# Patient Record
Sex: Female | Born: 1965
Health system: Southern US, Community
[De-identification: ages and names within clinical notes are randomized; demographics above are authoritative.]

## PROBLEM LIST (undated history)

## (undated) DIAGNOSIS — N289 Disorder of kidney and ureter, unspecified: Secondary | ICD-10-CM

## (undated) DIAGNOSIS — F32A Depression, unspecified: Secondary | ICD-10-CM

## (undated) DIAGNOSIS — R112 Nausea with vomiting, unspecified: Secondary | ICD-10-CM

## (undated) DIAGNOSIS — K649 Unspecified hemorrhoids: Secondary | ICD-10-CM

## (undated) DIAGNOSIS — I10 Essential (primary) hypertension: Secondary | ICD-10-CM

## (undated) DIAGNOSIS — C539 Malignant neoplasm of cervix uteri, unspecified: Secondary | ICD-10-CM

## (undated) DIAGNOSIS — Z9889 Other specified postprocedural states: Secondary | ICD-10-CM

## (undated) DIAGNOSIS — K219 Gastro-esophageal reflux disease without esophagitis: Secondary | ICD-10-CM

## (undated) DIAGNOSIS — K5900 Constipation, unspecified: Secondary | ICD-10-CM

## (undated) DIAGNOSIS — F329 Major depressive disorder, single episode, unspecified: Secondary | ICD-10-CM

## (undated) DIAGNOSIS — F419 Anxiety disorder, unspecified: Secondary | ICD-10-CM

## (undated) HISTORY — DX: Constipation, unspecified: K59.00

## (undated) HISTORY — PX: HERNIA REPAIR: SHX51

## (undated) HISTORY — DX: Nausea with vomiting, unspecified: R11.2

## (undated) HISTORY — PX: CHOLECYSTECTOMY: SHX55

## (undated) HISTORY — DX: Unspecified hemorrhoids: K64.9

---

## 2006-05-29 ENCOUNTER — Emergency Department: Payer: Self-pay | Admitting: Emergency Medicine

## 2008-12-30 ENCOUNTER — Emergency Department: Payer: Self-pay

## 2013-06-05 ENCOUNTER — Emergency Department: Payer: Self-pay | Admitting: Emergency Medicine

## 2013-06-05 LAB — BASIC METABOLIC PANEL
Anion Gap: 6 — ABNORMAL LOW (ref 7–16)
Calcium, Total: 8.7 mg/dL (ref 8.5–10.1)
Chloride: 110 mmol/L — ABNORMAL HIGH (ref 98–107)
Co2: 27 mmol/L (ref 21–32)
Creatinine: 0.81 mg/dL (ref 0.60–1.30)
EGFR (African American): >60
EGFR (Non-African Amer.): >60
Glucose: 115 mg/dL — ABNORMAL HIGH (ref 65–99)
Osmolality: 285 (ref 275–301)
Potassium: 3.3 mmol/L — ABNORMAL LOW (ref 3.5–5.1)
Sodium: 143 mmol/L (ref 136–145)

## 2013-06-05 LAB — CBC
HCT: 39.3 % (ref 35.0–47.0)
MCH: 26.2 pg (ref 26.0–34.0)
MCHC: 32.9 g/dL (ref 32.0–36.0)
MCV: 80 fL (ref 80–100)
Platelet: 290 10*3/uL (ref 150–440)
RBC: 4.95 10*6/uL (ref 3.80–5.20)
RDW: 14.9 % — ABNORMAL HIGH (ref 11.5–14.5)
WBC: 8.8 10*3/uL (ref 3.6–11.0)

## 2016-01-17 ENCOUNTER — Emergency Department: Payer: Self-pay

## 2016-01-17 ENCOUNTER — Encounter: Payer: Self-pay | Admitting: Emergency Medicine

## 2016-01-17 ENCOUNTER — Emergency Department
Admission: EM | Admit: 2016-01-17 | Discharge: 2016-01-17 | Disposition: A | Discharge status: Home / Self Care | Payer: Self-pay | Attending: Emergency Medicine | Admitting: Emergency Medicine

## 2016-01-17 DIAGNOSIS — J069 Acute upper respiratory infection, unspecified: Secondary | ICD-10-CM | POA: Insufficient documentation

## 2016-01-17 LAB — BASIC METABOLIC PANEL
Anion gap: 5 (ref 5–15)
BUN: 8 mg/dL (ref 6–20)
CHLORIDE: 102 mmol/L (ref 101–111)
CO2: 29 mmol/L (ref 22–32)
CREATININE: 0.68 mg/dL (ref 0.44–1.00)
Calcium: 8.9 mg/dL (ref 8.9–10.3)
GFR calc non Af Amer: >60 mL/min (ref >60)
Glucose, Bld: 95 mg/dL (ref 65–99)
POTASSIUM: 3.9 mmol/L (ref 3.5–5.1)
Sodium: 136 mmol/L (ref 135–145)

## 2016-01-17 LAB — CBC
HEMATOCRIT: 43 % (ref 35.0–47.0)
HEMOGLOBIN: 13.9 g/dL (ref 12.0–16.0)
MCH: 25.1 pg — AB (ref 26.0–34.0)
MCHC: 32.4 g/dL (ref 32.0–36.0)
MCV: 77.5 fL — AB (ref 80.0–100.0)
PLATELETS: 289 10*3/uL (ref 150–440)
RBC: 5.54 MIL/uL — AB (ref 3.80–5.20)
RDW: 14.8 % — ABNORMAL HIGH (ref 11.5–14.5)
WBC: 8.7 10*3/uL (ref 3.6–11.0)

## 2016-01-17 LAB — TROPONIN I

## 2016-01-17 MED ORDER — ACETAMINOPHEN 500 MG PO TABS
ORAL_TABLET | ORAL | Status: AC
  Filled 2016-01-17: qty 2

## 2016-01-17 MED ORDER — ACETAMINOPHEN 500 MG PO TABS
1000.0000 mg | ORAL_TABLET | Freq: Once | ORAL | Status: AC
  Administered 2016-01-17: 1000 mg via ORAL

## 2016-01-17 MED ORDER — IPRATROPIUM-ALBUTEROL 0.5-2.5 (3) MG/3ML IN SOLN
3.0000 mL | Freq: Once | RESPIRATORY_TRACT | Status: AC
  Administered 2016-01-17: 3 mL via RESPIRATORY_TRACT
  Filled 2016-01-17: qty 3

## 2016-01-17 MED ORDER — ALBUTEROL SULFATE HFA 108 (90 BASE) MCG/ACT IN AERS: 2.0000 | INHALATION_SPRAY | Freq: Four times a day (QID) | RESPIRATORY_TRACT | Status: DC | PRN

## 2016-01-17 MED ORDER — IPRATROPIUM-ALBUTEROL 0.5-2.5 (3) MG/3ML IN SOLN
RESPIRATORY_TRACT | Status: AC
  Filled 2016-01-17: qty 3

## 2016-01-17 NOTE — ED Notes (Signed)
Says she has resp illness with congestion andcough.  Says fever at home.  Lack of appetitie.  Nausea--no vomiting, just phlegm comes up and her ankles are swollen.

## 2016-01-17 NOTE — ED Provider Notes (Signed)
[facility] Emergency Department Provider Note  ____________________________________________    I have reviewed the triage vital signs and the nursing notes.   HISTORY  Chief Complaint Cough; Generalized Body Aches; and Nasal Congestion    HPI Patricia Rojas is a 50 y.o. female presents with complaints of nasal congestion and cough. Subjective fever at home. She reports fatigue and low energy. She denies chest pain. She reports her coworkers are sick with similar symptoms. No recent travel.     History reviewed. No pertinent past medical history.  There are no active problems to display for this patient.   No past surgical history on file.  Current Outpatient Rx  Name  Route  Sig  Dispense  Refill  . albuterol (PROVENTIL HFA;VENTOLIN HFA) 108 (90 Base) MCG/ACT inhaler   Inhalation   Inhale 2 puffs into the lungs every 6 (six) hours as needed for wheezing or shortness of breath.   1 Inhaler   2     Allergies Review of patient's allergies indicates no known allergies.  No family history on file.  Social History Social History  Substance Use Topics  . Smoking status: Never Smoker   . Smokeless tobacco: None  . Alcohol Use: No    Review of Systems  Constitutional: Subjective fever Eyes: Negative for discharge ENT: Negative for sore throat Cardiovascular: Negative for chest pain. Respiratory: Negative for shortness of breath. Gastrointestinal: Negative for abdominal pain, vomiting and diarrhea. Genitourinary: Negative for dysuria. Musculoskeletal: Negative for myalgias Skin: Negative for rash. Neurological: Negative for headaches      ____________________________________________   PHYSICAL EXAM:  VITAL SIGNS: ED Triage Vitals  Enc Vitals Group     BP 01/17/16 1331 223/122 mmHg     Pulse Rate 01/17/16 1331 103     Resp --      Temp 01/17/16 1331 98.4 F (36.9 C)     Temp Source 01/17/16 1331 Oral     SpO2 01/17/16 1331 95  %     Weight 01/17/16 1331 280 lb (127.007 kg)     Height 01/17/16 1331 5\' 5"  (1.651 m)     Head Cir --      Peak Flow --      Pain Score 01/17/16 1332 9     Pain Loc --      Pain Edu? --      Excl. in [place]? --      Constitutional: Alert and oriented. Well appearing and in no distress. Eyes: Conjunctivae are normal.  ENT   Head: Normocephalic and atraumatic.   Mouth/Throat: Mucous membranes are moist. Cardiovascular: Normal rate, regular rhythm. Normal and symmetric distal pulses are present in all extremities.  Respiratory: Normal respiratory effort without tachypnea nor retractions. Breath sounds are clear and equal bilaterally.  Gastrointestinal: Soft and non-tender in all quadrants. No distention. There is no CVA tenderness. Genitourinary: deferred Musculoskeletal: Nontender with normal range of motion in all extremities. No lower extremity tenderness nor edema. Neurologic:  Normal speech and language. No gross focal neurologic deficits are appreciated. Skin:  Skin is warm, dry and intact. No rash noted. Psychiatric: Mood and affect are normal. Patient exhibits appropriate insight and judgment.  ____________________________________________    LABS (pertinent positives/negatives)  Labs Reviewed  CBC - Abnormal; Notable for the following:    RBC 5.54 (*)    MCV 77.5 (*)    MCH 25.1 (*)    RDW 14.8 (*)    All other components within normal limits  BASIC METABOLIC PANEL  TROPONIN I    ____________________________________________   EKG  ED ECG REPORT I, Lavonia Drafts, the attending physician, personally viewed and interpreted this ECG.  Date: 01/17/2016 EKG Time: 1:39 PM Rate: 102 Rhythm: Sinus tachycardia QRS Axis: normal Intervals: normal ST/T Wave abnormalities: normal Conduction Disturbances: none Narrative Interpretation: unremarkable   ____________________________________________    RADIOLOGY I have personally reviewed any xrays that were  ordered on this patient: Chest x-ray unremarkable, notified patient of 9 mm nodule  ____________________________________________   PROCEDURES  Procedure(s) performed: none  Critical Care performed:none  ____________________________________________   INITIAL IMPRESSION / ASSESSMENT AND PLAN / ED COURSE  Pertinent labs & imaging results that were available during my care of the patient were reviewed by me and considered in my medical decision making (see chart for details).  Patient well-appearing and presents with symptoms of upper respiratory infection. She does have a history of hypertension. Her lab work is unremarkable. Chest x-ray is normal. Recommend symptomatic care and PCP follow-up for better blood pressure control  ____________________________________________   FINAL CLINICAL IMPRESSION(S) / ED DIAGNOSES  Final diagnoses:  Upper respiratory infection     Lavonia Drafts, MD 01/17/16 2340

## 2016-01-17 NOTE — Discharge Instructions (Signed)
Upper Respiratory Infection, Adult Most upper respiratory infections (URIs) are a viral infection of the air passages leading to the lungs. A URI affects the nose, throat, and upper air passages. The most common type of URI is nasopharyngitis and is typically referred to as "the common cold." URIs run their course and usually go away on their own. Most of the time, a URI does not require medical attention, but sometimes a bacterial infection in the upper airways can follow a viral infection. This is called a secondary infection. Sinus and middle ear infections are common types of secondary upper respiratory infections. Bacterial pneumonia can also complicate a URI. A URI can worsen asthma and chronic obstructive pulmonary disease (COPD). Sometimes, these complications can require emergency medical care and may be life threatening.  CAUSES Almost all URIs are caused by viruses. A virus is a type of germ and can spread from one person to another.  RISKS FACTORS You may be at risk for a URI if:   You smoke.   You have chronic heart or lung disease.  You have a weakened defense (immune) system.   You are very young or very old.   You have nasal allergies or asthma.  You work in crowded or poorly ventilated areas.  You work in health care facilities or schools. SIGNS AND SYMPTOMS  Symptoms typically develop 2-3 days after you come in contact with a cold virus. Most viral URIs last 7-10 days. However, viral URIs from the influenza virus (flu virus) can last 14-18 days and are typically more severe. Symptoms may include:   Runny or stuffy (congested) nose.   Sneezing.   Cough.   Sore throat.   Headache.   Fatigue.   Fever.   Loss of appetite.   Pain in your forehead, behind your eyes, and over your cheekbones (sinus pain).  Muscle aches.  DIAGNOSIS  Your health care provider may diagnose a URI by:  Physical exam.  Tests to check that your symptoms are not due to  another condition such as:  Strep throat.  Sinusitis.  Pneumonia.  Asthma. TREATMENT  A URI goes away on its own with time. It cannot be cured with medicines, but medicines may be prescribed or recommended to relieve symptoms. Medicines may help:  Reduce your fever.  Reduce your cough.  Relieve nasal congestion. HOME CARE INSTRUCTIONS   Take medicines only as directed by your health care provider.   Gargle warm saltwater or take cough drops to comfort your throat as directed by your health care provider.  Use a warm mist humidifier or inhale steam from a shower to increase air moisture. This may make it easier to breathe.  Drink enough fluid to keep your urine clear or pale yellow.   Eat soups and other clear broths and maintain good nutrition.   Rest as needed.   Return to work when your temperature has returned to normal or as your health care provider advises. You may need to stay home longer to avoid infecting others. You can also use a face mask and careful hand washing to prevent spread of the virus.  Increase the usage of your inhaler if you have asthma.   Do not use any tobacco products, including cigarettes, chewing tobacco, or electronic cigarettes. If you need help quitting, ask your health care provider. PREVENTION  The best way to protect yourself from getting a cold is to practice good hygiene.   Avoid oral or hand contact with people with cold   symptoms.   Wash your hands often if contact occurs.  There is no clear evidence that vitamin C, vitamin E, echinacea, or exercise reduces the chance of developing a cold. However, it is always recommended to get plenty of rest, exercise, and practice good nutrition.  SEEK MEDICAL CARE IF:   You are getting worse rather than better.   Your symptoms are not controlled by medicine.   You have chills.  You have worsening shortness of breath.  You have brown or red mucus.  You have yellow or brown nasal  discharge.  You have pain in your face, especially when you bend forward.  You have a fever.  You have swollen neck glands.  You have pain while swallowing.  You have white areas in the back of your throat. SEEK IMMEDIATE MEDICAL CARE IF:   You have severe or persistent:  Headache.  Ear pain.  Sinus pain.  Chest pain.  You have chronic lung disease and any of the following:  Wheezing.  Prolonged cough.  Coughing up blood.  A change in your usual mucus.  You have a stiff neck.  You have changes in your:  Vision.  Hearing.  Thinking.  Mood. MAKE SURE YOU:   Understand these instructions.  Will watch your condition.  Will get help right away if you are not doing well or get worse.   This information is not intended to replace advice given to you by your health care provider. Make sure you discuss any questions you have with your health care provider.   Document Released: 05/23/2001 Document Revised: 04/13/2015 Document Reviewed: 03/04/2014 Elsevier Interactive Patient Education 2016 [employer]  

## 2016-01-17 NOTE — ED Notes (Signed)
States she developed nasal and chest congestion last week. And also noticed some swelling to ankles ..occasional prod cough  Decreased appeitite

## 2016-12-11 DIAGNOSIS — C539 Malignant neoplasm of cervix uteri, unspecified: Secondary | ICD-10-CM

## 2016-12-11 HISTORY — DX: Malignant neoplasm of cervix uteri, unspecified: C53.9

## 2017-02-04 ENCOUNTER — Emergency Department
Admission: EM | Admit: 2017-02-04 | Discharge: 2017-02-05 | Disposition: A | Discharge status: Home / Self Care | Payer: Self-pay | Attending: Emergency Medicine | Admitting: Emergency Medicine

## 2017-02-04 DIAGNOSIS — R1030 Lower abdominal pain, unspecified: Secondary | ICD-10-CM

## 2017-02-04 DIAGNOSIS — N9489 Other specified conditions associated with female genital organs and menstrual cycle: Secondary | ICD-10-CM

## 2017-02-04 DIAGNOSIS — R110 Nausea: Secondary | ICD-10-CM | POA: Insufficient documentation

## 2017-02-04 DIAGNOSIS — K5900 Constipation, unspecified: Secondary | ICD-10-CM | POA: Insufficient documentation

## 2017-02-04 DIAGNOSIS — Z79899 Other long term (current) drug therapy: Secondary | ICD-10-CM | POA: Insufficient documentation

## 2017-02-04 LAB — URINALYSIS, ROUTINE W REFLEX MICROSCOPIC
BILIRUBIN URINE: NEGATIVE
Bacteria, UA: NONE SEEN
GLUCOSE, UA: NEGATIVE mg/dL
KETONES UR: NEGATIVE mg/dL
Nitrite: NEGATIVE
PH: 5 (ref 5.0–8.0)
Protein, ur: 30 mg/dL — AB
Specific Gravity, Urine: 1.017 (ref 1.005–1.030)

## 2017-02-04 LAB — COMPREHENSIVE METABOLIC PANEL
ALBUMIN: 3.5 g/dL (ref 3.5–5.0)
ALT: 12 U/L — ABNORMAL LOW (ref 14–54)
AST: 18 U/L (ref 15–41)
Alkaline Phosphatase: 67 U/L (ref 38–126)
Anion gap: 7 (ref 5–15)
BUN: 16 mg/dL (ref 6–20)
CHLORIDE: 105 mmol/L (ref 101–111)
CO2: 26 mmol/L (ref 22–32)
Calcium: 9.6 mg/dL (ref 8.9–10.3)
Creatinine, Ser: 1.17 mg/dL — ABNORMAL HIGH (ref 0.44–1.00)
GFR calc Af Amer: >60 mL/min (ref >60)
GFR, EST NON AFRICAN AMERICAN: 53 mL/min — AB (ref >60)
GLUCOSE: 118 mg/dL — AB (ref 65–99)
POTASSIUM: 3.9 mmol/L (ref 3.5–5.1)
Sodium: 138 mmol/L (ref 135–145)
Total Bilirubin: 0.3 mg/dL (ref 0.3–1.2)
Total Protein: 8.1 g/dL (ref 6.5–8.1)

## 2017-02-04 LAB — CBC
HCT: 31.5 % — ABNORMAL LOW (ref 35.0–47.0)
Hemoglobin: 10.7 g/dL — ABNORMAL LOW (ref 12.0–16.0)
MCH: 25.4 pg — ABNORMAL LOW (ref 26.0–34.0)
MCHC: 34 g/dL (ref 32.0–36.0)
MCV: 74.7 fL — ABNORMAL LOW (ref 80.0–100.0)
Platelets: 399 10*3/uL (ref 150–440)
RBC: 4.22 MIL/uL (ref 3.80–5.20)
RDW: 14.3 % (ref 11.5–14.5)
WBC: 17 10*3/uL — ABNORMAL HIGH (ref 3.6–11.0)

## 2017-02-04 LAB — LIPASE, BLOOD: Lipase: 20 U/L (ref 11–51)

## 2017-02-04 LAB — POCT PREGNANCY, URINE: Preg Test, Ur: NEGATIVE

## 2017-02-04 MED ORDER — ONDANSETRON HCL 4 MG/2ML IJ SOLN
4.0000 mg | Freq: Once | INTRAMUSCULAR | Status: AC
  Administered 2017-02-05: 4 mg via INTRAVENOUS
  Filled 2017-02-04: qty 2

## 2017-02-04 MED ORDER — MORPHINE SULFATE (PF) 4 MG/ML IV SOLN
4.0000 mg | Freq: Once | INTRAVENOUS | Status: AC
  Administered 2017-02-05: 4 mg via INTRAVENOUS
  Filled 2017-02-04: qty 1

## 2017-02-04 MED ORDER — IOPAMIDOL (ISOVUE-300) INJECTION 61%
30.0000 mL | Freq: Once | INTRAVENOUS | Status: AC | PRN
  Administered 2017-02-04: 30 mL via ORAL

## 2017-02-04 NOTE — ED Notes (Signed)
Patient states she is having a foul discharge present but she is unsure if it is from the rectum or vagina.  Rectal bleeding with hemorrhoids have been an issue for the past couple of days.  Attempted stool softeners last week with no relief. Patient states she used prep H and it made the pain worse.

## 2017-02-04 NOTE — ED Triage Notes (Addendum)
Pt reports constipation issues for the past 2 weeks, pt reports no hx of constipation, pt states that she is having a lot of discharge and blood, pt uncertain from which location it is coming from, pt states that her hemhhroids are also hurting her, but her lower abd is very painful, pt is tender, denies pain with urination. Pt also reports loss of approx 30lb over the past month without trying

## 2017-02-05 ENCOUNTER — Telehealth: Payer: Self-pay

## 2017-02-05 ENCOUNTER — Emergency Department: Payer: Self-pay

## 2017-02-05 ENCOUNTER — Encounter: Payer: Self-pay | Admitting: Emergency Medicine

## 2017-02-05 MED ORDER — IOPAMIDOL (ISOVUE-300) INJECTION 61%
125.0000 mL | Freq: Once | INTRAVENOUS | Status: AC | PRN
  Administered 2017-02-05: 125 mL via INTRAVENOUS

## 2017-02-05 MED ORDER — MORPHINE SULFATE (PF) 4 MG/ML IV SOLN
4.0000 mg | Freq: Once | INTRAVENOUS | Status: AC
  Administered 2017-02-05: 4 mg via INTRAVENOUS
  Filled 2017-02-05: qty 1

## 2017-02-05 MED ORDER — OXYCODONE-ACETAMINOPHEN 5-325 MG PO TABS: 1.0000 | ORAL_TABLET | Freq: Four times a day (QID) | ORAL | 0 refills | Status: DC | PRN

## 2017-02-05 MED ORDER — OXYCODONE-ACETAMINOPHEN 5-325 MG PO TABS
1.0000 | ORAL_TABLET | Freq: Once | ORAL | Status: AC
  Administered 2017-02-05: 1 via ORAL
  Filled 2017-02-05: qty 1

## 2017-02-05 MED ORDER — ONDANSETRON HCL 4 MG/2ML IJ SOLN
4.0000 mg | Freq: Once | INTRAMUSCULAR | Status: AC
  Administered 2017-02-05: 4 mg via INTRAVENOUS
  Filled 2017-02-05: qty 2

## 2017-02-05 MED ORDER — CEPHALEXIN 500 MG PO CAPS
500.0000 mg | ORAL_CAPSULE | Freq: Once | ORAL | Status: AC
  Administered 2017-02-05: 500 mg via ORAL
  Filled 2017-02-05: qty 1

## 2017-02-05 MED ORDER — ONDANSETRON 4 MG PO TBDP: 4.0000 mg | ORAL_TABLET | Freq: Three times a day (TID) | ORAL | 0 refills | Status: DC | PRN

## 2017-02-05 MED ORDER — CEPHALEXIN 500 MG PO CAPS: 500.0000 mg | ORAL_CAPSULE | Freq: Four times a day (QID) | ORAL | 0 refills | Status: DC

## 2017-02-05 NOTE — ED Notes (Signed)
Patient transported to CT 

## 2017-02-05 NOTE — ED Provider Notes (Signed)
[facility] Emergency Department Provider Note   ____________________________________________   First MD Initiated Contact with Patient 02/04/17 2313     (approximate)  I have reviewed the triage vital signs and the nursing notes.   HISTORY  Chief Complaint Abdominal Pain    HPI Patricia Rojas is a 51 y.o. female who comes into the hospital today with lower abdominal pain and discharge as well as constipation. She reports that she's been having these symptoms for 2 weeks. This started with constipation and pain and she reports that the pain is just getting worse and worse. She reports that she tried using a Preparation H suppository today because every time she tries to have a bowel movement with some blood comes out. The patient does have a history of hemorrhoids. She reports that after trying to use the Preparation H the pain intensified significantly. The patient feels that someone is hammering on her stomach. She has been taking ibuprofen but it has not helped. She reports that she's had no vomiting but has had some nausea with chills and possible fevers. She reports that she's had some smelly discharge for 2 weeks. She will stand and feel aggression and run to the bathroom and has other clear up brown mucus discharge. She is unsure if it's from her vagina or from her bottom. The patient reports her last bowel movement was 2 weeks ago when her last normal menstrual period was 2 months ago. She denies sexual activity and reports that because of her husband's medical issues she has not been sexually active in 6 months.The patient rates her pain a 10 out of 10 in intensity   History reviewed. No pertinent past medical history.  There are no active problems to display for this patient.   Past Surgical History:  Procedure Laterality Date  . CHOLECYSTECTOMY    . HERNIA REPAIR      Prior to Admission medications   Medication Sig Start Date End Date Taking?  Authorizing Provider  albuterol (PROVENTIL HFA;VENTOLIN HFA) 108 (90 Base) MCG/ACT inhaler Inhale 2 puffs into the lungs every 6 (six) hours as needed for wheezing or shortness of breath. 01/17/16   Lavonia Drafts, MD  cephALEXin (KEFLEX) 500 MG capsule Take 1 capsule (500 mg total) by mouth 4 (four) times daily. 02/05/17 02/15/17  Loney Hering, MD  ondansetron (ZOFRAN ODT) 4 MG disintegrating tablet Take 1 tablet (4 mg total) by mouth every 8 (eight) hours as needed for nausea or vomiting. 02/05/17   Loney Hering, MD  oxyCODONE-acetaminophen (ROXICET) 5-325 MG tablet Take 1 tablet by mouth every 6 (six) hours as needed. 02/05/17   Loney Hering, MD    Allergies Patient has no known allergies.  No family history on file.  Social History Social History  Substance Use Topics  . Smoking status: Never Smoker  . Smokeless tobacco: Not on file  . Alcohol use No    Review of Systems Constitutional:  fever/chills Eyes: No visual changes. ENT: No sore throat. Cardiovascular: Denies chest pain. Respiratory: Denies shortness of breath. Gastrointestinal:  abdominal pain, nausea, constipation no vomiting.  No diarrhea.   Genitourinary: vaginal discharge Musculoskeletal: Negative for back pain. Skin: Negative for rash. Neurological: Negative for headaches, focal weakness or numbness.  10-point ROS otherwise negative.  ____________________________________________   PHYSICAL EXAM:  VITAL SIGNS: ED Triage Vitals [02/04/17 2213]  Enc Vitals Group     BP (!) 149/90     Pulse Rate 89  Resp 18     Temp 99.4 F (37.4 C)     Temp Source Oral     SpO2 98 %     Weight 290 lb (131.5 kg)     Height 5\' 5"  (1.651 m)     Head Circumference      Peak Flow      Pain Score 10     Pain Loc      Pain Edu?      Excl. in [place]?     Constitutional: Alert and oriented. Well appearing and in moderate distress. Eyes: Conjunctivae are normal. PERRL. EOMI. Head: Atraumatic. Nose: No  congestion/rhinnorhea. Mouth/Throat: Mucous membranes are moist.  Oropharynx non-erythematous. Cardiovascular: Normal rate, regular rhythm. Grossly normal heart sounds.  Good peripheral circulation. Respiratory: Normal respiratory effort.  No retractions. Lungs CTAB. Gastrointestinal: Soft with left lower quadrant tenderness to palpation. No distention. Positive bowel sounds Musculoskeletal: No lower extremity tenderness nor edema.   Neurologic:  Normal speech and language.  Skin:  Skin is warm, dry and intact.  Psychiatric: Mood and affect are normal.   ____________________________________________   LABS (all labs ordered are listed, but only abnormal results are displayed)  Labs Reviewed  CBC - Abnormal; Notable for the following:       Result Value   WBC 17.0 (*)    Hemoglobin 10.7 (*)    HCT 31.5 (*)    MCV 74.7 (*)    MCH 25.4 (*)    All other components within normal limits  COMPREHENSIVE METABOLIC PANEL - Abnormal; Notable for the following:    Glucose, Bld 118 (*)    Creatinine, Ser 1.17 (*)    ALT 12 (*)    GFR calc non Af Amer 53 (*)    All other components within normal limits  URINALYSIS, ROUTINE W REFLEX MICROSCOPIC - Abnormal; Notable for the following:    Color, Urine YELLOW (*)    APPearance HAZY (*)    Hgb urine dipstick LARGE (*)    Protein, ur 30 (*)    Leukocytes, UA SMALL (*)    Squamous Epithelial / LPF 0-5 (*)    All other components within normal limits  URINE CULTURE  LIPASE, BLOOD  POCT PREGNANCY, URINE   ____________________________________________  EKG  none ____________________________________________  RADIOLOGY  CT abd and pelvis ____________________________________________   PROCEDURES  Procedure(s) performed: None  Procedures  Critical Care performed: No  ____________________________________________   INITIAL IMPRESSION / ASSESSMENT AND PLAN / ED COURSE  Pertinent labs & imaging results that were available during my  care of the patient were reviewed by me and considered in my medical decision making (see chart for details).  This is a 51 year old female who comes into the hospital today with some left lower quadrant pain as well as constipation and discharge. The patient reports that the pain is very intense. The patient does have white cell count of 17 and given her constipation and abdominal pain my concern is that she may have some diverticulitis. I will give the patient a dose of morphine and Zofran and I will send her for a CT scan. She reports that she is not fully through menopause but is going to menopause at this time. Once of given her the medication I will consider doing a pelvic exam to determine if she has some vaginal discharge.  Clinical Course as of Feb 05 223  Mon Feb 05, 2017  0058 1. Near circumferential irregular masslike density involving the lower uterus or  cervix highly concerning for primary neoplasm. Air in the cervical canal can be seen in the setting of necrosis. 2. Uterine/cervical mass causes obstruction of the distal right ureter in the pelvis with moderate hydroureteronephrosis. 3. Left external 8 mm iliac node is not enlarged by size criteria, however is indeterminate. 4. Colonic diverticulosis without diverticulitis. 5. Small nodes adjacent to the distal esophagus are of uncertain etiology and significance.   CT Abdomen Pelvis W Contrast [AW]    Clinical Course User Index [AW] Loney Hering, MD   After reviewing the results of the CT scan I decided not to do a pelvic exam. I did contact Dr. Glennon Mac her OB/GYN on-call to ask about an appropriate disposition for the patient. He states that listening to the results of the CT he is concern for possible cervical cancer. The patient needs to be seen by GYN oncology. He did give me the names of some physicians from [place] who comes here to [place] [facility] to see patients. I also did give the patient some information for [facility]  and charity care as she does not have insurance. I gave the patient a dose of Keflex because she does have too numerous to count reds and whites in her urine. I explained the results of the patient. She is tearful but understands. The patient will be discharged to home to follow-up. I informed her that she should return should she have any worsening pain or any other symptoms.  ____________________________________________   FINAL CLINICAL IMPRESSION(S) / ED DIAGNOSES  Final diagnoses:  Lower abdominal pain  Mass of female genital structure      NEW MEDICATIONS STARTED DURING THIS VISIT:  New Prescriptions   CEPHALEXIN (KEFLEX) 500 MG CAPSULE    Take 1 capsule (500 mg total) by mouth 4 (four) times daily.   ONDANSETRON (ZOFRAN ODT) 4 MG DISINTEGRATING TABLET    Take 1 tablet (4 mg total) by mouth every 8 (eight) hours as needed for nausea or vomiting.   OXYCODONE-ACETAMINOPHEN (ROXICET) 5-325 MG TABLET    Take 1 tablet by mouth every 6 (six) hours as needed.     Note:  This document was prepared using Dragon voice recognition software and may include unintentional dictation errors.    Loney Hering, MD 02/05/17 [phone]

## 2017-02-05 NOTE — Discharge Instructions (Signed)
Please follow-up with one of the OB/GYN's on the list. It is imperative that she follow-up as soon as possible. Please return with any worsening pain or any vomiting any fevers or any other concerns.

## 2017-02-05 NOTE — Telephone Encounter (Signed)
  Oncology Nurse Navigator Documentation Call placed to Patricia Rojas after reviewing ED follow up referral from Dr. Gershon Crane inbox. Per discharge summary she needs to follow up with Gyn Onc ([place]) and she was also offered [facility] referral due to not having insurance. I discussed both of these options with her and she would like to come to appointment this Wednesday, 2/28, with Dr. Fransisca Connors at 2:15p. Directions to cancer center given. Will place referral to patient services navigator in anticipation of needs. Navigator Location: [facility] (02/05/17 1000) Referral date to RadOnc/MedOnc: 02/05/17 (02/05/17 1000) )Navigator Encounter Type: Telephone;Diagnostic Results (02/05/17 1000) Telephone: Patricia Rojas Call (02/05/17 1000)                     Treatment Phase: Abnormal Scans (02/05/17 1000) Barriers/Navigation Needs: Coordination of Care (02/05/17 1000)   Interventions: Coordination of Care (02/05/17 1000)   Coordination of Care: Appts (02/05/17 1000)        Acuity: Level 2 (02/05/17 1000)   Acuity Level 2: Initial guidance, education and coordination as needed;Educational needs;Assistance expediting appointments;Ongoing guidance and education throughout treatment as needed (02/05/17 1000)     Time Spent with Patient: 30 (02/05/17 1000)

## 2017-02-06 LAB — URINE CULTURE: Culture: NO GROWTH

## 2017-02-07 ENCOUNTER — Inpatient Hospital Stay: Payer: Self-pay

## 2017-02-07 ENCOUNTER — Encounter: Payer: Self-pay | Admitting: *Deleted

## 2017-02-07 ENCOUNTER — Inpatient Hospital Stay: Payer: Self-pay | Attending: Obstetrics and Gynecology | Admitting: Obstetrics and Gynecology

## 2017-02-07 VITALS — BP 152/88 | HR 101 | Temp 97.6°F | Resp 20 | Ht 65.0 in | Wt 282.4 lb

## 2017-02-07 DIAGNOSIS — D72829 Elevated white blood cell count, unspecified: Secondary | ICD-10-CM | POA: Insufficient documentation

## 2017-02-07 DIAGNOSIS — C530 Malignant neoplasm of endocervix: Secondary | ICD-10-CM

## 2017-02-07 DIAGNOSIS — C539 Malignant neoplasm of cervix uteri, unspecified: Secondary | ICD-10-CM | POA: Insufficient documentation

## 2017-02-07 DIAGNOSIS — D390 Neoplasm of uncertain behavior of uterus: Secondary | ICD-10-CM | POA: Insufficient documentation

## 2017-02-07 DIAGNOSIS — K5900 Constipation, unspecified: Secondary | ICD-10-CM | POA: Insufficient documentation

## 2017-02-07 DIAGNOSIS — R1032 Left lower quadrant pain: Secondary | ICD-10-CM | POA: Insufficient documentation

## 2017-02-07 LAB — CBC WITH DIFFERENTIAL/PLATELET
BASOS ABS: 0.2 10*3/uL — AB (ref 0–0.1)
Basophils Relative: 1 %
EOS ABS: 3 10*3/uL — AB (ref 0–0.7)
EOS PCT: 20 %
HCT: 33.3 % — ABNORMAL LOW (ref 35.0–47.0)
HEMOGLOBIN: 11.1 g/dL — AB (ref 12.0–16.0)
LYMPHS PCT: 18 %
Lymphs Abs: 2.7 10*3/uL (ref 1.0–3.6)
MCH: 24.8 pg — ABNORMAL LOW (ref 26.0–34.0)
MCHC: 33.2 g/dL (ref 32.0–36.0)
MCV: 74.8 fL — ABNORMAL LOW (ref 80.0–100.0)
Monocytes Absolute: 0.9 10*3/uL (ref 0.2–0.9)
Monocytes Relative: 6 %
NEUTROS PCT: 55 %
Neutro Abs: 8.2 10*3/uL — ABNORMAL HIGH (ref 1.4–6.5)
PLATELETS: 461 10*3/uL — AB (ref 150–440)
RBC: 4.46 MIL/uL (ref 3.80–5.20)
RDW: 14 % (ref 11.5–14.5)
WBC: 15 10*3/uL — AB (ref 3.6–11.0)

## 2017-02-07 LAB — COMPREHENSIVE METABOLIC PANEL
ALT: 12 U/L — AB (ref 14–54)
AST: 19 U/L (ref 15–41)
Albumin: 3.8 g/dL (ref 3.5–5.0)
Alkaline Phosphatase: 79 U/L (ref 38–126)
Anion gap: 9 (ref 5–15)
BUN: 13 mg/dL (ref 6–20)
CHLORIDE: 97 mmol/L — AB (ref 101–111)
CO2: 26 mmol/L (ref 22–32)
CREATININE: 1.26 mg/dL — AB (ref 0.44–1.00)
Calcium: 9.7 mg/dL (ref 8.9–10.3)
GFR calc Af Amer: 57 mL/min — ABNORMAL LOW (ref >60)
GFR calc non Af Amer: 49 mL/min — ABNORMAL LOW (ref >60)
Glucose, Bld: 103 mg/dL — ABNORMAL HIGH (ref 65–99)
Potassium: 4 mmol/L (ref 3.5–5.1)
SODIUM: 132 mmol/L — AB (ref 135–145)
Total Bilirubin: 0.5 mg/dL (ref 0.3–1.2)
Total Protein: 8.7 g/dL — ABNORMAL HIGH (ref 6.5–8.1)

## 2017-02-07 MED ORDER — OXYCODONE-ACETAMINOPHEN 5-325 MG PO TABS: 2.0000 | ORAL_TABLET | Freq: Four times a day (QID) | ORAL | 0 refills | Status: DC | PRN

## 2017-02-07 NOTE — Progress Notes (Signed)
[place]  [phone] [phone]  ID: [name] OB: [birth year]  MR#: [id number]  [id number]  Patient Care Team: No Pcp Per Patient as PCP - General (General Practice) Clent Jacks, RN as Registered Nurse  CHIEF COMPLAINT: Squamous cell carcinoma of the cervix.  INTERVAL HISTORY: Patient is a 51 year old female who is having significant pelvic pain and pressure over the past several months. Patient was evaluated in emergency room and subsequent CT scan revealed large cervical mass consistent with cervical cancer. Patient was evaluated by gynecology oncology and biopsy confirmed squamous cell carcinoma consistent with cervical origin. Other than her pain, she feels well.She does complain of significant constipation. She has no neurologic complaints. She She has a fair appetite. She has no chest pain or shortness of breath. She denies any nausea, vomiting, or diarrhea She has no urinary complaints. Patient generally feels terrible, but offers no further specific complaints.  REVIEW OF SYSTEMS:   Review of Systems  Constitutional: Negative.  Negative for fever, malaise/fatigue and weight loss.  Respiratory: Negative.  Negative for cough and shortness of breath.   Cardiovascular: Negative.  Negative for chest pain and leg swelling.  Gastrointestinal: Positive for constipation and nausea. Negative for abdominal pain.  Genitourinary:       Significant pelvic pain.  Musculoskeletal: Negative.   Neurological: Negative.  Negative for weakness.  Psychiatric/Behavioral: The patient is nervous/anxious.     As per HPI. Otherwise, a complete review of systems is negative.  PAST MEDICAL HISTORY: Past Medical History:  Diagnosis Date  . Constipation   . Hemorrhoids   . N&V (nausea and vomiting)     PAST SURGICAL HISTORY: Past Surgical History:  Procedure Laterality Date  . CHOLECYSTECTOMY    . HERNIA REPAIR      FAMILY HISTORY: Family  History  Problem Relation Age of Onset  . Cancer Maternal Uncle   . Cancer Paternal Aunt   . Kidney cancer Neg Hx   . Bladder Cancer Neg Hx     ADVANCED DIRECTIVES (Y/N):  N  HEALTH MAINTENANCE: Social History  Substance Use Topics  . Smoking status: Never Smoker  . Smokeless tobacco: Never Used  . Alcohol use No     Colonoscopy:  PAP:  Bone density:  Lipid panel:  No Known Allergies  Current Outpatient Prescriptions  Medication Sig Dispense Refill  . cephALEXin (KEFLEX) 500 MG capsule Take 1 capsule (500 mg total) by mouth 4 (four) times daily. 40 capsule 0  . ondansetron (ZOFRAN ODT) 4 MG disintegrating tablet Take 1 tablet (4 mg total) by mouth every 8 (eight) hours as needed for nausea or vomiting. 20 tablet 0  . oxyCODONE-acetaminophen (ROXICET) 5-325 MG tablet Take 2 tablets by mouth every 6 (six) hours as needed. 30 tablet 0  . albuterol (PROVENTIL HFA;VENTOLIN HFA) 108 (90 Base) MCG/ACT inhaler Inhale 2 puffs into the lungs every 6 (six) hours as needed for wheezing or shortness of breath. (Patient not taking: Reported on 02/07/2017) 1 Inhaler 2  . fentaNYL (DURAGESIC - DOSED MCG/HR) 25 MCG/HR patch Place 1 patch (25 mcg total) onto the skin every 3 (three) days. 10 patch 0  . prochlorperazine (COMPAZINE) 10 MG tablet Take 1 tablet (10 mg total) by mouth every 6 (six) hours as needed for nausea or vomiting. 30 tablet 0   No current facility-administered medications for this visit.     OBJECTIVE: Vitals:   02/08/17 1426  BP: (!) 143/86  Pulse: 88  Resp: 18  Temp: 98.1 F (36.7 C)     Body mass index is 46.78 kg/m.    ECOG FS:1 - Symptomatic but completely ambulatory  General: Well-developed, well-nourished, no acute distress. Eyes: Pink conjunctiva, anicteric sclera. HEENT: Normocephalic, moist mucous membranes, clear oropharnyx. Lungs: Clear to auscultation bilaterally. Heart: Regular rate and rhythm. No rubs, murmurs, or gallops. Abdomen: Soft, nontender,  nondistended. No organomegaly noted, normoactive bowel sounds. Musculoskeletal: No edema, cyanosis, or clubbing. Neuro: Alert, answering all questions appropriately. Cranial nerves grossly intact. Skin: No rashes or petechiae noted. Psych: Normal affect. Lymphatics: No cervical, calvicular, axillary or inguinal LAD.   LAB RESULTS:  Lab Results  Component Value Date   NA 132 (L) 02/07/2017   K 4.0 02/07/2017   CL 97 (L) 02/07/2017   CO2 26 02/07/2017   GLUCOSE 103 (H) 02/07/2017   BUN 13 02/07/2017   CREATININE 1.26 (H) 02/07/2017   CALCIUM 9.7 02/07/2017   PROT 8.7 (H) 02/07/2017   ALBUMIN 3.8 02/07/2017   AST 19 02/07/2017   ALT 12 (L) 02/07/2017   ALKPHOS 79 02/07/2017   BILITOT 0.5 02/07/2017   GFRNONAA 49 (L) 02/07/2017   GFRAA 57 (L) 02/07/2017    Lab Results  Component Value Date   WBC 15.0 (H) 02/07/2017   NEUTROABS 8.2 (H) 02/07/2017   HGB 11.1 (L) 02/07/2017   HCT 33.3 (L) 02/07/2017   MCV 74.8 (L) 02/07/2017   PLT 461 (H) 02/07/2017     STUDIES: Ct Abdomen Pelvis W Contrast  Result Date: 02/05/2017 CLINICAL DATA:  Lower abdominal/ pelvic pain for 2 weeks. Nausea, fever. EXAM: CT ABDOMEN AND PELVIS WITH CONTRAST TECHNIQUE: Multidetector CT imaging of the abdomen and pelvis was performed using the standard protocol following bolus administration of intravenous contrast. CONTRAST:  131mL ISOVUE-300 IOPAMIDOL (ISOVUE-300) INJECTION 61% COMPARISON:  None. FINDINGS: Lower chest: The lung bases are clear. No pleural fluid or consolidation. Small paraesophageal nodes noted adjacent to the distal esophagus. Hepatobiliary: The liver is prominent size. Focal fatty infiltration adjacent to the falciform ligament. Subcapsular low-attenuation in the right lobe in the region of gallbladder fossa likely also focal fatty infiltration. Clips in the gallbladder fossa postcholecystectomy. No biliary dilatation. Pancreas: No ductal dilatation or inflammation. Spleen: Upper normal in  size without focal abnormality. Adrenals/Urinary Tract: Moderate right hydronephrosis and decreased renal perfusion. The ureter is dilated throughout with transition in the pelvis secondary to apparent cervical mass. Absent right renal excretion on delayed phase imaging. No left hydronephrosis. No urolithiasis. Urinary bladder is physiologically distended. There is mass effect from apparent uterine/cervical mass in the region of the right urinary trigone. Stomach/Bowel: Multifocal colonic diverticulosis is most prominent in the descending and sigmoid colon, no acute inflammation. Stomach is physiologically distended. No small bowel dilatation. Normal appendix. Vascular/Lymphatic: Small left external iliac node measures 8 mm short axis. There are small retroperitoneal nodes. No mesenteric or inguinal adenopathy. Small nodes adjacent to the distal esophagus in the lower thorax. Minimal atherosclerosis of the abdominal aorta without aneurysm. Reproductive: Ill-defined lower uterine/cervical mass with irregular soft tissue thickening, appears near circumferential. There is some associated low-density. Air within the cervical canal may be secondary to necrosis. Mild adjacent soft tissue stranding. Ovaries are symmetric in size. No definite pelvic free fluid. Other: Fat containing umbilical hernia. Mesh in the upper anterior abdominal wall. No free air or free fluid. Musculoskeletal: There are no acute or suspicious osseous abnormalities. No blastic or evidence of lytic lesions. IMPRESSION: 1. Near circumferential irregular masslike density involving the lower  uterus or cervix highly concerning for primary neoplasm. Air in the cervical canal can be seen in the setting of necrosis. 2. Uterine/cervical mass causes obstruction of the distal right ureter in the pelvis with moderate hydroureteronephrosis. 3. Left external 8 mm iliac node is not enlarged by size criteria, however is indeterminate. 4. Colonic diverticulosis  without diverticulitis. 5. Small nodes adjacent to the distal esophagus are of uncertain etiology and significance. Electronically Signed   By: Jeb Levering M.D.   On: 02/05/2017 00:49    ASSESSMENT: Squamous cell carcinoma of the cervix.  PLAN:    1. Squamous cell carcinoma of the cervix: CT scan results as well as pathology results reviewed independently confirming squamous cell carcinoma of the cervix. PET scan has been ordered for further evaluation and completion of the staging workup. Patient also had consultation with radiation oncology today. Plan is to do concurrent pelvic radiation along with weekly chemotherapy using cisplatin. This will then be followed by internal brachytherapy which will likely be completed at [facility]. It is unclear at this point if patient will require additional systemic chemotherapy. XRT will initiate on February 19, 2017.  Return to clinic in 1 week to discuss her PET scan results. 2. Pain: Patient was given a prescription for 25 g fentanyl patch to use along with her oxycodone as needed. 3. Nausea: Patient was given a prescription for Compazine today. 4. Constipation: Continue MiraLAX as needed.  Approximately 45 minutes was spent in discussion of which greater than 50% was consultation  Patient expressed understanding and was in agreement with this plan. She also understands that She can call clinic at any time with any questions, concerns, or complaints.   Cancer Staging No matching staging information was found for the patient.  Lloyd Huger, MD   02/12/2017 10:30 PM

## 2017-02-07 NOTE — Progress Notes (Signed)
Gynecologic Oncology Consult Visit   Referring Provider: ED  Chief Concern: Cervical mass  Subjective:  Patricia Rojas is a 51 y.o. multiparous female who is seen in consultation from ED. Had not gone through menopause and started having irregular bleeding more recently. She has not had a PAP smear in many years.  Came to the [facility] ED 02/04/17 with some left lower quadrant pain as well as constipation and discharge/bleeding. The patient reports that the pain is very intense. She also has some low grade fevers. Had white cell count of 17k. Urine culture negative. Given Rx for Keflex for possible infection, zofran for nausea and oxycodone 5 mg for pain.  Also taking Ibuprofen.    CT scan shows large cervical mass and and some right hydronephrosis.      CT scan 02/07/17 IMPRESSION: 1. Near circumferential irregular masslike density involving the lower uterus or cervix highly concerning for primary neoplasm. Air in the cervical canal can be seen in the setting of necrosis. 2. Uterine/cervical mass causes obstruction of the distal right ureter in the pelvis with moderate hydroureteronephrosis. 3. Left external 8 mm iliac node is not enlarged by size criteria, however is indeterminate. 4. Colonic diverticulosis without diverticulitis. 5. Small nodes adjacent to the distal esophagus are of uncertain etiology and significance.   Past Medical History: Past Medical History:  Diagnosis Date  . Constipation   . Hemorrhoids   . N&V (nausea and vomiting)     Past Surgical History: Past Surgical History:  Procedure Laterality Date  . CHOLECYSTECTOMY    . HERNIA REPAIR     Family History: No family history on file.  Social History: Social History   Social History  . Marital status: Single    Spouse name: N/A  . Number of children: N/A  . Years of education: N/A   Occupational History  . Not on file.   Social History Main Topics  . Smoking status: Never Smoker  . Smokeless tobacco:  Not on file  . Alcohol use No  . Drug use: Unknown  . Sexual activity: Not on file   Other Topics Concern  . Not on file   Social History Narrative  . No narrative on file    Allergies: No Known Allergies  Current Medications: Current Outpatient Prescriptions  Medication Sig Dispense Refill  . cephALEXin (KEFLEX) 500 MG capsule Take 1 capsule (500 mg total) by mouth 4 (four) times daily. 40 capsule 0  . ondansetron (ZOFRAN ODT) 4 MG disintegrating tablet Take 1 tablet (4 mg total) by mouth every 8 (eight) hours as needed for nausea or vomiting. 20 tablet 0  . oxyCODONE-acetaminophen (ROXICET) 5-325 MG tablet Take 1 tablet by mouth every 6 (six) hours as needed. 12 tablet 0  . albuterol (PROVENTIL HFA;VENTOLIN HFA) 108 (90 Base) MCG/ACT inhaler Inhale 2 puffs into the lungs every 6 (six) hours as needed for wheezing or shortness of breath. (Patient not taking: Reported on 02/07/2017) 1 Inhaler 2   No current facility-administered medications for this visit.     Review of Systems General: negative for, fatigue, changes in sleep, changes in weight or appetite Skin: negative for changes in color, texture, moles or lesions Eyes: negative for, changes in vision, pain, diplopia HEENT: negative for, change in hearing, pain, discharge, tinnitus, vertigo, voice changes, sore throat, neck masses Breasts: negative for breast lumps Pulmonary: negative for, dyspnea, orthopnea, productive cough Cardiac: negative for, palpitations, syncope, pain, discomfort, pressure Gastrointestinal: negative for, dysphagia, vomiting, jaundice, diarrhea, hematemesis,  hematochezia Genitourinary/Sexual: negative for, dysuria, hesitancy, nocturia, retention, stones, infections, STD's, incontinence Ob/Gyn: no hot flashes Musculoskeletal: negative for, pain, stiffness, swelling, range of motion limitation Hematology: No coagulation disorder, negative for, easy bruising, bleeding Neurologic/Psych: negative for,  headaches, seizures, paralysis, weakness, tremor, change in gait, change in sensation, mood swings, depression, anxiety, change in memory  Objective:  Physical Examination:  There were no vitals taken for this visit.   ECOG Performance Status: 1 - Symptomatic but completely ambulatory  General appearance: alert, cooperative and appears stated age HEENT:PERRLA, neck supple with midline trachea and thyroid without masses Lymph node survey: non-palpable, axillary, inguinal, supraclavicular Cardiovascular: regular rate and rhythm, no murmurs or gallops Respiratory: normal air entry, lungs clear to auscultation and no rales, rhonchi or wheezing Breast exam: not examined. Abdomen: obese, soft Back: inspection of back is normal Extremities: extremities normal, atraumatic, no cyanosis or edema Skin exam - normal coloration and turgor, no rashes, no suspicious skin lesions noted. Neurological exam reveals alert, oriented, normal speech, no focal findings or movement disorder noted.  Pelvic: exam chaperoned by nurse;  Vulva: normal appearing vulva with no masses, tenderness or lesions; Vagina: normal vagina; Adnexa: normal adnexa in size, nontender and no masses; Cervix: massive distention with barrel shaped tumor in canal; Uterus: not palpable, Rectal: there is thickening to the right of the cervix in the parametria.  Procedure note: patient signed informed consent for cervical biopsy.  Time out was performed.  A large piece of tumor was obtained with cervical biopsy forceps.  Monsels was applied to the cervix and good hemostatasis was obtained.  She tolerated the procedure well and we review that she may see some coffee grounds like material due to the biopsy.  Lab Review Lab Results  Component Value Date   WBC 15.0 (H) 02/07/2017   HGB 11.1 (L) 02/07/2017   HCT 33.3 (L) 02/07/2017   MCV 74.8 (L) 02/07/2017   PLT 461 (H) 02/07/2017   BMP Latest Ref Rng & Units 02/04/2017 01/17/2016 06/05/2013   Glucose 65 - 99 mg/dL 118(H) 95 115(H)  BUN 6 - 20 mg/dL 16 8 9   Creatinine 0.44 - 1.00 mg/dL 1.17(H) 0.68 0.81  Sodium 135 - 145 mmol/L 138 136 143  Potassium 3.5 - 5.1 mmol/L 3.9 3.9 3.3(L)  Chloride 101 - 111 mmol/L 105 102 110(H)  CO2 22 - 32 mmol/L 26 29 27   Calcium 8.9 - 10.3 mg/dL 9.6 8.9 8.7        Assessment:  NAELA RICHMAN is a 51 y.o. female diagnosed with large barrel shaped cervix concerning for stage IIIB cervical cancer.  CT shows the cervical mass extending into the right parametrial with some right ureteral obstruction and hydronephrosis.    She has elevated WBC and this may be due to large necrotic tumor, but also could have urinary infection, although urine culture negative.   Medical co-morbidities complicating care:  Plan:   Problem List Items Addressed This Visit    None    Visit Diagnoses    Malignant neoplasm of endocervix ([place])    -  Primary   Relevant Medications   oxyCODONE-acetaminophen (ROXICET) 5-325 MG tablet   Other Relevant Orders   Surgical pathology   CBC with Differential/Platelet (Completed)   Comprehensive metabolic panel (Completed)   Ambulatory referral to Oncology   Ambulatory referral to Radiation Oncology   Ambulatory referral to Urology      We discussed that she likely has advanced cervical cancer.  Large biopsy of cervix  was done today to confirm. There is no evidence of disease outside pelvis on CT scan.  One small indeterminate left pelvic node.  We discussed the prognosis of stage IIIB cervical cancer and that it is potentially curable with chemoradiation, but not guaranteed.  We can predict the prognosis better over the next few months as we observe the response to radiation.    We gave her prescription for more oxycodone and suggested she take two instead of one if pain not controlled.    She will see Dr Baruch Gouty in Radiation Oncology tomorrow for evaluation.  May want to consider PET scan to evaluate for distant metastases,  but CT normal.    She will need to see Medical Oncology so they can administer weekly cisplatin chemo.  She has some obstruction of right kidney and Cr is somewhat elevated.  So we have referred her to Urology for possible right ureteral stent placement.    We gave her an excuse from work for an indefinite period of time.   The patient's diagnosis, an outline of the further diagnostic and laboratory studies which will be required, the recommendation, and alternatives were discussed.  All questions were answered to the patient's satisfaction.  A total of 60 minutes were spent with the patient/family today; 50% was spent in education, counseling and coordination of care for probable cervical cancer.    [name], MD

## 2017-02-07 NOTE — Progress Notes (Signed)
Pt with lots of pain and bleeding from her vaginal area. She has been having pain for about 4 weeks and bleeding for at least 2 weeks. Using a pad an hour for bleeding. Pain 10. nasueated and zofran not working and vomiting last time this am yellow liquid.

## 2017-02-07 NOTE — Progress Notes (Signed)
  Oncology Nurse Navigator Documentation Chaperoned pelvic exam. Per Dr. Fransisca Connors she can take Miralax, one cap daily or twice a day for constipation. Appointment arranged for Dr. Baruch Gouty 3/1 at 1330 and [name] 1415 Navigator Location: [facility] (02/07/17 1500)   )Navigator Encounter Type: Initial GynOnc (02/07/17 1500)                     Patient Visit Type: GynOnc (02/07/17 1500)   Barriers/Navigation Needs: Education;Coordination of Care (02/07/17 1500) Education: Understanding Cancer/ Treatment Options (02/07/17 1500) Interventions: Coordination of Care (02/07/17 1500)   Coordination of Care: Appts (02/07/17 1500)        Acuity: Level 2 (02/07/17 1500)   Acuity Level 2: Initial guidance, education and coordination as needed;Educational needs;Assistance expediting appointments;Ongoing guidance and education throughout treatment as needed (02/07/17 1500)     Time Spent with Patient: 45 (02/07/17 1500)

## 2017-02-07 NOTE — Patient Instructions (Signed)
You can take Miralax one cap in the morning for constipation. May increase to one in the am and pm if needed.

## 2017-02-08 ENCOUNTER — Inpatient Hospital Stay: Payer: Self-pay | Attending: Oncology | Admitting: Oncology

## 2017-02-08 ENCOUNTER — Encounter: Payer: Self-pay | Admitting: Radiation Oncology

## 2017-02-08 ENCOUNTER — Ambulatory Visit
Admission: RE | Admit: 2017-02-08 | Discharge: 2017-02-08 | Disposition: A | Discharge status: Home / Self Care | Payer: Medicaid Other | Source: Ambulatory Visit | Attending: Radiation Oncology | Admitting: Radiation Oncology

## 2017-02-08 VITALS — BP 153/90 | HR 93 | Temp 98.4°F | Resp 20 | Wt 281.1 lb

## 2017-02-08 DIAGNOSIS — K5900 Constipation, unspecified: Secondary | ICD-10-CM | POA: Insufficient documentation

## 2017-02-08 DIAGNOSIS — R109 Unspecified abdominal pain: Secondary | ICD-10-CM | POA: Insufficient documentation

## 2017-02-08 DIAGNOSIS — Z7952 Long term (current) use of systemic steroids: Secondary | ICD-10-CM | POA: Insufficient documentation

## 2017-02-08 DIAGNOSIS — K5730 Diverticulosis of large intestine without perforation or abscess without bleeding: Secondary | ICD-10-CM | POA: Insufficient documentation

## 2017-02-08 DIAGNOSIS — I700 Atherosclerosis of aorta: Secondary | ICD-10-CM | POA: Insufficient documentation

## 2017-02-08 DIAGNOSIS — C55 Malignant neoplasm of uterus, part unspecified: Secondary | ICD-10-CM

## 2017-02-08 DIAGNOSIS — R102 Pelvic and perineal pain: Secondary | ICD-10-CM | POA: Insufficient documentation

## 2017-02-08 DIAGNOSIS — Z5111 Encounter for antineoplastic chemotherapy: Secondary | ICD-10-CM | POA: Insufficient documentation

## 2017-02-08 DIAGNOSIS — N131 Hydronephrosis with ureteral stricture, not elsewhere classified: Secondary | ICD-10-CM | POA: Insufficient documentation

## 2017-02-08 DIAGNOSIS — Z510 Encounter for antineoplastic radiation therapy: Secondary | ICD-10-CM | POA: Insufficient documentation

## 2017-02-08 DIAGNOSIS — N1330 Unspecified hydronephrosis: Secondary | ICD-10-CM | POA: Insufficient documentation

## 2017-02-08 DIAGNOSIS — C539 Malignant neoplasm of cervix uteri, unspecified: Secondary | ICD-10-CM | POA: Insufficient documentation

## 2017-02-08 DIAGNOSIS — C538 Malignant neoplasm of overlapping sites of cervix uteri: Secondary | ICD-10-CM | POA: Insufficient documentation

## 2017-02-08 DIAGNOSIS — R110 Nausea: Secondary | ICD-10-CM | POA: Insufficient documentation

## 2017-02-08 DIAGNOSIS — R112 Nausea with vomiting, unspecified: Secondary | ICD-10-CM | POA: Insufficient documentation

## 2017-02-08 DIAGNOSIS — C530 Malignant neoplasm of endocervix: Secondary | ICD-10-CM

## 2017-02-08 DIAGNOSIS — Z79899 Other long term (current) drug therapy: Secondary | ICD-10-CM | POA: Insufficient documentation

## 2017-02-08 DIAGNOSIS — Z809 Family history of malignant neoplasm, unspecified: Secondary | ICD-10-CM | POA: Insufficient documentation

## 2017-02-08 DIAGNOSIS — Z9049 Acquired absence of other specified parts of digestive tract: Secondary | ICD-10-CM | POA: Insufficient documentation

## 2017-02-08 DIAGNOSIS — F419 Anxiety disorder, unspecified: Secondary | ICD-10-CM | POA: Insufficient documentation

## 2017-02-08 DIAGNOSIS — K429 Umbilical hernia without obstruction or gangrene: Secondary | ICD-10-CM | POA: Insufficient documentation

## 2017-02-08 DIAGNOSIS — N135 Crossing vessel and stricture of ureter without hydronephrosis: Secondary | ICD-10-CM | POA: Insufficient documentation

## 2017-02-08 DIAGNOSIS — K649 Unspecified hemorrhoids: Secondary | ICD-10-CM | POA: Insufficient documentation

## 2017-02-08 MED ORDER — PROCHLORPERAZINE MALEATE 10 MG PO TABS
10.0000 mg | ORAL_TABLET | Freq: Four times a day (QID) | ORAL | 0 refills | Status: DC | PRN
Start: 2017-02-08 — End: 2017-02-15

## 2017-02-08 MED ORDER — FENTANYL 25 MCG/HR TD PT72: 25.0000 ug | MEDICATED_PATCH | TRANSDERMAL | 0 refills | Status: DC

## 2017-02-08 NOTE — Consult Note (Signed)
NEW PATIENT EVALUATION  Name: Patricia Rojas  MRN: [id number]  Date:   02/08/2017     DOB: December 21, 1965   This 51 y.o. female patient presents to the clinic for initial evaluation of as yet un-staged cancer of the endometrium/cervix.  REFERRING PHYSICIAN: No ref. provider found  CHIEF COMPLAINT:  Chief Complaint  Patient presents with  . Cervical Cancer    Pt is here for initial consultation of cervical cancer.      DIAGNOSIS: The encounter diagnosis was Malignant neoplasm of endocervix ([place]).   PREVIOUS INVESTIGATIONS:  CT scan reviewed Clinical notes reviewed Pathology report pending  HPI: patient is an emotional 51 year old female presented to emergency department with irregular bleeding and significant lower quadrant pain. She had a white count of 17,000 was treated Keflex empirically. On 02/07/2017 she had a CT scan showing near circumferential irregular masslike density involving the O lower uterus or cervix concerning for primary malignancy. She also had obstruction of distal right ureter in the pelvis with moderate hydronephrosis. There was left external 8 mm iliac node concerning for malignancy although indeterminate.on pelvic examination she had a massive distention with barrel-shaped tumor in the canal uterus not palpable and biopsy was obtained. Pathology report is pending at this time she is now referred to both radiation oncology and medical oncology for opinion. She's quite emotional today. Continues to have significant lower quadrant pain.she is tentatively been stage IIIB cervical carcinoma.  PLANNED TREATMENT REGIMEN: probable concurrent chemotherapy and radiation with brachytherapy as well as pelvic radiation  PAST MEDICAL HISTORY:  has a past medical history of Constipation; Hemorrhoids; and N&V (nausea and vomiting).    PAST SURGICAL HISTORY:  Past Surgical History:  Procedure Laterality Date  . CHOLECYSTECTOMY    . HERNIA REPAIR      FAMILY HISTORY: family  history includes Cancer in her maternal uncle and paternal aunt.  SOCIAL HISTORY:  reports that she has never smoked. She has never used smokeless tobacco. She reports that she uses drugs, including Marijuana. She reports that she does not drink alcohol.  ALLERGIES: Patient has no known allergies.  MEDICATIONS:  Current Outpatient Prescriptions  Medication Sig Dispense Refill  . cephALEXin (KEFLEX) 500 MG capsule Take 1 capsule (500 mg total) by mouth 4 (four) times daily. 40 capsule 0  . ondansetron (ZOFRAN ODT) 4 MG disintegrating tablet Take 1 tablet (4 mg total) by mouth every 8 (eight) hours as needed for nausea or vomiting. 20 tablet 0  . oxyCODONE-acetaminophen (ROXICET) 5-325 MG tablet Take 2 tablets by mouth every 6 (six) hours as needed. 30 tablet 0  . albuterol (PROVENTIL HFA;VENTOLIN HFA) 108 (90 Base) MCG/ACT inhaler Inhale 2 puffs into the lungs every 6 (six) hours as needed for wheezing or shortness of breath. (Patient not taking: Reported on 02/07/2017) 1 Inhaler 2  . fentaNYL (DURAGESIC - DOSED MCG/HR) 25 MCG/HR patch Place 1 patch (25 mcg total) onto the skin every 3 (three) days. 10 patch 0  . prochlorperazine (COMPAZINE) 10 MG tablet Take 1 tablet (10 mg total) by mouth every 6 (six) hours as needed for nausea or vomiting. 30 tablet 0   No current facility-administered medications for this encounter.     ECOG PERFORMANCE STATUS:  1 - Symptomatic but completely ambulatory  REVIEW OF SYSTEMS: except for the symptoms as described above with bleeding and abdominal pain  Patient denies any weight loss, fatigue, weakness, fever, chills or night sweats. Patient denies any loss of vision, blurred vision. Patient denies any  ringing  of the ears or hearing loss. No irregular heartbeat. Patient denies heart murmur or history of fainting. Patient denies any chest pain or pain radiating to her upper extremities. Patient denies any shortness of breath, difficulty breathing at night, cough  or hemoptysis. Patient denies any swelling in the lower legs. Patient denies any nausea vomiting, vomiting of blood, or coffee ground material in the vomitus. Patient denies any stomach pain. Patient states has had normal bowel movements no significant constipation or diarrhea. Patient denies any dysuria, hematuria or significant nocturia. Patient denies any problems walking, swelling in the joints or loss of balance. Patient denies any skin changes, loss of hair or loss of weight. Patient denies any excessive worrying or anxiety or significant depression. Patient denies any problems with insomnia. Patient denies excessive thirst, polyuria, polydipsia. Patient denies any swollen glands, patient denies easy bruising or easy bleeding. Patient denies any recent infections, allergies or URI. Patient "s visual fields have not changed significantly in recent time.    PHYSICAL EXAM: BP (!) 153/90   Pulse 93   Temp 98.4 F (36.9 C)   Resp 20   Wt 281 lb 1.4 oz (127.5 kg)   BMI 46.78 kg/m  Well-developed well-nourished patient in NAD. HEENT reveals PERLA, EOMI, discs not visualized.  Oral cavity is clear. No oral mucosal lesions are identified. Neck is clear without evidence of cervical or supraclavicular adenopathy. Lungs are clear to A&P. Cardiac examination is essentially unremarkable with regular rate and rhythm without murmur rub or thrill. Abdomen is benign with no organomegaly or masses noted. Motor sensory and DTR levels are equal and symmetric in the upper and lower extremities. Cranial nerves II through XII are grossly intact. Proprioception is intact. No peripheral adenopathy or edema is identified. No motor or sensory levels are noted. Crude visual fields are within normal range. Secondary to significant bleeding from her biopsy pelvic exam was not performed today  LABORATORY DATA: pathology report pending    RADIOLOGY RESULTS:CT scan reviewed and compatible with the above-stated  findings   IMPRESSION: as yet completely and staged or pathologically verified carcinoma of the and Dmitry M endocervix or he cervix in 51 year old female  PLAN: this time patient will need a PET CT scan as well s completion of her pathology. I also like [facility] on radiation oncologist to evaluate the patient for possible brachytherapy. If the treatment plan is decided on both pelvic radiation and chemotherapy followed by brachytherapy we can do her pelvic radiation in our facility. Risks and benefits of treatment including increased lower urinary tract symptoms fatigue possible diarrhea skin reaction alteration of blood counts all were discussed in detail. Patient is also being referred to urology for possible stent placement secondary to her hydronephrosis. Patient Ramser treatment plan well we'll see medical oncology this afternoon. I personally set up and ordered CT simulation for first thing next week to stay ahead of the curve and begin treatments as soon as possible.  I would like to take this opportunity to thank you for allowing me to participate in the care of your patient.Armstead Peaks., MD

## 2017-02-08 NOTE — Progress Notes (Signed)
Patient here today as new evaluation regarding cervical cancer.  Patient states she is having pain today - more toward her rectum.  States she is severely constipated.  Has not had a significant BM in about 2 weeks.  Referred by Dr. Fransisca Connors.

## 2017-02-09 ENCOUNTER — Other Ambulatory Visit: Payer: Self-pay

## 2017-02-09 NOTE — Progress Notes (Signed)
  Oncology Nurse Navigator Documentation Met with Ms. Piscopo along with [name]. She has financial concerns going forward regarding getting medications. I have entered referral for PSN and she knows to call me is she cannot afford scripts. She is also concerned regarding seeing Urology because she does not have insurance. They will being contacting her for appointment. Navigator Location: [facility] (02/09/17 0900)   )Navigator Encounter Type: Initial MedOnc;Initial RadOnc (02/09/17 0900)                     Patient Visit Type: MedOnc;RadOnc;Initial (02/09/17 0900)     Education: Pain/ Symptom Management (02/09/17 0900)                        Time Spent with Patient: 30 (02/09/17 0900)

## 2017-02-12 ENCOUNTER — Ambulatory Visit
Admission: RE | Admit: 2017-02-12 | Discharge: 2017-02-12 | Disposition: A | Discharge status: Home / Self Care | Payer: Medicaid Other | Source: Ambulatory Visit | Attending: Radiation Oncology | Admitting: Radiation Oncology

## 2017-02-12 ENCOUNTER — Other Ambulatory Visit: Payer: Self-pay

## 2017-02-12 ENCOUNTER — Ambulatory Visit (INDEPENDENT_AMBULATORY_CARE_PROVIDER_SITE_OTHER): Payer: Self-pay | Admitting: Urology

## 2017-02-12 ENCOUNTER — Encounter: Payer: Self-pay | Admitting: Urology

## 2017-02-12 VITALS — BP 153/97 | HR 90 | Ht 65.0 in | Wt 284.9 lb

## 2017-02-12 DIAGNOSIS — C539 Malignant neoplasm of cervix uteri, unspecified: Secondary | ICD-10-CM

## 2017-02-12 DIAGNOSIS — N1330 Unspecified hydronephrosis: Secondary | ICD-10-CM

## 2017-02-12 DIAGNOSIS — N179 Acute kidney failure, unspecified: Secondary | ICD-10-CM

## 2017-02-12 NOTE — Progress Notes (Signed)
Uterine - No Medical Intervention - Off Treatment.  Patient Characteristics: Endometrioid Histology, First Line - Newly Diagnosed, Medically Inoperable, Clinical Stage II AJCC T Category: TX AJCC N Category: NX AJCC M Category: M0 AJCC 8 Stage Grouping: Unknown Line of therapy: First Line - Newly Diagnosed Would you be surprised if this patient died  in the next year? I would be surprised if this patient died in the next year Patient Status: Medically Inoperable

## 2017-02-12 NOTE — Progress Notes (Signed)
02/12/2017 3:33 PM   Patricia Rojas [birth year] [id number]  Referring provider: [name], MD [facility] [place], [place] [id number]  Chief Complaint  Patient presents with  . Hydronephrosis    HPI: 51 year old female who presents today for further evaluation of right hydronephrosis. She is currently being evaluated/treated for what appears to be stage IIIB cervical cancer. She had a CT scan demonstrating cervical mass extending into the pelvis with mass effect on the right ureter causing moderate hydroureteronephrosis. She also has a left external iliac node measuring 8 mm.  She is seen Dr. Fransisca Connors, [name], and Dr. Baruch Gouty and is planned for possible ball concurrent chemotherapy/rads including both brachytherapy and pelvic XRT.   Biopsy results are still pending.  Her creatinine has been rising. One year ago, her baseline creatinine was 0.68 and his Rocephin to 1.17 8 days ago and more recently 1.26 5 days ago.  She does complain today of some right-sided flank and lower back pain over the past several weeks.   PMH: Past Medical History:  Diagnosis Date  . Constipation   . Hemorrhoids   . N&V (nausea and vomiting)     Surgical History: Past Surgical History:  Procedure Laterality Date  . CHOLECYSTECTOMY    . HERNIA REPAIR      Home Medications:  Allergies as of 02/12/2017   No Known Allergies     Medication List       Accurate as of 02/12/17  3:33 PM. Always use your most recent med list.          albuterol 108 (90 Base) MCG/ACT inhaler Commonly known as:  PROVENTIL HFA;VENTOLIN HFA Inhale 2 puffs into the lungs every 6 (six) hours as needed for wheezing or shortness of breath.   cephALEXin 500 MG capsule Commonly known as:  KEFLEX Take 1 capsule (500 mg total) by mouth 4 (four) times daily.   fentaNYL 25 MCG/HR patch Commonly known as:  DURAGESIC - dosed mcg/hr Place 1 patch (25 mcg total) onto the skin every 3 (three)  days.   ondansetron 4 MG disintegrating tablet Commonly known as:  ZOFRAN ODT Take 1 tablet (4 mg total) by mouth every 8 (eight) hours as needed for nausea or vomiting.   oxyCODONE-acetaminophen 5-325 MG tablet Commonly known as:  ROXICET Take 2 tablets by mouth every 6 (six) hours as needed.   prochlorperazine 10 MG tablet Commonly known as:  COMPAZINE Take 1 tablet (10 mg total) by mouth every 6 (six) hours as needed for nausea or vomiting.       Allergies: No Known Allergies  Family History: Family History  Problem Relation Age of Onset  . Cancer Maternal Uncle   . Cancer Paternal Aunt   . Kidney cancer Neg Hx   . Bladder Cancer Neg Hx     Social History:  reports that she has never smoked. She has never used smokeless tobacco. She reports that she uses drugs, including Marijuana. She reports that she does not drink alcohol.  ROS: UROLOGY Frequent Urination?: No Hard to postpone urination?: No Burning/pain with urination?: No Get up at night to urinate?: Yes Leakage of urine?: Yes Urine stream starts and stops?: No Trouble starting stream?: No Do you have to strain to urinate?: No Blood in urine?: No Urinary tract infection?: No Sexually transmitted disease?: No Injury to kidneys or bladder?: No Painful intercourse?: No Weak stream?: No Currently pregnant?: No Vaginal bleeding?: Yes Last menstrual period?: Nov 16 2016  Gastrointestinal Nausea?: Yes Vomiting?: Yes Indigestion/heartburn?: No Diarrhea?: No Constipation?: Yes  Constitutional Fever: Yes Night sweats?: Yes Weight loss?: Yes Fatigue?: Yes  Skin Skin rash/lesions?: No Itching?: No  Eyes Blurred vision?: No Double vision?: No  Ears/Nose/Throat Sore throat?: No Sinus problems?: No  Hematologic/Lymphatic Swollen glands?: No Easy bruising?: No  Cardiovascular Leg swelling?: No Chest pain?: No  Respiratory Cough?: No Shortness of breath?: No  Endocrine Excessive thirst?:  No  Musculoskeletal Back pain?: No Joint pain?: No  Neurological Headaches?: No Dizziness?: No  Psychologic Depression?: No Anxiety?: No  Physical Exam: BP (!) 153/97 (BP Location: Left Arm, Patient Position: Sitting, Cuff Size: Large)   Pulse 90   Ht 5\' 5"  (1.651 m)   Wt 284 lb 14.4 oz (129.2 kg)   LMP  (LMP Unknown)   BMI 47.41 kg/m   Constitutional:  Alert and oriented, No acute distress.  Tearful throughout our encounter today. HEENT: [place] AT, moist mucus membranes.  Trachea midline, no masses. Cardiovascular: No clubbing, cyanosis, or edema. Respiratory: Normal respiratory effort, no increased work of breathing. GI: Abdomen is soft, nontender, nondistended, no abdominal masses.  Morbidly obese. GU: No CVA tenderness.  Skin: No rashes, bruises or suspicious lesions. Neurologic: Grossly intact, no focal deficits, moving all 4 extremities. Psychiatric: Normal mood and affect.  Laboratory Data: Lab Results  Component Value Date   WBC 15.0 (H) 02/07/2017   HGB 11.1 (L) 02/07/2017   HCT 33.3 (L) 02/07/2017   MCV 74.8 (L) 02/07/2017   PLT 461 (H) 02/07/2017    Lab Results  Component Value Date   CREATININE 1.26 (H) 02/07/2017    Urinalysis    Component Value Date/Time   COLORURINE YELLOW (A) 02/04/2017 2218   APPEARANCEUR HAZY (A) 02/04/2017 2218   LABSPEC 1.017 02/04/2017 2218   PHURINE 5.0 02/04/2017 2218   GLUCOSEU NEGATIVE 02/04/2017 2218   HGBUR LARGE (A) 02/04/2017 2218   BILIRUBINUR NEGATIVE 02/04/2017 2218   [place] 02/04/2017 2218   PROTEINUR 30 (A) 02/04/2017 2218   NITRITE NEGATIVE 02/04/2017 2218   LEUKOCYTESUR SMALL (A) 02/04/2017 2218    Pertinent Imaging: CLINICAL DATA:  Lower abdominal/ pelvic pain for 2 weeks. Nausea, fever.  EXAM: CT ABDOMEN AND PELVIS WITH CONTRAST  TECHNIQUE: Multidetector CT imaging of the abdomen and pelvis was performed using the standard protocol following bolus administration of intravenous  contrast.  CONTRAST:  170mL ISOVUE-300 IOPAMIDOL (ISOVUE-300) INJECTION 61%  COMPARISON:  None.  FINDINGS: Lower chest: The lung bases are clear. No pleural fluid or consolidation. Small paraesophageal nodes noted adjacent to the distal esophagus.  Hepatobiliary: The liver is prominent size. Focal fatty infiltration adjacent to the falciform ligament. Subcapsular low-attenuation in the right lobe in the region of gallbladder fossa likely also focal fatty infiltration. Clips in the gallbladder fossa postcholecystectomy. No biliary dilatation.  Pancreas: No ductal dilatation or inflammation.  Spleen: Upper normal in size without focal abnormality.  Adrenals/Urinary Tract: Moderate right hydronephrosis and decreased renal perfusion. The ureter is dilated throughout with transition in the pelvis secondary to apparent cervical mass. Absent right renal excretion on delayed phase imaging. No left hydronephrosis. No urolithiasis. Urinary bladder is physiologically distended. There is mass effect from apparent uterine/cervical mass in the region of the right urinary trigone.  Stomach/Bowel: Multifocal colonic diverticulosis is most prominent in the descending and sigmoid colon, no acute inflammation. Stomach is physiologically distended. No small bowel dilatation. Normal appendix.  Vascular/Lymphatic: Small left external iliac node measures 8 mm short axis. There are  small retroperitoneal nodes. No mesenteric or inguinal adenopathy. Small nodes adjacent to the distal esophagus in the lower thorax. Minimal atherosclerosis of the abdominal aorta without aneurysm.  Reproductive: Ill-defined lower uterine/cervical mass with irregular soft tissue thickening, appears near circumferential. There is some associated low-density. Air within the cervical canal may be secondary to necrosis. Mild adjacent soft tissue stranding. Ovaries are symmetric in size. No definite pelvic free  fluid.  Other: Fat containing umbilical hernia. Mesh in the upper anterior abdominal wall. No free air or free fluid.  Musculoskeletal: There are no acute or suspicious osseous abnormalities. No blastic or evidence of lytic lesions.  IMPRESSION: 1. Near circumferential irregular masslike density involving the lower uterus or cervix highly concerning for primary neoplasm. Air in the cervical canal can be seen in the setting of necrosis. 2. Uterine/cervical mass causes obstruction of the distal right ureter in the pelvis with moderate hydroureteronephrosis. 3. Left external 8 mm iliac node is not enlarged by size criteria, however is indeterminate. 4. Colonic diverticulosis without diverticulitis. 5. Small nodes adjacent to the distal esophagus are of uncertain etiology and significance.   Electronically Signed   By: Jeb Levering M.D.   On: 02/05/2017 00:49  CT abdomen and pelvis reviewed partially today with the patient.  Assessment & Plan:    1. Hydronephrosis of right kidney Moderate to severe right hydroureteronephrosis down to the level of the pelvis secondary to obstructing tumor  Scheduled to begin cisplatin-based chemotherapy along with pelvic radiation and brachytherapy  We discussed various options today for management of her right ureteral obstruction. These include observation, ureteral stent versus percutaneous nephrostomy tube. We discussed that without intervention, she will eventually develop renal atrophy which will likely be irreversible as well as risk for infection.  Alternatives including ureteral stent versus percutaneous nephrostomy tube were discussed in order to optimize drainage and renal function. This will be important for her ability to receive platinum-based chemotherapy which is nephrotoxic.  Based on the location of the tumor, I have a high suspicion that she would fail ureteral stenting. As such, I have recommended placement of a right  percutaneous nephrostomy tube.  The risks and benefits of the percutaneous nephrostomy tube were explained in detail. We'll arrange for this to be done in interventional radiology.  I explained that she will need serial nephrostomy tube exchanges. If her cancer responds to chemotherapy/ radiation, we could perform an antegrade nephrostogram to assess for ureteral drainage and possible nephrostomy tube removal down the road.  All questions were answered. We'll see back in about 6 weeks or rate for next exchange.  2. Acute renal failure, unspecified acute renal failure type ([place]) Likely secondary to obstructing tumor  3. Cervical cancer, FIGO stage IIIB ([place]) Plan as above, final pathology still pending  Return in about 6 weeks (around 03/26/2017) for arrange for nephrostomy tube exchange.  Hollice Espy, MD  [facility] [address], [place], [place] [address] [phone]

## 2017-02-13 ENCOUNTER — Other Ambulatory Visit: Payer: Self-pay | Admitting: *Deleted

## 2017-02-13 DIAGNOSIS — Z01812 Encounter for preprocedural laboratory examination: Secondary | ICD-10-CM

## 2017-02-13 NOTE — Patient Instructions (Signed)
Cisplatin injection  What is this medicine?  CISPLATIN (SIS pla tin) is a chemotherapy drug. It targets fast dividing cells, like cancer cells, and causes these cells to die. This medicine is used to treat many types of cancer like bladder, ovarian, and testicular cancers.  This medicine may be used for other purposes; ask your health care provider or pharmacist if you have questions.  COMMON BRAND NAME(S): Platinol, Platinol -AQ  What should I tell my health care provider before I take this medicine?  They need to know if you have any of these conditions:  -blood disorders  -hearing problems  -kidney disease  -recent or ongoing radiation therapy  -an unusual or allergic reaction to cisplatin, carboplatin, other chemotherapy, other medicines, foods, dyes, or preservatives  -pregnant or trying to get pregnant  -breast-feeding  How should I use this medicine?  This drug is given as an infusion into a vein. It is administered in a hospital or clinic by a specially trained health care professional.  Talk to your pediatrician regarding the use of this medicine in children. Special care may be needed.  Overdosage: If you think you have taken too much of this medicine contact a poison control center or emergency room at once.  NOTE: This medicine is only for you. Do not share this medicine with others.  What if I miss a dose?  It is important not to miss a dose. Call your doctor or health care professional if you are unable to keep an appointment.  What may interact with this medicine?  -dofetilide  -foscarnet  -medicines for seizures  -medicines to increase blood counts like filgrastim, pegfilgrastim, sargramostim  -probenecid  -pyridoxine used with altretamine  -rituximab  -some antibiotics like amikacin, gentamicin, neomycin, polymyxin B, streptomycin, tobramycin  -sulfinpyrazone  -vaccines  -zalcitabine  Talk to your doctor or health care professional before taking any of these  medicines:  -acetaminophen  -aspirin  -ibuprofen  -ketoprofen  -naproxen  This list may not describe all possible interactions. Give your health care provider a list of all the medicines, herbs, non-prescription drugs, or dietary supplements you use. Also tell them if you smoke, drink alcohol, or use illegal drugs. Some items may interact with your medicine.  What should I watch for while using this medicine?  Your condition will be monitored carefully while you are receiving this medicine. You will need important blood work done while you are taking this medicine.  This drug may make you feel generally unwell. This is not uncommon, as chemotherapy can affect healthy cells as well as cancer cells. Report any side effects. Continue your course of treatment even though you feel ill unless your doctor tells you to stop.  In some cases, you may be given additional medicines to help with side effects. Follow all directions for their use.  Call your doctor or health care professional for advice if you get a fever, chills or sore throat, or other symptoms of a cold or flu. Do not treat yourself. This drug decreases your body's ability to fight infections. Try to avoid being around people who are sick.  This medicine may increase your risk to bruise or bleed. Call your doctor or health care professional if you notice any unusual bleeding.  Be careful brushing and flossing your teeth or using a toothpick because you may get an infection or bleed more easily. If you have any dental work done, tell your dentist you are receiving this medicine.  Avoid taking products   that contain aspirin, acetaminophen, ibuprofen, naproxen, or ketoprofen unless instructed by your doctor. These medicines may hide a fever.  Do not become pregnant while taking this medicine. Women should inform their doctor if they wish to become pregnant or think they might be pregnant. There is a potential for serious side effects to an unborn child. Talk to  your health care professional or pharmacist for more information. Do not breast-feed an infant while taking this medicine.  Drink fluids as directed while you are taking this medicine. This will help protect your kidneys.  Call your doctor or health care professional if you get diarrhea. Do not treat yourself.  What side effects may I notice from receiving this medicine?  Side effects that you should report to your doctor or health care professional as soon as possible:  -allergic reactions like skin rash, itching or hives, swelling of the face, lips, or tongue  -signs of infection - fever or chills, cough, sore throat, pain or difficulty passing urine  -signs of decreased platelets or bleeding - bruising, pinpoint red spots on the skin, black, tarry stools, nosebleeds  -signs of decreased red blood cells - unusually weak or tired, fainting spells, lightheadedness  -breathing problems  -changes in hearing  -gout pain  -low blood counts - This drug may decrease the number of white blood cells, red blood cells and platelets. You may be at increased risk for infections and bleeding.  -nausea and vomiting  -pain, swelling, redness or irritation at the injection site  -pain, tingling, numbness in the hands or feet  -problems with balance, movement  -trouble passing urine or change in the amount of urine  Side effects that usually do not require medical attention (report to your doctor or health care professional if they continue or are bothersome):  -changes in vision  -loss of appetite  -metallic taste in the mouth or changes in taste  This list may not describe all possible side effects. Call your doctor for medical advice about side effects. You may report side effects to FDA at 1-800-FDA-1088.  Where should I keep my medicine?  This drug is given in a hospital or clinic and will not be stored at home.  NOTE: This sheet is a summary. It may not cover all possible information. If you have questions about this medicine,  talk to your doctor, pharmacist, or health care provider.   2018 Elsevier/Gold Standard (2008-03-03 14:40:54)

## 2017-02-14 ENCOUNTER — Ambulatory Visit
Admission: RE | Admit: 2017-02-14 | Discharge: 2017-02-14 | Disposition: A | Discharge status: Home / Self Care | Payer: Self-pay | Source: Ambulatory Visit | Attending: Oncology | Admitting: Oncology

## 2017-02-14 ENCOUNTER — Other Ambulatory Visit: Payer: Self-pay | Admitting: *Deleted

## 2017-02-14 ENCOUNTER — Telehealth: Payer: Self-pay | Admitting: *Deleted

## 2017-02-14 ENCOUNTER — Other Ambulatory Visit
Admission: RE | Admit: 2017-02-14 | Discharge: 2017-02-14 | Disposition: A | Discharge status: Home / Self Care | Payer: Self-pay | Source: Ambulatory Visit | Attending: Oncology | Admitting: Oncology

## 2017-02-14 DIAGNOSIS — C55 Malignant neoplasm of uterus, part unspecified: Secondary | ICD-10-CM | POA: Insufficient documentation

## 2017-02-14 DIAGNOSIS — Z01812 Encounter for preprocedural laboratory examination: Secondary | ICD-10-CM

## 2017-02-14 LAB — GLUCOSE, CAPILLARY: Glucose-Capillary: 97 mg/dL (ref 65–99)

## 2017-02-14 LAB — PREGNANCY, URINE: Preg Test, Ur: NEGATIVE

## 2017-02-14 MED ORDER — ONDANSETRON HCL 8 MG PO TABS: 8.0000 mg | ORAL_TABLET | Freq: Two times a day (BID) | ORAL | 2 refills | Status: DC | PRN

## 2017-02-14 MED ORDER — PROCHLORPERAZINE MALEATE 10 MG PO TABS: 10.0000 mg | ORAL_TABLET | Freq: Four times a day (QID) | ORAL | 2 refills | Status: DC | PRN

## 2017-02-14 MED ORDER — LIDOCAINE-PRILOCAINE 2.5-2.5 % EX CREA: TOPICAL_CREAM | CUTANEOUS | 3 refills | Status: DC

## 2017-02-14 MED ORDER — FLUDEOXYGLUCOSE F - 18 (FDG) INJECTION
12.4600 | Freq: Once | INTRAVENOUS | Status: AC | PRN
  Administered 2017-02-14: 12.46 via INTRAVENOUS

## 2017-02-14 NOTE — Progress Notes (Signed)
[facility]  [phone] [phone]  ID: [name] OB: Jul 09, 1966  MR#: [id number]  SWF#:[id number]  Patient Care Team: No Pcp Per Patient as PCP - General (General Practice) Clent Jacks, RN as Registered Nurse  CHIEF COMPLAINT: Squamous cell carcinoma of the cervix.  INTERVAL HISTORY: Patient returns to clinic today for further evaluation, discussion of her PET and biopsy results, and treatment planning. She continues to have significant pelvic pain, but otherwise feels well. She has no neurologic complaints. She denies any recent fevers or illnesses. She has a fair appetite. She has no chest pain or shortness of breath. She denies any nausea, vomiting, or diarrhea. She has no urinary complaints. Patient generally feels terrible, but offers no further specific complaints.  REVIEW OF SYSTEMS:   Review of Systems  Constitutional: Negative.  Negative for fever, malaise/fatigue and weight loss.  Respiratory: Negative.  Negative for cough and shortness of breath.   Cardiovascular: Negative.  Negative for chest pain and leg swelling.  Gastrointestinal: Positive for constipation and nausea. Negative for abdominal pain.  Genitourinary:       Significant pelvic pain.  Musculoskeletal: Negative.   Neurological: Negative.  Negative for weakness.  Psychiatric/Behavioral: The patient is nervous/anxious.     As per HPI. Otherwise, a complete review of systems is negative.  PAST MEDICAL HISTORY: Past Medical History:  Diagnosis Date  . Constipation   . Hemorrhoids   . N&V (nausea and vomiting)     PAST SURGICAL HISTORY: Past Surgical History:  Procedure Laterality Date  . CHOLECYSTECTOMY    . HERNIA REPAIR      FAMILY HISTORY: Family History  Problem Relation Age of Onset  . Cancer Maternal Uncle   . Cancer Paternal Aunt   . Kidney cancer Neg Hx   . Bladder Cancer Neg Hx     ADVANCED DIRECTIVES (Y/N):  N  HEALTH  MAINTENANCE: Social History  Substance Use Topics  . Smoking status: Never Smoker  . Smokeless tobacco: Never Used  . Alcohol use No     Colonoscopy:  PAP:  Bone density:  Lipid panel:  No Known Allergies  Current Outpatient Prescriptions  Medication Sig Dispense Refill  . fentaNYL (DURAGESIC - DOSED MCG/HR) 25 MCG/HR patch Place 1 patch (25 mcg total) onto the skin every 3 (three) days. 10 patch 0  . lidocaine-prilocaine (EMLA) cream Apply to affected area once 30 g 3  . ondansetron (ZOFRAN) 8 MG tablet Take 1 tablet (8 mg total) by mouth 2 (two) times daily as needed. 60 tablet 2  . prochlorperazine (COMPAZINE) 10 MG tablet Take 1 tablet (10 mg total) by mouth every 6 (six) hours as needed (Nausea or vomiting). 60 tablet 2  . Oxycodone HCl 10 MG TABS Take 1 tablet (10 mg total) by mouth every 6 (six) hours as needed. 20 tablet 0   No current facility-administered medications for this visit.     OBJECTIVE: Vitals:   02/15/17 1333  BP: (!) 156/103  Pulse: 97  Temp: 98.5 F (36.9 C)     Body mass index is 46.87 kg/m.    ECOG FS:1 - Symptomatic but completely ambulatory  General: Well-developed, well-nourished, no acute distress. Eyes: Pink conjunctiva, anicteric sclera. Lungs: Clear to auscultation bilaterally. Heart: Regular rate and rhythm. No rubs, murmurs, or gallops. Abdomen: Soft, nontender, nondistended. No organomegaly noted, normoactive bowel sounds. Musculoskeletal: No edema, cyanosis, or clubbing. Neuro: Alert, answering all questions appropriately. Cranial nerves grossly intact. Skin: No rashes or  petechiae noted. Psych: Normal affect.   LAB RESULTS:  Lab Results  Component Value Date   NA 132 (L) 02/07/2017   K 4.0 02/07/2017   CL 97 (L) 02/07/2017   CO2 26 02/07/2017   GLUCOSE 103 (H) 02/07/2017   BUN 13 02/07/2017   CREATININE 1.26 (H) 02/07/2017   CALCIUM 9.7 02/07/2017   PROT 8.7 (H) 02/07/2017   ALBUMIN 3.8 02/07/2017   AST 19 02/07/2017    ALT 12 (L) 02/07/2017   ALKPHOS 79 02/07/2017   BILITOT 0.5 02/07/2017   GFRNONAA 49 (L) 02/07/2017   GFRAA 57 (L) 02/07/2017    Lab Results  Component Value Date   WBC 15.0 (H) 02/07/2017   NEUTROABS 8.2 (H) 02/07/2017   HGB 11.1 (L) 02/07/2017   HCT 33.3 (L) 02/07/2017   MCV 74.8 (L) 02/07/2017   PLT 461 (H) 02/07/2017     STUDIES: Ct Abdomen Pelvis W Contrast  Result Date: 02/05/2017 CLINICAL DATA:  Lower abdominal/ pelvic pain for 2 weeks. Nausea, fever. EXAM: CT ABDOMEN AND PELVIS WITH CONTRAST TECHNIQUE: Multidetector CT imaging of the abdomen and pelvis was performed using the standard protocol following bolus administration of intravenous contrast. CONTRAST:  135mL ISOVUE-300 IOPAMIDOL (ISOVUE-300) INJECTION 61% COMPARISON:  None. FINDINGS: Lower chest: The lung bases are clear. No pleural fluid or consolidation. Small paraesophageal nodes noted adjacent to the distal esophagus. Hepatobiliary: The liver is prominent size. Focal fatty infiltration adjacent to the falciform ligament. Subcapsular low-attenuation in the right lobe in the region of gallbladder fossa likely also focal fatty infiltration. Clips in the gallbladder fossa postcholecystectomy. No biliary dilatation. Pancreas: No ductal dilatation or inflammation. Spleen: Upper normal in size without focal abnormality. Adrenals/Urinary Tract: Moderate right hydronephrosis and decreased renal perfusion. The ureter is dilated throughout with transition in the pelvis secondary to apparent cervical mass. Absent right renal excretion on delayed phase imaging. No left hydronephrosis. No urolithiasis. Urinary bladder is physiologically distended. There is mass effect from apparent uterine/cervical mass in the region of the right urinary trigone. Stomach/Bowel: Multifocal colonic diverticulosis is most prominent in the descending and sigmoid colon, no acute inflammation. Stomach is physiologically distended. No small bowel dilatation.  Normal appendix. Vascular/Lymphatic: Small left external iliac node measures 8 mm short axis. There are small retroperitoneal nodes. No mesenteric or inguinal adenopathy. Small nodes adjacent to the distal esophagus in the lower thorax. Minimal atherosclerosis of the abdominal aorta without aneurysm. Reproductive: Ill-defined lower uterine/cervical mass with irregular soft tissue thickening, appears near circumferential. There is some associated low-density. Air within the cervical canal may be secondary to necrosis. Mild adjacent soft tissue stranding. Ovaries are symmetric in size. No definite pelvic free fluid. Other: Fat containing umbilical hernia. Mesh in the upper anterior abdominal wall. No free air or free fluid. Musculoskeletal: There are no acute or suspicious osseous abnormalities. No blastic or evidence of lytic lesions. IMPRESSION: 1. Near circumferential irregular masslike density involving the lower uterus or cervix highly concerning for primary neoplasm. Air in the cervical canal can be seen in the setting of necrosis. 2. Uterine/cervical mass causes obstruction of the distal right ureter in the pelvis with moderate hydroureteronephrosis. 3. Left external 8 mm iliac node is not enlarged by size criteria, however is indeterminate. 4. Colonic diverticulosis without diverticulitis. 5. Small nodes adjacent to the distal esophagus are of uncertain etiology and significance. Electronically Signed   By: Jeb Levering M.D.   On: 02/05/2017 00:49   Nm Pet Image Initial (pi) Skull Base To Thigh  Result Date: 02/14/2017 CLINICAL DATA:  Initial treatment strategy for new diagnosis of malignant neoplasm of uterus. Cervical biopsy last Wednesday. EXAM: NUCLEAR MEDICINE PET SKULL BASE TO THIGH TECHNIQUE: 12.5 mCi F-18 FDG was injected intravenously. Full-ring PET imaging was performed from the skull base to thigh after the radiotracer. CT data was obtained and used for attenuation correction and anatomic  localization. FASTING BLOOD GLUCOSE:  Value: 97 mg/dl COMPARISON:  Abdominopelvic CT of 02/05/2017. Chest radiograph of 01/17/2016. FINDINGS: Mild degradation secondary to patient body habitus. This preferentially affects the evaluation the pelvis. NECK No areas of abnormal hypermetabolism.  No cervical adenopathy. CHEST No pulmonary parenchymal poor nodal hypermetabolism. Mild cardiomegaly. Tiny hiatal hernia. ABDOMEN/PELVIS Hypermetabolism corresponding to the presumably centrally necrotic lower uterine segment/ cervical mass. This measures a S.U.V. max of 25.1, including on image 240/series 3. Hypermetabolism is identified along the course of the iliac vasculature bilaterally. This is at least partially felt to be secondary to physiologic vascular activity. However, a left external iliac node measures 12 mm and a S.U.V. max of 4.0 on image 225/ series 3, suspicious. Cholecystectomy. Other abdominopelvic findings deferred to recent diagnostic CT. Right-sided hydroureteronephrosis is mild to moderate and similar. Pelvic floor laxity. Ventral abdominal wall hernia repair with separate area of more inferior fat containing hernia identified within the pelvis. SKELETON No abnormal marrow activity. No focal osseous lesion. IMPRESSION: 1. Marked hypermetabolism corresponding to centrally necrotic lower uterine segment/cervical mass. 2. Mild degradation secondary to patient body habitus, especially involving the pelvis. 3. Although there is extensive vascular physiologic hypermetabolism involving pelvic sidewalls, at least 1 enlarged node (in the left external iliac station) is suspicious for pelvic nodal metastasis. 4. No evidence of hypermetabolic extrapelvic disease. Electronically Signed   By: Abigail Miyamoto M.D.   On: 02/14/2017 15:46    ASSESSMENT: Squamous cell carcinoma of the cervix.  PLAN:    1. Squamous cell carcinoma of the cervix: PET scan results as well as pathology results reviewed independently  confirming squamous cell carcinoma of the cervix. Patient also appears to have a nodal metastasis at the left internal iliac station. She has no obvious metastatic disease. Plan is to do concurrent pelvic radiation along with weekly chemotherapy using cisplatin. This will then be followed by internal brachytherapy which will likely be completed at [facility]. It is unclear at this point if patient will require additional systemic chemotherapy. XRT will initiate on February 19, 2017.  Return to clinic on February 20, 2017 to initiate cycle 1 of weekly cisplatin. 2. Pain: Patient has been instructed to increase her fentanyl patch to 50 g every 3 days along with oxycodone as needed. 3. Nausea: Continue Compazine as needed. 4. Constipation: Continue MiraLAX as needed.  Approximately 40 minutes was spent in discussion of which greater than 50% was consultation  Patient expressed understanding and was in agreement with this plan. She also understands that She can call clinic at any time with any questions, concerns, or complaints.   Cancer Staging Uterine cancer [facility]) Staging form: Corpus Uteri - Carcinoma and Carcinosarcoma, AJCC 8th Edition - Clinical stage from 02/18/2017: FIGO Stage IIIC1 (cT3b, cN1, cM0) - Signed by Lloyd Huger, MD on 02/18/2017   Lloyd Huger, MD   02/18/2017 7:38 PM

## 2017-02-14 NOTE — Telephone Encounter (Signed)
Cannot urinate and need order for serum pregnancy test

## 2017-02-15 ENCOUNTER — Inpatient Hospital Stay: Payer: Self-pay

## 2017-02-15 ENCOUNTER — Other Ambulatory Visit: Payer: Self-pay | Admitting: General Surgery

## 2017-02-15 ENCOUNTER — Inpatient Hospital Stay (HOSPITAL_BASED_OUTPATIENT_CLINIC_OR_DEPARTMENT_OTHER): Payer: Self-pay | Admitting: Oncology

## 2017-02-15 ENCOUNTER — Telehealth: Payer: Self-pay | Admitting: Radiology

## 2017-02-15 VITALS — BP 156/103 | HR 97 | Temp 98.5°F | Wt 281.6 lb

## 2017-02-15 DIAGNOSIS — C55 Malignant neoplasm of uterus, part unspecified: Secondary | ICD-10-CM

## 2017-02-15 DIAGNOSIS — Z7189 Other specified counseling: Secondary | ICD-10-CM

## 2017-02-15 DIAGNOSIS — K5900 Constipation, unspecified: Secondary | ICD-10-CM

## 2017-02-15 DIAGNOSIS — Z79899 Other long term (current) drug therapy: Secondary | ICD-10-CM

## 2017-02-15 DIAGNOSIS — C539 Malignant neoplasm of cervix uteri, unspecified: Secondary | ICD-10-CM

## 2017-02-15 DIAGNOSIS — R102 Pelvic and perineal pain: Secondary | ICD-10-CM

## 2017-02-15 DIAGNOSIS — K5730 Diverticulosis of large intestine without perforation or abscess without bleeding: Secondary | ICD-10-CM

## 2017-02-15 DIAGNOSIS — Z809 Family history of malignant neoplasm, unspecified: Secondary | ICD-10-CM

## 2017-02-15 DIAGNOSIS — R110 Nausea: Secondary | ICD-10-CM

## 2017-02-15 DIAGNOSIS — N135 Crossing vessel and stricture of ureter without hydronephrosis: Secondary | ICD-10-CM

## 2017-02-15 DIAGNOSIS — K429 Umbilical hernia without obstruction or gangrene: Secondary | ICD-10-CM

## 2017-02-15 DIAGNOSIS — I700 Atherosclerosis of aorta: Secondary | ICD-10-CM

## 2017-02-15 DIAGNOSIS — N1330 Unspecified hydronephrosis: Secondary | ICD-10-CM

## 2017-02-15 MED ORDER — OXYCODONE HCL ER 10 MG PO T12A: EXTENDED_RELEASE_TABLET | ORAL | 0 refills | Status: DC

## 2017-02-15 NOTE — Telephone Encounter (Signed)
Notified pt of right nephrostomy tube placement scheduled 02/16/17 but pt is unable to make that date so changed appt to 02/23/17 with arrival to [facility] registration desk at 7:30am. Advised pt to be npo after mn including medications & to have a driver present. Pt voices understanding.

## 2017-02-15 NOTE — Progress Notes (Signed)
Patient here today for follow up.  Patient c/o pain in her buttocks, 8/10.

## 2017-02-15 NOTE — Telephone Encounter (Signed)
-----   Message from Hollice Espy, MD sent at 02/12/2017  3:39 PM EST ----- Can you help arrange nephrostomy tube placement (ideally soon)

## 2017-02-15 NOTE — Telephone Encounter (Signed)
LMOM. Need to discuss nephrostomy tube placement.

## 2017-02-16 ENCOUNTER — Other Ambulatory Visit: Payer: Self-pay | Admitting: *Deleted

## 2017-02-16 ENCOUNTER — Ambulatory Visit: Admission: RE | Admit: 2017-02-16 | Payer: Self-pay | Source: Ambulatory Visit

## 2017-02-16 ENCOUNTER — Telehealth: Payer: Self-pay | Admitting: *Deleted

## 2017-02-16 MED ORDER — OXYCODONE HCL 10 MG PO TABS: 10.0000 mg | ORAL_TABLET | Freq: Four times a day (QID) | ORAL | 0 refills | Status: DC | PRN

## 2017-02-16 NOTE — Telephone Encounter (Signed)
Need clarification of Oxycodone prescription written yesterday. Long acting ordered with short acting directions. Per Dr Janese Banks will rewrite, fax then mail hard copy rx after discussing with pharmacist

## 2017-02-18 DIAGNOSIS — Z7189 Other specified counseling: Secondary | ICD-10-CM | POA: Insufficient documentation

## 2017-02-18 DIAGNOSIS — Z1211 Encounter for screening for malignant neoplasm of colon: Secondary | ICD-10-CM | POA: Insufficient documentation

## 2017-02-18 NOTE — Progress Notes (Signed)
[place]  Telephone:(336) [phone] Fax:(336) [phone]  ID: Patricia Rojas OB: 1966/03/06  MR#: [id number]  LFY#:[id number]  Patient Care Team: No Pcp Per Patient as PCP - General (General Practice) Clent Jacks, RN as Registered Nurse  CHIEF COMPLAINT: Squamous cell carcinoma of the cervix.  INTERVAL HISTORY: Patient returns to clinic today for further evaluation and initiation of cycle 1 of weekly cisplatin. She has initiated her XRT. She continues to have significant pelvic pain and nausea, but otherwise feels well. She has no neurologic complaints. She denies any recent fevers or illnesses. She has a fair appetite. She has no chest pain or shortness of breath. She denies any vomiting, or diarrhea. She has no urinary complaints. Patient generally feels terrible, but offers no further specific complaints.  REVIEW OF SYSTEMS:   Review of Systems  Constitutional: Negative.  Negative for fever, malaise/fatigue and weight loss.  Respiratory: Negative.  Negative for cough and shortness of breath.   Cardiovascular: Negative.  Negative for chest pain and leg swelling.  Gastrointestinal: Positive for constipation and nausea. Negative for abdominal pain.  Genitourinary:       Significant pelvic pain.  Musculoskeletal: Negative.   Neurological: Negative.  Negative for weakness.  Psychiatric/Behavioral: The patient is nervous/anxious.     As per HPI. Otherwise, a complete review of systems is negative.  PAST MEDICAL HISTORY: Past Medical History:  Diagnosis Date  . Constipation   . Hemorrhoids   . N&V (nausea and vomiting)     PAST SURGICAL HISTORY: Past Surgical History:  Procedure Laterality Date  . CHOLECYSTECTOMY    . HERNIA REPAIR      FAMILY HISTORY: Family History  Problem Relation Age of Onset  . Cancer Maternal Uncle   . Cancer Paternal Aunt   . Kidney cancer Neg Hx   . Bladder Cancer Neg Hx     ADVANCED DIRECTIVES (Y/N):  N  HEALTH  MAINTENANCE: Social History  Substance Use Topics  . Smoking status: Never Smoker  . Smokeless tobacco: Never Used  . Alcohol use No     Colonoscopy:  PAP:  Bone density:  Lipid panel:  No Known Allergies  Current Outpatient Prescriptions  Medication Sig Dispense Refill  . fentaNYL (DURAGESIC - DOSED MCG/HR) 25 MCG/HR patch Place 1 patch (25 mcg total) onto the skin every 3 (three) days. 10 patch 0  . lidocaine-prilocaine (EMLA) cream Apply to affected area once 30 g 3  . ondansetron (ZOFRAN) 8 MG tablet Take 1 tablet (8 mg total) by mouth 2 (two) times daily as needed. 60 tablet 2  . Oxycodone HCl 10 MG TABS Take 1 tablet (10 mg total) by mouth every 6 (six) hours as needed. 20 tablet 0  . prochlorperazine (COMPAZINE) 10 MG tablet Take 1 tablet (10 mg total) by mouth every 6 (six) hours as needed (Nausea or vomiting). 60 tablet 2  . ALPRAZolam (XANAX) 0.25 MG tablet Take 1-2 tabs daily as needed 60 tablet 0  . dexamethasone (DECADRON) 4 MG tablet Take 1 tablet (4 mg total) by mouth 2 (two) times daily with a meal. 30 tablet 1   No current facility-administered medications for this visit.     OBJECTIVE: Vitals:   02/20/17 1025  Resp: 18  Temp: (!) 96.6 F (35.9 C)     Body mass index is 46.08 kg/m.    ECOG FS:1 - Symptomatic but completely ambulatory  General: Well-developed, well-nourished, no acute distress. Eyes: Pink conjunctiva, anicteric sclera. Lungs: Clear to  auscultation bilaterally. Heart: Regular rate and rhythm. No rubs, murmurs, or gallops. Abdomen: Soft, nontender, nondistended. No organomegaly noted, normoactive bowel sounds. Musculoskeletal: No edema, cyanosis, or clubbing. Neuro: Alert, answering all questions appropriately. Cranial nerves grossly intact. Skin: No rashes or petechiae noted. Psych: Normal affect.   LAB RESULTS:  Lab Results  Component Value Date   NA 134 (L) 02/20/2017   K 3.9 02/20/2017   CL 99 (L) 02/20/2017   CO2 28 02/20/2017    GLUCOSE 118 (H) 02/20/2017   BUN 13 02/20/2017   CREATININE 1.44 (H) 02/20/2017   CALCIUM 10.1 02/20/2017   PROT 8.7 (H) 02/07/2017   ALBUMIN 3.8 02/07/2017   AST 19 02/07/2017   ALT 12 (L) 02/07/2017   ALKPHOS 79 02/07/2017   BILITOT 0.5 02/07/2017   GFRNONAA 42 (L) 02/20/2017   GFRAA 48 (L) 02/20/2017    Lab Results  Component Value Date   WBC 13.4 (H) 02/20/2017   NEUTROABS 7.9 (H) 02/20/2017   HGB 10.3 (L) 02/20/2017   HCT 31.3 (L) 02/20/2017   MCV 74.7 (L) 02/20/2017   PLT 398 02/20/2017     STUDIES: Ct Abdomen Pelvis W Contrast  Result Date: 02/05/2017 CLINICAL DATA:  Lower abdominal/ pelvic pain for 2 weeks. Nausea, fever. EXAM: CT ABDOMEN AND PELVIS WITH CONTRAST TECHNIQUE: Multidetector CT imaging of the abdomen and pelvis was performed using the standard protocol following bolus administration of intravenous contrast. CONTRAST:  137mL ISOVUE-300 IOPAMIDOL (ISOVUE-300) INJECTION 61% COMPARISON:  None. FINDINGS: Lower chest: The lung bases are clear. No pleural fluid or consolidation. Small paraesophageal nodes noted adjacent to the distal esophagus. Hepatobiliary: The liver is prominent size. Focal fatty infiltration adjacent to the falciform ligament. Subcapsular low-attenuation in the right lobe in the region of gallbladder fossa likely also focal fatty infiltration. Clips in the gallbladder fossa postcholecystectomy. No biliary dilatation. Pancreas: No ductal dilatation or inflammation. Spleen: Upper normal in size without focal abnormality. Adrenals/Urinary Tract: Moderate right hydronephrosis and decreased renal perfusion. The ureter is dilated throughout with transition in the pelvis secondary to apparent cervical mass. Absent right renal excretion on delayed phase imaging. No left hydronephrosis. No urolithiasis. Urinary bladder is physiologically distended. There is mass effect from apparent uterine/cervical mass in the region of the right urinary trigone.  Stomach/Bowel: Multifocal colonic diverticulosis is most prominent in the descending and sigmoid colon, no acute inflammation. Stomach is physiologically distended. No small bowel dilatation. Normal appendix. Vascular/Lymphatic: Small left external iliac node measures 8 mm short axis. There are small retroperitoneal nodes. No mesenteric or inguinal adenopathy. Small nodes adjacent to the distal esophagus in the lower thorax. Minimal atherosclerosis of the abdominal aorta without aneurysm. Reproductive: Ill-defined lower uterine/cervical mass with irregular soft tissue thickening, appears near circumferential. There is some associated low-density. Air within the cervical canal may be secondary to necrosis. Mild adjacent soft tissue stranding. Ovaries are symmetric in size. No definite pelvic free fluid. Other: Fat containing umbilical hernia. Mesh in the upper anterior abdominal wall. No free air or free fluid. Musculoskeletal: There are no acute or suspicious osseous abnormalities. No blastic or evidence of lytic lesions. IMPRESSION: 1. Near circumferential irregular masslike density involving the lower uterus or cervix highly concerning for primary neoplasm. Air in the cervical canal can be seen in the setting of necrosis. 2. Uterine/cervical mass causes obstruction of the distal right ureter in the pelvis with moderate hydroureteronephrosis. 3. Left external 8 mm iliac node is not enlarged by size criteria, however is indeterminate. 4. Colonic diverticulosis  without diverticulitis. 5. Small nodes adjacent to the distal esophagus are of uncertain etiology and significance. Electronically Signed   By: Jeb Levering M.D.   On: 02/05/2017 00:49   Nm Pet Image Initial (pi) Skull Base To Thigh  Result Date: 02/14/2017 CLINICAL DATA:  Initial treatment strategy for new diagnosis of malignant neoplasm of uterus. Cervical biopsy last Wednesday. EXAM: NUCLEAR MEDICINE PET SKULL BASE TO THIGH TECHNIQUE: 12.5 mCi F-18  FDG was injected intravenously. Full-ring PET imaging was performed from the skull base to thigh after the radiotracer. CT data was obtained and used for attenuation correction and anatomic localization. FASTING BLOOD GLUCOSE:  Value: 97 mg/dl COMPARISON:  Abdominopelvic CT of 02/05/2017. Chest radiograph of 01/17/2016. FINDINGS: Mild degradation secondary to patient body habitus. This preferentially affects the evaluation the pelvis. NECK No areas of abnormal hypermetabolism.  No cervical adenopathy. CHEST No pulmonary parenchymal poor nodal hypermetabolism. Mild cardiomegaly. Tiny hiatal hernia. ABDOMEN/PELVIS Hypermetabolism corresponding to the presumably centrally necrotic lower uterine segment/ cervical mass. This measures a S.U.V. max of 25.1, including on image 240/series 3. Hypermetabolism is identified along the course of the iliac vasculature bilaterally. This is at least partially felt to be secondary to physiologic vascular activity. However, a left external iliac node measures 12 mm and a S.U.V. max of 4.0 on image 225/ series 3, suspicious. Cholecystectomy. Other abdominopelvic findings deferred to recent diagnostic CT. Right-sided hydroureteronephrosis is mild to moderate and similar. Pelvic floor laxity. Ventral abdominal wall hernia repair with separate area of more inferior fat containing hernia identified within the pelvis. SKELETON No abnormal marrow activity. No focal osseous lesion. IMPRESSION: 1. Marked hypermetabolism corresponding to centrally necrotic lower uterine segment/cervical mass. 2. Mild degradation secondary to patient body habitus, especially involving the pelvis. 3. Although there is extensive vascular physiologic hypermetabolism involving pelvic sidewalls, at least 1 enlarged node (in the left external iliac station) is suspicious for pelvic nodal metastasis. 4. No evidence of hypermetabolic extrapelvic disease. Electronically Signed   By: Abigail Miyamoto M.D.   On: 02/14/2017  15:46    ASSESSMENT: Squamous cell carcinoma of the cervix.  PLAN:    1. Squamous cell carcinoma of the cervix: PET scan results as well as pathology results reviewed independently confirming squamous cell carcinoma of the cervix. Patient also appears to have a nodal metastasis at the left internal iliac station. She has no obvious metastatic disease. Plan is to do concurrent pelvic radiation along with weekly chemotherapy using cisplatin. This will then be followed by internal brachytherapy which will likely be [facility] at [facility]. Continue daily XRT. Proceed with cycle 1 of weekly cisplatin. Return to clinic in 1 week for consideration of cycle 2.   2. Pain: Continue fentanyl patch to 50 g every 3 days along with oxycodone as needed. Patient was also given a prescription for dexamethasone today. 3. Nausea: Continue Compazine as needed, dexamethasone as above. 4. Constipation: Continue MiraLAX as needed. 5. Anxiety: Patient was given a prescription for Xanax today.  Approximately 30 minutes was spent in discussion of which greater than 50% was consultation  Patient expressed understanding and was in agreement with this plan. She also understands that She can call clinic at any time with any questions, concerns, or complaints.   Cancer Staging Uterine cancer [facility]) Staging form: Corpus Uteri - Carcinoma and Carcinosarcoma, AJCC 8th Edition - Clinical stage from 02/18/2017: FIGO Stage IIIC1 (cT3b, cN1, cM0) - Signed by Lloyd Huger, MD on 02/18/2017   Lloyd Huger, MD  02/22/2017 8:39 AM

## 2017-02-19 ENCOUNTER — Ambulatory Visit
Admission: RE | Admit: 2017-02-19 | Discharge: 2017-02-19 | Disposition: A | Discharge status: Home / Self Care | Payer: Medicaid Other | Source: Ambulatory Visit | Attending: Radiation Oncology | Admitting: Radiation Oncology

## 2017-02-20 ENCOUNTER — Inpatient Hospital Stay (HOSPITAL_BASED_OUTPATIENT_CLINIC_OR_DEPARTMENT_OTHER): Payer: Self-pay | Admitting: Oncology

## 2017-02-20 ENCOUNTER — Inpatient Hospital Stay: Payer: Self-pay

## 2017-02-20 ENCOUNTER — Ambulatory Visit
Admission: RE | Admit: 2017-02-20 | Discharge: 2017-02-20 | Disposition: A | Discharge status: Home / Self Care | Payer: Medicaid Other | Source: Ambulatory Visit | Attending: Radiation Oncology | Admitting: Radiation Oncology

## 2017-02-20 VITALS — Temp 96.6°F | Resp 18 | Wt 276.9 lb

## 2017-02-20 VITALS — BP 139/90 | HR 84

## 2017-02-20 DIAGNOSIS — N135 Crossing vessel and stricture of ureter without hydronephrosis: Secondary | ICD-10-CM

## 2017-02-20 DIAGNOSIS — C538 Malignant neoplasm of overlapping sites of cervix uteri: Secondary | ICD-10-CM

## 2017-02-20 DIAGNOSIS — Z809 Family history of malignant neoplasm, unspecified: Secondary | ICD-10-CM

## 2017-02-20 DIAGNOSIS — I700 Atherosclerosis of aorta: Secondary | ICD-10-CM

## 2017-02-20 DIAGNOSIS — N1330 Unspecified hydronephrosis: Secondary | ICD-10-CM

## 2017-02-20 DIAGNOSIS — K429 Umbilical hernia without obstruction or gangrene: Secondary | ICD-10-CM

## 2017-02-20 DIAGNOSIS — R102 Pelvic and perineal pain: Secondary | ICD-10-CM

## 2017-02-20 DIAGNOSIS — C55 Malignant neoplasm of uterus, part unspecified: Secondary | ICD-10-CM

## 2017-02-20 DIAGNOSIS — K5730 Diverticulosis of large intestine without perforation or abscess without bleeding: Secondary | ICD-10-CM

## 2017-02-20 DIAGNOSIS — C539 Malignant neoplasm of cervix uteri, unspecified: Secondary | ICD-10-CM

## 2017-02-20 DIAGNOSIS — R110 Nausea: Secondary | ICD-10-CM

## 2017-02-20 DIAGNOSIS — Z01812 Encounter for preprocedural laboratory examination: Secondary | ICD-10-CM

## 2017-02-20 DIAGNOSIS — F419 Anxiety disorder, unspecified: Secondary | ICD-10-CM

## 2017-02-20 DIAGNOSIS — K5900 Constipation, unspecified: Secondary | ICD-10-CM

## 2017-02-20 DIAGNOSIS — Z79899 Other long term (current) drug therapy: Secondary | ICD-10-CM

## 2017-02-20 LAB — HCG, QUANTITATIVE, PREGNANCY: HCG, BETA CHAIN, QUANT, S: 7 m[IU]/mL — AB (ref <5)

## 2017-02-20 LAB — CBC WITH DIFFERENTIAL/PLATELET
BASOS PCT: 1 %
Basophils Absolute: 0.1 10*3/uL (ref 0–0.1)
EOS ABS: 2.9 10*3/uL — AB (ref 0–0.7)
Eosinophils Relative: 22 %
HCT: 31.3 % — ABNORMAL LOW (ref 35.0–47.0)
HEMOGLOBIN: 10.3 g/dL — AB (ref 12.0–16.0)
LYMPHS ABS: 1.5 10*3/uL (ref 1.0–3.6)
Lymphocytes Relative: 12 %
MCH: 24.6 pg — AB (ref 26.0–34.0)
MCHC: 32.9 g/dL (ref 32.0–36.0)
MCV: 74.7 fL — ABNORMAL LOW (ref 80.0–100.0)
MONO ABS: 0.9 10*3/uL (ref 0.2–0.9)
MONOS PCT: 7 %
NEUTROS PCT: 58 %
Neutro Abs: 7.9 10*3/uL — ABNORMAL HIGH (ref 1.4–6.5)
Platelets: 398 10*3/uL (ref 150–440)
RBC: 4.18 MIL/uL (ref 3.80–5.20)
RDW: 14.8 % — AB (ref 11.5–14.5)
WBC: 13.4 10*3/uL — ABNORMAL HIGH (ref 3.6–11.0)

## 2017-02-20 LAB — BASIC METABOLIC PANEL
Anion gap: 7 (ref 5–15)
BUN: 13 mg/dL (ref 6–20)
CALCIUM: 10.1 mg/dL (ref 8.9–10.3)
CHLORIDE: 99 mmol/L — AB (ref 101–111)
CO2: 28 mmol/L (ref 22–32)
Creatinine, Ser: 1.44 mg/dL — ABNORMAL HIGH (ref 0.44–1.00)
GFR calc Af Amer: 48 mL/min — ABNORMAL LOW (ref >60)
GFR calc non Af Amer: 42 mL/min — ABNORMAL LOW (ref >60)
Glucose, Bld: 118 mg/dL — ABNORMAL HIGH (ref 65–99)
Potassium: 3.9 mmol/L (ref 3.5–5.1)
SODIUM: 134 mmol/L — AB (ref 135–145)

## 2017-02-20 MED ORDER — SODIUM CHLORIDE 0.9 % IV SOLN
Freq: Once | INTRAVENOUS | Status: AC
  Administered 2017-02-20: 13:00:00 via INTRAVENOUS
  Filled 2017-02-20: qty 5

## 2017-02-20 MED ORDER — SODIUM CHLORIDE 0.9 % IV SOLN
Freq: Once | INTRAVENOUS | Status: AC
  Administered 2017-02-20: 11:00:00 via INTRAVENOUS
  Filled 2017-02-20: qty 1000

## 2017-02-20 MED ORDER — ALPRAZOLAM 0.25 MG PO TABS: ORAL_TABLET | ORAL | 0 refills | Status: DC

## 2017-02-20 MED ORDER — PALONOSETRON HCL INJECTION 0.25 MG/5ML
0.2500 mg | Freq: Once | INTRAVENOUS | Status: AC
  Administered 2017-02-20: 0.25 mg via INTRAVENOUS
  Filled 2017-02-20: qty 5

## 2017-02-20 MED ORDER — POTASSIUM CHLORIDE 2 MEQ/ML IV SOLN
Freq: Once | INTRAVENOUS | Status: AC
  Administered 2017-02-20: 11:00:00 via INTRAVENOUS
  Filled 2017-02-20: qty 1000

## 2017-02-20 MED ORDER — DEXAMETHASONE 4 MG PO TABS: 4.0000 mg | ORAL_TABLET | Freq: Two times a day (BID) | ORAL | 1 refills | Status: DC

## 2017-02-20 MED ORDER — CISPLATIN CHEMO INJECTION 100MG/100ML
40.0000 mg/m2 | Freq: Once | INTRAVENOUS | Status: AC
  Administered 2017-02-20: 97 mg via INTRAVENOUS
  Filled 2017-02-20: qty 97

## 2017-02-20 NOTE — Progress Notes (Signed)
Complains of persistent nausea despite taking antiemetics.

## 2017-02-21 ENCOUNTER — Ambulatory Visit
Admission: RE | Admit: 2017-02-21 | Discharge: 2017-02-21 | Disposition: A | Discharge status: Home / Self Care | Payer: Medicaid Other | Source: Ambulatory Visit | Attending: Radiation Oncology | Admitting: Radiation Oncology

## 2017-02-22 ENCOUNTER — Telehealth: Payer: Self-pay

## 2017-02-22 ENCOUNTER — Other Ambulatory Visit: Payer: Self-pay | Admitting: Radiology

## 2017-02-22 ENCOUNTER — Ambulatory Visit
Admission: RE | Admit: 2017-02-22 | Discharge: 2017-02-22 | Disposition: A | Discharge status: Home / Self Care | Payer: Medicaid Other | Source: Ambulatory Visit | Attending: Radiation Oncology | Admitting: Radiation Oncology

## 2017-02-22 ENCOUNTER — Other Ambulatory Visit: Payer: Self-pay

## 2017-02-22 MED ORDER — ALPRAZOLAM 0.25 MG PO TABS: ORAL_TABLET | ORAL | 0 refills | Status: DC

## 2017-02-22 NOTE — Telephone Encounter (Signed)
  Oncology Nurse Navigator Documentation Voicemail left from Ms. Rouillard. She is starting to have financial difficulty with obtaining her medication and other things. She has now spoken with PSN Barnabas Lister and will meet with him. I attempted to return her call with no answer. Navigator Location: [facility] (02/22/17 1600)   )Navigator Encounter Type: Telephone (02/22/17 1600) Telephone: [place] Call (02/22/17 1600)                                                  Time Spent with Patient: 15 (02/22/17 1600)

## 2017-02-23 ENCOUNTER — Ambulatory Visit: Payer: Medicaid Other

## 2017-02-23 ENCOUNTER — Ambulatory Visit
Admission: RE | Admit: 2017-02-23 | Discharge: 2017-02-23 | Disposition: A | Discharge status: Home / Self Care | Payer: Self-pay | Source: Ambulatory Visit | Attending: Urology | Admitting: Urology

## 2017-02-23 DIAGNOSIS — N1330 Unspecified hydronephrosis: Secondary | ICD-10-CM | POA: Insufficient documentation

## 2017-02-23 HISTORY — PX: IR GENERIC HISTORICAL: IMG1180011

## 2017-02-23 HISTORY — DX: Other specified postprocedural states: Z98.890

## 2017-02-23 HISTORY — DX: Nausea with vomiting, unspecified: R11.2

## 2017-02-23 LAB — CBC
HEMATOCRIT: 34.2 % — AB (ref 35.0–47.0)
HEMOGLOBIN: 11.3 g/dL — AB (ref 12.0–16.0)
MCH: 25 pg — AB (ref 26.0–34.0)
MCHC: 33.2 g/dL (ref 32.0–36.0)
MCV: 75.5 fL — AB (ref 80.0–100.0)
Platelets: 510 10*3/uL — ABNORMAL HIGH (ref 150–440)
RBC: 4.53 MIL/uL (ref 3.80–5.20)
RDW: 15.2 % — ABNORMAL HIGH (ref 11.5–14.5)
WBC: 15.2 10*3/uL — ABNORMAL HIGH (ref 3.6–11.0)

## 2017-02-23 LAB — PROTIME-INR
INR: 0.99
PROTHROMBIN TIME: 13.1 s (ref 11.4–15.2)

## 2017-02-23 LAB — APTT: aPTT: 25 seconds (ref 24–36)

## 2017-02-23 LAB — PREGNANCY, URINE: PREG TEST UR: NEGATIVE

## 2017-02-23 MED ORDER — ONDANSETRON 4 MG PO TBDP
ORAL_TABLET | ORAL | Status: AC
  Administered 2017-02-23: 10:00:00 4 mg via ORAL
  Filled 2017-02-23: qty 1

## 2017-02-23 MED ORDER — IOPAMIDOL (ISOVUE-300) INJECTION 61%
30.0000 mL | Freq: Once | INTRAVENOUS | Status: AC | PRN
  Administered 2017-02-23: 09:00:00 15 mL via INTRAVENOUS

## 2017-02-23 MED ORDER — FENTANYL CITRATE (PF) 100 MCG/2ML IJ SOLN
INTRAMUSCULAR | Status: AC | PRN
  Administered 2017-02-23: 50 ug via INTRAVENOUS

## 2017-02-23 MED ORDER — SODIUM CHLORIDE 0.9 % IV SOLN
INTRAVENOUS | Status: DC
  Administered 2017-02-23: 08:00:00 via INTRAVENOUS

## 2017-02-23 MED ORDER — CEFAZOLIN SODIUM-DEXTROSE 2-3 GM-% IV SOLR
2.0000 g | INTRAVENOUS | Status: AC
  Administered 2017-02-23: 2 g via INTRAVENOUS
  Filled 2017-02-23: qty 50

## 2017-02-23 MED ORDER — LIDOCAINE HCL (PF) 1 % IJ SOLN
INTRAMUSCULAR | Status: AC | PRN
  Administered 2017-02-23: 10 mL

## 2017-02-23 MED ORDER — HYDROCODONE-ACETAMINOPHEN 5-325 MG PO TABS
ORAL_TABLET | ORAL | Status: AC
  Administered 2017-02-23: 2 via ORAL
  Filled 2017-02-23: qty 2

## 2017-02-23 MED ORDER — SODIUM CHLORIDE FLUSH 0.9 % IV SOLN
INTRAVENOUS | Status: AC
  Filled 2017-02-23: qty 10

## 2017-02-23 MED ORDER — ONDANSETRON HCL 4 MG/2ML IJ SOLN
INTRAMUSCULAR | Status: AC
  Filled 2017-02-23: qty 2

## 2017-02-23 MED ORDER — ONDANSETRON HCL 4 MG/2ML IJ SOLN
4.0000 mg | Freq: Once | INTRAMUSCULAR | Status: AC
  Administered 2017-02-23: 4 mg via INTRAVENOUS

## 2017-02-23 MED ORDER — HYDROCODONE-ACETAMINOPHEN 5-325 MG PO TABS
1.0000 | ORAL_TABLET | ORAL | Status: DC | PRN
  Administered 2017-02-23: 2 via ORAL

## 2017-02-23 MED ORDER — LIDOCAINE HCL (PF) 1 % IJ SOLN
INTRAMUSCULAR | Status: AC
Start: 2017-02-23 — End: 2017-02-23
  Filled 2017-02-23: qty 30

## 2017-02-23 MED ORDER — CEFAZOLIN SODIUM-DEXTROSE 2-4 GM/100ML-% IV SOLN: 2.0000 g | INTRAVENOUS | Status: DC

## 2017-02-23 MED ORDER — MIDAZOLAM HCL 5 MG/5ML IJ SOLN
INTRAMUSCULAR | Status: AC | PRN
  Administered 2017-02-23: 0.5 mg via INTRAVENOUS
  Administered 2017-02-23: 1 mg via INTRAVENOUS

## 2017-02-23 MED ORDER — ONDANSETRON 4 MG PO TBDP
4.0000 mg | ORAL_TABLET | Freq: Once | ORAL | Status: AC
Start: 2017-02-23 — End: 2017-02-23
  Administered 2017-02-23: 4 mg via ORAL
  Filled 2017-02-23: qty 1

## 2017-02-23 NOTE — Procedures (Signed)
RIGHT percutaneous nephrostomy placement under [place] and  fluoro No complication No blood loss. See complete dictation in [facility].

## 2017-02-23 NOTE — OR Nursing (Signed)
Dr Jarvis Newcomer in to see pt, pt requesting aditional dose of zofran pre narcotic. Order received for OTC zofran.

## 2017-02-25 NOTE — Progress Notes (Signed)
[place]  Telephone:(336) [phone] Fax:(336) [phone]  ID: Patricia Rojas OB: 07-20-1966  MR#: [id number]  HWK#:[id number]  Patient Care Team: No Pcp Per Patient as PCP - General (General Practice) Clent Jacks, RN as Registered Nurse  CHIEF COMPLAINT: Squamous cell carcinoma of the cervix.  INTERVAL HISTORY: Patient returns to clinic today for further evaluation and consideration of cycle 2 of weekly cisplatin. She continues with daily XRT and is tolerating it well. Her pain and nausea have significantly improved, but still evident. She has no neurologic complaints. She denies any recent fevers or illnesses. She has a fair appetite. She has no chest pain or shortness of breath. She denies any vomiting, or diarrhea. She has no urinary complaints. Patient offers no further specific complaints today.  REVIEW OF SYSTEMS:   Review of Systems  Constitutional: Negative.  Negative for fever, malaise/fatigue and weight loss.  Respiratory: Negative.  Negative for cough and shortness of breath.   Cardiovascular: Negative.  Negative for chest pain and leg swelling.  Gastrointestinal: Positive for constipation and nausea. Negative for abdominal pain.  Genitourinary:       Pelvic pain.  Musculoskeletal: Negative.   Neurological: Negative.  Negative for weakness.  Psychiatric/Behavioral: The patient is nervous/anxious.     As per HPI. Otherwise, a complete review of systems is negative.  PAST MEDICAL HISTORY: Past Medical History:  Diagnosis Date  . Cancer (HCC)    cervical  . Constipation   . Hemorrhoids   . N&V (nausea and vomiting)   . PONV (postoperative nausea and vomiting)     PAST SURGICAL HISTORY: Past Surgical History:  Procedure Laterality Date  . CESAREAN SECTION  1986  . CHOLECYSTECTOMY    . HERNIA REPAIR    . IR GENERIC HISTORICAL  02/23/2017   IR NEPHROSTOMY PLACEMENT RIGHT 02/23/2017 [name], MD [facility]-INTERV RAD    FAMILY HISTORY: Family  History  Problem Relation Age of Onset  . Cancer Maternal Uncle   . Cancer Paternal Aunt   . Kidney cancer Neg Hx   . Bladder Cancer Neg Hx     ADVANCED DIRECTIVES (Y/N):  N  HEALTH MAINTENANCE: Social History  Substance Use Topics  . Smoking status: Former Smoker    Types: Cigarettes    Quit date: 61  . Smokeless tobacco: Never Used  . Alcohol use No     Colonoscopy:  PAP:  Bone density:  Lipid panel:  No Known Allergies  Current Outpatient Prescriptions  Medication Sig Dispense Refill  . ALPRAZolam (XANAX) 0.25 MG tablet Take 1-2 tabs daily as needed 60 tablet 0  . ondansetron (ZOFRAN) 8 MG tablet Take 1 tablet (8 mg total) by mouth 2 (two) times daily as needed. 60 tablet 2  . Oxycodone HCl 10 MG TABS Take 1 tablet (10 mg total) by mouth every 6 (six) hours as needed. 20 tablet 0  . prochlorperazine (COMPAZINE) 10 MG tablet Take 1 tablet (10 mg total) by mouth every 6 (six) hours as needed (Nausea or vomiting). 60 tablet 2  . dexamethasone (DECADRON) 4 MG tablet Take 1 tablet (4 mg total) by mouth 2 (two) times daily with a meal. (Patient not taking: Reported on 02/23/2017) 30 tablet 1   No current facility-administered medications for this visit.     OBJECTIVE: Vitals:   02/27/17 1027  BP: (!) 148/97  Pulse: (!) 101  Resp: 18  Temp: 98.4 F (36.9 C)     Body mass index is 45.27 kg/m.  ECOG FS:1 - Symptomatic but completely ambulatory  General: Well-developed, well-nourished, no acute distress. Eyes: Pink conjunctiva, anicteric sclera. Lungs: Clear to auscultation bilaterally. Heart: Regular rate and rhythm. No rubs, murmurs, or gallops. Abdomen: Soft, nontender, nondistended. No organomegaly noted, normoactive bowel sounds. Musculoskeletal: No edema, cyanosis, or clubbing. Neuro: Alert, answering all questions appropriately. Cranial nerves grossly intact. Skin: No rashes or petechiae noted. Psych: Normal affect.   LAB RESULTS:  Lab Results    Component Value Date   NA 131 (L) 02/27/2017   K 4.4 02/27/2017   CL 98 (L) 02/27/2017   CO2 28 02/27/2017   GLUCOSE 102 (H) 02/27/2017   BUN 20 02/27/2017   CREATININE 1.27 (H) 02/27/2017   CALCIUM 9.3 02/27/2017   PROT 8.7 (H) 02/07/2017   ALBUMIN 3.8 02/07/2017   AST 19 02/07/2017   ALT 12 (L) 02/07/2017   ALKPHOS 79 02/07/2017   BILITOT 0.5 02/07/2017   GFRNONAA 48 (L) 02/27/2017   GFRAA 56 (L) 02/27/2017    Lab Results  Component Value Date   WBC 9.2 02/27/2017   NEUTROABS 5.7 02/27/2017   HGB 9.6 (L) 02/27/2017   HCT 28.7 (L) 02/27/2017   MCV 74.5 (L) 02/27/2017   PLT 390 02/27/2017     STUDIES: Ct Abdomen Pelvis W Contrast  Result Date: 02/05/2017 CLINICAL DATA:  Lower abdominal/ pelvic pain for 2 weeks. Nausea, fever. EXAM: CT ABDOMEN AND PELVIS WITH CONTRAST TECHNIQUE: Multidetector CT imaging of the abdomen and pelvis was performed using the standard protocol following bolus administration of intravenous contrast. CONTRAST:  169mL ISOVUE-300 IOPAMIDOL (ISOVUE-300) INJECTION 61% COMPARISON:  None. FINDINGS: Lower chest: The lung bases are clear. No pleural fluid or consolidation. Small paraesophageal nodes noted adjacent to the distal esophagus. Hepatobiliary: The liver is prominent size. Focal fatty infiltration adjacent to the falciform ligament. Subcapsular low-attenuation in the right lobe in the region of gallbladder fossa likely also focal fatty infiltration. Clips in the gallbladder fossa postcholecystectomy. No biliary dilatation. Pancreas: No ductal dilatation or inflammation. Spleen: Upper normal in size without focal abnormality. Adrenals/Urinary Tract: Moderate right hydronephrosis and decreased renal perfusion. The ureter is dilated throughout with transition in the pelvis secondary to apparent cervical mass. Absent right renal excretion on delayed phase imaging. No left hydronephrosis. No urolithiasis. Urinary bladder is physiologically distended. There is  mass effect from apparent uterine/cervical mass in the region of the right urinary trigone. Stomach/Bowel: Multifocal colonic diverticulosis is most prominent in the descending and sigmoid colon, no acute inflammation. Stomach is physiologically distended. No small bowel dilatation. Normal appendix. Vascular/Lymphatic: Small left external iliac node measures 8 mm short axis. There are small retroperitoneal nodes. No mesenteric or inguinal adenopathy. Small nodes adjacent to the distal esophagus in the lower thorax. Minimal atherosclerosis of the abdominal aorta without aneurysm. Reproductive: Ill-defined lower uterine/cervical mass with irregular soft tissue thickening, appears near circumferential. There is some associated low-density. Air within the cervical canal may be secondary to necrosis. Mild adjacent soft tissue stranding. Ovaries are symmetric in size. No definite pelvic free fluid. Other: Fat containing umbilical hernia. Mesh in the upper anterior abdominal wall. No free air or free fluid. Musculoskeletal: There are no acute or suspicious osseous abnormalities. No blastic or evidence of lytic lesions. IMPRESSION: 1. Near circumferential irregular masslike density involving the lower uterus or cervix highly concerning for primary neoplasm. Air in the cervical canal can be seen in the setting of necrosis. 2. Uterine/cervical mass causes obstruction of the distal right ureter in the pelvis with  moderate hydroureteronephrosis. 3. Left external 8 mm iliac node is not enlarged by size criteria, however is indeterminate. 4. Colonic diverticulosis without diverticulitis. 5. Small nodes adjacent to the distal esophagus are of uncertain etiology and significance. Electronically Signed   By: Jeb Levering M.D.   On: 02/05/2017 00:49   Nm Pet Image Initial (pi) Skull Base To Thigh  Result Date: 02/14/2017 CLINICAL DATA:  Initial treatment strategy for new diagnosis of malignant neoplasm of uterus. Cervical  biopsy last Wednesday. EXAM: NUCLEAR MEDICINE PET SKULL BASE TO THIGH TECHNIQUE: 12.5 mCi F-18 FDG was injected intravenously. Full-ring PET imaging was performed from the skull base to thigh after the radiotracer. CT data was obtained and used for attenuation correction and anatomic localization. FASTING BLOOD GLUCOSE:  Value: 97 mg/dl COMPARISON:  Abdominopelvic CT of 02/05/2017. Chest radiograph of 01/17/2016. FINDINGS: Mild degradation secondary to patient body habitus. This preferentially affects the evaluation the pelvis. NECK No areas of abnormal hypermetabolism.  No cervical adenopathy. CHEST No pulmonary parenchymal poor nodal hypermetabolism. Mild cardiomegaly. Tiny hiatal hernia. ABDOMEN/PELVIS Hypermetabolism corresponding to the presumably centrally necrotic lower uterine segment/ cervical mass. This measures a S.U.V. max of 25.1, including on image 240/series 3. Hypermetabolism is identified along the course of the iliac vasculature bilaterally. This is at least partially felt to be secondary to physiologic vascular activity. However, a left external iliac node measures 12 mm and a S.U.V. max of 4.0 on image 225/ series 3, suspicious. Cholecystectomy. Other abdominopelvic findings deferred to recent diagnostic CT. Right-sided hydroureteronephrosis is mild to moderate and similar. Pelvic floor laxity. Ventral abdominal wall hernia repair with separate area of more inferior fat containing hernia identified within the pelvis. SKELETON No abnormal marrow activity. No focal osseous lesion. IMPRESSION: 1. Marked hypermetabolism corresponding to centrally necrotic lower uterine segment/cervical mass. 2. Mild degradation secondary to patient body habitus, especially involving the pelvis. 3. Although there is extensive vascular physiologic hypermetabolism involving pelvic sidewalls, at least 1 enlarged node (in the left external iliac station) is suspicious for pelvic nodal metastasis. 4. No evidence of  hypermetabolic extrapelvic disease. Electronically Signed   By: Abigail Miyamoto M.D.   On: 02/14/2017 15:46   Ir Nephrostomy Placement Right  Result Date: 02/23/2017 CLINICAL DATA:  Endometrial mass with distal right ureteral obstruction and hydronephrosis. Renal insufficiency. EXAM: RIGHT PERCUTANEOUS NEPHROSTOMY CATHETER PLACEMENT UNDER ULTRASOUND AND FLUOROSCOPIC GUIDANCE FLUOROSCOPY TIME:  1.7 minute (68 mGy) TECHNIQUE: The procedure, risks (including but not limited to bleeding, infection, organ damage ), benefits, and alternatives were explained to the patient. Questions regarding the procedure were encouraged and answered. The patient understands and consents to the procedure. RightFlank region prepped with chlorhexidine, draped in usual sterile fashion, infiltrated locally with 1% lidocaine. As antibiotic prophylaxis, cefazolin 2 g was ordered pre-procedure and administered intravenously within one hour of incision. Intravenous Fentanyl and Versed were administered as conscious sedation during continuous monitoring of the patient's level of consciousness and physiological / cardiorespiratory status by the radiology RN, with a total moderate sedation time of 19 minutes. Under real-time ultrasound guidance, a 21-gauge trocar needle was advanced into a posterior lower pole calyx. Ultrasound image documentation was saved. Urine spontaneously returned through the needle. Needle was exchanged over a guidewire for transitional dilator. Contrast injection confirmed appropriate positioning. Catheter was exchanged over a guidewire for a 10 French pigtail catheter, formed centrally within the right renal collecting system. Contrast injection confirms appropriate positioning and patency. Catheter secured externally with 0 Prolene suture and placed to external drain  bag. COMPLICATIONS: COMPLICATIONS none IMPRESSION: 1. Technically successful RIGHT percutaneous nephrostomy catheter placement. Electronically Signed   By:  Lucrezia Europe M.D.   On: 02/23/2017 09:17    ASSESSMENT: Squamous cell carcinoma of the cervix.  PLAN:    1. Squamous cell carcinoma of the cervix: PET scan results as well as pathology results reviewed independently confirming squamous cell carcinoma of the cervix. Patient also appears to have a nodal metastasis at the left internal iliac station. She has no obvious metastatic disease. Plan is to do concurrent pelvic radiation along with weekly chemotherapy using cisplatin. This will then be followed by internal brachytherapy which will [facility] be completed at [facility]. Continue daily XRT. Proceed with cycle 2 of weekly cisplatin. Return to clinic in 1 week for consideration of cycle 3.   2. Pain: Improving. Continue fentanyl patch to 50 g every 3 days along with oxycodone as needed. Continue dexamethasone. 3. Nausea: Continue Compazine as needed, dexamethasone as above. 4. Constipation: Continue MiraLAX as needed. 5. Anxiety: Continue Xanax as needed.  Patient expressed understanding and was in agreement with this plan. She also understands that She can call clinic at any time with any questions, concerns, or complaints.   Cancer Staging Uterine cancer [facility]) Staging form: Corpus Uteri - Carcinoma and Carcinosarcoma, AJCC 8th Edition - Clinical stage from 02/18/2017: FIGO Stage IIIC1 (cT3b, cN1, cM0) - Signed by Lloyd Huger, MD on 02/18/2017   Lloyd Huger, MD   03/04/2017 9:02 AM

## 2017-02-26 ENCOUNTER — Ambulatory Visit
Admission: RE | Admit: 2017-02-26 | Discharge: 2017-02-26 | Disposition: A | Discharge status: Home / Self Care | Payer: Medicaid Other | Source: Ambulatory Visit | Attending: Radiation Oncology | Admitting: Radiation Oncology

## 2017-02-27 ENCOUNTER — Inpatient Hospital Stay: Payer: Self-pay

## 2017-02-27 ENCOUNTER — Inpatient Hospital Stay (HOSPITAL_BASED_OUTPATIENT_CLINIC_OR_DEPARTMENT_OTHER): Payer: Self-pay | Admitting: Oncology

## 2017-02-27 ENCOUNTER — Ambulatory Visit
Admission: RE | Admit: 2017-02-27 | Discharge: 2017-02-27 | Disposition: A | Discharge status: Home / Self Care | Payer: Medicaid Other | Source: Ambulatory Visit | Attending: Radiation Oncology | Admitting: Radiation Oncology

## 2017-02-27 ENCOUNTER — Encounter: Payer: Self-pay | Admitting: Oncology

## 2017-02-27 VITALS — BP 148/97 | HR 101 | Temp 98.4°F | Resp 18 | Ht 65.0 in | Wt 272.0 lb

## 2017-02-27 DIAGNOSIS — C539 Malignant neoplasm of cervix uteri, unspecified: Secondary | ICD-10-CM

## 2017-02-27 DIAGNOSIS — C538 Malignant neoplasm of overlapping sites of cervix uteri: Secondary | ICD-10-CM

## 2017-02-27 DIAGNOSIS — N1330 Unspecified hydronephrosis: Secondary | ICD-10-CM

## 2017-02-27 DIAGNOSIS — R110 Nausea: Secondary | ICD-10-CM

## 2017-02-27 DIAGNOSIS — R102 Pelvic and perineal pain: Secondary | ICD-10-CM

## 2017-02-27 DIAGNOSIS — C55 Malignant neoplasm of uterus, part unspecified: Secondary | ICD-10-CM

## 2017-02-27 DIAGNOSIS — Z79899 Other long term (current) drug therapy: Secondary | ICD-10-CM

## 2017-02-27 DIAGNOSIS — K5900 Constipation, unspecified: Secondary | ICD-10-CM

## 2017-02-27 DIAGNOSIS — K429 Umbilical hernia without obstruction or gangrene: Secondary | ICD-10-CM

## 2017-02-27 DIAGNOSIS — F419 Anxiety disorder, unspecified: Secondary | ICD-10-CM

## 2017-02-27 DIAGNOSIS — N135 Crossing vessel and stricture of ureter without hydronephrosis: Secondary | ICD-10-CM

## 2017-02-27 DIAGNOSIS — Z809 Family history of malignant neoplasm, unspecified: Secondary | ICD-10-CM

## 2017-02-27 DIAGNOSIS — K5730 Diverticulosis of large intestine without perforation or abscess without bleeding: Secondary | ICD-10-CM

## 2017-02-27 DIAGNOSIS — I700 Atherosclerosis of aorta: Secondary | ICD-10-CM

## 2017-02-27 LAB — CBC WITH DIFFERENTIAL/PLATELET
BASOS ABS: 0 10*3/uL (ref 0–0.1)
Basophils Relative: 0 %
Eosinophils Absolute: 1.7 10*3/uL — ABNORMAL HIGH (ref 0–0.7)
Eosinophils Relative: 18 %
HEMATOCRIT: 28.7 % — AB (ref 35.0–47.0)
HEMOGLOBIN: 9.6 g/dL — AB (ref 12.0–16.0)
LYMPHS PCT: 10 %
Lymphs Abs: 0.9 10*3/uL — ABNORMAL LOW (ref 1.0–3.6)
MCH: 24.9 pg — ABNORMAL LOW (ref 26.0–34.0)
MCHC: 33.4 g/dL (ref 32.0–36.0)
MCV: 74.5 fL — AB (ref 80.0–100.0)
Monocytes Absolute: 0.9 10*3/uL (ref 0.2–0.9)
Monocytes Relative: 9 %
NEUTROS ABS: 5.7 10*3/uL (ref 1.4–6.5)
NEUTROS PCT: 63 %
PLATELETS: 390 10*3/uL (ref 150–440)
RBC: 3.86 MIL/uL (ref 3.80–5.20)
RDW: 14.8 % — ABNORMAL HIGH (ref 11.5–14.5)
WBC: 9.2 10*3/uL (ref 3.6–11.0)

## 2017-02-27 LAB — BASIC METABOLIC PANEL
ANION GAP: 5 (ref 5–15)
BUN: 20 mg/dL (ref 6–20)
CHLORIDE: 98 mmol/L — AB (ref 101–111)
CO2: 28 mmol/L (ref 22–32)
CREATININE: 1.27 mg/dL — AB (ref 0.44–1.00)
Calcium: 9.3 mg/dL (ref 8.9–10.3)
GFR calc Af Amer: 56 mL/min — ABNORMAL LOW (ref >60)
GFR calc non Af Amer: 48 mL/min — ABNORMAL LOW (ref >60)
GLUCOSE: 102 mg/dL — AB (ref 65–99)
POTASSIUM: 4.4 mmol/L (ref 3.5–5.1)
Sodium: 131 mmol/L — ABNORMAL LOW (ref 135–145)

## 2017-02-27 MED ORDER — PALONOSETRON HCL INJECTION 0.25 MG/5ML
0.2500 mg | Freq: Once | INTRAVENOUS | Status: AC
  Administered 2017-02-27: 0.25 mg via INTRAVENOUS
  Filled 2017-02-27: qty 5

## 2017-02-27 MED ORDER — SODIUM CHLORIDE 0.9 % IV SOLN
Freq: Once | INTRAVENOUS | Status: AC
  Administered 2017-02-27: 11:00:00 via INTRAVENOUS
  Filled 2017-02-27: qty 1000

## 2017-02-27 MED ORDER — SODIUM CHLORIDE 0.9 % IV SOLN
40.0000 mg/m2 | Freq: Once | INTRAVENOUS | Status: AC
  Administered 2017-02-27: 97 mg via INTRAVENOUS
  Filled 2017-02-27: qty 97

## 2017-02-27 MED ORDER — DEXTROSE-NACL 5-0.45 % IV SOLN
Freq: Once | INTRAVENOUS | Status: AC
  Administered 2017-02-27: 12:00:00 via INTRAVENOUS
  Filled 2017-02-27: qty 1000

## 2017-02-27 MED ORDER — FOSAPREPITANT DIMEGLUMINE INJECTION 150 MG
Freq: Once | INTRAVENOUS | Status: AC
  Administered 2017-02-27: 14:00:00 via INTRAVENOUS
  Filled 2017-02-27: qty 5

## 2017-02-27 NOTE — Progress Notes (Signed)
Nausea is the big concern.

## 2017-02-27 NOTE — Progress Notes (Unsigned)
PSN assisting patient with help from the [place] and [employer].

## 2017-02-28 ENCOUNTER — Ambulatory Visit: Payer: Medicaid Other

## 2017-03-01 ENCOUNTER — Ambulatory Visit
Admission: RE | Admit: 2017-03-01 | Discharge: 2017-03-01 | Disposition: A | Discharge status: Home / Self Care | Payer: Medicaid Other | Source: Ambulatory Visit | Attending: Radiation Oncology | Admitting: Radiation Oncology

## 2017-03-02 ENCOUNTER — Ambulatory Visit
Admission: RE | Admit: 2017-03-02 | Discharge: 2017-03-02 | Disposition: A | Discharge status: Home / Self Care | Payer: Medicaid Other | Source: Ambulatory Visit | Attending: Radiation Oncology | Admitting: Radiation Oncology

## 2017-03-04 NOTE — Progress Notes (Signed)
[place]  [phone]) [phone] Fax:(336) [phone]  ID: Patricia Rojas OB: 08/17/1966  MR#: [id number]  TDD#:[id number]  Patient Care Team: No Pcp Per Patient as PCP - General (General Practice) Clent Jacks, RN as Registered Nurse  CHIEF COMPLAINT: Squamous cell carcinoma of the cervix.  INTERVAL HISTORY: Patient returns to clinic today for further evaluation and consideration of cycle 3 of weekly cisplatin. She continues with daily XRT and is tolerating it well. Her pain and nausea have significantly improved. She has no neurologic complaints. She denies any recent fevers or illnesses. She has a fair appetite. She has no chest pain or shortness of breath. She denies any vomiting, or diarrhea. She has no urinary complaints. Patient offers no further specific complaints today.  REVIEW OF SYSTEMS:   Review of Systems  Constitutional: Negative.  Negative for fever, malaise/fatigue and weight loss.  Respiratory: Negative.  Negative for cough and shortness of breath.   Cardiovascular: Negative.  Negative for chest pain and leg swelling.  Gastrointestinal: Negative for abdominal pain, constipation and nausea.  Musculoskeletal: Negative.   Neurological: Negative.  Negative for weakness.  Psychiatric/Behavioral: The patient is nervous/anxious.     As per HPI. Otherwise, a complete review of systems is negative.  PAST MEDICAL HISTORY: Past Medical History:  Diagnosis Date  . Cancer (HCC)    cervical  . Constipation   . Hemorrhoids   . N&V (nausea and vomiting)   . PONV (postoperative nausea and vomiting)     PAST SURGICAL HISTORY: Past Surgical History:  Procedure Laterality Date  . CESAREAN SECTION  1986  . CHOLECYSTECTOMY    . HERNIA REPAIR    . IR GENERIC HISTORICAL  02/23/2017   IR NEPHROSTOMY PLACEMENT RIGHT 02/23/2017 [name], MD [facility]-INTERV RAD    FAMILY HISTORY: Family History  Problem Relation Age of Onset  . Cancer Maternal Uncle   .  Cancer Paternal Aunt   . Kidney cancer Neg Hx   . Bladder Cancer Neg Hx     ADVANCED DIRECTIVES (Y/N):  N  HEALTH MAINTENANCE: Social History  Substance Use Topics  . Smoking status: Former Smoker    Types: Cigarettes    Quit date: 58  . Smokeless tobacco: Never Used  . Alcohol use No     Colonoscopy:  PAP:  Bone density:  Lipid panel:  No Known Allergies  Current Outpatient Prescriptions  Medication Sig Dispense Refill  . ALPRAZolam (XANAX) 0.25 MG tablet Take 1-2 tabs daily as needed 60 tablet 0  . dexamethasone (DECADRON) 4 MG tablet Take 1 tablet (4 mg total) by mouth 2 (two) times daily with a meal. 30 tablet 1  . ondansetron (ZOFRAN) 8 MG tablet Take 1 tablet (8 mg total) by mouth 2 (two) times daily as needed. 60 tablet 2  . Oxycodone HCl 10 MG TABS Take 1 tablet (10 mg total) by mouth at bedtime. 30 tablet 0  . prochlorperazine (COMPAZINE) 10 MG tablet Take 1 tablet (10 mg total) by mouth every 6 (six) hours as needed (Nausea or vomiting). 60 tablet 2   No current facility-administered medications for this visit.     OBJECTIVE: Vitals:   03/06/17 0950  BP: (!) 142/95  Pulse: 95  Resp: 18  Temp: 98 F (36.7 C)     Body mass index is 44.34 kg/m.    ECOG FS:1 - Symptomatic but completely ambulatory  General: Well-developed, well-nourished, no acute distress. Eyes: Pink conjunctiva, anicteric sclera. Lungs: Clear to auscultation bilaterally.  Heart: Regular rate and rhythm. No rubs, murmurs, or gallops. Abdomen: Soft, nontender, nondistended. No organomegaly noted, normoactive bowel sounds. Musculoskeletal: No edema, cyanosis, or clubbing. Neuro: Alert, answering all questions appropriately. Cranial nerves grossly intact. Skin: No rashes or petechiae noted. Psych: Normal affect.   LAB RESULTS:  Lab Results  Component Value Date   NA 132 (L) 03/06/2017   K 3.9 03/06/2017   CL 101 03/06/2017   CO2 24 03/06/2017   GLUCOSE 99 03/06/2017   BUN 22 (H)  03/06/2017   CREATININE 1.46 (H) 03/06/2017   CALCIUM 8.8 (L) 03/06/2017   PROT 8.7 (H) 02/07/2017   ALBUMIN 3.8 02/07/2017   AST 19 02/07/2017   ALT 12 (L) 02/07/2017   ALKPHOS 79 02/07/2017   BILITOT 0.5 02/07/2017   GFRNONAA 41 (L) 03/06/2017   GFRAA 47 (L) 03/06/2017    Lab Results  Component Value Date   WBC 5.7 03/06/2017   NEUTROABS 3.9 03/06/2017   HGB 9.2 (L) 03/06/2017   HCT 27.0 (L) 03/06/2017   MCV 73.7 (L) 03/06/2017   PLT 272 03/06/2017     STUDIES: Nm Pet Image Initial (pi) Skull Base To Thigh  Result Date: 02/14/2017 CLINICAL DATA:  Initial treatment strategy for new diagnosis of malignant neoplasm of uterus. Cervical biopsy last Wednesday. EXAM: NUCLEAR MEDICINE PET SKULL BASE TO THIGH TECHNIQUE: 12.5 mCi F-18 FDG was injected intravenously. Full-ring PET imaging was performed from the skull base to thigh after the radiotracer. CT data was obtained and used for attenuation correction and anatomic localization. FASTING BLOOD GLUCOSE:  Value: 97 mg/dl COMPARISON:  Abdominopelvic CT of 02/05/2017. Chest radiograph of 01/17/2016. FINDINGS: Mild degradation secondary to patient body habitus. This preferentially affects the evaluation the pelvis. NECK No areas of abnormal hypermetabolism.  No cervical adenopathy. CHEST No pulmonary parenchymal poor nodal hypermetabolism. Mild cardiomegaly. Tiny hiatal hernia. ABDOMEN/PELVIS Hypermetabolism corresponding to the presumably centrally necrotic lower uterine segment/ cervical mass. This measures a S.U.V. max of 25.1, including on image 240/series 3. Hypermetabolism is identified along the course of the iliac vasculature bilaterally. This is at least partially felt to be secondary to physiologic vascular activity. However, a left external iliac node measures 12 mm and a S.U.V. max of 4.0 on image 225/ series 3, suspicious. Cholecystectomy. Other abdominopelvic findings deferred to recent diagnostic CT. Right-sided hydroureteronephrosis  is mild to moderate and similar. Pelvic floor laxity. Ventral abdominal wall hernia repair with separate area of more inferior fat containing hernia identified within the pelvis. SKELETON No abnormal marrow activity. No focal osseous lesion. IMPRESSION: 1. Marked hypermetabolism corresponding to centrally necrotic lower uterine segment/cervical mass. 2. Mild degradation secondary to patient body habitus, especially involving the pelvis. 3. Although there is extensive vascular physiologic hypermetabolism involving pelvic sidewalls, at least 1 enlarged node (in the left external iliac station) is suspicious for pelvic nodal metastasis. 4. No evidence of hypermetabolic extrapelvic disease. Electronically Signed   By: Abigail Miyamoto M.D.   On: 02/14/2017 15:46   Ir Nephrostomy Placement Right  Result Date: 02/23/2017 CLINICAL DATA:  Endometrial mass with distal right ureteral obstruction and hydronephrosis. Renal insufficiency. EXAM: RIGHT PERCUTANEOUS NEPHROSTOMY CATHETER PLACEMENT UNDER ULTRASOUND AND FLUOROSCOPIC GUIDANCE FLUOROSCOPY TIME:  1.7 minute (68 mGy) TECHNIQUE: The procedure, risks (including but not limited to bleeding, infection, organ damage ), benefits, and alternatives were explained to the patient. Questions regarding the procedure were encouraged and answered. The patient understands and consents to the procedure. RightFlank region prepped with chlorhexidine, draped in usual sterile fashion,  infiltrated locally with 1% lidocaine. As antibiotic prophylaxis, cefazolin 2 g was ordered pre-procedure and administered intravenously within one hour of incision. Intravenous Fentanyl and Versed were administered as conscious sedation during continuous monitoring of the patient's level of consciousness and physiological / cardiorespiratory status by the radiology RN, with a total moderate sedation time of 19 minutes. Under real-time ultrasound guidance, a 21-gauge trocar needle was advanced into a posterior  lower pole calyx. Ultrasound image documentation was saved. Urine spontaneously returned through the needle. Needle was exchanged over a guidewire for transitional dilator. Contrast injection confirmed appropriate positioning. Catheter was exchanged over a guidewire for a 10 French pigtail catheter, formed centrally within the right renal collecting system. Contrast injection confirms appropriate positioning and patency. Catheter secured externally with 0 Prolene suture and placed to external drain bag. COMPLICATIONS: COMPLICATIONS none IMPRESSION: 1. Technically successful RIGHT percutaneous nephrostomy catheter placement. Electronically Signed   By: Lucrezia Europe M.D.   On: 02/23/2017 09:17    ASSESSMENT: Squamous cell carcinoma of the cervix.  PLAN:    1. Squamous cell carcinoma of the cervix: PET scan results as well as pathology results reviewed independently confirming squamous cell carcinoma of the cervix. Patient also appears to have a nodal metastasis at the left internal iliac station. She has no other obvious metastatic disease. Plan is to do concurrent pelvic radiation along with weekly chemotherapy using cisplatin. This will then be followed by internal brachytherapy which will likely be completed at [facility]. Continue daily XRT. Proceed with cycle 3 of weekly cisplatin. Return to clinic in 1 week for consideration of cycle 4.   2. Pain: Improving. Patient has discontinued her fentanyl patch. Continue oxycodone as needed. Continue dexamethasone. 3. Nausea: Continue Compazine as needed, dexamethasone as above. 4. Constipation: Continue MiraLAX as needed. 5. Anxiety: Continue Xanax as needed. 6. Renal insufficiency: Right nephrostomy tube in place.  Continue to monitor closely while patient receiving cisplatin.  Patient expressed understanding and was in agreement with this plan. She also understands that She can call clinic at any time with any questions, concerns, or complaints.    Cancer Staging Uterine cancer [facility]) Staging form: Corpus Uteri - Carcinoma and Carcinosarcoma, AJCC 8th Edition - Clinical stage from 02/18/2017: FIGO Stage IIIC1 (cT3b, cN1, cM0) - Signed by Lloyd Huger, MD on 02/18/2017   Lloyd Huger, MD   03/12/2017 12:06 AM

## 2017-03-05 ENCOUNTER — Ambulatory Visit: Payer: Medicaid Other

## 2017-03-06 ENCOUNTER — Inpatient Hospital Stay (HOSPITAL_BASED_OUTPATIENT_CLINIC_OR_DEPARTMENT_OTHER): Payer: Self-pay | Admitting: Oncology

## 2017-03-06 ENCOUNTER — Inpatient Hospital Stay: Payer: Self-pay

## 2017-03-06 ENCOUNTER — Ambulatory Visit
Admission: RE | Admit: 2017-03-06 | Discharge: 2017-03-06 | Disposition: A | Discharge status: Home / Self Care | Payer: Medicaid Other | Source: Ambulatory Visit | Attending: Radiation Oncology | Admitting: Radiation Oncology

## 2017-03-06 VITALS — BP 142/95 | HR 95 | Temp 98.0°F | Resp 18 | Wt 266.4 lb

## 2017-03-06 DIAGNOSIS — R102 Pelvic and perineal pain: Secondary | ICD-10-CM

## 2017-03-06 DIAGNOSIS — C55 Malignant neoplasm of uterus, part unspecified: Secondary | ICD-10-CM

## 2017-03-06 DIAGNOSIS — I700 Atherosclerosis of aorta: Secondary | ICD-10-CM

## 2017-03-06 DIAGNOSIS — F419 Anxiety disorder, unspecified: Secondary | ICD-10-CM

## 2017-03-06 DIAGNOSIS — K5730 Diverticulosis of large intestine without perforation or abscess without bleeding: Secondary | ICD-10-CM

## 2017-03-06 DIAGNOSIS — R110 Nausea: Secondary | ICD-10-CM

## 2017-03-06 DIAGNOSIS — K5900 Constipation, unspecified: Secondary | ICD-10-CM

## 2017-03-06 DIAGNOSIS — N135 Crossing vessel and stricture of ureter without hydronephrosis: Secondary | ICD-10-CM

## 2017-03-06 DIAGNOSIS — C538 Malignant neoplasm of overlapping sites of cervix uteri: Secondary | ICD-10-CM | POA: Insufficient documentation

## 2017-03-06 DIAGNOSIS — C539 Malignant neoplasm of cervix uteri, unspecified: Secondary | ICD-10-CM

## 2017-03-06 DIAGNOSIS — K429 Umbilical hernia without obstruction or gangrene: Secondary | ICD-10-CM

## 2017-03-06 DIAGNOSIS — Z79899 Other long term (current) drug therapy: Secondary | ICD-10-CM

## 2017-03-06 DIAGNOSIS — Z809 Family history of malignant neoplasm, unspecified: Secondary | ICD-10-CM

## 2017-03-06 DIAGNOSIS — N1330 Unspecified hydronephrosis: Secondary | ICD-10-CM

## 2017-03-06 LAB — BASIC METABOLIC PANEL
Anion gap: 7 (ref 5–15)
BUN: 22 mg/dL — AB (ref 6–20)
CALCIUM: 8.8 mg/dL — AB (ref 8.9–10.3)
CHLORIDE: 101 mmol/L (ref 101–111)
CO2: 24 mmol/L (ref 22–32)
CREATININE: 1.46 mg/dL — AB (ref 0.44–1.00)
GFR calc Af Amer: 47 mL/min — ABNORMAL LOW (ref >60)
GFR calc non Af Amer: 41 mL/min — ABNORMAL LOW (ref >60)
GLUCOSE: 99 mg/dL (ref 65–99)
Potassium: 3.9 mmol/L (ref 3.5–5.1)
Sodium: 132 mmol/L — ABNORMAL LOW (ref 135–145)

## 2017-03-06 LAB — CBC WITH DIFFERENTIAL/PLATELET
BASOS PCT: 1 %
Basophils Absolute: 0 10*3/uL (ref 0–0.1)
Eosinophils Absolute: 0.5 10*3/uL (ref 0–0.7)
Eosinophils Relative: 9 %
HEMATOCRIT: 27 % — AB (ref 35.0–47.0)
HEMOGLOBIN: 9.2 g/dL — AB (ref 12.0–16.0)
LYMPHS ABS: 0.6 10*3/uL — AB (ref 1.0–3.6)
Lymphocytes Relative: 10 %
MCH: 25 pg — AB (ref 26.0–34.0)
MCHC: 33.9 g/dL (ref 32.0–36.0)
MCV: 73.7 fL — ABNORMAL LOW (ref 80.0–100.0)
MONO ABS: 0.7 10*3/uL (ref 0.2–0.9)
Monocytes Relative: 12 %
NEUTROS ABS: 3.9 10*3/uL (ref 1.4–6.5)
NEUTROS PCT: 68 %
Platelets: 272 10*3/uL (ref 150–440)
RBC: 3.66 MIL/uL — ABNORMAL LOW (ref 3.80–5.20)
RDW: 14.7 % — AB (ref 11.5–14.5)
WBC: 5.7 10*3/uL (ref 3.6–11.0)

## 2017-03-06 MED ORDER — SODIUM CHLORIDE 0.9 % IV SOLN
Freq: Once | INTRAVENOUS | Status: AC
  Administered 2017-03-06: 11:00:00 via INTRAVENOUS
  Filled 2017-03-06: qty 1000

## 2017-03-06 MED ORDER — HEPARIN SOD (PORK) LOCK FLUSH 100 UNIT/ML IV SOLN: 500.0000 [IU] | Freq: Once | INTRAVENOUS | Status: DC | PRN

## 2017-03-06 MED ORDER — PALONOSETRON HCL INJECTION 0.25 MG/5ML
0.2500 mg | Freq: Once | INTRAVENOUS | Status: AC
  Administered 2017-03-06: 0.25 mg via INTRAVENOUS
  Filled 2017-03-06: qty 5

## 2017-03-06 MED ORDER — OXYCODONE HCL 10 MG PO TABS: 10.0000 mg | ORAL_TABLET | Freq: Every day | ORAL | 0 refills | Status: DC

## 2017-03-06 MED ORDER — DEXTROSE-NACL 5-0.45 % IV SOLN
Freq: Once | INTRAVENOUS | Status: AC
  Administered 2017-03-06: 11:00:00 via INTRAVENOUS
  Filled 2017-03-06: qty 1000

## 2017-03-06 MED ORDER — SODIUM CHLORIDE 0.9 % IV SOLN
40.0000 mg/m2 | Freq: Once | INTRAVENOUS | Status: AC
  Administered 2017-03-06: 97 mg via INTRAVENOUS
  Filled 2017-03-06: qty 97

## 2017-03-06 MED ORDER — FOSAPREPITANT DIMEGLUMINE INJECTION 150 MG
Freq: Once | INTRAVENOUS | Status: AC
  Administered 2017-03-06: 13:00:00 via INTRAVENOUS
  Filled 2017-03-06: qty 5

## 2017-03-06 MED ORDER — SODIUM CHLORIDE 0.9% FLUSH
10.0000 mL | INTRAVENOUS | Status: DC | PRN
  Filled 2017-03-06: qty 10

## 2017-03-06 NOTE — Progress Notes (Signed)
Offers no complaints. States is feeling well. 

## 2017-03-07 ENCOUNTER — Ambulatory Visit: Payer: Medicaid Other

## 2017-03-07 DIAGNOSIS — Z6841 Body Mass Index (BMI) 40.0 and over, adult: Secondary | ICD-10-CM

## 2017-03-08 ENCOUNTER — Ambulatory Visit
Admission: RE | Admit: 2017-03-08 | Discharge: 2017-03-08 | Disposition: A | Discharge status: Home / Self Care | Payer: Medicaid Other | Source: Ambulatory Visit | Attending: Radiation Oncology | Admitting: Radiation Oncology

## 2017-03-09 ENCOUNTER — Ambulatory Visit
Admission: RE | Admit: 2017-03-09 | Discharge: 2017-03-09 | Disposition: A | Discharge status: Home / Self Care | Payer: Medicaid Other | Source: Ambulatory Visit | Attending: Radiation Oncology | Admitting: Radiation Oncology

## 2017-03-11 DIAGNOSIS — C538 Malignant neoplasm of overlapping sites of cervix uteri: Secondary | ICD-10-CM | POA: Diagnosis not present

## 2017-03-11 DIAGNOSIS — Z9049 Acquired absence of other specified parts of digestive tract: Secondary | ICD-10-CM | POA: Diagnosis not present

## 2017-03-11 DIAGNOSIS — Z510 Encounter for antineoplastic radiation therapy: Secondary | ICD-10-CM | POA: Diagnosis present

## 2017-03-11 DIAGNOSIS — R112 Nausea with vomiting, unspecified: Secondary | ICD-10-CM | POA: Diagnosis not present

## 2017-03-11 DIAGNOSIS — R109 Unspecified abdominal pain: Secondary | ICD-10-CM | POA: Diagnosis not present

## 2017-03-11 DIAGNOSIS — Z7952 Long term (current) use of systemic steroids: Secondary | ICD-10-CM | POA: Diagnosis not present

## 2017-03-11 DIAGNOSIS — K649 Unspecified hemorrhoids: Secondary | ICD-10-CM | POA: Diagnosis not present

## 2017-03-11 DIAGNOSIS — Z79899 Other long term (current) drug therapy: Secondary | ICD-10-CM | POA: Diagnosis not present

## 2017-03-11 DIAGNOSIS — Z809 Family history of malignant neoplasm, unspecified: Secondary | ICD-10-CM | POA: Diagnosis not present

## 2017-03-11 DIAGNOSIS — K5900 Constipation, unspecified: Secondary | ICD-10-CM | POA: Diagnosis not present

## 2017-03-11 DIAGNOSIS — N131 Hydronephrosis with ureteral stricture, not elsewhere classified: Secondary | ICD-10-CM | POA: Diagnosis not present

## 2017-03-12 ENCOUNTER — Inpatient Hospital Stay: Payer: Medicaid Other

## 2017-03-12 ENCOUNTER — Inpatient Hospital Stay: Payer: Medicaid Other | Attending: Oncology

## 2017-03-12 ENCOUNTER — Other Ambulatory Visit: Payer: Self-pay | Admitting: *Deleted

## 2017-03-12 ENCOUNTER — Ambulatory Visit
Admission: RE | Admit: 2017-03-12 | Discharge: 2017-03-12 | Disposition: A | Discharge status: Home / Self Care | Payer: Medicaid Other | Source: Ambulatory Visit | Attending: Radiation Oncology | Admitting: Radiation Oncology

## 2017-03-12 VITALS — BP 111/73 | HR 91 | Resp 20

## 2017-03-12 DIAGNOSIS — Z510 Encounter for antineoplastic radiation therapy: Secondary | ICD-10-CM | POA: Diagnosis not present

## 2017-03-12 DIAGNOSIS — K5900 Constipation, unspecified: Secondary | ICD-10-CM | POA: Diagnosis not present

## 2017-03-12 DIAGNOSIS — Z809 Family history of malignant neoplasm, unspecified: Secondary | ICD-10-CM | POA: Diagnosis not present

## 2017-03-12 DIAGNOSIS — R112 Nausea with vomiting, unspecified: Secondary | ICD-10-CM | POA: Diagnosis not present

## 2017-03-12 DIAGNOSIS — Z79899 Other long term (current) drug therapy: Secondary | ICD-10-CM | POA: Diagnosis not present

## 2017-03-12 DIAGNOSIS — C538 Malignant neoplasm of overlapping sites of cervix uteri: Secondary | ICD-10-CM

## 2017-03-12 DIAGNOSIS — Z5111 Encounter for antineoplastic chemotherapy: Secondary | ICD-10-CM | POA: Diagnosis not present

## 2017-03-12 DIAGNOSIS — F419 Anxiety disorder, unspecified: Secondary | ICD-10-CM | POA: Diagnosis not present

## 2017-03-12 DIAGNOSIS — Z87891 Personal history of nicotine dependence: Secondary | ICD-10-CM | POA: Insufficient documentation

## 2017-03-12 DIAGNOSIS — N2889 Other specified disorders of kidney and ureter: Secondary | ICD-10-CM | POA: Diagnosis not present

## 2017-03-12 LAB — COMPREHENSIVE METABOLIC PANEL
ALBUMIN: 3.6 g/dL (ref 3.5–5.0)
ALK PHOS: 84 U/L (ref 38–126)
ALT: 22 U/L (ref 14–54)
ANION GAP: 10 (ref 5–15)
AST: 26 U/L (ref 15–41)
BUN: 24 mg/dL — ABNORMAL HIGH (ref 6–20)
CHLORIDE: 101 mmol/L (ref 101–111)
CO2: 21 mmol/L — AB (ref 22–32)
CREATININE: 1.6 mg/dL — AB (ref 0.44–1.00)
Calcium: 8.9 mg/dL (ref 8.9–10.3)
GFR calc Af Amer: 42 mL/min — ABNORMAL LOW (ref >60)
GFR calc non Af Amer: 37 mL/min — ABNORMAL LOW (ref >60)
GLUCOSE: 98 mg/dL (ref 65–99)
POTASSIUM: 4.3 mmol/L (ref 3.5–5.1)
SODIUM: 132 mmol/L — AB (ref 135–145)
Total Bilirubin: 0.5 mg/dL (ref 0.3–1.2)
Total Protein: 7.7 g/dL (ref 6.5–8.1)

## 2017-03-12 LAB — MAGNESIUM: Magnesium: 1.6 mg/dL — ABNORMAL LOW (ref 1.7–2.4)

## 2017-03-12 MED ORDER — SODIUM CHLORIDE 0.9 % IV SOLN
Freq: Once | INTRAVENOUS | Status: AC
  Administered 2017-03-12: 10:00:00 via INTRAVENOUS
  Filled 2017-03-12: qty 1000

## 2017-03-12 MED ORDER — MAGNESIUM SULFATE 2 GM/50ML IV SOLN
2.0000 g | Freq: Once | INTRAVENOUS | Status: AC
  Administered 2017-03-12: 2 g via INTRAVENOUS
  Filled 2017-03-12: qty 50

## 2017-03-12 MED ORDER — SODIUM CHLORIDE 0.9 % IV SOLN
Freq: Once | INTRAVENOUS | Status: AC
  Administered 2017-03-12: 11:00:00 via INTRAVENOUS
  Filled 2017-03-12: qty 4

## 2017-03-12 NOTE — Progress Notes (Signed)
[place]  Telephone:(336) [phone] [phone]) [phone]  ID: Patricia Rojas OB: Oct 04, 1966  MR#: [id number]  YHC#:[id number]  Patient Care Team: No Pcp Per Patient as PCP - General (General Practice) Clent Jacks, RN as Registered Nurse  CHIEF COMPLAINT: Squamous cell carcinoma of the cervix.  INTERVAL HISTORY: Patient returns to clinic today for further evaluation and consideration of cycle 4 of weekly cisplatin. She had approximately 24-48 hours of significant nausea and vomiting over the weekend that required IV fluids yesterday. She is now recovered and feels nearly back to her baseline. She continues to tolerate her daily XRT. Her pain has significantly improved. She has no neurologic complaints. She denies any recent fevers or illnesses. She has a fair appetite. She has no chest pain or shortness of breath. She denies any constipation or diarrhea. She has no urinary complaints. Patient offers no further specific complaints today.  REVIEW OF SYSTEMS:   Review of Systems  Constitutional: Negative.  Negative for fever, malaise/fatigue and weight loss.  Respiratory: Negative.  Negative for cough and shortness of breath.   Cardiovascular: Negative.  Negative for chest pain and leg swelling.  Gastrointestinal: Negative for abdominal pain, constipation, diarrhea, nausea and vomiting.  Genitourinary: Negative.   Musculoskeletal: Negative.   Neurological: Negative.  Negative for weakness.  Psychiatric/Behavioral: The patient is nervous/anxious.     As per HPI. Otherwise, a complete review of systems is negative.  PAST MEDICAL HISTORY: Past Medical History:  Diagnosis Date  . Cancer (HCC)    cervical  . Constipation   . Hemorrhoids   . N&V (nausea and vomiting)   . PONV (postoperative nausea and vomiting)     PAST SURGICAL HISTORY: Past Surgical History:  Procedure Laterality Date  . CESAREAN SECTION  1986  . CHOLECYSTECTOMY    . HERNIA REPAIR    . IR  GENERIC HISTORICAL  02/23/2017   IR NEPHROSTOMY PLACEMENT RIGHT 02/23/2017 [name], MD [facility]-INTERV RAD    FAMILY HISTORY: Family History  Problem Relation Age of Onset  . Cancer Maternal Uncle   . Cancer Paternal Aunt   . Kidney cancer Neg Hx   . Bladder Cancer Neg Hx     ADVANCED DIRECTIVES (Y/N):  N  HEALTH MAINTENANCE: Social History  Substance Use Topics  . Smoking status: Former Smoker    Types: Cigarettes    Quit date: 74  . Smokeless tobacco: Never Used  . Alcohol use No     Colonoscopy:  PAP:  Bone density:  Lipid panel:  No Known Allergies  Current Outpatient Prescriptions  Medication Sig Dispense Refill  . ALPRAZolam (XANAX) 0.25 MG tablet Take 1-2 tabs daily as needed 60 tablet 0  . dexamethasone (DECADRON) 4 MG tablet Take 1 tablet (4 mg total) by mouth 2 (two) times daily with a meal. 30 tablet 1  . ondansetron (ZOFRAN) 8 MG tablet Take 1 tablet (8 mg total) by mouth 2 (two) times daily as needed. 60 tablet 2  . Oxycodone HCl 10 MG TABS Take 1 tablet (10 mg total) by mouth at bedtime. 30 tablet 0  . prochlorperazine (COMPAZINE) 10 MG tablet Take 1 tablet (10 mg total) by mouth every 6 (six) hours as needed (Nausea or vomiting). 60 tablet 2   No current facility-administered medications for this visit.     OBJECTIVE: Vitals:   03/13/17 0947  BP: (!) 144/89  Pulse: 82  Resp: 20  Temp: 97 F (36.1 C)     Body mass index  is 44.17 kg/m.    ECOG FS:1 - Symptomatic but completely ambulatory  General: Well-developed, well-nourished, no acute distress. Eyes: Pink conjunctiva, anicteric sclera. Lungs: Clear to auscultation bilaterally. Heart: Regular rate and rhythm. No rubs, murmurs, or gallops. Abdomen: Soft, nontender, nondistended. No organomegaly noted, normoactive bowel sounds. Musculoskeletal: No edema, cyanosis, or clubbing. Neuro: Alert, answering all questions appropriately. Cranial nerves grossly intact. Skin: No rashes or petechiae  noted. Psych: Normal affect.   LAB RESULTS:  Lab Results  Component Value Date   NA 135 03/13/2017   K 4.3 03/13/2017   CL 107 03/13/2017   CO2 20 (L) 03/13/2017   GLUCOSE 106 (H) 03/13/2017   BUN 30 (H) 03/13/2017   CREATININE 1.47 (H) 03/13/2017   CALCIUM 9.1 03/13/2017   PROT 7.7 03/12/2017   ALBUMIN 3.6 03/12/2017   AST 26 03/12/2017   ALT 22 03/12/2017   ALKPHOS 84 03/12/2017   BILITOT 0.5 03/12/2017   GFRNONAA 41 (L) 03/13/2017   GFRAA 47 (L) 03/13/2017    Lab Results  Component Value Date   WBC 5.1 03/13/2017   NEUTROABS 4.0 03/13/2017   HGB 9.0 (L) 03/13/2017   HCT 26.2 (L) 03/13/2017   MCV 72.8 (L) 03/13/2017   PLT 238 03/13/2017     STUDIES: Ir Nephrostomy Placement Right  Result Date: 02/23/2017 CLINICAL DATA:  Endometrial mass with distal right ureteral obstruction and hydronephrosis. Renal insufficiency. EXAM: RIGHT PERCUTANEOUS NEPHROSTOMY CATHETER PLACEMENT UNDER ULTRASOUND AND FLUOROSCOPIC GUIDANCE FLUOROSCOPY TIME:  1.7 minute (68 mGy) TECHNIQUE: The procedure, risks (including but not limited to bleeding, infection, organ damage ), benefits, and alternatives were explained to the patient. Questions regarding the procedure were encouraged and answered. The patient understands and consents to the procedure. RightFlank region prepped with chlorhexidine, draped in usual sterile fashion, infiltrated locally with 1% lidocaine. As antibiotic prophylaxis, cefazolin 2 g was ordered pre-procedure and administered intravenously within one hour of incision. Intravenous Fentanyl and Versed were administered as conscious sedation during continuous monitoring of the patient's level of consciousness and physiological / cardiorespiratory status by the radiology RN, with a total moderate sedation time of 19 minutes. Under real-time ultrasound guidance, a 21-gauge trocar needle was advanced into a posterior lower pole calyx. Ultrasound image documentation was saved. Urine  spontaneously returned through the needle. Needle was exchanged over a guidewire for transitional dilator. Contrast injection confirmed appropriate positioning. Catheter was exchanged over a guidewire for a 10 French pigtail catheter, formed centrally within the right renal collecting system. Contrast injection confirms appropriate positioning and patency. Catheter secured externally with 0 Prolene suture and placed to external drain bag. COMPLICATIONS: COMPLICATIONS none IMPRESSION: 1. Technically successful RIGHT percutaneous nephrostomy catheter placement. Electronically Signed   By: Lucrezia Europe M.D.   On: 02/23/2017 09:17    ASSESSMENT: Squamous cell carcinoma of the cervix.  PLAN:    1. Squamous cell carcinoma of the cervix: PET scan results as well as pathology results reviewed independently confirming squamous cell carcinoma of the cervix. Patient also appears to have a nodal metastasis at the left internal iliac station. She has no other obvious metastatic disease. Plan is to do concurrent pelvic radiation along with weekly chemotherapy using cisplatin. This will then be followed by internal brachytherapy which will likely be completed at [facility]. Continue daily XRT which will be completed on March 30, 2017. Proceed with cycle 4 of weekly cisplatin. Return to clinic in 1 week for consideration of cycle 5 and then in 2 weeks for further evaluation  and consideration of cycle 6.   2. Pain: Improving. Patient has discontinued her fentanyl patch. Continue oxycodone as needed. Continue dexamethasone. 3. Nausea: Continue Compazine as needed, dexamethasone as above. 4. Constipation: Continue MiraLAX as needed. 5. Anxiety: Continue Xanax as needed. 6. Renal insufficiency: Right nephrostomy tube in place.  Continue to monitor closely while patient receiving cisplatin. 7. Nausea and vomiting: Resolved. Appears to be unrelated to patient's underlying treatment. Possibly viral  gastroenteritis.  Patient expressed understanding and was in agreement with this plan. She also understands that She can call clinic at any time with any questions, concerns, or complaints.   Cancer Staging Uterine cancer [facility]) Staging form: Corpus Uteri - Carcinoma and Carcinosarcoma, AJCC 8th Edition - Clinical stage from 02/18/2017: FIGO Stage IIIC1 (cT3b, cN1, cM0) - Signed by Lloyd Huger, MD on 02/18/2017   Lloyd Huger, MD   03/17/2017 11:20 AM

## 2017-03-13 ENCOUNTER — Inpatient Hospital Stay: Payer: Medicaid Other

## 2017-03-13 ENCOUNTER — Ambulatory Visit
Admission: RE | Admit: 2017-03-13 | Discharge: 2017-03-13 | Disposition: A | Discharge status: Home / Self Care | Payer: Medicaid Other | Source: Ambulatory Visit | Attending: Radiation Oncology | Admitting: Radiation Oncology

## 2017-03-13 ENCOUNTER — Inpatient Hospital Stay (HOSPITAL_BASED_OUTPATIENT_CLINIC_OR_DEPARTMENT_OTHER): Payer: Medicaid Other | Admitting: Oncology

## 2017-03-13 VITALS — BP 144/89 | HR 82 | Temp 97.0°F | Resp 20 | Wt 265.4 lb

## 2017-03-13 DIAGNOSIS — Z809 Family history of malignant neoplasm, unspecified: Secondary | ICD-10-CM

## 2017-03-13 DIAGNOSIS — C55 Malignant neoplasm of uterus, part unspecified: Secondary | ICD-10-CM

## 2017-03-13 DIAGNOSIS — R112 Nausea with vomiting, unspecified: Secondary | ICD-10-CM

## 2017-03-13 DIAGNOSIS — C538 Malignant neoplasm of overlapping sites of cervix uteri: Secondary | ICD-10-CM

## 2017-03-13 DIAGNOSIS — K5900 Constipation, unspecified: Secondary | ICD-10-CM

## 2017-03-13 DIAGNOSIS — F419 Anxiety disorder, unspecified: Secondary | ICD-10-CM

## 2017-03-13 DIAGNOSIS — Z79899 Other long term (current) drug therapy: Secondary | ICD-10-CM

## 2017-03-13 DIAGNOSIS — N2889 Other specified disorders of kidney and ureter: Secondary | ICD-10-CM

## 2017-03-13 DIAGNOSIS — Z87891 Personal history of nicotine dependence: Secondary | ICD-10-CM

## 2017-03-13 DIAGNOSIS — Z510 Encounter for antineoplastic radiation therapy: Secondary | ICD-10-CM | POA: Diagnosis not present

## 2017-03-13 DIAGNOSIS — Z5111 Encounter for antineoplastic chemotherapy: Secondary | ICD-10-CM | POA: Diagnosis not present

## 2017-03-13 LAB — BASIC METABOLIC PANEL
ANION GAP: 8 (ref 5–15)
BUN: 30 mg/dL — ABNORMAL HIGH (ref 6–20)
CALCIUM: 9.1 mg/dL (ref 8.9–10.3)
CHLORIDE: 107 mmol/L (ref 101–111)
CO2: 20 mmol/L — ABNORMAL LOW (ref 22–32)
Creatinine, Ser: 1.47 mg/dL — ABNORMAL HIGH (ref 0.44–1.00)
GFR calc Af Amer: 47 mL/min — ABNORMAL LOW (ref >60)
GFR calc non Af Amer: 41 mL/min — ABNORMAL LOW (ref >60)
Glucose, Bld: 106 mg/dL — ABNORMAL HIGH (ref 65–99)
Potassium: 4.3 mmol/L (ref 3.5–5.1)
Sodium: 135 mmol/L (ref 135–145)

## 2017-03-13 LAB — CBC WITH DIFFERENTIAL/PLATELET
BASOS ABS: 0 10*3/uL (ref 0–0.1)
Basophils Relative: 0 %
Eosinophils Absolute: 0 10*3/uL (ref 0–0.7)
Eosinophils Relative: 0 %
HEMATOCRIT: 26.2 % — AB (ref 35.0–47.0)
Hemoglobin: 9 g/dL — ABNORMAL LOW (ref 12.0–16.0)
LYMPHS ABS: 0.4 10*3/uL — AB (ref 1.0–3.6)
Lymphocytes Relative: 9 %
MCH: 25.2 pg — ABNORMAL LOW (ref 26.0–34.0)
MCHC: 34.6 g/dL (ref 32.0–36.0)
MCV: 72.8 fL — AB (ref 80.0–100.0)
MONO ABS: 0.6 10*3/uL (ref 0.2–0.9)
MONOS PCT: 12 %
Neutro Abs: 4 10*3/uL (ref 1.4–6.5)
Neutrophils Relative %: 79 %
Platelets: 238 10*3/uL (ref 150–440)
RBC: 3.59 MIL/uL — ABNORMAL LOW (ref 3.80–5.20)
RDW: 15 % — AB (ref 11.5–14.5)
WBC: 5.1 10*3/uL (ref 3.6–11.0)

## 2017-03-13 MED ORDER — POTASSIUM CHLORIDE 2 MEQ/ML IV SOLN
Freq: Once | INTRAVENOUS | Status: AC
  Administered 2017-03-13: 11:00:00 via INTRAVENOUS
  Filled 2017-03-13: qty 1000

## 2017-03-13 MED ORDER — SODIUM CHLORIDE 0.9 % IV SOLN
Freq: Once | INTRAVENOUS | Status: AC
  Administered 2017-03-13: 13:00:00 via INTRAVENOUS
  Filled 2017-03-13: qty 5

## 2017-03-13 MED ORDER — SODIUM CHLORIDE 0.9 % IV SOLN
Freq: Once | INTRAVENOUS | Status: AC
  Administered 2017-03-13: 11:00:00 via INTRAVENOUS
  Filled 2017-03-13: qty 1000

## 2017-03-13 MED ORDER — PALONOSETRON HCL INJECTION 0.25 MG/5ML
0.2500 mg | Freq: Once | INTRAVENOUS | Status: AC
  Administered 2017-03-13: 0.25 mg via INTRAVENOUS
  Filled 2017-03-13: qty 5

## 2017-03-13 MED ORDER — CISPLATIN CHEMO INJECTION 100MG/100ML
40.0000 mg/m2 | Freq: Once | INTRAVENOUS | Status: AC
  Administered 2017-03-13: 97 mg via INTRAVENOUS
  Filled 2017-03-13: qty 97

## 2017-03-13 NOTE — Progress Notes (Signed)
States is feeling better today after receiving IVF yesterday.

## 2017-03-14 ENCOUNTER — Ambulatory Visit
Admission: RE | Admit: 2017-03-14 | Discharge: 2017-03-14 | Disposition: A | Discharge status: Home / Self Care | Payer: Medicaid Other | Source: Ambulatory Visit | Attending: Radiation Oncology | Admitting: Radiation Oncology

## 2017-03-14 DIAGNOSIS — Z510 Encounter for antineoplastic radiation therapy: Secondary | ICD-10-CM | POA: Diagnosis not present

## 2017-03-15 ENCOUNTER — Ambulatory Visit
Admission: RE | Admit: 2017-03-15 | Discharge: 2017-03-15 | Disposition: A | Discharge status: Home / Self Care | Payer: Medicaid Other | Source: Ambulatory Visit | Attending: Radiation Oncology | Admitting: Radiation Oncology

## 2017-03-15 DIAGNOSIS — Z510 Encounter for antineoplastic radiation therapy: Secondary | ICD-10-CM | POA: Diagnosis not present

## 2017-03-16 ENCOUNTER — Ambulatory Visit
Admission: RE | Admit: 2017-03-16 | Discharge: 2017-03-16 | Disposition: A | Discharge status: Home / Self Care | Payer: Medicaid Other | Source: Ambulatory Visit | Attending: Radiation Oncology | Admitting: Radiation Oncology

## 2017-03-16 DIAGNOSIS — Z510 Encounter for antineoplastic radiation therapy: Secondary | ICD-10-CM | POA: Diagnosis not present

## 2017-03-19 ENCOUNTER — Ambulatory Visit
Admission: RE | Admit: 2017-03-19 | Discharge: 2017-03-19 | Disposition: A | Discharge status: Home / Self Care | Payer: Medicaid Other | Source: Ambulatory Visit | Attending: Radiation Oncology | Admitting: Radiation Oncology

## 2017-03-19 DIAGNOSIS — Z510 Encounter for antineoplastic radiation therapy: Secondary | ICD-10-CM | POA: Diagnosis not present

## 2017-03-20 ENCOUNTER — Inpatient Hospital Stay: Payer: Medicaid Other

## 2017-03-20 ENCOUNTER — Telehealth: Payer: Self-pay | Admitting: Pharmacist

## 2017-03-20 ENCOUNTER — Ambulatory Visit
Admission: RE | Admit: 2017-03-20 | Discharge: 2017-03-20 | Disposition: A | Discharge status: Home / Self Care | Payer: Medicaid Other | Source: Ambulatory Visit | Attending: Radiation Oncology | Admitting: Radiation Oncology

## 2017-03-20 VITALS — BP 133/84 | HR 93 | Temp 98.0°F | Resp 20

## 2017-03-20 DIAGNOSIS — Z5111 Encounter for antineoplastic chemotherapy: Secondary | ICD-10-CM | POA: Diagnosis not present

## 2017-03-20 DIAGNOSIS — C55 Malignant neoplasm of uterus, part unspecified: Secondary | ICD-10-CM

## 2017-03-20 DIAGNOSIS — Z510 Encounter for antineoplastic radiation therapy: Secondary | ICD-10-CM | POA: Diagnosis not present

## 2017-03-20 DIAGNOSIS — C538 Malignant neoplasm of overlapping sites of cervix uteri: Secondary | ICD-10-CM

## 2017-03-20 LAB — BASIC METABOLIC PANEL
ANION GAP: 6 (ref 5–15)
BUN: 22 mg/dL — AB (ref 6–20)
CHLORIDE: 103 mmol/L (ref 101–111)
CO2: 27 mmol/L (ref 22–32)
CREATININE: 1.55 mg/dL — AB (ref 0.44–1.00)
Calcium: 8.8 mg/dL — ABNORMAL LOW (ref 8.9–10.3)
GFR calc non Af Amer: 38 mL/min — ABNORMAL LOW (ref >60)
GFR, EST AFRICAN AMERICAN: 44 mL/min — AB (ref >60)
GLUCOSE: 94 mg/dL (ref 65–99)
Potassium: 3.7 mmol/L (ref 3.5–5.1)
SODIUM: 136 mmol/L (ref 135–145)

## 2017-03-20 LAB — CBC WITH DIFFERENTIAL/PLATELET
BASOS ABS: 0 10*3/uL (ref 0–0.1)
BASOS PCT: 1 %
EOS PCT: 1 %
Eosinophils Absolute: 0 10*3/uL (ref 0–0.7)
HCT: 26 % — ABNORMAL LOW (ref 35.0–47.0)
Hemoglobin: 8.8 g/dL — ABNORMAL LOW (ref 12.0–16.0)
LYMPHS PCT: 12 %
Lymphs Abs: 0.4 10*3/uL — ABNORMAL LOW (ref 1.0–3.6)
MCH: 25.3 pg — ABNORMAL LOW (ref 26.0–34.0)
MCHC: 34 g/dL (ref 32.0–36.0)
MCV: 74.3 fL — AB (ref 80.0–100.0)
MONO ABS: 0.5 10*3/uL (ref 0.2–0.9)
Monocytes Relative: 16 %
Neutro Abs: 2.3 10*3/uL (ref 1.4–6.5)
Neutrophils Relative %: 70 %
PLATELETS: 224 10*3/uL (ref 150–440)
RBC: 3.5 MIL/uL — AB (ref 3.80–5.20)
RDW: 15.4 % — AB (ref 11.5–14.5)
WBC: 3.3 10*3/uL — AB (ref 3.6–11.0)

## 2017-03-20 LAB — MAGNESIUM: Magnesium: 1.5 mg/dL — ABNORMAL LOW (ref 1.7–2.4)

## 2017-03-20 MED ORDER — SODIUM CHLORIDE 0.9 % IV SOLN
Freq: Once | INTRAVENOUS | Status: AC
  Administered 2017-03-20: 10:00:00 via INTRAVENOUS
  Filled 2017-03-20: qty 1000

## 2017-03-20 MED ORDER — FOSAPREPITANT DIMEGLUMINE INJECTION 150 MG
Freq: Once | INTRAVENOUS | Status: AC
  Administered 2017-03-20: 13:00:00 via INTRAVENOUS
  Filled 2017-03-20: qty 5

## 2017-03-20 MED ORDER — PALONOSETRON HCL INJECTION 0.25 MG/5ML
0.2500 mg | Freq: Once | INTRAVENOUS | Status: AC
  Administered 2017-03-20: 0.25 mg via INTRAVENOUS
  Filled 2017-03-20: qty 5

## 2017-03-20 MED ORDER — POTASSIUM CHLORIDE 2 MEQ/ML IV SOLN
Freq: Once | INTRAVENOUS | Status: AC
  Administered 2017-03-20: 11:00:00 via INTRAVENOUS
  Filled 2017-03-20: qty 1000

## 2017-03-20 MED ORDER — SODIUM CHLORIDE 0.9 % IV SOLN
30.0000 mg/m2 | Freq: Once | INTRAVENOUS | Status: AC
  Administered 2017-03-20: 73 mg via INTRAVENOUS
  Filled 2017-03-20: qty 73

## 2017-03-20 NOTE — Progress Notes (Signed)
10:15 - spoke with Dr. Mike Gip (on call MD) regarding patient's labs and she said go ahead with treatment today, she spoke with Myra (pharmacist) regarding reduction in Cisplatin dose. LJ

## 2017-03-20 NOTE — Telephone Encounter (Signed)
75% dose reduction (from 40mg /m2 to 30mg /m2) per MD. Due to decreased renal function

## 2017-03-21 ENCOUNTER — Ambulatory Visit
Admission: RE | Admit: 2017-03-21 | Discharge: 2017-03-21 | Disposition: A | Discharge status: Home / Self Care | Payer: Medicaid Other | Source: Ambulatory Visit | Attending: Radiation Oncology | Admitting: Radiation Oncology

## 2017-03-21 DIAGNOSIS — Z510 Encounter for antineoplastic radiation therapy: Secondary | ICD-10-CM | POA: Diagnosis not present

## 2017-03-22 ENCOUNTER — Telehealth: Payer: Self-pay | Admitting: *Deleted

## 2017-03-22 ENCOUNTER — Other Ambulatory Visit: Payer: Self-pay | Admitting: *Deleted

## 2017-03-22 ENCOUNTER — Ambulatory Visit
Admission: RE | Admit: 2017-03-22 | Discharge: 2017-03-22 | Disposition: A | Discharge status: Home / Self Care | Payer: Medicaid Other | Source: Ambulatory Visit | Attending: Radiation Oncology | Admitting: Radiation Oncology

## 2017-03-22 DIAGNOSIS — R197 Diarrhea, unspecified: Secondary | ICD-10-CM

## 2017-03-22 DIAGNOSIS — R112 Nausea with vomiting, unspecified: Secondary | ICD-10-CM

## 2017-03-22 DIAGNOSIS — Z510 Encounter for antineoplastic radiation therapy: Secondary | ICD-10-CM | POA: Diagnosis not present

## 2017-03-22 DIAGNOSIS — Z5189 Encounter for other specified aftercare: Secondary | ICD-10-CM

## 2017-03-22 MED ORDER — SODIUM CHLORIDE 0.9 % IV SOLN
Freq: Once | INTRAVENOUS | Status: DC
  Filled 2017-03-22: qty 1000

## 2017-03-22 NOTE — Telephone Encounter (Signed)
Had chemo 4/10, CDDP. Today she is vomiting times 2 and gagging, she also has developed watery diarrhea and gone 3 times today. She is using Imodium and her antiemetics (Zofran and Compazine), but feels they are not working to control her nausea. She is requesting to get IVF when she comes in tomorrow for Radiation. Please advise

## 2017-03-22 NOTE — Telephone Encounter (Signed)
Spoke with Jerene Pitch (scheduler) to schedule patient for 1 hour IVF tomorrow

## 2017-03-23 ENCOUNTER — Ambulatory Visit
Admission: RE | Admit: 2017-03-23 | Discharge: 2017-03-23 | Disposition: A | Discharge status: Home / Self Care | Payer: Medicaid Other | Source: Ambulatory Visit | Attending: Radiation Oncology | Admitting: Radiation Oncology

## 2017-03-23 ENCOUNTER — Inpatient Hospital Stay: Payer: Medicaid Other

## 2017-03-23 VITALS — BP 120/84 | HR 92 | Temp 96.9°F | Resp 20

## 2017-03-23 DIAGNOSIS — Z5111 Encounter for antineoplastic chemotherapy: Secondary | ICD-10-CM | POA: Diagnosis not present

## 2017-03-23 DIAGNOSIS — C530 Malignant neoplasm of endocervix: Secondary | ICD-10-CM

## 2017-03-23 DIAGNOSIS — Z510 Encounter for antineoplastic radiation therapy: Secondary | ICD-10-CM | POA: Diagnosis not present

## 2017-03-23 DIAGNOSIS — R110 Nausea: Secondary | ICD-10-CM

## 2017-03-23 DIAGNOSIS — C538 Malignant neoplasm of overlapping sites of cervix uteri: Secondary | ICD-10-CM

## 2017-03-23 MED ORDER — ONDANSETRON HCL 40 MG/20ML IJ SOLN
Freq: Once | INTRAMUSCULAR | Status: AC
Start: 2017-03-23 — End: 2017-03-23
  Administered 2017-03-23: 11:00:00 via INTRAVENOUS
  Filled 2017-03-23: qty 4

## 2017-03-23 MED ORDER — SODIUM CHLORIDE 0.9 % IV SOLN
Freq: Once | INTRAVENOUS | Status: AC
  Administered 2017-03-23: 10:00:00 via INTRAVENOUS
  Filled 2017-03-23: qty 1000

## 2017-03-23 MED ORDER — SODIUM CHLORIDE 0.9 % IV SOLN: Freq: Once | INTRAVENOUS | Status: DC

## 2017-03-26 ENCOUNTER — Ambulatory Visit
Admission: RE | Admit: 2017-03-26 | Discharge: 2017-03-26 | Disposition: A | Discharge status: Home / Self Care | Payer: Medicaid Other | Source: Ambulatory Visit | Attending: Radiation Oncology | Admitting: Radiation Oncology

## 2017-03-26 ENCOUNTER — Ambulatory Visit: Payer: Medicaid Other

## 2017-03-26 DIAGNOSIS — Z510 Encounter for antineoplastic radiation therapy: Secondary | ICD-10-CM | POA: Diagnosis not present

## 2017-03-26 NOTE — Progress Notes (Signed)
[name]  Telephone:(336) [phone] Fax:(336) [phone]  ID: Patricia Rojas OB: July 10, 1966  MR#: [id number]  WUX#:[id number]  Patient Care Team: No Pcp Per Patient as PCP - General (General Practice) Clent Jacks, RN as Registered Nurse  CHIEF COMPLAINT: Squamous cell carcinoma of the cervix.  INTERVAL HISTORY: Patient returns to clinic today for further evaluation and consideration of cycle 6 of weekly cisplatin. She continues to have intermittent nausea and vomiting. She continues to tolerate her daily XRT. Her pain has significantly improved. She has no neurologic complaints. She denies any recent fevers or illnesses. She has a fair appetite. She has no chest pain or shortness of breath. She denies any constipation or diarrhea. She has no urinary complaints. Patient offers no further specific complaints today.  REVIEW OF SYSTEMS:   Review of Systems  Constitutional: Negative.  Negative for fever, malaise/fatigue and weight loss.  Respiratory: Negative.  Negative for cough and shortness of breath.   Cardiovascular: Negative.  Negative for chest pain and leg swelling.  Gastrointestinal: Positive for nausea. Negative for abdominal pain, constipation and diarrhea.  Genitourinary: Negative.   Musculoskeletal: Negative.   Neurological: Negative.  Negative for weakness.  Psychiatric/Behavioral: The patient is nervous/anxious.     As per HPI. Otherwise, a complete review of systems is negative.  PAST MEDICAL HISTORY: Past Medical History:  Diagnosis Date  . Cancer (HCC)    cervical  . Constipation   . Hemorrhoids   . N&V (nausea and vomiting)   . PONV (postoperative nausea and vomiting)     PAST SURGICAL HISTORY: Past Surgical History:  Procedure Laterality Date  . CESAREAN SECTION  1986  . CHOLECYSTECTOMY    . HERNIA REPAIR    . IR GENERIC HISTORICAL  02/23/2017   IR NEPHROSTOMY PLACEMENT RIGHT 02/23/2017 [name], MD [facility]-INTER[facility] RAD    FAMILY  HISTORY: Family History  Problem Relation Age of Onset  . Cancer Maternal Uncle   . Cancer Paternal Aunt   . Kidney cancer Neg Hx   . Bladder Cancer Neg Hx     ADVANCED DIRECTIVES (Y/N):  N  HEALTH MAINTENANCE: Social History  Substance Use Topics  . Smoking status: Former Smoker    Types: Cigarettes    Quit date: 44  . Smokeless tobacco: Never Used  . Alcohol use No     Colonoscopy:  PAP:  Bone density:  Lipid panel:  No Known Allergies  No current facility-administered medications for this visit.    Current Outpatient Prescriptions  Medication Sig Dispense Refill  . ciprofloxacin (CIPRO) 250 MG tablet Take 1 tablet (250 mg total) by mouth 2 (two) times daily. 10 tablet 0  . ondansetron (ZOFRAN ODT) 4 MG disintegrating tablet Take 1 tablet (4 mg total) by mouth every 8 (eight) hours as needed for nausea or vomiting. 20 tablet 0  . promethazine (PHENERGAN) 25 MG suppository Place 1 suppository (25 mg total) rectally every 6 (six) hours as needed for nausea. 12 suppository 1   Facility-Administered Medications Ordered in Other Visits  Medication Dose Route Frequency Provider Last Rate Last Dose  . 0.9 %  sodium chloride infusion   Intravenous Continuous Hillary Bow, MD 100 mL/hr at 04/01/17 1853    . acetaminophen (TYLENOL) tablet 650 mg  650 mg Oral Q6H PRN Lance Coon, MD       Or  . acetaminophen (TYLENOL) suppository 650 mg  650 mg Rectal Q6H PRN Lance Coon, MD      . ALPRAZolam Duanne Moron)  tablet 0.25 mg  0.25 mg Oral Daily PRN Lance Coon, MD      . ciprofloxacin (CIPRO) tablet 250 mg  250 mg Oral BID Hillary Bow, MD   250 mg at 04/01/17 2006  . dexamethasone (DECADRON) tablet 4 mg  4 mg Oral BID WC Lance Coon, MD   4 mg at 04/01/17 1844  . dicyclomine (BENTYL) capsule 10 mg  10 mg Oral TID AC & HS Harrie Foreman, MD   10 mg at 04/01/17 2006  . enoxaparin (LOVENOX) injection 40 mg  40 mg Subcutaneous BID Lance Coon, MD   40 mg at 04/01/17 2005  .  feeding supplement (ENSURE ENLIVE) (ENSURE ENLIVE) liquid 237 mL  237 mL Oral BID BM Srikar Sudini, MD   237 mL at 04/01/17 1400  . HYDROcodone-acetaminophen (NORCO/VICODIN) 5-325 MG per tablet 1 tablet  1 tablet Oral Q4H PRN Hillary Bow, MD   1 tablet at 04/01/17 1844  . metoCLOPramide (REGLAN) injection 5 mg  5 mg Intravenous Q6H Lance Coon, MD   5 mg at 04/01/17 1845  . ondansetron (ZOFRAN) tablet 8 mg  8 mg Oral Q8H PRN Lance Coon, MD       Or  . ondansetron [facility]) 8 mg in sodium chloride 0.9 % 50 mL IVPB  8 mg Intravenous Q8H PRN Lance Coon, MD      . prochlorperazine (COMPAZINE) injection 10 mg  10 mg Intravenous Q6H PRN Lance Coon, MD   10 mg at 04/01/17 2006    OBJECTIVE: Vitals:   03/27/17 1103  BP: 112/82     There is no height or weight on file to calculate BMI.    ECOG FS:1 - Symptomatic but completely ambulatory  General: Well-developed, well-nourished, no acute distress. Eyes: Pink conjunctiva, anicteric sclera. Lungs: Clear to auscultation bilaterally. Heart: Regular rate and rhythm. No rubs, murmurs, or gallops. Abdomen: Soft, nontender, nondistended. No organomegaly noted, normoactive bowel sounds. Musculoskeletal: No edema, cyanosis, or clubbing. Neuro: Alert, answering all questions appropriately. Cranial nerves grossly intact. Skin: No rashes or petechiae noted. Psych: Normal affect.   LAB RESULTS:  Lab Results  Component Value Date   NA 136 04/01/2017   K 4.4 04/01/2017   CL 107 04/01/2017   CO2 23 04/01/2017   GLUCOSE 85 04/01/2017   BUN 29 (H) 04/01/2017   CREATININE 1.65 (H) 04/01/2017   CALCIUM 8.0 (L) 04/01/2017   PROT 7.0 03/31/2017   ALBUMIN 3.4 (L) 03/31/2017   AST 20 03/31/2017   ALT 14 03/31/2017   ALKPHOS 88 03/31/2017   BILITOT 0.4 03/31/2017   GFRNONAA 35 (L) 04/01/2017   GFRAA 41 (L) 04/01/2017    Lab Results  Component Value Date   WBC 2.6 (L) 04/01/2017   NEUTROABS 2.6 03/27/2017   HGB 7.1 (L) 04/01/2017   HCT 21.1  (L) 04/01/2017   MCV 76.0 (L) 04/01/2017   PLT 211 04/01/2017     STUDIES: No results found.  ASSESSMENT: Squamous cell carcinoma of the cervix.  PLAN:    1. Squamous cell carcinoma of the cervix: PET scan results as well as pathology results reviewed independently confirming squamous cell carcinoma of the cervix. Patient also appears to have a nodal metastasis at the left internal iliac station. She has no other obvious metastatic disease. Plan is to do concurrent pelvic radiation along with weekly chemotherapy using cisplatin. This will then be followed by internal brachytherapy which will likely be completed at [facility]. Continue daily XRT which will be completed  on March 30, 2017. Proceed with cycle 6 of weekly cisplatin. Patient has now completed her initial treatments and will go to [facility] for her brachytherapy. Return to clinic on May 15, 2017 for further evaluation and which time we will order a PET scan if one is not completed at [facility].   2. Pain: Improving. Patient has discontinued her fentanyl patch. Continue oxycodone as needed. Patient has been instructed to taper her dexamethasone use. 3. Nausea: Continue Compazine as needed, dexamethasone taper as above. 4. Constipation: Continue MiraLAX as needed. 5. Anxiety: Continue Xanax as needed. 6. Renal insufficiency: Right nephrostomy tube in place.  Replace as needed per nephrology. Continue to monitor closely while patient receiving cisplatin. 7. Nausea and vomiting: Improved. Monitor.  Patient expressed understanding and was in agreement with this plan. She also understands that She can call clinic at any time with any questions, concerns, or complaints.   Cancer Staging Uterine cancer [facility]) Staging form: Corpus Uteri - Carcinoma and Carcinosarcoma, AJCC 8th Edition - Clinical stage from 02/18/2017: FIGO Stage IIIC1 (cT3b, cN1, cM0) - Signed by Lloyd Huger, MD on 02/18/2017   Lloyd Huger, MD    04/01/2017 8:14 PM

## 2017-03-27 ENCOUNTER — Ambulatory Visit
Admission: RE | Admit: 2017-03-27 | Discharge: 2017-03-27 | Disposition: A | Discharge status: Home / Self Care | Payer: Medicaid Other | Source: Ambulatory Visit | Attending: Radiation Oncology | Admitting: Radiation Oncology

## 2017-03-27 ENCOUNTER — Inpatient Hospital Stay: Payer: Medicaid Other

## 2017-03-27 ENCOUNTER — Ambulatory Visit: Payer: Medicaid Other

## 2017-03-27 ENCOUNTER — Inpatient Hospital Stay (HOSPITAL_BASED_OUTPATIENT_CLINIC_OR_DEPARTMENT_OTHER): Payer: Medicaid Other | Admitting: Oncology

## 2017-03-27 VITALS — BP 112/82

## 2017-03-27 DIAGNOSIS — C538 Malignant neoplasm of overlapping sites of cervix uteri: Secondary | ICD-10-CM

## 2017-03-27 DIAGNOSIS — Z79899 Other long term (current) drug therapy: Secondary | ICD-10-CM

## 2017-03-27 DIAGNOSIS — C55 Malignant neoplasm of uterus, part unspecified: Secondary | ICD-10-CM

## 2017-03-27 DIAGNOSIS — N2889 Other specified disorders of kidney and ureter: Secondary | ICD-10-CM

## 2017-03-27 DIAGNOSIS — K5900 Constipation, unspecified: Secondary | ICD-10-CM

## 2017-03-27 DIAGNOSIS — F419 Anxiety disorder, unspecified: Secondary | ICD-10-CM

## 2017-03-27 DIAGNOSIS — R112 Nausea with vomiting, unspecified: Secondary | ICD-10-CM

## 2017-03-27 DIAGNOSIS — Z809 Family history of malignant neoplasm, unspecified: Secondary | ICD-10-CM

## 2017-03-27 DIAGNOSIS — Z87891 Personal history of nicotine dependence: Secondary | ICD-10-CM

## 2017-03-27 DIAGNOSIS — Z510 Encounter for antineoplastic radiation therapy: Secondary | ICD-10-CM | POA: Diagnosis not present

## 2017-03-27 DIAGNOSIS — Z5111 Encounter for antineoplastic chemotherapy: Secondary | ICD-10-CM | POA: Diagnosis not present

## 2017-03-27 LAB — CBC WITH DIFFERENTIAL/PLATELET
BASOS ABS: 0 10*3/uL (ref 0–0.1)
Basophils Relative: 1 %
Eosinophils Absolute: 0.1 10*3/uL (ref 0–0.7)
Eosinophils Relative: 3 %
HEMATOCRIT: 24.8 % — AB (ref 35.0–47.0)
Hemoglobin: 8.5 g/dL — ABNORMAL LOW (ref 12.0–16.0)
LYMPHS PCT: 10 %
Lymphs Abs: 0.3 10*3/uL — ABNORMAL LOW (ref 1.0–3.6)
MCH: 25.9 pg — ABNORMAL LOW (ref 26.0–34.0)
MCHC: 34.4 g/dL (ref 32.0–36.0)
MCV: 75.2 fL — AB (ref 80.0–100.0)
Monocytes Absolute: 0.4 10*3/uL (ref 0.2–0.9)
Monocytes Relative: 12 %
Neutro Abs: 2.6 10*3/uL (ref 1.4–6.5)
Neutrophils Relative %: 74 %
PLATELETS: 201 10*3/uL (ref 150–440)
RBC: 3.3 MIL/uL — AB (ref 3.80–5.20)
RDW: 16 % — ABNORMAL HIGH (ref 11.5–14.5)
WBC: 3.5 10*3/uL — ABNORMAL LOW (ref 3.6–11.0)

## 2017-03-27 LAB — BASIC METABOLIC PANEL
ANION GAP: 9 (ref 5–15)
BUN: 19 mg/dL (ref 6–20)
CO2: 23 mmol/L (ref 22–32)
Calcium: 8.4 mg/dL — ABNORMAL LOW (ref 8.9–10.3)
Chloride: 102 mmol/L (ref 101–111)
Creatinine, Ser: 1.45 mg/dL — ABNORMAL HIGH (ref 0.44–1.00)
GFR calc Af Amer: 48 mL/min — ABNORMAL LOW (ref >60)
GFR, EST NON AFRICAN AMERICAN: 41 mL/min — AB (ref >60)
GLUCOSE: 90 mg/dL (ref 65–99)
POTASSIUM: 3.7 mmol/L (ref 3.5–5.1)
Sodium: 134 mmol/L — ABNORMAL LOW (ref 135–145)

## 2017-03-27 LAB — MAGNESIUM: Magnesium: 1.3 mg/dL — ABNORMAL LOW (ref 1.7–2.4)

## 2017-03-27 MED ORDER — SODIUM CHLORIDE 0.9 % IV SOLN: 40.0000 mg/m2 | Freq: Once | INTRAVENOUS | Status: DC

## 2017-03-27 MED ORDER — SODIUM CHLORIDE 0.9 % IV SOLN
30.0000 mg/m2 | Freq: Once | INTRAVENOUS | Status: AC
  Administered 2017-03-27: 73 mg via INTRAVENOUS
  Filled 2017-03-27: qty 73

## 2017-03-27 MED ORDER — PALONOSETRON HCL INJECTION 0.25 MG/5ML
0.2500 mg | Freq: Once | INTRAVENOUS | Status: AC
  Administered 2017-03-27: 0.25 mg via INTRAVENOUS
  Filled 2017-03-27: qty 5

## 2017-03-27 MED ORDER — DEXTROSE-NACL 5-0.45 % IV SOLN
Freq: Once | INTRAVENOUS | Status: AC
  Administered 2017-03-27: 12:00:00 via INTRAVENOUS
  Filled 2017-03-27: qty 1000

## 2017-03-27 MED ORDER — SODIUM CHLORIDE 0.9 % IV SOLN
Freq: Once | INTRAVENOUS | Status: AC
  Administered 2017-03-27: 12:00:00 via INTRAVENOUS
  Filled 2017-03-27: qty 1000

## 2017-03-27 MED ORDER — SODIUM CHLORIDE 0.9 % IV SOLN
Freq: Once | INTRAVENOUS | Status: AC
  Administered 2017-03-27: 14:00:00 via INTRAVENOUS
  Filled 2017-03-27: qty 5

## 2017-03-27 MED ORDER — HEPARIN SOD (PORK) LOCK FLUSH 100 UNIT/ML IV SOLN: 500.0000 [IU] | Freq: Once | INTRAVENOUS | Status: DC | PRN

## 2017-03-27 MED ORDER — DIPHENOXYLATE-ATROPINE 2.5-0.025 MG PO TABS: 1.0000 | ORAL_TABLET | Freq: Four times a day (QID) | ORAL | 0 refills | Status: DC | PRN

## 2017-03-27 MED ORDER — MAGNESIUM SULFATE 2 GM/50ML IV SOLN: 2.0000 g | Freq: Once | INTRAVENOUS | Status: DC

## 2017-03-28 ENCOUNTER — Ambulatory Visit
Admission: RE | Admit: 2017-03-28 | Discharge: 2017-03-28 | Disposition: A | Discharge status: Home / Self Care | Payer: Medicaid Other | Source: Ambulatory Visit | Attending: Radiation Oncology | Admitting: Radiation Oncology

## 2017-03-28 ENCOUNTER — Ambulatory Visit: Payer: Medicaid Other

## 2017-03-28 DIAGNOSIS — Z510 Encounter for antineoplastic radiation therapy: Secondary | ICD-10-CM | POA: Diagnosis not present

## 2017-03-29 ENCOUNTER — Ambulatory Visit: Payer: Medicaid Other

## 2017-03-29 ENCOUNTER — Ambulatory Visit
Admission: RE | Admit: 2017-03-29 | Discharge: 2017-03-29 | Disposition: A | Discharge status: Home / Self Care | Payer: Medicaid Other | Source: Ambulatory Visit | Attending: Radiation Oncology | Admitting: Radiation Oncology

## 2017-03-29 DIAGNOSIS — Z510 Encounter for antineoplastic radiation therapy: Secondary | ICD-10-CM | POA: Diagnosis not present

## 2017-03-30 ENCOUNTER — Ambulatory Visit (INDEPENDENT_AMBULATORY_CARE_PROVIDER_SITE_OTHER): Payer: Self-pay | Admitting: Urology

## 2017-03-30 ENCOUNTER — Ambulatory Visit: Payer: Medicaid Other

## 2017-03-30 ENCOUNTER — Encounter: Payer: Self-pay | Admitting: Urology

## 2017-03-30 ENCOUNTER — Ambulatory Visit
Admission: RE | Admit: 2017-03-30 | Discharge: 2017-03-30 | Disposition: A | Discharge status: Home / Self Care | Payer: Medicaid Other | Source: Ambulatory Visit | Attending: Radiation Oncology | Admitting: Radiation Oncology

## 2017-03-30 VITALS — BP 123/84 | HR 97 | Ht 65.0 in | Wt 265.0 lb

## 2017-03-30 DIAGNOSIS — N1330 Unspecified hydronephrosis: Secondary | ICD-10-CM

## 2017-03-30 DIAGNOSIS — N179 Acute kidney failure, unspecified: Secondary | ICD-10-CM

## 2017-03-30 DIAGNOSIS — C539 Malignant neoplasm of cervix uteri, unspecified: Secondary | ICD-10-CM

## 2017-03-30 DIAGNOSIS — Z510 Encounter for antineoplastic radiation therapy: Secondary | ICD-10-CM | POA: Diagnosis not present

## 2017-03-30 NOTE — Progress Notes (Signed)
03/30/2017 2:03 PM   Patricia Rojas Sep 17, 1966 [id number]  Referring provider: No referring provider defined for this encounter.  Chief Complaint  Patient presents with  . Follow-up    hydronephrosis,cervical cancer     HPI: 51 year old female with right hydronephrosis secondary to stage IIIB cervical cancer managed with left PCN.   She is managed Dr. Fransisca Connors, [name], and Dr. Baruch Gouty receiving ciplatin, radiation, and scheduled for upcoming brachytherapy at [facility] in the near future.  She has been tolerating the tube well and had no issues. She is anxious to shower and was told she could not do so with the nephrostomy tube in place.   Her creatinine continues to rise slightly, most recently 1.45 on 03/27/2017 up from her baseline of what appears to be 0.68 in  2017.  No new cross-sectional imaging since PET scan on 02/15/2017.  Right nephrostomy tube placed on 02/23/2017.  PMH: Past Medical History:  Diagnosis Date  . Cancer (HCC)    cervical  . Constipation   . Hemorrhoids   . N&V (nausea and vomiting)   . PONV (postoperative nausea and vomiting)     Surgical History: Past Surgical History:  Procedure Laterality Date  . CESAREAN SECTION  1986  . CHOLECYSTECTOMY    . HERNIA REPAIR    . IR GENERIC HISTORICAL  02/23/2017   IR NEPHROSTOMY PLACEMENT RIGHT 02/23/2017 [name], MD [facility]-INTERV RAD    Home Medications:  Allergies as of 03/30/2017   No Known Allergies     Medication List       Accurate as of 03/30/17 11:59 PM. Always use your most recent med list.          ALPRAZolam 0.25 MG tablet Commonly known as:  XANAX Take 1-2 tabs daily as needed   dexamethasone 4 MG tablet Commonly known as:  DECADRON Take 1 tablet (4 mg total) by mouth 2 (two) times daily with a meal.   diphenoxylate-atropine 2.5-0.025 MG tablet Commonly known as:  LOMOTIL Take 1 tablet by mouth 4 (four) times daily as needed for diarrhea or loose stools.   ondansetron  8 MG tablet Commonly known as:  ZOFRAN Take 1 tablet (8 mg total) by mouth 2 (two) times daily as needed.   Oxycodone HCl 10 MG Tabs Take 1 tablet (10 mg total) by mouth at bedtime.   prochlorperazine 10 MG tablet Commonly known as:  COMPAZINE Take 1 tablet (10 mg total) by mouth every 6 (six) hours as needed (Nausea or vomiting).       Allergies: No Known Allergies  Family History: Family History  Problem Relation Age of Onset  . Cancer Maternal Uncle   . Cancer Paternal Aunt   . Kidney cancer Neg Hx   . Bladder Cancer Neg Hx     Social History:  reports that she quit smoking about 28 years ago. Her smoking use included Cigarettes. She has never used smokeless tobacco. She reports that she does not drink alcohol or use drugs.  ROS: UROLOGY Frequent Urination?: No Hard to postpone urination?: No Burning/pain with urination?: No Get up at night to urinate?: No Leakage of urine?: No Urine stream starts and stops?: No Trouble starting stream?: No Do you have to strain to urinate?: No Blood in urine?: No Urinary tract infection?: No Sexually transmitted disease?: No Injury to kidneys or bladder?: No Painful intercourse?: No Weak stream?: No Currently pregnant?: No Vaginal bleeding?: No Last menstrual period?: n  Gastrointestinal Nausea?: Yes Vomiting?: Yes Indigestion/heartburn?:  Yes Diarrhea?: Yes Constipation?: Yes  Constitutional Fever: No Night sweats?: No Weight loss?: No Fatigue?: Yes  Skin Skin rash/lesions?: No Itching?: No  Eyes Blurred vision?: No Double vision?: No  Ears/Nose/Throat Sore throat?: No Sinus problems?: No  Hematologic/Lymphatic Swollen glands?: No Easy bruising?: No  Cardiovascular Leg swelling?: No Chest pain?: No  Respiratory Cough?: No Shortness of breath?: No  Endocrine Excessive thirst?: No  Musculoskeletal Back pain?: No Joint pain?: No  Neurological Headaches?: No Dizziness?:  No  Psychologic Depression?: No Anxiety?: No  Physical Exam: BP 123/84   Pulse 97   Ht 5\' 5"  (1.651 m)   Wt 265 lb (120.2 kg) Comment: pt reports  LMP 02/28/2017 (Approximate)   BMI 44.10 kg/m   Constitutional:  Alert and oriented, No acute distress.  Tearful throughout our encounter today. HEENT: [place] AT, moist mucus membranes.  Trachea midline, no masses. Cardiovascular: No clubbing, cyanosis, or edema. Respiratory: Normal respiratory effort, no increased work of breathing. GI: Abdomen is soft, nontender, nondistended, no abdominal masses.  Morbidly obese. GU: Right nephrostomy tube in place draining clear yellow urine. Skin: No rashes, bruises or suspicious lesions. Neurologic: Grossly intact, no focal deficits, moving all 4 extremities. Psychiatric: Normal mood and affect.  Laboratory Data: Lab Results  Component Value Date   WBC 2.6 (L) 04/01/2017   HGB 7.1 (L) 04/01/2017   HCT 21.1 (L) 04/01/2017   MCV 76.0 (L) 04/01/2017   PLT 211 04/01/2017    Lab Results  Component Value Date   CREATININE 1.65 (H) 04/01/2017    Urinalysis Results for orders placed or performed in visit on 03/30/17  CULTURE, URINE COMPREHENSIVE  Result Value Ref Range   Urine Culture, Comprehensive Preliminary report    Result 1 Comment     Pertinent Imaging: No new interval imaging  Assessment & Plan:    1. Hydronephrosis of right kidney Moderate to severe right hydroureteronephrosis down to the level of the pelvis secondary to obstructing  Managed with right nephrostomy tube, tolerating well Undergoing treatment for cervical cancer, chemo/ rads Recommend nephrostomy tube exchange ~2 weeks Will defer next decision for exchange after the above based on repeat imaging/ response to treatment OK to shower with occlusive dressing UCx today -right nephrostomy tube exchange  2. Acute renal failure, unspecified acute renal failure type ([place]) Initially thought to be secondary to  obstruction Minimal improvement with tube ? Nephrotoxic chemo Will follow Cr  3. Cervical cancer, FIGO stage IIIB (HCC) Plan as above Awaiting repeat imaging  Return in about 8 weeks (around 05/25/2017) for recheck.  Hollice Espy, MD  [place] [address] [place],  [address] [phone]  I spent 25 min with this patient of which greater than 50% was spent in counseling and coordination of care with the patient.

## 2017-03-31 ENCOUNTER — Observation Stay
Admission: EM | Admit: 2017-03-31 | Discharge: 2017-04-02 | Disposition: A | Discharge status: Home / Self Care | Payer: Medicaid Other | Attending: Internal Medicine | Admitting: Internal Medicine

## 2017-03-31 ENCOUNTER — Encounter: Payer: Self-pay | Admitting: Emergency Medicine

## 2017-03-31 DIAGNOSIS — N179 Acute kidney failure, unspecified: Secondary | ICD-10-CM | POA: Insufficient documentation

## 2017-03-31 DIAGNOSIS — R112 Nausea with vomiting, unspecified: Secondary | ICD-10-CM | POA: Diagnosis present

## 2017-03-31 DIAGNOSIS — Z79899 Other long term (current) drug therapy: Secondary | ICD-10-CM | POA: Insufficient documentation

## 2017-03-31 DIAGNOSIS — N141 Nephropathy induced by other drugs, medicaments and biological substances: Principal | ICD-10-CM | POA: Insufficient documentation

## 2017-03-31 DIAGNOSIS — R197 Diarrhea, unspecified: Secondary | ICD-10-CM

## 2017-03-31 DIAGNOSIS — N390 Urinary tract infection, site not specified: Secondary | ICD-10-CM | POA: Diagnosis not present

## 2017-03-31 DIAGNOSIS — C539 Malignant neoplasm of cervix uteri, unspecified: Secondary | ICD-10-CM | POA: Diagnosis not present

## 2017-03-31 DIAGNOSIS — Z936 Other artificial openings of urinary tract status: Secondary | ICD-10-CM | POA: Insufficient documentation

## 2017-03-31 DIAGNOSIS — Z87891 Personal history of nicotine dependence: Secondary | ICD-10-CM | POA: Diagnosis not present

## 2017-03-31 DIAGNOSIS — T451X5A Adverse effect of antineoplastic and immunosuppressive drugs, initial encounter: Secondary | ICD-10-CM | POA: Insufficient documentation

## 2017-03-31 DIAGNOSIS — E860 Dehydration: Secondary | ICD-10-CM | POA: Insufficient documentation

## 2017-03-31 LAB — CBC
HCT: 24.7 % — ABNORMAL LOW (ref 35.0–47.0)
HEMOGLOBIN: 8.2 g/dL — AB (ref 12.0–16.0)
MCH: 25.1 pg — ABNORMAL LOW (ref 26.0–34.0)
MCHC: 33.2 g/dL (ref 32.0–36.0)
MCV: 75.7 fL — ABNORMAL LOW (ref 80.0–100.0)
PLATELETS: 258 10*3/uL (ref 150–440)
RBC: 3.26 MIL/uL — ABNORMAL LOW (ref 3.80–5.20)
RDW: 16 % — ABNORMAL HIGH (ref 11.5–14.5)
WBC: 3.6 10*3/uL (ref 3.6–11.0)

## 2017-03-31 LAB — URINALYSIS, COMPLETE (UACMP) WITH MICROSCOPIC
BILIRUBIN URINE: NEGATIVE
Glucose, UA: NEGATIVE mg/dL
Ketones, ur: NEGATIVE mg/dL
Nitrite: NEGATIVE
PH: 5 (ref 5.0–8.0)
Protein, ur: 30 mg/dL — AB
SPECIFIC GRAVITY, URINE: 1.017 (ref 1.005–1.030)

## 2017-03-31 LAB — COMPREHENSIVE METABOLIC PANEL
ALBUMIN: 3.4 g/dL — AB (ref 3.5–5.0)
ALT: 14 U/L (ref 14–54)
ANION GAP: 8 (ref 5–15)
AST: 20 U/L (ref 15–41)
Alkaline Phosphatase: 88 U/L (ref 38–126)
BILIRUBIN TOTAL: 0.4 mg/dL (ref 0.3–1.2)
BUN: 33 mg/dL — ABNORMAL HIGH (ref 6–20)
CALCIUM: 8.8 mg/dL — AB (ref 8.9–10.3)
CO2: 24 mmol/L (ref 22–32)
Chloride: 102 mmol/L (ref 101–111)
Creatinine, Ser: 1.66 mg/dL — ABNORMAL HIGH (ref 0.44–1.00)
GFR calc non Af Amer: 35 mL/min — ABNORMAL LOW (ref >60)
GFR, EST AFRICAN AMERICAN: 41 mL/min — AB (ref >60)
GLUCOSE: 105 mg/dL — AB (ref 65–99)
Potassium: 4.3 mmol/L (ref 3.5–5.1)
Sodium: 134 mmol/L — ABNORMAL LOW (ref 135–145)
TOTAL PROTEIN: 7 g/dL (ref 6.5–8.1)

## 2017-03-31 LAB — LIPASE, BLOOD: Lipase: 22 U/L (ref 11–51)

## 2017-03-31 LAB — POC URINE PREG, ED: PREG TEST UR: NEGATIVE

## 2017-03-31 MED ORDER — PROMETHAZINE HCL 25 MG/ML IJ SOLN
25.0000 mg | Freq: Once | INTRAMUSCULAR | Status: AC
  Administered 2017-03-31: 25 mg via INTRAVENOUS

## 2017-03-31 MED ORDER — SODIUM CHLORIDE 0.9 % IV SOLN
Freq: Once | INTRAVENOUS | Status: AC
  Administered 2017-03-31: 22:00:00 via INTRAVENOUS

## 2017-03-31 MED ORDER — MORPHINE SULFATE (PF) 4 MG/ML IV SOLN
4.0000 mg | Freq: Once | INTRAVENOUS | Status: AC
Start: 2017-03-31 — End: 2017-03-31
  Administered 2017-03-31: 4 mg via INTRAVENOUS
  Filled 2017-03-31: qty 1

## 2017-03-31 MED ORDER — METOCLOPRAMIDE HCL 5 MG/ML IJ SOLN
10.0000 mg | Freq: Once | INTRAMUSCULAR | Status: AC
  Administered 2017-03-31: 10 mg via INTRAVENOUS
  Filled 2017-03-31: qty 2

## 2017-03-31 MED ORDER — PROMETHAZINE HCL 25 MG/ML IJ SOLN
INTRAMUSCULAR | Status: AC
  Filled 2017-03-31: qty 1

## 2017-03-31 MED ORDER — ONDANSETRON HCL 4 MG/2ML IJ SOLN
4.0000 mg | Freq: Once | INTRAMUSCULAR | Status: AC
  Administered 2017-03-31: 4 mg via INTRAVENOUS
  Filled 2017-03-31: qty 2

## 2017-03-31 MED ORDER — ONDANSETRON 4 MG PO TBDP: 4.0000 mg | ORAL_TABLET | Freq: Three times a day (TID) | ORAL | 0 refills | Status: DC | PRN

## 2017-03-31 MED ORDER — PROMETHAZINE HCL 25 MG RE SUPP: 25.0000 mg | Freq: Four times a day (QID) | RECTAL | 1 refills | Status: DC | PRN

## 2017-03-31 NOTE — ED Triage Notes (Signed)
Patient reports that she is currently receiving chemo for cervical cancer. Patient reports that she has been vomiting all day. Patient reports that she has been taking zofran at home but unable to keep it down.

## 2017-03-31 NOTE — ED Triage Notes (Signed)
Pt arrives POV to triage with c/o N/V/D. Pt is a cancer pt at [facility] and received her last round of chemo on Tuesday. Pt reports vomiting and diarrhea x6. Pt is no NAD at this time.

## 2017-03-31 NOTE — ED Provider Notes (Addendum)
[facility] Emergency Department Provider Note       Time seen: ----------------------------------------- 10:09 PM on 03/31/2017 -----------------------------------------     I have reviewed the triage vital signs and the nursing notes.   HISTORY   Chief Complaint Nausea and Diarrhea    HPI Patricia Rojas is a 51 y.o. female who presents to the ED for nausea, vomiting and diarrhea. Patient is a cancer patient at [facility] and received her last round of chemotherapy on Tuesday. Patient reports vomiting and diarrhea 6 times. Patient reports vomiting all day and can't keep anything down even with Zofran. Pain is currently 8 out of 10 in the lower abdomen he describes as cramping and she has had before.   Past Medical History:  Diagnosis Date  . Cancer (HCC)    cervical  . Constipation   . Hemorrhoids   . N&V (nausea and vomiting)   . PONV (postoperative nausea and vomiting)     Patient Active Problem List   Diagnosis Date Noted  . Goals of care, counseling/discussion 02/18/2017  . Uterine cancer ([place]) 02/07/2017    Past Surgical History:  Procedure Laterality Date  . CESAREAN SECTION  1986  . CHOLECYSTECTOMY    . HERNIA REPAIR    . IR GENERIC HISTORICAL  02/23/2017   IR NEPHROSTOMY PLACEMENT RIGHT 02/23/2017 [name], MD [facility]-INTERV RAD    Allergies Patient has no known allergies.  Social History Social History  Substance Use Topics  . Smoking status: Former Smoker    Types: Cigarettes    Quit date: 38  . Smokeless tobacco: Never Used  . Alcohol use No    Review of Systems Constitutional: Negative for fever. Cardiovascular: Negative for chest pain. Respiratory: Negative for shortness of breath. Gastrointestinal: Positive for abdominal pain, vomiting and diarrhea Genitourinary: Negative for dysuria. Musculoskeletal: Negative for back pain. Skin: Negative for rash. Neurological: Positive for headaches and weakness  10-point ROS  otherwise negative.  ____________________________________________   PHYSICAL EXAM:  VITAL SIGNS: ED Triage Vitals  Enc Vitals Group     BP 03/31/17 2205 (!) 141/85     Pulse Rate 03/31/17 2205 (!) 101     Resp 03/31/17 2205 18     Temp 03/31/17 2205 97.8 F (36.6 C)     Temp Source 03/31/17 2205 Oral     SpO2 03/31/17 2205 100 %     Weight 03/31/17 2206 265 lb (120.2 kg)     Height 03/31/17 2206 5\' 5"  (1.651 m)     Head Circumference --      Peak Flow --      Pain Score 03/31/17 2204 8     Pain Loc --      Pain Edu? --      Excl. in [place]? --     Constitutional: Alert and oriented. Mild distress Eyes: Conjunctivae are normal. PERRL. Normal extraocular movements. ENT   Head: Normocephalic and atraumatic.   Nose: No congestion/rhinnorhea.   Mouth/Throat: Mucous membranes are moist.   Neck: No stridor. Cardiovascular: Normal rate, regular rhythm. No murmurs, rubs, or gallops. Respiratory: Normal respiratory effort without tachypnea nor retractions. Breath sounds are clear and equal bilaterally. No wheezes/rales/rhonchi. Gastrointestinal: Soft and nontender. Normal bowel sounds Musculoskeletal: Nontender with normal range of motion in extremities. No lower extremity tenderness nor edema. Neurologic:  Normal speech and language. No gross focal neurologic deficits are appreciated.  Skin:  Skin is warm, dry and intact. No rash noted. Psychiatric: Mood and affect are normal.  Speech and behavior are normal.  ___________________________________________  ED COURSE:  Pertinent labs & imaging results that were available during my care of the patient were reviewed by me and considered in my medical decision making (see chart for details). Patient presents for vomiting and diarrhea, we will assess with labs and imaging as indicated. Patient will receive IV fluids and antiemetics.   Procedures ____________________________________________   LABS (pertinent  positives/negatives)  Labs Reviewed  COMPREHENSIVE METABOLIC PANEL - Abnormal; Notable for the following:       Result Value   Sodium 134 (*)    Glucose, Bld 105 (*)    BUN 33 (*)    Creatinine, Ser 1.66 (*)    Calcium 8.8 (*)    Albumin 3.4 (*)    GFR calc non Af Amer 35 (*)    GFR calc Af Amer 41 (*)    All other components within normal limits  CBC - Abnormal; Notable for the following:    RBC 3.26 (*)    Hemoglobin 8.2 (*)    HCT 24.7 (*)    MCV 75.7 (*)    MCH 25.1 (*)    RDW 16.0 (*)    All other components within normal limits  LIPASE, BLOOD  URINALYSIS, COMPLETE (UACMP) WITH MICROSCOPIC  POC URINE PREG, ED  ____________________________________________  FINAL ASSESSMENT AND PLAN  Vomiting and diarrhea, dehydration  Plan: Patient's labs were dictated above. Patient had presented for vomiting and diarrhea that is likely chemotherapy related.Currently she is finishing up her second liter of fluids and has received 2 IV antiemetics. She does not feel like she needs to be hospitalized. As long as her nausea and vomiting is improved she would be appropriate for outpatient follow-up.   Earleen Newport, MD   Note: This note was generated in part or whole with voice recognition software. Voice recognition is usually quite accurate but there are transcription errors that can and very often do occur. I apologize for any typographical errors that were not detected and corrected.     Earleen Newport, MD 03/31/17 [place], MD 03/31/17 2258

## 2017-03-31 NOTE — ED Notes (Signed)
Pt report improvement in nausea, but states when she moves to sit up or stand up nausea wave returns.  Dr. Owens Shark informed

## 2017-04-01 DIAGNOSIS — R112 Nausea with vomiting, unspecified: Secondary | ICD-10-CM | POA: Diagnosis present

## 2017-04-01 LAB — URINALYSIS, COMPLETE (UACMP) WITH MICROSCOPIC
Bacteria, UA: NONE SEEN
Bilirubin Urine: NEGATIVE
GLUCOSE, UA: NEGATIVE mg/dL
Ketones, ur: NEGATIVE mg/dL
NITRITE: POSITIVE — AB
PROTEIN: 100 mg/dL — AB
SPECIFIC GRAVITY, URINE: 1.008 (ref 1.005–1.030)
pH: 7 (ref 5.0–8.0)

## 2017-04-01 LAB — CBC
HCT: 21.1 % — ABNORMAL LOW (ref 35.0–47.0)
Hemoglobin: 7.1 g/dL — ABNORMAL LOW (ref 12.0–16.0)
MCH: 25.5 pg — ABNORMAL LOW (ref 26.0–34.0)
MCHC: 33.6 g/dL (ref 32.0–36.0)
MCV: 76 fL — ABNORMAL LOW (ref 80.0–100.0)
PLATELETS: 211 10*3/uL (ref 150–440)
RBC: 2.78 MIL/uL — ABNORMAL LOW (ref 3.80–5.20)
RDW: 16.9 % — AB (ref 11.5–14.5)
WBC: 2.6 10*3/uL — AB (ref 3.6–11.0)

## 2017-04-01 LAB — BASIC METABOLIC PANEL
Anion gap: 6 (ref 5–15)
BUN: 29 mg/dL — AB (ref 6–20)
CALCIUM: 8 mg/dL — AB (ref 8.9–10.3)
CO2: 23 mmol/L (ref 22–32)
CREATININE: 1.65 mg/dL — AB (ref 0.44–1.00)
Chloride: 107 mmol/L (ref 101–111)
GFR calc Af Amer: 41 mL/min — ABNORMAL LOW (ref >60)
GFR, EST NON AFRICAN AMERICAN: 35 mL/min — AB (ref >60)
GLUCOSE: 85 mg/dL (ref 65–99)
Potassium: 4.4 mmol/L (ref 3.5–5.1)
SODIUM: 136 mmol/L (ref 135–145)

## 2017-04-01 LAB — MAGNESIUM: Magnesium: 1.2 mg/dL — ABNORMAL LOW (ref 1.7–2.4)

## 2017-04-01 MED ORDER — ALPRAZOLAM 0.5 MG PO TABS: 0.2500 mg | ORAL_TABLET | Freq: Every day | ORAL | Status: DC | PRN

## 2017-04-01 MED ORDER — MAGNESIUM SULFATE 4 GM/100ML IV SOLN
4.0000 g | Freq: Once | INTRAVENOUS | Status: AC
  Administered 2017-04-01: 11:00:00 4 g via INTRAVENOUS
  Filled 2017-04-01: qty 100

## 2017-04-01 MED ORDER — PROCHLORPERAZINE EDISYLATE 5 MG/ML IJ SOLN
10.0000 mg | Freq: Four times a day (QID) | INTRAMUSCULAR | Status: DC | PRN
  Administered 2017-04-01: 20:00:00 10 mg via INTRAVENOUS
  Filled 2017-04-01 (×3): qty 2

## 2017-04-01 MED ORDER — DICYCLOMINE HCL 10 MG PO CAPS
10.0000 mg | ORAL_CAPSULE | Freq: Three times a day (TID) | ORAL | Status: DC
  Administered 2017-04-01 – 2017-04-02 (×5): 10 mg via ORAL
  Filled 2017-04-01 (×5): qty 1

## 2017-04-01 MED ORDER — METOCLOPRAMIDE HCL 5 MG/ML IJ SOLN
5.0000 mg | Freq: Four times a day (QID) | INTRAMUSCULAR | Status: DC
  Administered 2017-04-01 – 2017-04-02 (×6): 5 mg via INTRAVENOUS
  Filled 2017-04-01 (×6): qty 2

## 2017-04-01 MED ORDER — HYDROCODONE-ACETAMINOPHEN 5-325 MG PO TABS
1.0000 | ORAL_TABLET | ORAL | Status: DC | PRN
  Administered 2017-04-01: 1 via ORAL
  Filled 2017-04-01: qty 1

## 2017-04-01 MED ORDER — CIPROFLOXACIN HCL 500 MG PO TABS
250.0000 mg | ORAL_TABLET | Freq: Two times a day (BID) | ORAL | Status: DC
  Administered 2017-04-01 – 2017-04-02 (×3): 250 mg via ORAL
  Filled 2017-04-01 (×3): qty 1

## 2017-04-01 MED ORDER — SODIUM CHLORIDE 0.9 % IV SOLN: 8.0000 mg | Freq: Three times a day (TID) | INTRAVENOUS | Status: DC | PRN

## 2017-04-01 MED ORDER — ACETAMINOPHEN 650 MG RE SUPP: 650.0000 mg | Freq: Four times a day (QID) | RECTAL | Status: DC | PRN

## 2017-04-01 MED ORDER — SODIUM CHLORIDE 0.9 % IV SOLN
INTRAVENOUS | Status: AC
  Administered 2017-04-01: 02:00:00 via INTRAVENOUS

## 2017-04-01 MED ORDER — SODIUM CHLORIDE 0.9 % IV BOLUS (SEPSIS)
1000.0000 mL | Freq: Once | INTRAVENOUS | Status: AC
  Administered 2017-04-01: 11:00:00 1000 mL via INTRAVENOUS

## 2017-04-01 MED ORDER — ENSURE ENLIVE PO LIQD
237.0000 mL | Freq: Two times a day (BID) | ORAL | Status: DC
  Administered 2017-04-01: 14:00:00 237 mL via ORAL

## 2017-04-01 MED ORDER — SODIUM CHLORIDE 0.9 % IV SOLN
INTRAVENOUS | Status: DC
  Administered 2017-04-01 – 2017-04-02 (×2): via INTRAVENOUS

## 2017-04-01 MED ORDER — ONDANSETRON HCL 4 MG PO TABS: 8.0000 mg | ORAL_TABLET | Freq: Three times a day (TID) | ORAL | Status: DC | PRN

## 2017-04-01 MED ORDER — PREMIER PROTEIN SHAKE: 11.0000 [oz_av] | Freq: Two times a day (BID) | ORAL | Status: DC

## 2017-04-01 MED ORDER — DEXAMETHASONE 4 MG PO TABS
4.0000 mg | ORAL_TABLET | Freq: Two times a day (BID) | ORAL | Status: DC
  Administered 2017-04-01 – 2017-04-02 (×3): 4 mg via ORAL
  Filled 2017-04-01 (×3): qty 1

## 2017-04-01 MED ORDER — ENOXAPARIN SODIUM 40 MG/0.4ML ~~LOC~~ SOLN
40.0000 mg | Freq: Two times a day (BID) | SUBCUTANEOUS | Status: DC
  Administered 2017-04-01 (×2): 40 mg via SUBCUTANEOUS
  Filled 2017-04-01 (×3): qty 0.4

## 2017-04-01 MED ORDER — CIPROFLOXACIN HCL 250 MG PO TABS: 250.0000 mg | ORAL_TABLET | Freq: Two times a day (BID) | ORAL | 0 refills | Status: DC

## 2017-04-01 MED ORDER — MORPHINE SULFATE (PF) 2 MG/ML IV SOLN
2.0000 mg | Freq: Once | INTRAVENOUS | Status: AC
  Administered 2017-04-01: 06:00:00 2 mg via INTRAVENOUS
  Filled 2017-04-01: qty 1

## 2017-04-01 MED ORDER — ACETAMINOPHEN 325 MG PO TABS: 650.0000 mg | ORAL_TABLET | Freq: Four times a day (QID) | ORAL | Status: DC | PRN

## 2017-04-01 MED ORDER — ONDANSETRON HCL 4 MG/2ML IJ SOLN
INTRAMUSCULAR | Status: AC
Start: 2017-04-01 — End: 2017-04-01
  Filled 2017-04-01: qty 4

## 2017-04-01 MED ORDER — SODIUM CHLORIDE 0.9 % IV SOLN
8.0000 mg | Freq: Once | INTRAVENOUS | Status: AC
  Administered 2017-04-01: 8 mg via INTRAVENOUS
  Filled 2017-04-01: qty 4

## 2017-04-01 NOTE — Progress Notes (Signed)
Lovenox changed to 40 mg BID for BMI >40 and CrCl >30. 

## 2017-04-01 NOTE — ED Notes (Signed)
Pt transport to 104 

## 2017-04-01 NOTE — Discharge Instructions (Addendum)
Resume diet and activity as before ° ° °

## 2017-04-01 NOTE — H&P (Signed)
[place] at [place] NAME: Patricia Rojas    MR#:  [id number]  DATE OF BIRTH:  [birth year]  DATE OF ADMISSION:  03/31/2017  PRIMARY CARE PHYSICIAN: No PCP Per Patient   REQUESTING/REFERRING PHYSICIAN: Owens Shark, MD  CHIEF COMPLAINT:   Chief Complaint  Patient presents with  . Nausea  . Diarrhea    HISTORY OF PRESENT ILLNESS:  Patricia Rojas  is a 51 y.o. female who presents with Intractable nausea and vomiting with diarrhea. Patient is currently receiving chemotherapy, states she received her last infusion 5 days ago. Her symptoms have been progressive since that time. She states that today they got as bad as they've ever been, with uncontrollable nausea and vomiting and diarrhea. Here in the ED she was given multiple rounds of antiemetics, and still a difficult to control nausea and vomiting. Hospitalists were called for admission.  PAST MEDICAL HISTORY:   Past Medical History:  Diagnosis Date  . Cancer (HCC)    cervical  . Constipation   . Hemorrhoids   . N&V (nausea and vomiting)   . PONV (postoperative nausea and vomiting)     PAST SURGICAL HISTORY:   Past Surgical History:  Procedure Laterality Date  . CESAREAN SECTION  1986  . CHOLECYSTECTOMY    . HERNIA REPAIR    . IR GENERIC HISTORICAL  02/23/2017   IR NEPHROSTOMY PLACEMENT RIGHT 02/23/2017 [name], MD [facility]-INTER[facility] RAD    SOCIAL HISTORY:   Social History  Substance Use Topics  . Smoking status: Former Smoker    Types: Cigarettes    Quit date: 25  . Smokeless tobacco: Never Used  . Alcohol use No    FAMILY HISTORY:   Family History  Problem Relation Age of Onset  . Cancer Maternal Uncle   . Cancer Paternal Aunt   . Kidney cancer Neg Hx   . Bladder Cancer Neg Hx     DRUG ALLERGIES:  No Known Allergies  MEDICATIONS AT HOME:   Prior to Admission medications   Medication Sig Start Date End Date Taking? Authorizing Provider  ALPRAZolam Duanne Moron) 0.25 MG  tablet Take 1-2 tabs daily as needed 02/22/17   Lloyd Huger, MD  dexamethasone (DECADRON) 4 MG tablet Take 1 tablet (4 mg total) by mouth 2 (two) times daily with a meal. 02/20/17   Lloyd Huger, MD  diphenoxylate-atropine (LOMOTIL) 2.5-0.025 MG tablet Take 1 tablet by mouth 4 (four) times daily as needed for diarrhea or loose stools. 03/27/17   Lloyd Huger, MD  ondansetron (ZOFRAN ODT) 4 MG disintegrating tablet Take 1 tablet (4 mg total) by mouth every 8 (eight) hours as needed for nausea or vomiting. 03/31/17   Earleen Newport, MD  ondansetron (ZOFRAN) 8 MG tablet Take 1 tablet (8 mg total) by mouth 2 (two) times daily as needed. 02/14/17   Lloyd Huger, MD  Oxycodone HCl 10 MG TABS Take 1 tablet (10 mg total) by mouth at bedtime. Patient not taking: Reported on 03/30/2017 03/06/17   Lloyd Huger, MD  prochlorperazine (COMPAZINE) 10 MG tablet Take 1 tablet (10 mg total) by mouth every 6 (six) hours as needed (Nausea or vomiting). Patient not taking: Reported on 03/30/2017 02/14/17   Lloyd Huger, MD  promethazine (PHENERGAN) 25 MG suppository Place 1 suppository (25 mg total) rectally every 6 (six) hours as needed for nausea. 03/31/17 03/31/18  Earleen Newport, MD    REVIEW OF SYSTEMS:  Review of  Systems  Constitutional: Positive for malaise/fatigue. Negative for chills, fever and weight loss.  HENT: Negative for ear pain, hearing loss and tinnitus.   Eyes: Negative for blurred vision, double vision, pain and redness.  Respiratory: Negative for cough, hemoptysis and shortness of breath.   Cardiovascular: Negative for chest pain, palpitations, orthopnea and leg swelling.  Gastrointestinal: Positive for diarrhea, nausea and vomiting. Negative for abdominal pain and constipation.  Genitourinary: Negative for dysuria, frequency and hematuria.  Musculoskeletal: Negative for back pain, joint pain and neck pain.  Skin:       No acne, rash, or lesions   Neurological: Negative for dizziness, tremors, focal weakness and weakness.  Endo/Heme/Allergies: Negative for polydipsia. Does not bruise/bleed easily.  Psychiatric/Behavioral: Negative for depression. The patient is not nervous/anxious and does not have insomnia.      VITAL SIGNS:   Vitals:   03/31/17 2230 03/31/17 2249 03/31/17 2348 04/01/17 0041  BP: (!) 138/95  127/79 139/86  Pulse: (!) 107 92 88 92  Resp:   18 18  Temp:      TempSrc:      SpO2: 99% 100% 100% 100%  Weight:      Height:       Wt Readings from Last 3 Encounters:  03/31/17 120.2 kg (265 lb)  03/30/17 120.2 kg (265 lb)  03/13/17 120.4 kg (265 lb 6.9 oz)    PHYSICAL EXAMINATION:  Physical Exam  Vitals reviewed. Constitutional: She is oriented to person, place, and time. She appears well-developed and well-nourished. No distress.  HENT:  Head: Normocephalic and atraumatic.  Dry mucous membranes  Eyes: Conjunctivae and EOM are normal. Pupils are equal, round, and reactive to light. No scleral icterus.  Neck: Normal range of motion. Neck supple. No JVD present. No thyromegaly present.  Cardiovascular: Normal rate, regular rhythm and intact distal pulses.  Exam reveals no gallop and no friction rub.   No murmur heard. Respiratory: Effort normal and breath sounds normal. No respiratory distress. She has no wheezes. She has no rales.  GI: Soft. Bowel sounds are normal. She exhibits no distension. There is tenderness.  Musculoskeletal: Normal range of motion. She exhibits no edema.  No arthritis, no gout  Lymphadenopathy:    She has no cervical adenopathy.  Neurological: She is alert and oriented to person, place, and time. No cranial nerve deficit.  No dysarthria, no aphasia  Skin: Skin is warm and dry. No rash noted. No erythema.  Psychiatric: She has a normal mood and affect. Her behavior is normal. Judgment and thought content normal.    LABORATORY PANEL:   CBC  Recent Labs Lab 03/31/17 2208  WBC  3.6  HGB 8.2*  HCT 24.7*  PLT 258   ------------------------------------------------------------------------------------------------------------------  Chemistries   Recent Labs Lab 03/27/17 0850 03/31/17 2208  NA  --  134*  K  --  4.3  CL  --  102  CO2  --  24  GLUCOSE  --  105*  BUN  --  33*  CREATININE  --  1.66*  CALCIUM  --  8.8*  MG 1.3*  --   AST  --  20  ALT  --  14  ALKPHOS  --  88  BILITOT  --  0.4   ------------------------------------------------------------------------------------------------------------------  Cardiac Enzymes No results for input(s): TROPONINI in the last 168 hours. ------------------------------------------------------------------------------------------------------------------  RADIOLOGY:  No results found.  EKG:   Orders placed or performed during the hospital encounter of 01/17/16  . ED EKG within 10  minutes  . ED EKG within 10 minutes  . EKG    IMPRESSION AND PLAN:  Principal Problem:   Intractable nausea and vomiting - this is almost certainly due to chemotherapy. She denies any other significant symptoms of infection, and her lab work is relatively within normal limits. We will treat her with when necessary antiemetics and IV fluids. Keep her nothing by mouth for now. Active Problems:   Uterine cancer ([place]) - receiving chemotherapy for the same, likely cause of her above symptoms.  All the records are reviewed and case discussed with ED provider. Management plans discussed with the patient and/or family.  DVT PROPHYLAXIS: SubQ lovenox  GI PROPHYLAXIS: None  ADMISSION STATUS: Observation  CODE STATUS: Full Code Status History    This patient does not have a recorded code status. Please follow your organizational policy for patients in this situation.      TOTAL TIME TAKING CARE OF THIS PATIENT: 40 minutes.   Mar Walmer [place] 04/01/2017, 12:44 AM  [facility]  Office   [phone]  CC: Primary care physician; No PCP Per Patient  Note:  This document was prepared using Dragon voice recognition software and may include unintentional dictation errors.

## 2017-04-01 NOTE — Progress Notes (Addendum)
Initial Nutrition Assessment  DOCUMENTATION CODES:   Obesity unspecified  INTERVENTION:  1. Ensure Enlive po BID, each supplement provides 350 kcal and 20 grams of protein 2. Provided handout on taste and smell changes from [employer]  NUTRITION DIAGNOSIS:   Malnutrition related to chronic illness as evidenced by percent weight loss, energy intake < 75% for > or equal to 1 month.  GOAL:   Patient will meet greater than or equal to 90% of their needs  MONITOR:   PO intake, Supplement acceptance, I & O's, Labs, Weight trends  REASON FOR ASSESSMENT:   Malnutrition Screening Tool    ASSESSMENT:   Patricia Rojas  is a 51 y.o. female who presents with Intractable nausea and vomiting with diarrhea. Patient is currently receiving chemotherapy, states she received her last infusion 5 days ago. Her symptoms have been progressive since that time  Spoke with Ms. Putnam at bedside. She reports intractable nausea/vomiting since Tuesday - reports it has improved dramatically since admission. Ate a biscuit from [employer] this morning her boyfriend brought her. First thing she had eaten since Tuesday. Patient states that prior to this onset of intractable nausea/vomiting - she was still eating only 1 small meal per day that boyfriend normally buys- related to poor appetite. She also endorses taste changes - foods tasting bland or like metallic. Does not like Soda anymore - eats more fruits and vegetables - seems her stomach is easily upset.  Reports 50#/15% severe wt loss since February  Asking if she will go home today.  Nutrition-Focused physical exam completed. Findings are no fat depletion, no muscle depletion, and no edema.   Labs and medications reviewed: Mg 1.2 Reglan, Decadron    Diet Order:  DIET SOFT Room service appropriate? Yes; Fluid consistency: Thin  Skin:  Reviewed, no issues  Last BM:  03/31/2017  Height:   Ht Readings from Last 1 Encounters:  04/01/17 5'  5" (1.651 m)    Weight:   Wt Readings from Last 1 Encounters:  04/01/17 272 lb 11.2 oz (123.7 kg)    Ideal Body Weight:  56.81 kg  BMI:  Body mass index is 45.38 kg/m.  Estimated Nutritional Needs:   Kcal:  2200-2500 calories  Protein:  148-185 grams  Fluid:  >/= 2.2L  EDUCATION NEEDS:   Education needs addressed  Satira Anis. Dequarius Jeffries, MS, RD LDN Inpatient Clinical Dietitian Pager [phone]

## 2017-04-02 ENCOUNTER — Ambulatory Visit: Payer: Medicaid Other

## 2017-04-02 ENCOUNTER — Encounter: Payer: Self-pay | Admitting: Internal Medicine

## 2017-04-02 ENCOUNTER — Telehealth: Payer: Self-pay | Admitting: Radiology

## 2017-04-02 ENCOUNTER — Other Ambulatory Visit: Payer: Self-pay | Admitting: Radiology

## 2017-04-02 DIAGNOSIS — N1330 Unspecified hydronephrosis: Secondary | ICD-10-CM

## 2017-04-02 LAB — CBC WITH DIFFERENTIAL/PLATELET
BASOS PCT: 0 %
Basophils Absolute: 0 10*3/uL (ref 0–0.1)
EOS ABS: 0 10*3/uL (ref 0–0.7)
EOS PCT: 0 %
HCT: 22.6 % — ABNORMAL LOW (ref 35.0–47.0)
Hemoglobin: 7.7 g/dL — ABNORMAL LOW (ref 12.0–16.0)
LYMPHS PCT: 6 %
Lymphs Abs: 0.2 10*3/uL — ABNORMAL LOW (ref 1.0–3.6)
MCH: 25.9 pg — ABNORMAL LOW (ref 26.0–34.0)
MCHC: 33.9 g/dL (ref 32.0–36.0)
MCV: 76.3 fL — ABNORMAL LOW (ref 80.0–100.0)
MONO ABS: 0.2 10*3/uL (ref 0.2–0.9)
MONOS PCT: 6 %
NEUTROS ABS: 3.3 10*3/uL (ref 1.4–6.5)
Neutrophils Relative %: 88 %
PLATELETS: 250 10*3/uL (ref 150–440)
RBC: 2.96 MIL/uL — ABNORMAL LOW (ref 3.80–5.20)
RDW: 16.2 % — AB (ref 11.5–14.5)
WBC: 3.8 10*3/uL (ref 3.6–11.0)

## 2017-04-02 LAB — BASIC METABOLIC PANEL
Anion gap: 6 (ref 5–15)
BUN: 26 mg/dL — ABNORMAL HIGH (ref 6–20)
CHLORIDE: 107 mmol/L (ref 101–111)
CO2: 20 mmol/L — ABNORMAL LOW (ref 22–32)
Calcium: 8.7 mg/dL — ABNORMAL LOW (ref 8.9–10.3)
Creatinine, Ser: 1.41 mg/dL — ABNORMAL HIGH (ref 0.44–1.00)
GFR calc Af Amer: 49 mL/min — ABNORMAL LOW (ref >60)
GFR, EST NON AFRICAN AMERICAN: 43 mL/min — AB (ref >60)
GLUCOSE: 149 mg/dL — AB (ref 65–99)
Potassium: 4.6 mmol/L (ref 3.5–5.1)
SODIUM: 133 mmol/L — AB (ref 135–145)

## 2017-04-02 LAB — CULTURE, URINE COMPREHENSIVE

## 2017-04-02 LAB — MAGNESIUM: MAGNESIUM: 1.8 mg/dL (ref 1.7–2.4)

## 2017-04-02 MED ORDER — AMLODIPINE BESYLATE 2.5 MG PO TABS: 2.5000 mg | ORAL_TABLET | Freq: Every day | ORAL | 0 refills | Status: DC

## 2017-04-02 MED ORDER — AMLODIPINE BESYLATE 5 MG PO TABS
2.5000 mg | ORAL_TABLET | Freq: Every day | ORAL | Status: DC
  Administered 2017-04-02: 2.5 mg via ORAL
  Filled 2017-04-02: qty 1

## 2017-04-02 NOTE — Progress Notes (Signed)
[place], [place].   04/02/2017  Patient: Patricia Rojas   Date of Birth:  11-25-1966  Date of admission:  03/31/2017  Date of Discharge  04/02/2017    To Whom it May Concern:   Patricia Rojas  Was admitted to [facility] for above dates. Her husband was here today to pick her up at discharge.   If you have any questions or concerns, please don't hesitate to call.  Sincerely,   Hillary Bow R M.D Office : [phone]   .

## 2017-04-02 NOTE — Telephone Encounter (Signed)
-----   Message from Hollice Espy, MD sent at 04/01/2017  2:06 PM EDT ----- Tamar Miano, please arrange for next tube exchange in ~2 weeks

## 2017-04-02 NOTE — Progress Notes (Signed)
Patricia Rojas at [place] NAME: Patricia Rojas    MR#:  [id number]  DATE OF BIRTH:  06/05/1966  SUBJECTIVE:  CHIEF COMPLAINT:   Chief Complaint  Patient presents with  . Nausea  . Diarrhea   NO vomiting Some diarrhea  REVIEW OF SYSTEMS:    Review of Systems  Constitutional: Positive for malaise/fatigue. Negative for chills and fever.  HENT: Negative for sore throat.   Eyes: Negative for blurred vision, double vision and pain.  Respiratory: Negative for cough, hemoptysis, shortness of breath and wheezing.   Cardiovascular: Negative for chest pain, palpitations, orthopnea and leg swelling.  Gastrointestinal: Positive for abdominal pain. Negative for constipation, diarrhea, heartburn, nausea and vomiting.  Genitourinary: Negative for dysuria and hematuria.  Musculoskeletal: Negative for back pain and joint pain.  Skin: Negative for rash.  Neurological: Positive for weakness. Negative for sensory change, speech change, focal weakness and headaches.  Endo/Heme/Allergies: Does not bruise/bleed easily.  Psychiatric/Behavioral: Negative for depression. The patient is not nervous/anxious.     DRUG ALLERGIES:  No Known Allergies  VITALS:  Blood pressure (!) 161/96, pulse 92, temperature 97.8 F (36.6 C), temperature source Oral, resp. rate 20, height 5\' 5"  (1.651 m), weight 123.7 kg (272 lb 11.2 oz), last menstrual period 02/28/2017, SpO2 94 %.  PHYSICAL EXAMINATION:   Physical Exam  GENERAL:  51 y.o.-year-old patient lying in the bed with no acute distress.  EYES: Pupils equal, round, reactive to light and accommodation. No scleral icterus. Extraocular muscles intact.  HEENT: Head atraumatic, normocephalic. Oropharynx and nasopharynx clear.  NECK:  Supple, no jugular venous distention. No thyroid enlargement, no tenderness.  LUNGS: Normal breath sounds bilaterally, no wheezing, rales, rhonchi. No use of accessory muscles of respiration.   CARDIOVASCULAR: S1, S2 normal. No murmurs, rubs, or gallops.  ABDOMEN: Soft, nontender, nondistended. Bowel sounds present. No organomegaly or mass.  EXTREMITIES: No cyanosis, clubbing or edema b/l.    NEUROLOGIC: Cranial nerves II through XII are intact. No focal Motor or sensory deficits b/l.   PSYCHIATRIC: The patient is alert and oriented x 3.  SKIN: No obvious rash, lesion, or ulcer.   LABORATORY PANEL:   CBC  Recent Labs Lab 04/02/17 0705  WBC 3.8  HGB 7.7*  HCT 22.6*  PLT 250   ------------------------------------------------------------------------------------------------------------------ Chemistries   Recent Labs Lab 03/31/17 2208  04/02/17 0705  NA 134*  < > 133*  K 4.3  < > 4.6  CL 102  < > 107  CO2 24  < > 20*  GLUCOSE 105*  < > 149*  BUN 33*  < > 26*  CREATININE 1.66*  < > 1.41*  CALCIUM 8.8*  < > 8.7*  MG  --   < > 1.8  AST 20  --   --   ALT 14  --   --   ALKPHOS 88  --   --   BILITOT 0.4  --   --   < > = values in this interval not displayed. ------------------------------------------------------------------------------------------------------------------  Cardiac Enzymes No results for input(s): TROPONINI in the last 168 hours. ------------------------------------------------------------------------------------------------------------------  RADIOLOGY:  No results found.   ASSESSMENT AND PLAN:   * Vomiting and diarrhea due to chemotherapy Improving  * Dehydration Bolus NS now and continue IVF  * Cervical cancer OP f/u with oncology  * Hypomagnesiemia due to diarrhea Replace thru IV  All the records are reviewed and case discussed with Care Management/Social Workerr. Management plans discussed with the  patient, family and they are in agreement.  CODE STATUS: FULL CODE  DVT Prophylaxis: SCDs  TOTAL TIME TAKING CARE OF THIS PATIENT: 30 minutes.   POSSIBLE D/C IN 1-2 DAYS, DEPENDING ON CLINICAL CONDITION.  Hillary Bow R M.D  on 04/02/2017 at 3:21 PM  Between 7am to 6pm - Pager - [phone]  After 6pm go to [email or web address] - password [email or web address] [facility]  Office  [phone]  CC: Primary care physician; No PCP Per Patient  Note: This dictation was prepared with Dragon dictation along with smaller phrase technology. Any transcriptional errors that result from this process are unintentional.

## 2017-04-02 NOTE — Plan of Care (Signed)
MD made rounds. Received order to discharge home. IV removed. Prescriptions given to patient. Discharge paperwork provided, explained, signed and witnessed. No unanswered questions. Discharged via wheelchair by auxiliary  staff. Belongings sent with patient and family.

## 2017-04-03 ENCOUNTER — Inpatient Hospital Stay: Payer: Medicaid Other

## 2017-04-03 ENCOUNTER — Inpatient Hospital Stay: Payer: Medicaid Other | Admitting: Oncology

## 2017-04-03 LAB — URINE CULTURE

## 2017-04-03 NOTE — Telephone Encounter (Signed)
Notified pt of nephrostomy tube exchange scheduled 04/18/17 with arrival to [place] at 12:30 for 1:30 appt. Advised pt to have a driver present for appt & be npo after mn. Pt voices understanding.

## 2017-04-05 NOTE — Discharge Summary (Signed)
[facility] [place] NAME: Patricia Rojas    MR#:  [id number]  DATE OF BIRTH:  Nov 10, 1966  DATE OF ADMISSION:  03/31/2017 ADMITTING PHYSICIAN: Lance Coon, MD  DATE OF DISCHARGE: 04/02/2017 11:46 AM  PRIMARY CARE PHYSICIAN: No PCP Per Patient   ADMISSION DIAGNOSIS:  Dehydration [E86.0] Nausea vomiting and diarrhea [R11.2, R19.7]  DISCHARGE DIAGNOSIS:  Principal Problem:   Intractable nausea and vomiting Active Problems:   Uterine cancer ([place])   SECONDARY DIAGNOSIS:   Past Medical History:  Diagnosis Date  . Cancer (HCC)    cervical  . Constipation   . Hemorrhoids   . N&V (nausea and vomiting)   . PONV (postoperative nausea and vomiting)      ADMITTING HISTORY  HISTORY OF PRESENT ILLNESS:  Patricia Rojas  is a 51 y.o. female who presents with Intractable nausea and vomiting with diarrhea. Patient is currently receiving chemotherapy, states she received her last infusion 5 days ago. Her symptoms have been progressive since that time. She states that today they got as bad as they've ever been, with uncontrollable nausea and vomiting and diarrhea. Here in the ED she was given multiple rounds of antiemetics, and still a difficult to control nausea and vomiting. Hospitalists were called for admission.   HOSPITAL COURSE:   * Vomiting and diarrhea with dehydration and acute kidney injury due to chemotherapy. Patient was aggressively hydrated with IV fluids and increase oral intake.. Symptomatically with Zofran. By the day of discharge patient has no further vomiting or diarrhea. Tolerating regular food. Creatinine has trended down. Afebrile. No abdominal pain.  * UTI. Patient does have a nephrostomy tube in with ongoing chemotherapy is being started on antibiotics although it is not confirmed she has a UTI with urinalysis. Cultures are pending. 5 more days of ciprofloxacin. Follow-up with urology and oncology as outpatient.  Cervical cancer.  Patient follows at [facility] for chemotherapy.  Stable for discharge home.  CONSULTS OBTAINED:    DRUG ALLERGIES:  No Known Allergies  DISCHARGE MEDICATIONS:   Discharge Medication List as of 04/02/2017 10:35 AM    START taking these medications   Details  amLODipine (NORVASC) 2.5 MG tablet Take 1 tablet (2.5 mg total) by mouth daily., Starting Mon 04/02/2017, Print    ciprofloxacin (CIPRO) 250 MG tablet Take 1 tablet (250 mg total) by mouth 2 (two) times daily., Starting Sun 04/01/2017, Normal    ondansetron (ZOFRAN ODT) 4 MG disintegrating tablet Take 1 tablet (4 mg total) by mouth every 8 (eight) hours as needed for nausea or vomiting., Starting Sat 03/31/2017, Print    promethazine (PHENERGAN) 25 MG suppository Place 1 suppository (25 mg total) rectally every 6 (six) hours as needed for nausea., Starting Sat 03/31/2017, Until Sun 03/31/2018, Print      CONTINUE these medications which have NOT CHANGED   Details  ALPRAZolam (XANAX) 0.25 MG tablet Take 1-2 tabs daily as needed, Print    dexamethasone (DECADRON) 4 MG tablet Take 1 tablet (4 mg total) by mouth 2 (two) times daily with a meal., Starting Tue 02/20/2017, Normal    diphenoxylate-atropine (LOMOTIL) 2.5-0.025 MG tablet Take 1 tablet by mouth 4 (four) times daily as needed for diarrhea or loose stools., Starting Tue 03/27/2017, Print    ondansetron (ZOFRAN) 8 MG tablet Take 1 tablet (8 mg total) by mouth 2 (two) times daily as needed., Starting Wed 02/14/2017, Normal    Oxycodone HCl 10 MG TABS Take 1 tablet (10 mg  total) by mouth at bedtime., Starting Tue 03/06/2017, Print        Today   VITAL SIGNS:  Blood pressure (!) 161/96, pulse 92, temperature 97.8 F (36.6 C), temperature source Oral, resp. rate 20, height 5\' 5"  (1.651 m), weight 123.7 kg (272 lb 11.2 oz), last menstrual period 02/28/2017, SpO2 94 %.  I/O:  No intake or output data in the 24 hours ending 04/05/17 1325  PHYSICAL EXAMINATION:  Physical  Exam  GENERAL:  51 y.o.-year-old patient lying in the bed with no acute distress.  LUNGS: Normal breath sounds bilaterally, no wheezing, rales,rhonchi or crepitation. No use of accessory muscles of respiration.  CARDIOVASCULAR: S1, S2 normal. No murmurs, rubs, or gallops.  ABDOMEN: Soft, non-tender, non-distended. Bowel sounds present. No organomegaly or mass.  NEUROLOGIC: Moves all 4 extremities. PSYCHIATRIC: The patient is alert and oriented x 3.  SKIN: No obvious rash, lesion, or ulcer.   DATA REVIEW:   CBC  Recent Labs Lab 04/02/17 0705  WBC 3.8  HGB 7.7*  HCT 22.6*  PLT 250    Chemistries   Recent Labs Lab 03/31/17 2208  04/02/17 0705  NA 134*  < > 133*  K 4.3  < > 4.6  CL 102  < > 107  CO2 24  < > 20*  GLUCOSE 105*  < > 149*  BUN 33*  < > 26*  CREATININE 1.66*  < > 1.41*  CALCIUM 8.8*  < > 8.7*  MG  --   < > 1.8  AST 20  --   --   ALT 14  --   --   ALKPHOS 88  --   --   BILITOT 0.4  --   --   < > = values in this interval not displayed.  Cardiac Enzymes No results for input(s): TROPONINI in the last 168 hours.  Microbiology Results  Results for orders placed or performed during the hospital encounter of 03/31/17  Urine culture     Status: Abnormal   Collection Time: 04/01/17  7:00 AM  Result Value Ref Range Status   Specimen Description URINE, RANDOM  Final   Special Requests Immunocompromised  Final   Culture MULTIPLE SPECIES PRESENT, SUGGEST RECOLLECTION (A)  Final   Report Status 04/03/2017 FINAL  Final    RADIOLOGY:  No results found.  Follow up with PCP in 1 week.  Management plans discussed with the patient, family and they are in agreement.  CODE STATUS:  Code Status History    Date Active Date Inactive Code Status Order ID Comments User Context   04/01/2017  1:09 AM 04/02/2017  2:46 PM Full Code [id number]  Lance Coon, MD ED      TOTAL TIME TAKING CARE OF THIS PATIENT ON DAY OF DISCHARGE: more than 30 minutes.   Hillary Bow R  M.D on 04/05/2017 at 1:25 PM  Between 7am to 6pm - Pager - [phone]  After 6pm go to [email or web address] - password [email or web address] [facility]  Office  [phone]  CC: Primary care physician; No PCP Per Patient  Note: This dictation was prepared with Dragon dictation along with smaller phrase technology. Any transcriptional errors that result from this process are unintentional.

## 2017-04-09 ENCOUNTER — Ambulatory Visit (INDEPENDENT_AMBULATORY_CARE_PROVIDER_SITE_OTHER): Payer: Self-pay

## 2017-04-09 VITALS — BP 129/91 | HR 120 | Ht 65.0 in | Wt 267.1 lb

## 2017-04-09 DIAGNOSIS — N1330 Unspecified hydronephrosis: Secondary | ICD-10-CM

## 2017-04-09 NOTE — Progress Notes (Signed)
Pt presented today with c/o neph tubing leaking. Neph tubing replaced. Pt tolerated well. No s/s of adverse reaction noted.  Blood pressure (!) 129/91, pulse (!) 120, height 5\' 5"  (1.651 m), weight 267 lb 1.6 oz (121.2 kg).

## 2017-04-17 ENCOUNTER — Other Ambulatory Visit: Payer: Self-pay | Admitting: Radiology

## 2017-04-18 ENCOUNTER — Encounter: Payer: Self-pay | Admitting: Diagnostic Radiology

## 2017-04-18 ENCOUNTER — Ambulatory Visit
Admission: RE | Admit: 2017-04-18 | Discharge: 2017-04-18 | Disposition: A | Discharge status: Home / Self Care | Payer: Medicaid Other | Source: Ambulatory Visit | Attending: Urology | Admitting: Urology

## 2017-04-18 DIAGNOSIS — N1330 Unspecified hydronephrosis: Secondary | ICD-10-CM

## 2017-04-18 DIAGNOSIS — N131 Hydronephrosis with ureteral stricture, not elsewhere classified: Secondary | ICD-10-CM | POA: Insufficient documentation

## 2017-04-18 DIAGNOSIS — C539 Malignant neoplasm of cervix uteri, unspecified: Secondary | ICD-10-CM | POA: Insufficient documentation

## 2017-04-18 DIAGNOSIS — Z436 Encounter for attention to other artificial openings of urinary tract: Secondary | ICD-10-CM | POA: Insufficient documentation

## 2017-04-18 HISTORY — PX: IR NEPHROSTOMY EXCHANGE RIGHT: IMG6070

## 2017-04-18 MED ORDER — IOPAMIDOL (ISOVUE-300) INJECTION 61%
30.0000 mL | Freq: Once | INTRAVENOUS | Status: AC | PRN
  Administered 2017-04-18: 15:00:00 10 mL via INTRAVENOUS

## 2017-04-18 MED ORDER — LIDOCAINE HCL (PF) 1 % IJ SOLN
INTRAMUSCULAR | Status: AC
  Filled 2017-04-18: qty 30

## 2017-04-18 NOTE — Procedures (Signed)
Successful exchange of the right nephrostomy tube.  Minimal blood loss.  No immediate complication.

## 2017-05-09 ENCOUNTER — Ambulatory Visit
Admission: RE | Admit: 2017-05-09 | Discharge: 2017-05-09 | Disposition: A | Discharge status: Home / Self Care | Payer: Medicaid Other | Source: Ambulatory Visit | Attending: Radiation Oncology | Admitting: Radiation Oncology

## 2017-05-09 ENCOUNTER — Encounter: Payer: Self-pay | Admitting: Radiation Oncology

## 2017-05-09 VITALS — BP 146/90 | HR 107 | Temp 97.7°F | Resp 20 | Wt 260.9 lb

## 2017-05-09 DIAGNOSIS — R197 Diarrhea, unspecified: Secondary | ICD-10-CM | POA: Insufficient documentation

## 2017-05-09 DIAGNOSIS — Z923 Personal history of irradiation: Secondary | ICD-10-CM | POA: Insufficient documentation

## 2017-05-09 DIAGNOSIS — K5900 Constipation, unspecified: Secondary | ICD-10-CM | POA: Insufficient documentation

## 2017-05-09 DIAGNOSIS — C538 Malignant neoplasm of overlapping sites of cervix uteri: Secondary | ICD-10-CM

## 2017-05-09 DIAGNOSIS — C530 Malignant neoplasm of endocervix: Secondary | ICD-10-CM | POA: Diagnosis present

## 2017-05-09 NOTE — Progress Notes (Signed)
Radiation Oncology Follow up Note  Name: Patricia Rojas   Date:   05/09/2017 MRN:  [id number] DOB: 03-Jan-1966    This 51 y.o. female presents to the clinic today for one-month follow-up status post external beam radiation therapy as well as brachytherapy for. Locally advanced carcinoma of the endocervix  REFERRING PROVIDER: No ref. provider found  HPI: Patient is a 51 year old female now out 1 month having completed both external beam radiation therapy in our department as well as brachytherapy at [facility] for locally advanced endocervix carcinoma.Marland Kitchen PET CT scan demonstrated marked hyperbolic activity in central no necrotic lower uterine segment mass. She also had evidence of hypermetabolic pelvic nodal involvement. She had 5 fractions of high-dose rate brachytherapy [facility] which she tolerated well. She seen today in routine follow-up. Slowly her bowels are recovering she still has intermittent constipation and diarrhea. No significant urinary problems.  COMPLICATIONS OF TREATMENT: none  FOLLOW UP COMPLIANCE: keeps appointments   PHYSICAL EXAM:  BP (!) 146/90   Pulse (!) 107   Temp 97.7 F (36.5 C)   Resp 20   Wt 260 lb 14.6 oz (118.3 kg)   BMI 43.42 kg/m  On speculum examination vaginal vault shows some slight telangiectatic changes at the vaginal apex. No evidence of ulceration is noted. Bimanual examination shows no evidence of palpable mass or nodularity. Well-developed well-nourished patient in NAD. HEENT reveals PERLA, EOMI, discs not visualized.  Oral cavity is clear. No oral mucosal lesions are identified. Neck is clear without evidence of cervical or supraclavicular adenopathy. Lungs are clear to A&P. Cardiac examination is essentially unremarkable with regular rate and rhythm without murmur rub or thrill. Abdomen is benign with no organomegaly or masses noted. Motor sensory and DTR levels are equal and symmetric in the upper and lower extremities. Cranial nerves II through XII  are grossly intact. Proprioception is intact. No peripheral adenopathy or edema is identified. No motor or sensory levels are noted. Crude visual fields are within normal range.  RADIOLOGY RESULTS: No current films for review  PLAN: Present time patient is recovering well. I have scheduled a 3 to four-month follow-up with GYN oncology at the same time. Also will get imaging with CT scan of her abdomen and pelvis prior to that visit for our review. Otherwise I'm please were overall progress. Patient knows to call with any concerns.  I would like to take this opportunity to thank you for allowing me to participate in the care of your patient.Armstead Peaks., MD

## 2017-05-13 ENCOUNTER — Encounter: Payer: Self-pay | Admitting: Emergency Medicine

## 2017-05-13 ENCOUNTER — Emergency Department
Admission: EM | Admit: 2017-05-13 | Discharge: 2017-05-13 | Disposition: A | Discharge status: Home / Self Care | Payer: Medicaid Other | Attending: Emergency Medicine | Admitting: Emergency Medicine

## 2017-05-13 DIAGNOSIS — Z9049 Acquired absence of other specified parts of digestive tract: Secondary | ICD-10-CM | POA: Diagnosis not present

## 2017-05-13 DIAGNOSIS — T83032A Leakage of nephrostomy catheter, initial encounter: Secondary | ICD-10-CM | POA: Diagnosis not present

## 2017-05-13 DIAGNOSIS — Y731 Therapeutic (nonsurgical) and rehabilitative gastroenterology and urology devices associated with adverse incidents: Secondary | ICD-10-CM | POA: Diagnosis not present

## 2017-05-13 DIAGNOSIS — Z87891 Personal history of nicotine dependence: Secondary | ICD-10-CM | POA: Insufficient documentation

## 2017-05-13 DIAGNOSIS — Z79899 Other long term (current) drug therapy: Secondary | ICD-10-CM | POA: Diagnosis not present

## 2017-05-13 DIAGNOSIS — C539 Malignant neoplasm of cervix uteri, unspecified: Secondary | ICD-10-CM | POA: Insufficient documentation

## 2017-05-13 NOTE — ED Notes (Signed)
Bag to nephrostomy tube changed per protocol with aseptic technique.

## 2017-05-13 NOTE — ED Provider Notes (Signed)
[facility] Emergency Department Provider Note  Time seen: 8:24 AM  I have reviewed the triage vital signs and the nursing notes.   HISTORY  Chief Complaint Leaking Catheter    HPI Patricia Rojas is a 51 y.o. female with a past medical history cervical cancer, status post right nephrostomy tube presents to the emergency department for urine leakage. According to the patient she awoke this morning with urine leaking out of her leg bag onto the bed so she came to the emergency department for evaluation. Denies any leakage at the nephrostomy insertion site. Denies any pain at the insertion site. Denies any back pain, abdominal pain, fever, nausea, vomiting, diarrhea. Patient still produces urine from the left kidney and denies any dysuria. Patient's only complaint is leakage around the leg bag.  Past Medical History:  Diagnosis Date  . Cancer (HCC)    cervical  . Constipation   . Hemorrhoids   . N&V (nausea and vomiting)   . PONV (postoperative nausea and vomiting)     Patient Active Problem List   Diagnosis Date Noted  . Intractable nausea and vomiting 04/01/2017  . Goals of care, counseling/discussion 02/18/2017  . Uterine cancer ([place]) 02/07/2017    Past Surgical History:  Procedure Laterality Date  . CESAREAN SECTION  1986  . CHOLECYSTECTOMY    . HERNIA REPAIR    . IR GENERIC HISTORICAL  02/23/2017   IR NEPHROSTOMY PLACEMENT RIGHT 02/23/2017 [name], MD [facility]-INTERV RAD  . IR NEPHROSTOMY EXCHANGE RIGHT  04/18/2017    Prior to Admission medications   Medication Sig Start Date End Date Taking? Authorizing Provider  ALPRAZolam Duanne Moron) 0.25 MG tablet Take 1-2 tabs daily as needed Patient taking differently: Take 0.125-0.25 mg by mouth 2 (two) times daily as needed.  02/22/17   Lloyd Huger, MD  amLODipine (NORVASC) 2.5 MG tablet Take 1 tablet (2.5 mg total) by mouth daily. 04/02/17   Hillary Bow, MD  diphenoxylate-atropine (LOMOTIL) 2.5-0.025  MG tablet Take 1 tablet by mouth 4 (four) times daily as needed for diarrhea or loose stools. 03/27/17   Lloyd Huger, MD  ondansetron (ZOFRAN ODT) 4 MG disintegrating tablet Take 1 tablet (4 mg total) by mouth every 8 (eight) hours as needed for nausea or vomiting. 03/31/17   Earleen Newport, MD  ondansetron (ZOFRAN) 8 MG tablet Take 1 tablet (8 mg total) by mouth 2 (two) times daily as needed. Patient taking differently: Take 8 mg by mouth 2 (two) times daily as needed for nausea or vomiting.  02/14/17   Lloyd Huger, MD  promethazine (PHENERGAN) 25 MG suppository Place 1 suppository (25 mg total) rectally every 6 (six) hours as needed for nausea. 03/31/17 03/31/18  Earleen Newport, MD    No Known Allergies  Family History  Problem Relation Age of Onset  . Cancer Maternal Uncle   . Cancer Paternal Aunt   . Kidney cancer Neg Hx   . Bladder Cancer Neg Hx     Social History Social History  Substance Use Topics  . Smoking status: Former Smoker    Types: Cigarettes    Quit date: 43  . Smokeless tobacco: Never Used  . Alcohol use No    Review of Systems Constitutional: Negative for fever. Cardiovascular: Negative for chest pain. Respiratory: Negative for shortness of breath. Gastrointestinal: Negative for abdominal pain, vomiting and diarrhea. Genitourinary: Negative for dysuria. Musculoskeletal: Negative for back pain All other ROS negative  ____________________________________________   PHYSICAL  EXAM:  VITAL SIGNS: ED Triage Vitals  Enc Vitals Group     BP 05/13/17 0752 124/89     Pulse Rate 05/13/17 0752 (!) 111     Resp 05/13/17 0752 18     Temp 05/13/17 0752 98 F (36.7 C)     Temp Source 05/13/17 0752 Oral     SpO2 05/13/17 0752 95 %     Weight 05/13/17 0752 260 lb (117.9 kg)     Height 05/13/17 0752 5\' 5"  (1.651 m)     Head Circumference --      Peak Flow --      Pain Score 05/13/17 0751 0     Pain Loc --      Pain Edu? --      Excl. in  [place]? --     Constitutional: Alert and oriented. Well appearing and in no distress. Eyes: Normal exam ENT   Head: Normocephalic and atraumatic   Mouth/Throat: Mucous membranes are moist. Cardiovascular: Normal rate, regular rhythm. No murmur Respiratory: Normal respiratory effort without tachypnea nor retractions. Breath sounds are clear Gastrointestinal: Soft and nontender. No distention. Well appearing/nontender nephrostomy insertion site on the right side. Musculoskeletal: Nontender with normal range of motion in all extremities.  Neurologic:  Normal speech and language. No gross focal neurologic deficits Skin:  Skin is warm, dry and intact.  Psychiatric: Mood and affect are normal.   ____________________________________________    INITIAL IMPRESSION / ASSESSMENT AND PLAN / ED COURSE  Pertinent labs & imaging results that were available during my care of the patient were reviewed by me and considered in my medical decision making (see chart for details).  Patient presents to the emergency department with urine leaking around the urine leg bag. The urine is leaking at the site where the tube inserts into the leg bag. We are attempting to obtain a replacement leg bag compatible with the patient's nephrostomy tube. There is no leakage at the nephrostomy insertion. No issues with drainage. Overall the patient appears well no other complaints. Attempting to obtain a replacement leg bag for the patient.  The nurse was able to obtain an appropriate leg bag and have swapped out the bag and tubing up to that or from ostomy tube. Continues to have good drainage from the nephrostomy tube with no leakage. Patient will be discharged.  ____________________________________________   FINAL CLINICAL IMPRESSION(S) / ED DIAGNOSES  Leaking urine leg bag.    Harvest Dark, MD 05/13/17 [phone]

## 2017-05-13 NOTE — ED Notes (Signed)
Pt here for leak of tubing from leg bag tubing hooked to nephrostomy tube.  Small leak noted where bag connects to tubing.  Pt has no other complaints.

## 2017-05-13 NOTE — ED Triage Notes (Signed)
Pt reports leaking catheter during the night. States she has a long term foley in place due to cervical cancer. Has had long term catheter use for 4 months.

## 2017-05-14 NOTE — Progress Notes (Signed)
[place]  Telephone:([phone] [phone]  ID: Patricia Rojas OB: 12-23-1965  MR#: [id number]  HCW#:[id number]  Patient Care Team: Patient, No Pcp Per as PCP - General (General Practice) Clent Jacks, RN as Registered Nurse  CHIEF COMPLAINT: Squamous cell carcinoma of the cervix.  INTERVAL HISTORY: Patient returns to clinic today for further evaluation and treatment follow-up. She recently completed brachytherapy at [facility]. She continues to have pain, but is well-controlled on her current narcotic regimen. She otherwise feels well. She has no neurologic complaints. She denies any recent fevers or illnesses. She has a fair appetite. She has no chest pain or shortness of breath. She denies any constipation or diarrhea. She has no urinary complaints. Patient offers no further specific complaints today.  REVIEW OF SYSTEMS:   Review of Systems  Constitutional: Negative.  Negative for fever, malaise/fatigue and weight loss.  Respiratory: Negative.  Negative for cough and shortness of breath.   Cardiovascular: Negative.  Negative for chest pain and leg swelling.  Gastrointestinal: Negative for abdominal pain, constipation, diarrhea and nausea.  Genitourinary: Negative.   Musculoskeletal: Negative.   Neurological: Negative.  Negative for weakness.  Psychiatric/Behavioral: The patient is nervous/anxious.     As per HPI. Otherwise, a complete review of systems is negative.  PAST MEDICAL HISTORY: Past Medical History:  Diagnosis Date  . Cancer (HCC)    cervical  . Constipation   . Hemorrhoids   . N&V (nausea and vomiting)   . PONV (postoperative nausea and vomiting)     PAST SURGICAL HISTORY: Past Surgical History:  Procedure Laterality Date  . CESAREAN SECTION  1986  . CHOLECYSTECTOMY    . HERNIA REPAIR    . IR GENERIC HISTORICAL  02/23/2017   IR NEPHROSTOMY PLACEMENT RIGHT 02/23/2017 [name], MD [facility]-INTERV RAD  . IR NEPHROSTOMY  EXCHANGE RIGHT  04/18/2017    FAMILY HISTORY: Family History  Problem Relation Age of Onset  . Cancer Maternal Uncle   . Cancer Paternal Aunt   . Kidney cancer Neg Hx   . Bladder Cancer Neg Hx     ADVANCED DIRECTIVES (Y/N):  N  HEALTH MAINTENANCE: Social History  Substance Use Topics  . Smoking status: Former Smoker    Types: Cigarettes    Quit date: 76  . Smokeless tobacco: Never Used  . Alcohol use No     Colonoscopy:  PAP:  Bone density:  Lipid panel:  No Known Allergies  Current Outpatient Prescriptions  Medication Sig Dispense Refill  . ALPRAZolam (XANAX) 0.25 MG tablet Take 1-2 tabs daily as needed (Patient taking differently: Take 0.125-0.25 mg by mouth 2 (two) times daily as needed. ) 60 tablet 0  . amLODipine (NORVASC) 2.5 MG tablet Take 1 tablet (2.5 mg total) by mouth daily. 30 tablet 0  . diphenoxylate-atropine (LOMOTIL) 2.5-0.025 MG tablet Take 1 tablet by mouth 4 (four) times daily as needed for diarrhea or loose stools. 30 tablet 0  . ondansetron (ZOFRAN ODT) 4 MG disintegrating tablet Take 1 tablet (4 mg total) by mouth every 8 (eight) hours as needed for nausea or vomiting. 20 tablet 0  . ondansetron (ZOFRAN) 8 MG tablet Take 1 tablet (8 mg total) by mouth 2 (two) times daily as needed. (Patient taking differently: Take 8 mg by mouth 2 (two) times daily as needed for nausea or vomiting. ) 60 tablet 2  . promethazine (PHENERGAN) 25 MG suppository Place 1 suppository (25 mg total) rectally every 6 (six) hours as needed  for nausea. 12 suppository 1  . Oxycodone HCl 10 MG TABS Take 1 tablet (10 mg total) by mouth at bedtime. 30 tablet 0   No current facility-administered medications for this visit.     OBJECTIVE: Vitals:   05/15/17 1444  BP: (!) 147/97  Pulse: (!) 109  Resp: 20  Temp: 98.3 F (36.8 C)     Body mass index is 43.07 kg/m.    ECOG FS:1 - Symptomatic but completely ambulatory  General: Well-developed, well-nourished, no acute  distress. Eyes: Pink conjunctiva, anicteric sclera. Lungs: Clear to auscultation bilaterally. Heart: Regular rate and rhythm. No rubs, murmurs, or gallops. Abdomen: Soft, nontender, nondistended. No organomegaly noted, normoactive bowel sounds. GU: Exam recently performed at [facility]. Musculoskeletal: No edema, cyanosis, or clubbing. Neuro: Alert, answering all questions appropriately. Cranial nerves grossly intact. Skin: No rashes or petechiae noted. Psych: Normal affect.   LAB RESULTS:  Lab Results  Component Value Date   NA 135 05/15/2017   K 3.8 05/15/2017   CL 106 05/15/2017   CO2 23 05/15/2017   GLUCOSE 96 05/15/2017   BUN 21 (H) 05/15/2017   CREATININE 1.48 (H) 05/15/2017   CALCIUM 9.4 05/15/2017   PROT 7.0 03/31/2017   ALBUMIN 3.4 (L) 03/31/2017   AST 20 03/31/2017   ALT 14 03/31/2017   ALKPHOS 88 03/31/2017   BILITOT 0.4 03/31/2017   GFRNONAA 40 (L) 05/15/2017   GFRAA 46 (L) 05/15/2017    Lab Results  Component Value Date   WBC 6.4 05/15/2017   NEUTROABS 4.8 05/15/2017   HGB 8.6 (L) 05/15/2017   HCT 24.5 (L) 05/15/2017   MCV 82.7 05/15/2017   PLT 439 05/15/2017     STUDIES: No results found.  ASSESSMENT: Squamous cell carcinoma of the cervix.  PLAN:    1. Squamous cell carcinoma of the cervix: PET scan results as well as pathology results reviewed independently confirming squamous cell carcinoma of the cervix. Patient also appears to have a nodal metastasis at the left internal iliac station. She has no other obvious metastatic disease. Patient completed concurrent chemotherapy and XRT in April 2018. Brachytherapy at [facility] has also been completed. Patient has imaging scheduled at [facility] in August 2018. Return to clinic on August 29, 2017 for evaluation. Patient will also have evaluation by radiation oncology and gynecology oncology on the same day.   2. Pain: Improving. Patient has discontinued her fentanyl patch. Continue  oxycodone at night as needed. Patient was given a prescription today.  3. Nausea: Resolved. 4. Constipation: Continue MiraLAX as needed. 5. Anxiety: Continue Xanax as needed. 6. Renal insufficiency: Right nephrostomy tube in place.  Replace as needed per nephrology.  Patient expressed understanding and was in agreement with this plan. She also understands that She can call clinic at any time with any questions, concerns, or complaints.   Cancer Staging Uterine cancer [facility]) Staging form: Corpus Uteri - Carcinoma and Carcinosarcoma, AJCC 8th Edition - Clinical stage from 02/18/2017: FIGO Stage IIIC1 (cT3b, cN1, cM0) - Signed by Lloyd Huger, MD on 02/18/2017   Lloyd Huger, MD   05/20/2017 8:09 PM

## 2017-05-15 ENCOUNTER — Inpatient Hospital Stay: Payer: Medicaid Other | Attending: Oncology | Admitting: Oncology

## 2017-05-15 ENCOUNTER — Inpatient Hospital Stay: Payer: Medicaid Other

## 2017-05-15 VITALS — BP 147/97 | HR 109 | Temp 98.3°F | Resp 20 | Wt 258.8 lb

## 2017-05-15 DIAGNOSIS — C538 Malignant neoplasm of overlapping sites of cervix uteri: Secondary | ICD-10-CM

## 2017-05-15 DIAGNOSIS — Z87891 Personal history of nicotine dependence: Secondary | ICD-10-CM | POA: Insufficient documentation

## 2017-05-15 DIAGNOSIS — N289 Disorder of kidney and ureter, unspecified: Secondary | ICD-10-CM | POA: Diagnosis not present

## 2017-05-15 DIAGNOSIS — Z79899 Other long term (current) drug therapy: Secondary | ICD-10-CM | POA: Diagnosis not present

## 2017-05-15 DIAGNOSIS — K5900 Constipation, unspecified: Secondary | ICD-10-CM | POA: Insufficient documentation

## 2017-05-15 DIAGNOSIS — F419 Anxiety disorder, unspecified: Secondary | ICD-10-CM | POA: Insufficient documentation

## 2017-05-15 DIAGNOSIS — C55 Malignant neoplasm of uterus, part unspecified: Secondary | ICD-10-CM

## 2017-05-15 LAB — CBC WITH DIFFERENTIAL/PLATELET
BASOS ABS: 0 10*3/uL (ref 0–0.1)
BASOS PCT: 1 %
EOS ABS: 0.3 10*3/uL (ref 0–0.7)
Eosinophils Relative: 5 %
HEMATOCRIT: 24.5 % — AB (ref 35.0–47.0)
HEMOGLOBIN: 8.6 g/dL — AB (ref 12.0–16.0)
Lymphocytes Relative: 12 %
Lymphs Abs: 0.8 10*3/uL — ABNORMAL LOW (ref 1.0–3.6)
MCH: 28.9 pg (ref 26.0–34.0)
MCHC: 34.9 g/dL (ref 32.0–36.0)
MCV: 82.7 fL (ref 80.0–100.0)
Monocytes Absolute: 0.5 10*3/uL (ref 0.2–0.9)
Monocytes Relative: 8 %
NEUTROS ABS: 4.8 10*3/uL (ref 1.4–6.5)
NEUTROS PCT: 74 %
Platelets: 439 10*3/uL (ref 150–440)
RBC: 2.96 MIL/uL — ABNORMAL LOW (ref 3.80–5.20)
RDW: 21.5 % — ABNORMAL HIGH (ref 11.5–14.5)
WBC: 6.4 10*3/uL (ref 3.6–11.0)

## 2017-05-15 LAB — BASIC METABOLIC PANEL
Anion gap: 6 (ref 5–15)
BUN: 21 mg/dL — AB (ref 6–20)
CALCIUM: 9.4 mg/dL (ref 8.9–10.3)
CO2: 23 mmol/L (ref 22–32)
CREATININE: 1.48 mg/dL — AB (ref 0.44–1.00)
Chloride: 106 mmol/L (ref 101–111)
GFR calc Af Amer: 46 mL/min — ABNORMAL LOW (ref >60)
GFR calc non Af Amer: 40 mL/min — ABNORMAL LOW (ref >60)
GLUCOSE: 96 mg/dL (ref 65–99)
Potassium: 3.8 mmol/L (ref 3.5–5.1)
Sodium: 135 mmol/L (ref 135–145)

## 2017-05-15 LAB — MAGNESIUM: Magnesium: 1.8 mg/dL (ref 1.7–2.4)

## 2017-05-15 MED ORDER — OXYCODONE HCL 10 MG PO TABS: 10.0000 mg | ORAL_TABLET | Freq: Every day | ORAL | 0 refills | Status: DC

## 2017-05-15 NOTE — Progress Notes (Signed)
Patient denies any concerns today.  

## 2017-05-25 ENCOUNTER — Ambulatory Visit (INDEPENDENT_AMBULATORY_CARE_PROVIDER_SITE_OTHER): Payer: Self-pay | Admitting: Urology

## 2017-05-25 ENCOUNTER — Encounter: Payer: Self-pay | Admitting: Urology

## 2017-05-25 VITALS — BP 114/81 | HR 109 | Ht 65.0 in | Wt 265.0 lb

## 2017-05-25 DIAGNOSIS — C539 Malignant neoplasm of cervix uteri, unspecified: Secondary | ICD-10-CM

## 2017-05-25 DIAGNOSIS — N179 Acute kidney failure, unspecified: Secondary | ICD-10-CM

## 2017-05-25 DIAGNOSIS — N1339 Other hydronephrosis: Secondary | ICD-10-CM

## 2017-05-25 NOTE — Progress Notes (Signed)
05/25/2017 10:17 AM   Patricia Rojas [birth year] [id number]  Referring provider: No referring provider defined for this encounter.  No chief complaint on file.   HPI: 51 year old female with right hydronephrosis secondary to stage IIIB cervical cancer managed with right PCN.   She is managed Dr. Fransisca Connors, [name], and Dr. Baruch Gouty receiving ciplatin, radiation, and brachytherapy at [facility]. She is due for repeat cross-sectional imaging in August.  Today, she is very anxious to have her tube removed. She endorses pain at the nephrostomy tube site and irritation.  Her creatinine continues to rise slightly, most recently 1.48 on 6/518 up from her baseline of what appears to be 0.68 in  2017.  No new cross-sectional imaging since PET scan on 02/15/2017.  Right nephrostomy tube placed on 02/23/2017.  Her last nephrostomy tube exchange was on 05/19/2017.  PMH: Past Medical History:  Diagnosis Date  . Cancer (HCC)    cervical  . Constipation   . Hemorrhoids   . N&V (nausea and vomiting)   . PONV (postoperative nausea and vomiting)     Surgical History: Past Surgical History:  Procedure Laterality Date  . CESAREAN SECTION  1986  . CHOLECYSTECTOMY    . HERNIA REPAIR    . IR GENERIC HISTORICAL  02/23/2017   IR NEPHROSTOMY PLACEMENT RIGHT 02/23/2017 [name], MD [facility]INTERV RAD  . IR NEPHROSTOMY EXCHANGE RIGHT  04/18/2017    Home Medications:  Allergies as of 05/25/2017   No Known Allergies     Medication List       Accurate as of 05/25/17 10:17 AM. Always use your most recent med list.          ALPRAZolam 0.25 MG tablet Commonly known as:  XANAX Take 1-2 tabs daily as needed   amLODipine 2.5 MG tablet Commonly known as:  NORVASC Take 1 tablet (2.5 mg total) by mouth daily.       Allergies: No Known Allergies  Family History: Family History  Problem Relation Age of Onset  . Cancer Maternal Uncle   . Cancer Paternal Aunt   . Kidney cancer Neg Hx   .  Bladder Cancer Neg Hx     Social History:  reports that she quit smoking about 28 years ago. Her smoking use included Cigarettes. She has never used smokeless tobacco. She reports that she does not drink alcohol or use drugs.  ROS: UROLOGY Frequent Urination?: No Hard to postpone urination?: No Burning/pain with urination?: No Get up at night to urinate?: No Leakage of urine?: No Urine stream starts and stops?: No Trouble starting stream?: No Do you have to strain to urinate?: No Blood in urine?: No Urinary tract infection?: No Sexually transmitted disease?: No Injury to kidneys or bladder?: No Painful intercourse?: No Weak stream?: No Currently pregnant?: No Vaginal bleeding?: No Last menstrual period?: n  Gastrointestinal Nausea?: No Vomiting?: No Indigestion/heartburn?: No Diarrhea?: Yes Constipation?: Yes  Constitutional Fever: No Night sweats?: No Weight loss?: No Fatigue?: No  Skin Skin rash/lesions?: No Itching?: No  Eyes Blurred vision?: No Double vision?: No  Ears/Nose/Throat Sore throat?: No Sinus problems?: No  Hematologic/Lymphatic Swollen glands?: No Easy bruising?: No  Cardiovascular Leg swelling?: No Chest pain?: No  Respiratory Cough?: No Shortness of breath?: No  Endocrine Excessive thirst?: No  Musculoskeletal Back pain?: Yes Joint pain?: No  Neurological Headaches?: No Dizziness?: No  Psychologic Depression?: No Anxiety?: No  Physical Exam: BP 114/81   Pulse (!) 109   Ht 5\' 5"  (1.651 m)  Wt 265 lb (120.2 kg)   BMI 44.10 kg/m   Constitutional:  Alert and oriented, No acute distress.  Tearful  again today HEENT: [place] AT, moist mucus membranes.  Trachea midline, no masses. Cardiovascular: No clubbing, cyanosis, or edema. Respiratory: Normal respiratory effort, no increased work of breathing. GI: Abdomen is soft, nontender, nondistended, no abdominal masses.  Morbidly obese. GU: Right nephrostomy tube in place  draining clear yellow urine.  Back was removed today in tube was capped. Skin: No rashes, bruises or suspicious lesions. Neurologic: Grossly intact, no focal deficits, moving all 4 extremities. Psychiatric: Normal mood and affect.  Laboratory Data: Lab Results  Component Value Date   WBC 6.4 05/15/2017   HGB 8.6 (L) 05/15/2017   HCT 24.5 (L) 05/15/2017   MCV 82.7 05/15/2017   PLT 439 05/15/2017    Lab Results  Component Value Date   CREATININE 1.48 (H) 05/15/2017    Urinalysis Results for orders placed or performed in visit on 05/15/17  CBC with Differential  Result Value Ref Range   WBC 6.4 3.6 - 11.0 K/uL   RBC 2.96 (L) 3.80 - 5.20 MIL/uL   Hemoglobin 8.6 (L) 12.0 - 16.0 g/dL   HCT 24.5 (L) 35.0 - 47.0 %   MCV 82.7 80.0 - 100.0 fL   MCH 28.9 26.0 - 34.0 pg   MCHC 34.9 32.0 - 36.0 g/dL   RDW 21.5 (H) 11.5 - 14.5 %   Platelets 439 150 - 440 K/uL   Neutrophils Relative % 74 %   Neutro Abs 4.8 1.4 - 6.5 K/uL   Lymphocytes Relative 12 %   Lymphs Abs 0.8 (L) 1.0 - 3.6 K/uL   Monocytes Relative 8 %   Monocytes Absolute 0.5 0.2 - 0.9 K/uL   Eosinophils Relative 5 %   Eosinophils Absolute 0.3 0 - 0.7 K/uL   Basophils Relative 1 %   Basophils Absolute 0.0 0 - 0.1 K/uL  Basic metabolic panel  Result Value Ref Range   Sodium 135 135 - 145 mmol/L   Potassium 3.8 3.5 - 5.1 mmol/L   Chloride 106 101 - 111 mmol/L   CO2 23 22 - 32 mmol/L   Glucose, Bld 96 65 - 99 mg/dL   BUN 21 (H) 6 - 20 mg/dL   Creatinine, Ser 1.48 (H) 0.44 - 1.00 mg/dL   Calcium 9.4 8.9 - 10.3 mg/dL   GFR calc non Af Amer 40 (L) >60 mL/min   GFR calc Af Amer 46 (L) >60 mL/min   Anion gap 6 5 - 15  Magnesium  Result Value Ref Range   Magnesium 1.8 1.7 - 2.4 mg/dL    Pertinent Imaging: No new interval imaging  Assessment & Plan:    1. Hydronephrosis of right kidney Moderate to severe right hydroureteronephrosis down to the level of the pelvis secondary to obstructing  Managed with right  nephrostomy tube Now status post chemotherapy, external beam radiation as well as brachytherapy With lack of new imaging comments difficult to assess her response to treatment. We discussed options today including waiting until follow-up imaging in August versus clamp trial on antegrade nephrostogram to assess right renal drainage. She is more interested in this. Tube was capped today, will arrange for antegrade nephrostogram to assess degree of hydronephrosis and right renal drainage. If kidney draining well on antegrade, will remove nephrostomy tube. If sluggish or fails to drain, will arrange for nephrostomy tube exchange at that time.  She was advised today that if she develops  fevers, chills, flank pain, any other concerning symptoms, she should open up her nephrostomy tube to drainage. She was given supplies in teaching on how to do this.  2. Acute renal failure, unspecified acute renal failure type ([place]) Initially thought to be secondary to obstruction Minimal improvement with tube ? Nephrotoxic chemo  3. Cervical cancer, FIGO stage IIIB ([place]) Plan as above Awaiting repeat imaging  We'll arrange for follow up based on antegrade results.  Hollice Espy, MD  [facility] [address] [place], [place] [address] [phone]

## 2017-05-28 ENCOUNTER — Other Ambulatory Visit: Payer: Self-pay | Admitting: Radiology

## 2017-05-28 DIAGNOSIS — N1339 Other hydronephrosis: Secondary | ICD-10-CM

## 2017-05-31 ENCOUNTER — Telehealth: Payer: Self-pay | Admitting: Radiology

## 2017-05-31 ENCOUNTER — Other Ambulatory Visit: Payer: Self-pay | Admitting: Radiology

## 2017-05-31 DIAGNOSIS — N1330 Unspecified hydronephrosis: Secondary | ICD-10-CM

## 2017-05-31 NOTE — Telephone Encounter (Signed)
Notified pt of nephrostogram with possible nephrostomy tube exchange scheduled 06/08/17 @8 :00. Advised pt to be npo after mn & have a driver present for procedure. Pt voices understanding & requests sedation for procedure d/t experience with last nephrostomy tube exchange. Barbara in IR scheduling was notified of request.

## 2017-05-31 NOTE — Telephone Encounter (Signed)
-----   Message from Hollice Espy, MD sent at 05/25/2017 10:19 AM EDT ----- Cherryl Babin, I have ordered an IR procedure

## 2017-06-08 ENCOUNTER — Ambulatory Visit: Admission: RE | Admit: 2017-06-08 | Payer: Self-pay | Source: Ambulatory Visit

## 2017-06-08 ENCOUNTER — Ambulatory Visit
Admission: RE | Admit: 2017-06-08 | Discharge: 2017-06-08 | Disposition: A | Discharge status: Home / Self Care | Payer: Medicaid Other | Source: Ambulatory Visit | Attending: Urology | Admitting: Urology

## 2017-06-08 DIAGNOSIS — Z87891 Personal history of nicotine dependence: Secondary | ICD-10-CM | POA: Insufficient documentation

## 2017-06-08 DIAGNOSIS — K5900 Constipation, unspecified: Secondary | ICD-10-CM | POA: Insufficient documentation

## 2017-06-08 DIAGNOSIS — C539 Malignant neoplasm of cervix uteri, unspecified: Secondary | ICD-10-CM | POA: Insufficient documentation

## 2017-06-08 DIAGNOSIS — N135 Crossing vessel and stricture of ureter without hydronephrosis: Secondary | ICD-10-CM | POA: Diagnosis not present

## 2017-06-08 DIAGNOSIS — Z436 Encounter for attention to other artificial openings of urinary tract: Secondary | ICD-10-CM | POA: Insufficient documentation

## 2017-06-08 DIAGNOSIS — N1339 Other hydronephrosis: Secondary | ICD-10-CM

## 2017-06-08 HISTORY — PX: IR NEPHROSTOMY EXCHANGE RIGHT: IMG6070

## 2017-06-08 LAB — PREGNANCY, URINE: PREG TEST UR: NEGATIVE

## 2017-06-08 MED ORDER — FENTANYL CITRATE (PF) 100 MCG/2ML IJ SOLN
INTRAMUSCULAR | Status: AC | PRN
  Administered 2017-06-08: 50 ug via INTRAVENOUS
  Administered 2017-06-08: 25 ug via INTRAVENOUS

## 2017-06-08 MED ORDER — MIDAZOLAM HCL 5 MG/5ML IJ SOLN
INTRAMUSCULAR | Status: AC
  Filled 2017-06-08: qty 5

## 2017-06-08 MED ORDER — SODIUM CHLORIDE 0.9 % IV SOLN
INTRAVENOUS | Status: DC
  Administered 2017-06-08: 08:00:00 via INTRAVENOUS

## 2017-06-08 MED ORDER — ONDANSETRON HCL 4 MG/2ML IJ SOLN
4.0000 mg | Freq: Once | INTRAMUSCULAR | Status: AC
  Administered 2017-06-08: 4 mg via INTRAVENOUS
  Filled 2017-06-08: qty 2

## 2017-06-08 MED ORDER — ONDANSETRON HCL 4 MG/2ML IJ SOLN
INTRAMUSCULAR | Status: AC
  Filled 2017-06-08: qty 2

## 2017-06-08 MED ORDER — SODIUM CHLORIDE 0.9 % IV SOLN: INTRAVENOUS | Status: DC

## 2017-06-08 MED ORDER — LIDOCAINE HCL (PF) 1 % IJ SOLN
INTRAMUSCULAR | Status: AC
  Filled 2017-06-08: qty 30

## 2017-06-08 MED ORDER — FENTANYL CITRATE (PF) 100 MCG/2ML IJ SOLN
INTRAMUSCULAR | Status: AC
  Filled 2017-06-08: qty 2

## 2017-06-08 MED ORDER — MIDAZOLAM HCL 5 MG/5ML IJ SOLN
INTRAMUSCULAR | Status: AC | PRN
  Administered 2017-06-08: 2 mg via INTRAVENOUS
  Administered 2017-06-08: 1 mg via INTRAVENOUS

## 2017-06-08 MED ORDER — IOPAMIDOL (ISOVUE-300) INJECTION 61%
30.0000 mL | Freq: Once | INTRAVENOUS | Status: AC | PRN
  Administered 2017-06-08: 09:00:00 10 mL

## 2017-06-08 NOTE — H&P (Signed)
Chief Complaint: Patient was seen in consultation today for No chief complaint on file.  at the request of [place]  Referring Physician(s): Hollice Espy  Supervising Physician: Marybelle Killings  Patient Status: [facility] - Out-pt  History of Present Illness: Patricia Rojas is a 50 y.o. female with cervical cancer and right ureteral obstruction. She has a right PCN in place. Clinically, she failed a capping trial. She is referred for antegrade nephrostogram and possible capping, if the right ureter is patent.  Past Medical History:  Diagnosis Date  . Cancer (HCC)    cervical  . Constipation   . Hemorrhoids   . N&V (nausea and vomiting)   . PONV (postoperative nausea and vomiting)     Past Surgical History:  Procedure Laterality Date  . CESAREAN SECTION  1986  . CHOLECYSTECTOMY    . HERNIA REPAIR    . IR GENERIC HISTORICAL  02/23/2017   IR NEPHROSTOMY PLACEMENT RIGHT 02/23/2017 [name], MD [facility]INTER[facility] RAD  . IR NEPHROSTOMY EXCHANGE RIGHT  04/18/2017    Allergies: Patient has no known allergies.  Medications: Prior to Admission medications   Medication Sig Start Date End Date Taking? Authorizing Provider  ALPRAZolam Duanne Moron) 0.25 MG tablet Take 1-2 tabs daily as needed Patient taking differently: Take 0.125-0.25 mg by mouth 2 (two) times daily as needed.  02/22/17  Yes Lloyd Huger, MD  amLODipine (NORVASC) 2.5 MG tablet Take 1 tablet (2.5 mg total) by mouth daily. 04/02/17  Yes Hillary Bow, MD     Family History  Problem Relation Age of Onset  . Cancer Maternal Uncle   . Cancer Paternal Aunt   . Kidney cancer Neg Hx   . Bladder Cancer Neg Hx     Social History   Social History  . Marital status: Single    Spouse name: N/A  . Number of children: N/A  . Years of education: N/A   Social History Main Topics  . Smoking status: Former Smoker    Types: Cigarettes    Quit date: 29  . Smokeless tobacco: Never Used  . Alcohol use No  . Drug use:  No     Comment: stopped 3 weeks ago  . Sexual activity: Not Asked   Other Topics Concern  . None   Social History Narrative  . None      Review of Systems: A 12 point ROS discussed and pertinent positives are indicated in the HPI above.  All other systems are negative.  Review of Systems  Vital Signs: BP 135/70 (BP Location: Left Leg)   Pulse 89   Temp 99.4 F (37.4 C) (Oral)   Resp 18   LMP 02/07/2017 Comment: pregnancy test  SpO2 97%   Physical Exam  Constitutional: She is oriented to person, place, and time. She appears well-developed and well-nourished.  Cardiovascular: Normal rate and regular rhythm.   Pulmonary/Chest: Effort normal and breath sounds normal.  Neurological: She is alert and oriented to person, place, and time.    Mallampati Score: 1    Imaging: No results found.  Labs:  CBC:  Recent Labs  03/31/17 2208 04/01/17 0425 04/02/17 0705 05/15/17 1420  WBC 3.6 2.6* 3.8 6.4  HGB 8.2* 7.1* 7.7* 8.6*  HCT 24.7* 21.1* 22.6* 24.5*  PLT 258 211 250 439    COAGS:  Recent Labs  02/23/17 0719  INR 0.99  APTT 25    BMP:  Recent Labs  03/31/17 2208 04/01/17 0425 04/02/17 0705 05/15/17 1420  NA  134* 136 133* 135  K 4.3 4.4 4.6 3.8  CL 102 107 107 106  CO2 24 23 20* 23  GLUCOSE 105* 85 149* 96  BUN 33* 29* 26* 21*  CALCIUM 8.8* 8.0* 8.7* 9.4  CREATININE 1.66* 1.65* 1.41* 1.48*  GFRNONAA 35* 35* 43* 40*  GFRAA 41* 41* 49* 46*    LIVER FUNCTION TESTS:  Recent Labs  02/04/17 2218 02/07/17 1530 03/12/17 0950 03/31/17 2208  BILITOT 0.3 0.5 0.5 0.4  AST 18 19 26 20   ALT 12* 12* 22 14  ALKPHOS 67 79 84 88  PROT 8.1 8.7* 7.7 7.0  ALBUMIN 3.5 3.8 3.6 3.4*    TUMOR MARKERS: No results for input(s): AFPTM, CEA, CA199, CHROMGRNA in the last 8760 hours.  Assessment and Plan:  Right ureteral obstruction. Antegrade nephrostogram ot be performed. If the ureter is obstructed, will exchange.  Thank you for this interesting  consult.  I greatly enjoyed meeting MING KUNKA and look forward to participating in their care.  A copy of this report was sent to the requesting provider on this date.  Electronically Signed: [place] Hafen, ART A, MD 06/08/2017, 8:20 AM   I spent a total of   25 Minutes in face to face in clinical consultation, greater than 50% of which was counseling/coordinating care for nephrostomy tube.

## 2017-06-08 NOTE — Procedures (Signed)
R PCN exchange R nephrostogram  Persistent R ureteral obstruction R PCN exchanged 10 Fr  EBL 0 Comp 0

## 2017-06-10 ENCOUNTER — Inpatient Hospital Stay
Admission: EM | Admit: 2017-06-10 | Discharge: 2017-06-12 | DRG: 698 | Disposition: A | Discharge status: Home / Self Care | Payer: Medicaid Other | Attending: Internal Medicine | Admitting: Internal Medicine

## 2017-06-10 ENCOUNTER — Encounter: Payer: Self-pay | Admitting: Emergency Medicine

## 2017-06-10 ENCOUNTER — Emergency Department: Payer: Medicaid Other

## 2017-06-10 DIAGNOSIS — Z923 Personal history of irradiation: Secondary | ICD-10-CM | POA: Diagnosis not present

## 2017-06-10 DIAGNOSIS — A419 Sepsis, unspecified organism: Secondary | ICD-10-CM | POA: Diagnosis present

## 2017-06-10 DIAGNOSIS — Z936 Other artificial openings of urinary tract status: Secondary | ICD-10-CM | POA: Diagnosis not present

## 2017-06-10 DIAGNOSIS — N1330 Unspecified hydronephrosis: Secondary | ICD-10-CM | POA: Diagnosis present

## 2017-06-10 DIAGNOSIS — N390 Urinary tract infection, site not specified: Secondary | ICD-10-CM | POA: Diagnosis present

## 2017-06-10 DIAGNOSIS — N183 Chronic kidney disease, stage 3 (moderate): Secondary | ICD-10-CM | POA: Diagnosis present

## 2017-06-10 DIAGNOSIS — Z79899 Other long term (current) drug therapy: Secondary | ICD-10-CM

## 2017-06-10 DIAGNOSIS — I959 Hypotension, unspecified: Secondary | ICD-10-CM | POA: Diagnosis present

## 2017-06-10 DIAGNOSIS — C539 Malignant neoplasm of cervix uteri, unspecified: Secondary | ICD-10-CM | POA: Diagnosis present

## 2017-06-10 DIAGNOSIS — N179 Acute kidney failure, unspecified: Secondary | ICD-10-CM | POA: Diagnosis present

## 2017-06-10 DIAGNOSIS — D638 Anemia in other chronic diseases classified elsewhere: Secondary | ICD-10-CM | POA: Diagnosis present

## 2017-06-10 DIAGNOSIS — Z9221 Personal history of antineoplastic chemotherapy: Secondary | ICD-10-CM

## 2017-06-10 DIAGNOSIS — R000 Tachycardia, unspecified: Secondary | ICD-10-CM | POA: Diagnosis present

## 2017-06-10 DIAGNOSIS — Z87891 Personal history of nicotine dependence: Secondary | ICD-10-CM | POA: Diagnosis not present

## 2017-06-10 DIAGNOSIS — N189 Chronic kidney disease, unspecified: Secondary | ICD-10-CM | POA: Diagnosis present

## 2017-06-10 DIAGNOSIS — T83512A Infection and inflammatory reaction due to nephrostomy catheter, initial encounter: Secondary | ICD-10-CM | POA: Diagnosis present

## 2017-06-10 LAB — PROCALCITONIN: Procalcitonin: 0.22 ng/mL

## 2017-06-10 LAB — URINALYSIS, COMPLETE (UACMP) WITH MICROSCOPIC
BILIRUBIN URINE: NEGATIVE
Bilirubin Urine: NEGATIVE
GLUCOSE, UA: NEGATIVE mg/dL
GLUCOSE, UA: NEGATIVE mg/dL
Ketones, ur: NEGATIVE mg/dL
Ketones, ur: NEGATIVE mg/dL
NITRITE: NEGATIVE
Nitrite: NEGATIVE
PH: 5 (ref 5.0–8.0)
PROTEIN: 100 mg/dL — AB
Protein, ur: 30 mg/dL — AB
SPECIFIC GRAVITY, URINE: 1.011 (ref 1.005–1.030)
Specific Gravity, Urine: 1.009 (ref 1.005–1.030)
pH: 5 (ref 5.0–8.0)

## 2017-06-10 LAB — COMPREHENSIVE METABOLIC PANEL
ALT: 6 U/L — AB (ref 14–54)
ANION GAP: 9 (ref 5–15)
AST: 15 U/L (ref 15–41)
Albumin: 3.1 g/dL — ABNORMAL LOW (ref 3.5–5.0)
Alkaline Phosphatase: 75 U/L (ref 38–126)
BUN: 18 mg/dL (ref 6–20)
CALCIUM: 9 mg/dL (ref 8.9–10.3)
CHLORIDE: 101 mmol/L (ref 101–111)
CO2: 24 mmol/L (ref 22–32)
CREATININE: 1.86 mg/dL — AB (ref 0.44–1.00)
GFR, EST AFRICAN AMERICAN: 35 mL/min — AB (ref >60)
GFR, EST NON AFRICAN AMERICAN: 30 mL/min — AB (ref >60)
Glucose, Bld: 107 mg/dL — ABNORMAL HIGH (ref 65–99)
Potassium: 3.7 mmol/L (ref 3.5–5.1)
SODIUM: 134 mmol/L — AB (ref 135–145)
Total Bilirubin: 0.6 mg/dL (ref 0.3–1.2)
Total Protein: 8.7 g/dL — ABNORMAL HIGH (ref 6.5–8.1)

## 2017-06-10 LAB — CBC WITH DIFFERENTIAL/PLATELET
BASOS ABS: 0.1 10*3/uL (ref 0–0.1)
Basophils Relative: 1 %
EOS ABS: 0.1 10*3/uL (ref 0–0.7)
EOS PCT: 1 %
HCT: 25.1 % — ABNORMAL LOW (ref 35.0–47.0)
HEMOGLOBIN: 8.5 g/dL — AB (ref 12.0–16.0)
LYMPHS ABS: 0.8 10*3/uL — AB (ref 1.0–3.6)
LYMPHS PCT: 7 %
MCH: 28.2 pg (ref 26.0–34.0)
MCHC: 33.9 g/dL (ref 32.0–36.0)
MCV: 83.3 fL (ref 80.0–100.0)
Monocytes Absolute: 0.9 10*3/uL (ref 0.2–0.9)
Monocytes Relative: 9 %
NEUTROS PCT: 82 %
Neutro Abs: 8.5 10*3/uL — ABNORMAL HIGH (ref 1.4–6.5)
PLATELETS: 461 10*3/uL — AB (ref 150–440)
RBC: 3.02 MIL/uL — AB (ref 3.80–5.20)
RDW: 17.2 % — ABNORMAL HIGH (ref 11.5–14.5)
WBC: 10.4 10*3/uL (ref 3.6–11.0)

## 2017-06-10 LAB — PROTIME-INR
INR: 1.16
PROTHROMBIN TIME: 14.9 s (ref 11.4–15.2)

## 2017-06-10 LAB — LACTIC ACID, PLASMA: LACTIC ACID, VENOUS: 1.1 mmol/L (ref 0.5–1.9)

## 2017-06-10 MED ORDER — SENNOSIDES-DOCUSATE SODIUM 8.6-50 MG PO TABS: 1.0000 | ORAL_TABLET | Freq: Every evening | ORAL | Status: DC | PRN

## 2017-06-10 MED ORDER — ALBUTEROL SULFATE (2.5 MG/3ML) 0.083% IN NEBU: 2.5000 mg | INHALATION_SOLUTION | RESPIRATORY_TRACT | Status: DC | PRN

## 2017-06-10 MED ORDER — ONDANSETRON 4 MG PO TBDP
ORAL_TABLET | ORAL | Status: AC
  Filled 2017-06-10: qty 1

## 2017-06-10 MED ORDER — SODIUM CHLORIDE 0.9 % IV BOLUS (SEPSIS)
1000.0000 mL | Freq: Once | INTRAVENOUS | Status: AC
  Administered 2017-06-10: 1000 mL via INTRAVENOUS

## 2017-06-10 MED ORDER — DEXTROSE 5 % IV SOLN
1.0000 g | INTRAVENOUS | Status: DC
  Administered 2017-06-11 – 2017-06-12 (×2): 1 g via INTRAVENOUS
  Filled 2017-06-10 (×3): qty 10

## 2017-06-10 MED ORDER — PROMETHAZINE HCL 25 MG/ML IJ SOLN
12.5000 mg | Freq: Once | INTRAMUSCULAR | Status: AC
  Administered 2017-06-10: 12.5 mg via INTRAVENOUS
  Filled 2017-06-10: qty 1

## 2017-06-10 MED ORDER — ACETAMINOPHEN 650 MG RE SUPP: 650.0000 mg | Freq: Four times a day (QID) | RECTAL | Status: DC | PRN

## 2017-06-10 MED ORDER — HEPARIN SODIUM (PORCINE) 5000 UNIT/ML IJ SOLN
5000.0000 [IU] | Freq: Three times a day (TID) | INTRAMUSCULAR | Status: DC
  Administered 2017-06-10 – 2017-06-11 (×2): 5000 [IU] via SUBCUTANEOUS
  Filled 2017-06-10 (×3): qty 1

## 2017-06-10 MED ORDER — ALPRAZOLAM 0.5 MG PO TABS: 0.2500 mg | ORAL_TABLET | Freq: Two times a day (BID) | ORAL | Status: DC | PRN

## 2017-06-10 MED ORDER — ONDANSETRON HCL 4 MG PO TABS
4.0000 mg | ORAL_TABLET | Freq: Four times a day (QID) | ORAL | Status: DC | PRN
  Administered 2017-06-10 – 2017-06-11 (×2): 4 mg via ORAL
  Filled 2017-06-10 (×2): qty 1

## 2017-06-10 MED ORDER — ONDANSETRON 4 MG PO TBDP
4.0000 mg | ORAL_TABLET | Freq: Once | ORAL | Status: AC
  Administered 2017-06-10: 4 mg via ORAL

## 2017-06-10 MED ORDER — DEXTROSE 5 % IV SOLN
1.0000 g | Freq: Once | INTRAVENOUS | Status: AC
  Administered 2017-06-10: 1 g via INTRAVENOUS
  Filled 2017-06-10: qty 10

## 2017-06-10 MED ORDER — ACETAMINOPHEN 325 MG PO TABS: 650.0000 mg | ORAL_TABLET | Freq: Four times a day (QID) | ORAL | Status: DC | PRN

## 2017-06-10 MED ORDER — ONDANSETRON HCL 4 MG/2ML IJ SOLN
4.0000 mg | Freq: Four times a day (QID) | INTRAMUSCULAR | Status: DC | PRN
  Administered 2017-06-11 – 2017-06-12 (×2): 4 mg via INTRAVENOUS
  Filled 2017-06-10 (×2): qty 2

## 2017-06-10 MED ORDER — SODIUM CHLORIDE 0.9 % IV SOLN
INTRAVENOUS | Status: DC
  Administered 2017-06-10 – 2017-06-12 (×4): via INTRAVENOUS

## 2017-06-10 MED ORDER — BISACODYL 5 MG PO TBEC: 5.0000 mg | DELAYED_RELEASE_TABLET | Freq: Every day | ORAL | Status: DC | PRN

## 2017-06-10 MED ORDER — IBUPROFEN 400 MG PO TABS
400.0000 mg | ORAL_TABLET | Freq: Once | ORAL | Status: AC
  Administered 2017-06-10: 400 mg via ORAL
  Filled 2017-06-10: qty 1

## 2017-06-10 MED ORDER — HYDROCODONE-ACETAMINOPHEN 5-325 MG PO TABS
1.0000 | ORAL_TABLET | ORAL | Status: DC | PRN
  Administered 2017-06-10 – 2017-06-12 (×6): 2 via ORAL
  Filled 2017-06-10 (×7): qty 2

## 2017-06-10 NOTE — ED Notes (Signed)
This Rn was getting ready to bring patient to floor when noticed bleeding at site of IV. Flushed Iv and redressed it, and bleeding started again. IV taken out. This RN attempted IV, unsucessful. 2nd RN attempted IV x2.

## 2017-06-10 NOTE — Plan of Care (Signed)
Problem: Physical Regulation: Goal: Ability to maintain clinical measurements within normal limits will improve Outcome: Progressing Pt admitted from the ED. Low grade fever. Rest of VSS. Pt reported Chronic lower abd pain and lower back pain, but declined an intervention. No c/o n/v. R nephrostomy dressing was loose. Site cleansed with NS and dressing changed.

## 2017-06-10 NOTE — ED Triage Notes (Signed)
States has been running a fever overnight.  States had renal stent and drainage bag-- bag changed on Friday.  Patient has history of cervical cancer and has finished chemo and internal radiation.

## 2017-06-10 NOTE — Progress Notes (Signed)
ANTIBIOTIC CONSULT NOTE - INITIAL  Pharmacy Consult for Ceftriaxone  Indication: UTI  No Known Allergies  Patient Measurements: Height: 5\' 5"  (165.1 cm) Weight: 257 lb 14.4 oz (117 kg) IBW/kg (Calculated) : 57 Adjusted Body Weight:   Vital Signs: Temp: 99.6 F (37.6 C) (07/01 1605) Temp Source: Oral (07/01 1605) BP: 113/74 (07/01 1605) Pulse Rate: 109 (07/01 1605) Intake/Output from previous day: No intake/output data recorded. Intake/Output from this shift: No intake/output data recorded.  Labs:  Recent Labs  06/10/17 0911  WBC 10.4  HGB 8.5*  PLT 461*  CREATININE 1.86*   Estimated Creatinine Clearance: 45.8 mL/min (A) (by C-G formula based on SCr of 1.86 mg/dL (H)). No results for input(s): VANCOTROUGH, VANCOPEAK, VANCORANDOM, GENTTROUGH, GENTPEAK, GENTRANDOM, TOBRATROUGH, TOBRAPEAK, TOBRARND, AMIKACINPEAK, AMIKACINTROU, AMIKACIN in the last 72 hours.   Microbiology: Recent Results (from the past 720 hour(s))  Urine culture     Status: None (Preliminary result)   Collection Time: 06/10/17 10:46 AM  Result Value Ref Range Status   Specimen Description   Final    KIDNEY Performed at [facility], [place] [address], [place], [place] [address]    Special Requests Normal  Final   Culture PENDING  Incomplete   Report Status PENDING  Incomplete    Medical History: Past Medical History:  Diagnosis Date  . Cancer (HCC)    cervical  . Constipation   . Hemorrhoids   . N&V (nausea and vomiting)   . PONV (postoperative nausea and vomiting)     Medications:  Scheduled:  . heparin  5,000 Units Subcutaneous Q8H  . ondansetron       Assessment: CrCl = 45.8 ml/min   Goal of Therapy:  resolution of infection  Plan:  Expected duration 7 days with resolution of temperature and/or normalization of WBC   Ceftriaxone 1 gm IV X 1 given on 7/1 @ 12:00. Ceftriaxone 1 gm IV Q24H ordered to start on 7/2 @ 10:00.   Patricia Rojas D 06/10/2017,7:17 PM

## 2017-06-10 NOTE — H&P (Signed)
Patricia Rojas at [place] NAME: Patricia Rojas    MR#:  [id number]  DATE OF BIRTH:  Aug 21, 1966  DATE OF ADMISSION:  06/10/2017  PRIMARY CARE PHYSICIAN: Patient, No Pcp Per   REQUESTING/REFERRING PHYSICIAN: Delman Kitten, MD  CHIEF COMPLAINT:   Chief Complaint  Patient presents with  . Fever   Fever, chills, nausea and vomiting today. HISTORY OF PRESENT ILLNESS:  Patricia Rojas  is a 51 y.o. female with a known history of Cervical cancer, status post the nephrostomy tube recently. She's had her gallbladder removed previously. The patient has had fever, chills, nausea and vomited once today. She has hypotension and tachycardia. She was found septic and UTI, treated with Rocephin in the ED.  PAST MEDICAL HISTORY:   Past Medical History:  Diagnosis Date  . Cancer (HCC)    cervical  . Constipation   . Hemorrhoids   . N&V (nausea and vomiting)   . PONV (postoperative nausea and vomiting)     PAST SURGICAL HISTORY:   Past Surgical History:  Procedure Laterality Date  . CESAREAN SECTION  1986  . CHOLECYSTECTOMY    . HERNIA REPAIR    . IR GENERIC HISTORICAL  02/23/2017   IR NEPHROSTOMY PLACEMENT RIGHT 02/23/2017 [name], MD [facility]INTERV RAD  . IR NEPHROSTOMY EXCHANGE RIGHT  04/18/2017  . IR NEPHROSTOMY EXCHANGE RIGHT  06/08/2017    SOCIAL HISTORY:   Social History  Substance Use Topics  . Smoking status: Former Smoker    Types: Cigarettes    Quit date: 64  . Smokeless tobacco: Never Used  . Alcohol use No    FAMILY HISTORY:   Family History  Problem Relation Age of Onset  . Cancer Maternal Uncle   . Cancer Paternal Aunt   . Kidney cancer Neg Hx   . Bladder Cancer Neg Hx     DRUG ALLERGIES:  No Known Allergies  REVIEW OF SYSTEMS:   Review of Systems  Constitutional: Positive for chills, fever and malaise/fatigue. Negative for weight loss.  HENT: Negative for sore throat.   Eyes: Negative for blurred vision and double  vision.  Respiratory: Negative for cough, shortness of breath and stridor.   Cardiovascular: Negative for chest pain and leg swelling.  Gastrointestinal: Positive for nausea and vomiting. Negative for abdominal pain, blood in stool, diarrhea and melena.  Genitourinary: Negative for dysuria, frequency and hematuria.  Musculoskeletal: Negative for back pain.  Skin: Negative for itching and rash.  Neurological: Positive for weakness. Negative for dizziness, focal weakness, loss of consciousness and headaches.  Psychiatric/Behavioral: Negative for depression. The patient is not nervous/anxious.     MEDICATIONS AT HOME:   Prior to Admission medications   Medication Sig Start Date End Date Taking? Authorizing Provider  prochlorperazine (COMPAZINE) 10 MG tablet Take 10 mg by mouth every 6 (six) hours as needed for nausea or vomiting.   Yes [provider]  ALPRAZolam Duanne Moron) 0.25 MG tablet Take 1-2 tabs daily as needed Patient taking differently: Take 0.125-0.25 mg by mouth 2 (two) times daily as needed.  02/22/17   Lloyd Huger, MD  amLODipine (NORVASC) 2.5 MG tablet Take 1 tablet (2.5 mg total) by mouth daily. 04/02/17   Hillary Bow, MD      VITAL SIGNS:  Blood pressure 102/81, pulse 99, temperature 98.5 F (36.9 C), temperature source Oral, resp. rate (!) 21, height 5\' 5"  (1.651 m), weight 265 lb (120.2 kg), last menstrual period 02/28/2017, SpO2 100 %.  PHYSICAL EXAMINATION:  Physical Exam  GENERAL:  51 y.o.-year-old patient lying in the bed with no acute distress. Obesity. EYES: Pupils equal, round, reactive to light and accommodation. No scleral icterus. Extraocular muscles intact.  HEENT: Head atraumatic, normocephalic. Oropharynx and nasopharynx clear.  NECK:  Supple, no jugular venous distention. No thyroid enlargement, no tenderness.  LUNGS: Normal breath sounds bilaterally, no wheezing, rales,rhonchi or crepitation. No use of accessory muscles of respiration.    CARDIOVASCULAR: S1, S2 normal. No murmurs, rubs, or gallops.  ABDOMEN: Soft, nontender, nondistended. Bowel sounds present. No organomegaly or mass. Nephrostomy tube in situ. EXTREMITIES: No pedal edema, cyanosis, or clubbing.  NEUROLOGIC: Cranial nerves II through XII are intact. Muscle strength 5/5 in all extremities. Sensation intact. Gait not checked.  PSYCHIATRIC: The patient is alert and oriented x 3.  SKIN: No obvious rash, lesion, or ulcer.   LABORATORY PANEL:   CBC  Recent Labs Lab 06/10/17 0911  WBC 10.4  HGB 8.5*  HCT 25.1*  PLT 461*   ------------------------------------------------------------------------------------------------------------------  Chemistries   Recent Labs Lab 06/10/17 0911  NA 134*  K 3.7  CL 101  CO2 24  GLUCOSE 107*  BUN 18  CREATININE 1.86*  CALCIUM 9.0  AST 15  ALT 6*  ALKPHOS 75  BILITOT 0.6   ------------------------------------------------------------------------------------------------------------------  Cardiac Enzymes No results for input(s): TROPONINI in the last 168 hours. ------------------------------------------------------------------------------------------------------------------  RADIOLOGY:  Dg Chest 2 View  Result Date: 06/10/2017 CLINICAL DATA:  Febrile. EXAM: CHEST  2 VIEW COMPARISON:  01/17/2016 FINDINGS: The cardiac silhouette, mediastinal and hilar contours are within normal limits and stable. The lungs are clear. No infiltrates or effusions. The bony thorax is intact. IMPRESSION: No acute cardiopulmonary findings. Electronically Signed   By: Marijo Sanes M.D.   On: 06/10/2017 09:49      IMPRESSION AND PLAN:   Sepsis due to UTI.  The patient will be admitted to medical floor. Continue Rocephin and follow-up CBC and cultures.  Acute renal failure on CKD. Continue IV fluid support and follow-up BMP.  History of hydronephrosis of right kidney. S/p right nephrostomy tube.  Anemia of chronic disease.  Stable. Cervical cancer.  status post chemotherapy, external beam radiation as well as brachytherapy  All the records are reviewed and case discussed with ED provider. Management plans discussed with the patient, family and they are in agreement.  CODE STATUS: Full code  TOTAL TIME TAKING CARE OF THIS PATIENT: 56 minutes.    Demetrios Loll M.D on 06/10/2017 at 1:38 PM  Between 7am to 6pm - Pager - [phone]  After 6pm go to [email or web address] - Proofreader  [facility]  Office  [phone]  CC: Primary care physician; Patient, No Pcp Per   Note: This dictation was prepared with Dragon dictation along with smaller phrase technology. Any transcriptional errors that result from this process are unintentional.

## 2017-06-10 NOTE — ED Provider Notes (Signed)
[facility] Emergency Department Provider Note   ____________________________________________   First MD Initiated Contact with Patient 06/10/17 1024     (approximate)  I have reviewed the triage vital signs and the nursing notes.   HISTORY  Chief Complaint Fever   HPI Patricia Rojas is a 51 y.o. female here for evaluation for fevers chills nausea and one episode of vomiting starting today.  Patient has cervical cancer and nephrostomy tube. Reports chills, low-grade fever at home. Has mild tenderness in the lower abdomen, but she reports she is always tender there and has been told this is due to her cancer. No new changes.  She's had her gallbladder removed previously. No chest pain or trouble breathing. No cough.  Has not taken any medications. No alleviating factors. No aggravating factors. Continues to feel mildly nauseated and fatigued today with chills. Took Zofran at home but did not help her symptoms  Past Medical History:  Diagnosis Date  . Cancer (HCC)    cervical  . Constipation   . Hemorrhoids   . N&V (nausea and vomiting)   . PONV (postoperative nausea and vomiting)     Patient Active Problem List   Diagnosis Date Noted  . Sepsis ([place]) 06/10/2017  . Intractable nausea and vomiting 04/01/2017  . Goals of care, counseling/discussion 02/18/2017  . Uterine cancer ([place]) 02/07/2017    Past Surgical History:  Procedure Laterality Date  . CESAREAN SECTION  1986  . CHOLECYSTECTOMY    . HERNIA REPAIR    . IR GENERIC HISTORICAL  02/23/2017   IR NEPHROSTOMY PLACEMENT RIGHT 02/23/2017 [name], MD [facility]-INTERV RAD  . IR NEPHROSTOMY EXCHANGE RIGHT  04/18/2017  . IR NEPHROSTOMY EXCHANGE RIGHT  06/08/2017    Prior to Admission medications   Medication Sig Start Date End Date Taking? Authorizing Provider  prochlorperazine (COMPAZINE) 10 MG tablet Take 10 mg by mouth every 6 (six) hours as needed for nausea or vomiting.   Yes [provider]  ALPRAZolam Duanne Moron) 0.25 MG tablet Take 1-2 tabs daily as needed Patient taking differently: Take 0.125-0.25 mg by mouth 2 (two) times daily as needed.  02/22/17   Lloyd Huger, MD  amLODipine (NORVASC) 2.5 MG tablet Take 1 tablet (2.5 mg total) by mouth daily. 04/02/17   Hillary Bow, MD    Allergies Patient has no known allergies.  Family History  Problem Relation Age of Onset  . Cancer Maternal Uncle   . Cancer Paternal Aunt   . Kidney cancer Neg Hx   . Bladder Cancer Neg Hx     Social History Social History  Substance Use Topics  . Smoking status: Former Smoker    Types: Cigarettes    Quit date: 48  . Smokeless tobacco: Never Used  . Alcohol use No    Review of Systems Constitutional: Fevers and chills Eyes: No visual changes. ENT: No sore throat. Cardiovascular: Denies chest pain. Respiratory: Denies shortness of breath. Gastrointestinal: No abdominal pain.   No diarrhea.  No constipation. Genitourinary: Negative for dysuria. Musculoskeletal: Negative for back pain. Skin: Negative for rash. Neurological: Negative for headaches, focal weakness or numbness.    ____________________________________________   PHYSICAL EXAM:  VITAL SIGNS: ED Triage Vitals  Enc Vitals Group     BP 06/10/17 0907 (!) 104/57     Pulse Rate 06/10/17 0907 (!) 105     Resp 06/10/17 0907 16     Temp 06/10/17 0907 98.5 F (36.9 C)  Temp Source 06/10/17 0907 Oral     SpO2 06/10/17 0907 97 %     Weight 06/10/17 0903 265 lb (120.2 kg)     Height 06/10/17 0903 '5\' 5"'$  (1.651 m)     Head Circumference --      Peak Flow --      Pain Score 06/10/17 0903 7     Pain Loc --      Pain Edu? --      Excl. in [place]? --     Constitutional: Alert and oriented. Well appearing and in no acute distress. Eyes: Conjunctivae are normal. Head: Atraumatic. Nose: No congestion/rhinnorhea. Mouth/Throat: Mucous membranes are moist. Neck: No stridor.   Cardiovascular: Normal  rate, regular rhythm. Grossly normal heart sounds.  Good peripheral circulation. Respiratory: Normal respiratory effort.  No retractions. Lungs CTAB. Gastrointestinal: Soft and nontender except for moderate right-sided CVA tenderness. No distention. Nephrostomy tube site is clean dry and intact draining clear to slightly hazy urine Musculoskeletal: No lower extremity tenderness nor edema. Neurologic:  Normal speech and language. No gross focal neurologic deficits are appreciated.  Skin:  Skin is warm, dry and intact. No rash noted. Psychiatric: Mood and affect are normal. Speech and behavior are normal.  ____________________________________________   LABS (all labs ordered are listed, but only abnormal results are displayed)  Labs Reviewed  COMPREHENSIVE METABOLIC PANEL - Abnormal; Notable for the following:       Result Value   Sodium 134 (*)    Glucose, Bld 107 (*)    Creatinine, Ser 1.86 (*)    Total Protein 8.7 (*)    Albumin 3.1 (*)    ALT 6 (*)    GFR calc non Af Amer 30 (*)    GFR calc Af Amer 35 (*)    All other components within normal limits  CBC WITH DIFFERENTIAL/PLATELET - Abnormal; Notable for the following:    RBC 3.02 (*)    Hemoglobin 8.5 (*)    HCT 25.1 (*)    RDW 17.2 (*)    Platelets 461 (*)    Neutro Abs 8.5 (*)    Lymphs Abs 0.8 (*)    All other components within normal limits  URINALYSIS, COMPLETE (UACMP) WITH MICROSCOPIC - Abnormal; Notable for the following:    Color, Urine YELLOW (*)    APPearance HAZY (*)    Hgb urine dipstick MODERATE (*)    Protein, ur 100 (*)    Leukocytes, UA LARGE (*)    Bacteria, UA RARE (*)    Squamous Epithelial / LPF 0-5 (*)    All other components within normal limits  CULTURE, BLOOD (ROUTINE X 2)  CULTURE, BLOOD (ROUTINE X 2)  URINE CULTURE  URINE CULTURE  LACTIC ACID, PLASMA  PROTIME-INR  LACTIC ACID, PLASMA  URINALYSIS, COMPLETE (UACMP) WITH MICROSCOPIC    ____________________________________________  EKG   ____________________________________________  RADIOLOGY  Dg Chest 2 View  Result Date: 06/10/2017 CLINICAL DATA:  Febrile. EXAM: CHEST  2 VIEW COMPARISON:  01/17/2016 FINDINGS: The cardiac silhouette, mediastinal and hilar contours are within normal limits and stable. The lungs are clear. No infiltrates or effusions. The bony thorax is intact. IMPRESSION: No acute cardiopulmonary findings. Electronically Signed   By: Marijo Sanes M.D.   On: 06/10/2017 09:49    ____________________________________________   PROCEDURES  Procedure(s) performed: None  Procedures  Critical Care performed: No  ____________________________________________   INITIAL IMPRESSION / ASSESSMENT AND PLAN / ED COURSE  Pertinent labs & imaging results that were  available during my care of the patient were reviewed by me and considered in my medical decision making (see chart for details).    Clinical Course as of Jun 10 1218  Sun Jun 10, 2017  1025 EGFR Laqueta Jean.): (!) 30 [MQ]    Clinical Course User Index [MQ] Delman Kitten, MD   Fevers and chills. Symptomatology with slightly low blood pressure initially concerning for infectious etiology. No cardiac pulmonary neurologic symptoms. Reassuring abdominal exam. Right-sided CVA tenderness.  Labs reviewed, urinalysis reviewed, suspect likely urinary tract infection or pyelonephritis related to her nephrostomy. We'll admit to the hospital, start ceftriaxone, further workup and care under the hospitalist service.  Patient does report improvement in nausea and symptoms after hydration as well as phenergran.  ____________________________________________   FINAL CLINICAL IMPRESSION(S) / ED DIAGNOSES  Final diagnoses:  Urinary tract infection, acute  Sepsis, due to unspecified organism [facility])      NEW MEDICATIONS STARTED DURING THIS VISIT:  New Prescriptions   No medications on file      Note:  This document was prepared using Dragon voice recognition software and may include unintentional dictation errors.     Delman Kitten, MD 06/10/17 [phone]

## 2017-06-11 ENCOUNTER — Telehealth: Payer: Self-pay | Admitting: Radiology

## 2017-06-11 LAB — BASIC METABOLIC PANEL
Anion gap: 8 (ref 5–15)
BUN: 18 mg/dL (ref 6–20)
CHLORIDE: 105 mmol/L (ref 101–111)
CO2: 21 mmol/L — ABNORMAL LOW (ref 22–32)
CREATININE: 1.51 mg/dL — AB (ref 0.44–1.00)
Calcium: 8.3 mg/dL — ABNORMAL LOW (ref 8.9–10.3)
GFR calc non Af Amer: 39 mL/min — ABNORMAL LOW (ref >60)
GFR, EST AFRICAN AMERICAN: 45 mL/min — AB (ref >60)
Glucose, Bld: 92 mg/dL (ref 65–99)
POTASSIUM: 3.7 mmol/L (ref 3.5–5.1)
SODIUM: 134 mmol/L — AB (ref 135–145)

## 2017-06-11 LAB — CBC
HEMATOCRIT: 19.8 % — AB (ref 35.0–47.0)
HEMOGLOBIN: 6.8 g/dL — AB (ref 12.0–16.0)
MCH: 28.7 pg (ref 26.0–34.0)
MCHC: 34.5 g/dL (ref 32.0–36.0)
MCV: 83 fL (ref 80.0–100.0)
PLATELETS: 303 10*3/uL (ref 150–440)
RBC: 2.38 MIL/uL — AB (ref 3.80–5.20)
RDW: 17 % — ABNORMAL HIGH (ref 11.5–14.5)
WBC: 6.2 10*3/uL (ref 3.6–11.0)

## 2017-06-11 LAB — URINE CULTURE
Culture: <10000 — AB
SPECIAL REQUESTS: NORMAL

## 2017-06-11 LAB — HEMOGLOBIN AND HEMATOCRIT, BLOOD
HEMATOCRIT: 24.7 % — AB (ref 35.0–47.0)
Hemoglobin: 8.3 g/dL — ABNORMAL LOW (ref 12.0–16.0)

## 2017-06-11 LAB — PREPARE RBC (CROSSMATCH)

## 2017-06-11 LAB — ABO/RH: ABO/RH(D): O POS

## 2017-06-11 MED ORDER — ENOXAPARIN SODIUM 40 MG/0.4ML ~~LOC~~ SOLN
40.0000 mg | Freq: Two times a day (BID) | SUBCUTANEOUS | Status: DC
  Administered 2017-06-11 – 2017-06-12 (×2): 40 mg via SUBCUTANEOUS
  Filled 2017-06-11 (×2): qty 0.4

## 2017-06-11 MED ORDER — PROMETHAZINE HCL 25 MG/ML IJ SOLN
12.5000 mg | Freq: Four times a day (QID) | INTRAMUSCULAR | Status: DC | PRN
  Administered 2017-06-11 – 2017-06-12 (×4): 12.5 mg via INTRAVENOUS
  Filled 2017-06-11 (×4): qty 1

## 2017-06-11 MED ORDER — SODIUM CHLORIDE 0.9 % IV SOLN
Freq: Once | INTRAVENOUS | Status: AC
  Administered 2017-06-11: 15:00:00 via INTRAVENOUS

## 2017-06-11 MED ORDER — ACETAMINOPHEN 325 MG PO TABS
650.0000 mg | ORAL_TABLET | Freq: Once | ORAL | Status: AC
  Administered 2017-06-11: 15:00:00 650 mg via ORAL
  Filled 2017-06-11: qty 2

## 2017-06-11 NOTE — Plan of Care (Signed)
Problem: Pain Managment: Goal: General experience of comfort will improve Outcome: Progressing Pt c/o chronic back pain and lower abd pain. Norco given with improvement. Zofran given for nausea with some improvement. Pt vomited once, phenergan given with good effect. Pt afebrile during the shift. VSS. Hgb 6.8 in am. 1xunit of blood tranfused, no transfusion reaction occurred.   Problem: Nutrition: Goal: Adequate nutrition will be maintained Outcome: Not Progressing Poor appetite. Pt refuses dietitian consult at this time.

## 2017-06-11 NOTE — Telephone Encounter (Signed)
-----   Message from Hollice Espy, MD sent at 06/08/2017  8:55 AM EDT ----- Looks like they had to exchange her nephrostomy.  Can you arrange f/u with me in August after her staging CT/ PET scans?    Hollice Espy, MD

## 2017-06-11 NOTE — Progress Notes (Signed)
[place] at [place] NAME: Patricia Rojas    MR#:  [id number]  DATE OF BIRTH:  02/06/1966  SUBJECTIVE:  CHIEF COMPLAINT:   Chief Complaint  Patient presents with  . Fever   Feels weak. Has chronic abdominal pain and back pain which are unchanged. No blood in urine. No blood in stool or melena. Nausea. No vomiting. Feels fatigued.  REVIEW OF SYSTEMS:    Review of Systems  Constitutional: Positive for malaise/fatigue. Negative for chills and fever.  HENT: Negative for sore throat.   Eyes: Negative for blurred vision, double vision and pain.  Respiratory: Negative for cough, hemoptysis, shortness of breath and wheezing.   Cardiovascular: Negative for chest pain, palpitations, orthopnea and leg swelling.  Gastrointestinal: Positive for abdominal pain and nausea. Negative for constipation, diarrhea, heartburn and vomiting.  Genitourinary: Negative for dysuria and hematuria.  Musculoskeletal: Positive for back pain. Negative for joint pain.  Skin: Negative for rash.  Neurological: Positive for weakness. Negative for sensory change, speech change, focal weakness and headaches.  Endo/Heme/Allergies: Does not bruise/bleed easily.  Psychiatric/Behavioral: Negative for depression. The patient is not nervous/anxious.     DRUG ALLERGIES:  No Known Allergies  VITALS:  Blood pressure 110/66, pulse 81, temperature 98.7 F (37.1 C), temperature source Oral, resp. rate 18, height 5\' 5"  (1.651 m), weight 117 kg (257 lb 14.4 oz), last menstrual period 02/28/2017, SpO2 99 %.  PHYSICAL EXAMINATION:   Physical Exam  GENERAL:  51 y.o.-year-old patient lying in the bed with no acute distress.  EYES: Pupils equal, round, reactive to light and accommodation. No scleral icterus. Extraocular muscles intact.  HEENT: Head atraumatic, normocephalic. Oropharynx and nasopharynx clear.  NECK:  Supple, no jugular venous distention. No thyroid enlargement, no tenderness.   LUNGS: Normal breath sounds bilaterally, no wheezing, rales, rhonchi. No use of accessory muscles of respiration.  CARDIOVASCULAR: S1, S2 normal. No murmurs, rubs, or gallops.  ABDOMEN: Soft, nontender, nondistended. Bowel sounds present. No organomegaly or mass. Right nephrostomy tube in place EXTREMITIES: No cyanosis, clubbing or edema b/l.    NEUROLOGIC: Cranial nerves II through XII are intact. No focal Motor or sensory deficits b/l.   PSYCHIATRIC: The patient is alert and oriented x 3.  SKIN: No obvious rash, lesion, or ulcer.   LABORATORY PANEL:   CBC  Recent Labs Lab 06/11/17 0542  WBC 6.2  HGB 6.8*  HCT 19.8*  PLT 303   ------------------------------------------------------------------------------------------------------------------ Chemistries   Recent Labs Lab 06/10/17 0911 06/11/17 0542  NA 134* 134*  K 3.7 3.7  CL 101 105  CO2 24 21*  GLUCOSE 107* 92  BUN 18 18  CREATININE 1.86* 1.51*  CALCIUM 9.0 8.3*  AST 15  --   ALT 6*  --   ALKPHOS 75  --   BILITOT 0.6  --    ------------------------------------------------------------------------------------------------------------------  Cardiac Enzymes No results for input(s): TROPONINI in the last 168 hours. ------------------------------------------------------------------------------------------------------------------  RADIOLOGY:  Dg Chest 2 View  Result Date: 06/10/2017 CLINICAL DATA:  Febrile. EXAM: CHEST  2 VIEW COMPARISON:  01/17/2016 FINDINGS: The cardiac silhouette, mediastinal and hilar contours are within normal limits and stable. The lungs are clear. No infiltrates or effusions. The bony thorax is intact. IMPRESSION: No acute cardiopulmonary findings. Electronically Signed   By: Marijo Sanes M.D.   On: 06/10/2017 09:49     ASSESSMENT AND PLAN:   * Acute on chronic symptomatic anemia We'll transfuse 1 unit packed RBC for symptomatically anemia. Type  and cross. Consent obtained. Repeat  hemoglobin after transfusion. Unstable  * Sepsis due to complicated UTI with right nephrostomy tube in place Continue IV ceftriaxone. wait for cultures.  * Acute kidney injury over CKD stage III. Due to sepsis. Improving with IV fluids.  * Cervical cancer. Follows at the cancer center.  * DVT prophylaxis. SCDs.  All the records are reviewed and case discussed with Care Management/Social Workerr. Management plans discussed with the patient, family and they are in agreement.  CODE STATUS: FULL CODE  TOTAL CRITICAL CARE TIME TAKING CARE OF THIS PATIENT: 35 minutes.   POSSIBLE D/C IN 1-2 DAYS, DEPENDING ON CLINICAL CONDITION.  Hillary Bow R M.D on 06/11/2017 at 12:04 PM  Between 7am to 6pm - Pager - [phone]  After 6pm go to [email or web address] - password [email or web address] [facility]  Office  [phone]  CC: Primary care physician; Patient, No Pcp Per  Note: This dictation was prepared with Dragon dictation along with smaller phrase technology. Any transcriptional errors that result from this process are unintentional.

## 2017-06-11 NOTE — Telephone Encounter (Signed)
LMOM

## 2017-06-12 LAB — BPAM RBC
Blood Product Expiration Date: 201807162359
ISSUE DATE / TIME: 201807021526
UNIT TYPE AND RH: 5100

## 2017-06-12 LAB — TYPE AND SCREEN
ABO/RH(D): O POS
ANTIBODY SCREEN: NEGATIVE
Unit division: 0

## 2017-06-12 LAB — HIV ANTIBODY (ROUTINE TESTING W REFLEX): HIV Screen 4th Generation wRfx: NONREACTIVE

## 2017-06-12 MED ORDER — CIPROFLOXACIN HCL 500 MG PO TABS: 500.0000 mg | ORAL_TABLET | Freq: Two times a day (BID) | ORAL | 0 refills | Status: DC

## 2017-06-12 NOTE — Telephone Encounter (Signed)
Pt is currently admitted to hospital & will call back when she is discharged.

## 2017-06-12 NOTE — Discharge Instructions (Signed)
Resume diet and activity as before  Please call your doctor or return to ER if you see any blood in urine, fever, worsening symptoms

## 2017-06-12 NOTE — Progress Notes (Signed)
Discussed discharge instructions and medications with pt and her boyfriend. IV removed. All questions addressed. Pt transported home via car by her boyfriend.

## 2017-06-14 LAB — URINE CULTURE
Culture: 60000 — AB
Special Requests: NORMAL

## 2017-06-14 NOTE — Discharge Summary (Signed)
[place] at [place] NAME: Patricia Rojas    MR#:  [id number]  DATE OF BIRTH:  [birth year]  DATE OF ADMISSION:  06/10/2017 ADMITTING PHYSICIAN: Demetrios Loll, MD  DATE OF DISCHARGE: 06/12/2017  4:47 PM  PRIMARY CARE PHYSICIAN: Patient, No Pcp Per   ADMISSION DIAGNOSIS:  Urinary tract infection, acute [N39.0] Sepsis, due to unspecified organism ([place]) [A41.9]  DISCHARGE DIAGNOSIS:  Active Problems:   Sepsis ([place])   SECONDARY DIAGNOSIS:   Past Medical History:  Diagnosis Date  . Cancer (HCC)    cervical  . Constipation   . Hemorrhoids   . N&V (nausea and vomiting)   . PONV (postoperative nausea and vomiting)      ADMITTING HISTORY  CHIEF COMPLAINT:      Chief Complaint  Patient presents with  . Fever   Fever, chills, nausea and vomiting today. HISTORY OF PRESENT ILLNESS:  Patricia Rojas  is a 51 y.o. female with a known history of Cervical cancer, status post the nephrostomy tube recently. She's had her gallbladder removed previously. The patient has had fever, chills, nausea and vomited once today. She has hypotension and tachycardia. She was found septic and UTI, treated with Rocephin in the ED.   HOSPITAL COURSE:   * Acute on chronic symptomatic anemia This is most likely anemia of chronic disease with worsening due to chemotherapy. No bleeding. No melena or blood in stool or hematuria. 1 unit packed RBC transfused. Hemoglobin improved from 6.8-8.3.  * Sepsis due to complicated UTI with right nephrostomy tube in place Treated with IV ceftriaxone in the hospital. Urine cultures returned with less than 10,000 colonies. She is being switched to ciprofloxacin for 5 more days after discharge. Follow-up with primary care physician and urology within a week.  * Acute kidney injury over CKD stage III. Due to sepsis.  Resolved and creatinine back to baseline.  * Cervical cancer. Follows at the cancer center.  Stable for discharge home.  Advised to return if she has any fever or hematuria or worsening symptoms.  CONSULTS OBTAINED:    DRUG ALLERGIES:  No Known Allergies  DISCHARGE MEDICATIONS:   Discharge Medication List as of 06/12/2017  2:05 PM    START taking these medications   Details  ciprofloxacin (CIPRO) 500 MG tablet Take 1 tablet (500 mg total) by mouth 2 (two) times daily., Starting Tue 06/12/2017, Normal      CONTINUE these medications which have NOT CHANGED   Details  prochlorperazine (COMPAZINE) 10 MG tablet Take 10 mg by mouth every 6 (six) hours as needed for nausea or vomiting., Historical Med    ALPRAZolam (XANAX) 0.25 MG tablet Take 1-2 tabs daily as needed, Print    amLODipine (NORVASC) 2.5 MG tablet Take 1 tablet (2.5 mg total) by mouth daily., Starting Mon 04/02/2017, Print        Today   VITAL SIGNS:  Blood pressure 116/77, pulse 82, temperature 98.5 F (36.9 C), temperature source Oral, resp. rate 18, height 5\' 5"  (1.651 m), weight 117 kg (257 lb 14.4 oz), last menstrual period 02/28/2017, SpO2 99 %.  I/O:  No intake or output data in the 24 hours ending 06/14/17 1214  PHYSICAL EXAMINATION:  Physical Exam  GENERAL:  51 y.o.-year-old patient lying in the bed with no acute distress.  LUNGS: Normal breath sounds bilaterally, no wheezing, rales,rhonchi or crepitation. No use of accessory muscles of respiration.  CARDIOVASCULAR: S1, S2 normal. No murmurs, rubs, or gallops.  ABDOMEN:  Soft, non-tender, non-distended. Bowel sounds present. No organomegaly or mass.  NEUROLOGIC: Moves all 4 extremities. PSYCHIATRIC: The patient is alert and oriented x 3.  SKIN: No obvious rash, lesion, or ulcer. Right-sided percutaneous nephrostomy tube in place   DATA REVIEW:   CBC  Recent Labs Lab 06/11/17 0542 06/11/17 2116  WBC 6.2  --   HGB 6.8* 8.3*  HCT 19.8* 24.7*  PLT 303  --     Chemistries   Recent Labs Lab 06/10/17 0911 06/11/17 0542  NA 134* 134*  K 3.7 3.7  CL 101 105  CO2  24 21*  GLUCOSE 107* 92  BUN 18 18  CREATININE 1.86* 1.51*  CALCIUM 9.0 8.3*  AST 15  --   ALT 6*  --   ALKPHOS 75  --   BILITOT 0.6  --     Cardiac Enzymes No results for input(s): TROPONINI in the last 168 hours.  Microbiology Results  Results for orders placed or performed during the hospital encounter of 06/10/17  Culture, blood (Routine x 2)     Status: None (Preliminary result)   Collection Time: 06/10/17  9:11 AM  Result Value Ref Range Status   Specimen Description BLOOD RIGHT ANTECUBITAL  Final   Special Requests   Final    BOTTLES DRAWN AEROBIC AND ANAEROBIC Blood Culture adequate volume   Culture NO GROWTH 4 DAYS  Final   Report Status PENDING  Incomplete  Culture, blood (Routine x 2)     Status: None (Preliminary result)   Collection Time: 06/10/17 10:46 AM  Result Value Ref Range Status   Specimen Description BLOOD LEFT ANTECUBITAL  Final   Special Requests   Final    BOTTLES DRAWN AEROBIC AND ANAEROBIC Blood Culture adequate volume   Culture NO GROWTH 4 DAYS  Final   Report Status PENDING  Incomplete  Urine culture     Status: Abnormal   Collection Time: 06/10/17 10:46 AM  Result Value Ref Range Status   Specimen Description   Final    KIDNEY Performed at [facility], [place] [address] [place], [place] [address]    Special Requests Normal  Final   Culture (A)  Final    60,000 COLONIES/mL STAPHYLOCOCCUS AUREUS 40,000 COLONIES/mL ENTEROCOCCUS FAECALIS    Report Status 06/14/2017 FINAL  Final   Organism ID, Bacteria ENTEROCOCCUS FAECALIS (A)  Final   Organism ID, Bacteria STAPHYLOCOCCUS AUREUS (A)  Final      Susceptibility   Enterococcus faecalis - MIC*    AMPICILLIN <=2 SENSITIVE Sensitive     VANCOMYCIN 1 SENSITIVE Sensitive     GENTAMICIN SYNERGY RESISTANT Resistant     * 40,000 COLONIES/mL ENTEROCOCCUS FAECALIS   Staphylococcus aureus - MIC*    CIPROFLOXACIN <=0.5 SENSITIVE Sensitive     ERYTHROMYCIN 4 INTERMEDIATE Intermediate      GENTAMICIN <=0.5 SENSITIVE Sensitive     OXACILLIN <=0.25 SENSITIVE Sensitive     TETRACYCLINE <=1 SENSITIVE Sensitive     VANCOMYCIN <=0.5 SENSITIVE Sensitive     TRIMETH/SULFA <=10 SENSITIVE Sensitive     CLINDAMYCIN <=0.25 SENSITIVE Sensitive     RIFAMPIN <=0.5 SENSITIVE Sensitive     Inducible Clindamycin NEGATIVE Sensitive     * 60,000 COLONIES/mL STAPHYLOCOCCUS AUREUS  Urine culture     Status: Abnormal   Collection Time: 06/10/17 11:55 AM  Result Value Ref Range Status   Specimen Description URINE, CLEAN CATCH  Final   Special Requests Normal  Final   Culture (A)  Final    <10,000 COLONIES/mL INSIGNIFICANT GROWTH Performed at [facility] [address], [place], [place] [address]    Report Status 06/11/2017 FINAL  Final    RADIOLOGY:  No results found.  Follow up with PCP in 1 week.  Management plans discussed with the patient, family and they are in agreement.  CODE STATUS:  Code Status History    Date Active Date Inactive Code Status Order ID Comments User Context   06/10/2017  4:10 PM 06/12/2017  8:04 PM Full Code [id number]  Demetrios Loll, MD Inpatient   04/01/2017  1:09 AM 04/02/2017  2:46 PM Full Code [id number]  Lance Coon, MD ED      TOTAL TIME TAKING CARE OF THIS PATIENT ON DAY OF DISCHARGE: more than 30 minutes.   Hillary Bow R M.D on 06/14/2017 at 12:14 PM  Between 7am to 6pm - Pager - [phone]  After 6pm go to [email or web address] - password [email or web address] [facility]  Office  [phone]  CC: Primary care physician; Patient, No Pcp Per  Note: This dictation was prepared with Dragon dictation along with smaller phrase technology. Any transcriptional errors that result from this process are unintentional.

## 2017-06-14 NOTE — Telephone Encounter (Signed)
-----   Message from Hollice Espy, MD sent at 06/08/2017  8:55 AM EDT ----- Looks like they had to exchange her nephrostomy.  Can you arrange f/u with me in August after her staging CT/ PET scans?    Hollice Espy, MD

## 2017-06-14 NOTE — Telephone Encounter (Signed)
Appt made.  Pt aware.

## 2017-06-15 LAB — CULTURE, BLOOD (ROUTINE X 2)
CULTURE: NO GROWTH
Culture: NO GROWTH
Special Requests: ADEQUATE
Special Requests: ADEQUATE

## 2017-06-27 ENCOUNTER — Inpatient Hospital Stay
Admission: EM | Admit: 2017-06-27 | Discharge: 2017-06-29 | DRG: 690 | Disposition: A | Discharge status: Home / Self Care | Payer: Medicaid Other | Attending: Internal Medicine | Admitting: Internal Medicine

## 2017-06-27 ENCOUNTER — Encounter: Payer: Self-pay | Admitting: Emergency Medicine

## 2017-06-27 ENCOUNTER — Emergency Department: Payer: Medicaid Other

## 2017-06-27 DIAGNOSIS — Z87891 Personal history of nicotine dependence: Secondary | ICD-10-CM | POA: Diagnosis not present

## 2017-06-27 DIAGNOSIS — N183 Chronic kidney disease, stage 3 (moderate): Secondary | ICD-10-CM | POA: Diagnosis present

## 2017-06-27 DIAGNOSIS — N10 Acute tubulo-interstitial nephritis: Principal | ICD-10-CM | POA: Diagnosis present

## 2017-06-27 DIAGNOSIS — B952 Enterococcus as the cause of diseases classified elsewhere: Secondary | ICD-10-CM | POA: Diagnosis present

## 2017-06-27 DIAGNOSIS — N12 Tubulo-interstitial nephritis, not specified as acute or chronic: Secondary | ICD-10-CM

## 2017-06-27 DIAGNOSIS — Z9221 Personal history of antineoplastic chemotherapy: Secondary | ICD-10-CM

## 2017-06-27 DIAGNOSIS — B958 Unspecified staphylococcus as the cause of diseases classified elsewhere: Secondary | ICD-10-CM | POA: Diagnosis present

## 2017-06-27 DIAGNOSIS — Z936 Other artificial openings of urinary tract status: Secondary | ICD-10-CM | POA: Diagnosis not present

## 2017-06-27 DIAGNOSIS — Z923 Personal history of irradiation: Secondary | ICD-10-CM

## 2017-06-27 DIAGNOSIS — Z8541 Personal history of malignant neoplasm of cervix uteri: Secondary | ICD-10-CM

## 2017-06-27 DIAGNOSIS — D638 Anemia in other chronic diseases classified elsewhere: Secondary | ICD-10-CM | POA: Diagnosis present

## 2017-06-27 DIAGNOSIS — Z6841 Body Mass Index (BMI) 40.0 and over, adult: Secondary | ICD-10-CM | POA: Diagnosis not present

## 2017-06-27 HISTORY — DX: Tubulo-interstitial nephritis, not specified as acute or chronic: N12

## 2017-06-27 LAB — COMPREHENSIVE METABOLIC PANEL
ALT: 9 U/L — ABNORMAL LOW (ref 14–54)
ANION GAP: 10 (ref 5–15)
AST: 18 U/L (ref 15–41)
Albumin: 3.2 g/dL — ABNORMAL LOW (ref 3.5–5.0)
Alkaline Phosphatase: 70 U/L (ref 38–126)
BILIRUBIN TOTAL: 0.4 mg/dL (ref 0.3–1.2)
BUN: 15 mg/dL (ref 6–20)
CO2: 22 mmol/L (ref 22–32)
Calcium: 9 mg/dL (ref 8.9–10.3)
Chloride: 105 mmol/L (ref 101–111)
Creatinine, Ser: 1.36 mg/dL — ABNORMAL HIGH (ref 0.44–1.00)
GFR, EST AFRICAN AMERICAN: 51 mL/min — AB (ref >60)
GFR, EST NON AFRICAN AMERICAN: 44 mL/min — AB (ref >60)
Glucose, Bld: 97 mg/dL (ref 65–99)
POTASSIUM: 3.6 mmol/L (ref 3.5–5.1)
Sodium: 137 mmol/L (ref 135–145)
TOTAL PROTEIN: 8.5 g/dL — AB (ref 6.5–8.1)

## 2017-06-27 LAB — URINALYSIS, COMPLETE (UACMP) WITH MICROSCOPIC
Bilirubin Urine: NEGATIVE
GLUCOSE, UA: NEGATIVE mg/dL
Ketones, ur: NEGATIVE mg/dL
Nitrite: NEGATIVE
Protein, ur: 100 mg/dL — AB
SPECIFIC GRAVITY, URINE: 1.018 (ref 1.005–1.030)
pH: 5 (ref 5.0–8.0)

## 2017-06-27 LAB — CBC
HEMATOCRIT: 28.2 % — AB (ref 35.0–47.0)
Hemoglobin: 9.5 g/dL — ABNORMAL LOW (ref 12.0–16.0)
MCH: 28.3 pg (ref 26.0–34.0)
MCHC: 33.8 g/dL (ref 32.0–36.0)
MCV: 83.5 fL (ref 80.0–100.0)
Platelets: 433 10*3/uL (ref 150–440)
RBC: 3.37 MIL/uL — ABNORMAL LOW (ref 3.80–5.20)
RDW: 16.3 % — AB (ref 11.5–14.5)
WBC: 7.1 10*3/uL (ref 3.6–11.0)

## 2017-06-27 LAB — LACTIC ACID, PLASMA
Lactic Acid, Venous: 1.1 mmol/L (ref 0.5–1.9)
Lactic Acid, Venous: 1.6 mmol/L (ref 0.5–1.9)

## 2017-06-27 LAB — LIPASE, BLOOD: Lipase: 23 U/L (ref 11–51)

## 2017-06-27 MED ORDER — ACETAMINOPHEN 650 MG RE SUPP: 650.0000 mg | Freq: Four times a day (QID) | RECTAL | Status: DC | PRN

## 2017-06-27 MED ORDER — HYDROMORPHONE HCL 1 MG/ML IJ SOLN
1.0000 mg | Freq: Once | INTRAMUSCULAR | Status: AC
  Administered 2017-06-27: 1 mg via INTRAVENOUS
  Filled 2017-06-27: qty 1

## 2017-06-27 MED ORDER — SODIUM CHLORIDE 0.9 % IV BOLUS (SEPSIS)
1000.0000 mL | Freq: Once | INTRAVENOUS | Status: AC
  Administered 2017-06-27: 1000 mL via INTRAVENOUS

## 2017-06-27 MED ORDER — ALPRAZOLAM 0.5 MG PO TABS: 0.2500 mg | ORAL_TABLET | Freq: Two times a day (BID) | ORAL | Status: DC | PRN

## 2017-06-27 MED ORDER — ONDANSETRON HCL 4 MG PO TABS
4.0000 mg | ORAL_TABLET | Freq: Four times a day (QID) | ORAL | Status: DC | PRN
Start: 2017-06-27 — End: 2017-06-29

## 2017-06-27 MED ORDER — IOPAMIDOL (ISOVUE-300) INJECTION 61%
100.0000 mL | Freq: Once | INTRAVENOUS | Status: AC | PRN
  Administered 2017-06-27: 100 mL via INTRAVENOUS

## 2017-06-27 MED ORDER — PIPERACILLIN-TAZOBACTAM 3.375 G IVPB 30 MIN
INTRAVENOUS | Status: AC
  Administered 2017-06-27: 3.375 g via INTRAVENOUS
  Filled 2017-06-27: qty 50

## 2017-06-27 MED ORDER — ACETAMINOPHEN 325 MG PO TABS: 650.0000 mg | ORAL_TABLET | Freq: Four times a day (QID) | ORAL | Status: DC | PRN

## 2017-06-27 MED ORDER — PIPERACILLIN-TAZOBACTAM 3.375 G IVPB 30 MIN
3.3750 g | Freq: Once | INTRAVENOUS | Status: AC
  Administered 2017-06-27: 3.375 g via INTRAVENOUS

## 2017-06-27 MED ORDER — HYDROMORPHONE HCL 1 MG/ML IJ SOLN
0.5000 mg | INTRAMUSCULAR | Status: DC | PRN
  Administered 2017-06-27 – 2017-06-29 (×8): 0.5 mg via INTRAVENOUS
  Filled 2017-06-27 (×9): qty 1

## 2017-06-27 MED ORDER — HEPARIN SODIUM (PORCINE) 5000 UNIT/ML IJ SOLN
5000.0000 [IU] | Freq: Three times a day (TID) | INTRAMUSCULAR | Status: DC
  Administered 2017-06-27 – 2017-06-28 (×2): 5000 [IU] via SUBCUTANEOUS
  Filled 2017-06-27 (×2): qty 1

## 2017-06-27 MED ORDER — IOPAMIDOL (ISOVUE-300) INJECTION 61%
30.0000 mL | Freq: Once | INTRAVENOUS | Status: AC | PRN
  Administered 2017-06-27: 30 mL via ORAL

## 2017-06-27 MED ORDER — AMLODIPINE BESYLATE 5 MG PO TABS
2.5000 mg | ORAL_TABLET | Freq: Every day | ORAL | Status: DC
  Administered 2017-06-27 – 2017-06-29 (×3): 2.5 mg via ORAL
  Filled 2017-06-27 (×3): qty 1

## 2017-06-27 MED ORDER — PROMETHAZINE HCL 25 MG/ML IJ SOLN
25.0000 mg | Freq: Four times a day (QID) | INTRAMUSCULAR | Status: DC | PRN
Start: 2017-06-27 — End: 2017-06-29
  Administered 2017-06-27 – 2017-06-29 (×5): 25 mg via INTRAVENOUS
  Filled 2017-06-27 (×6): qty 1

## 2017-06-27 MED ORDER — SENNOSIDES-DOCUSATE SODIUM 8.6-50 MG PO TABS: 1.0000 | ORAL_TABLET | Freq: Every evening | ORAL | Status: DC | PRN

## 2017-06-27 MED ORDER — PROMETHAZINE HCL 25 MG/ML IJ SOLN
25.0000 mg | Freq: Once | INTRAMUSCULAR | Status: AC
  Administered 2017-06-27: 25 mg via INTRAVENOUS
  Filled 2017-06-27: qty 1

## 2017-06-27 MED ORDER — CIPROFLOXACIN IN D5W 400 MG/200ML IV SOLN
400.0000 mg | Freq: Two times a day (BID) | INTRAVENOUS | Status: DC
  Administered 2017-06-27 – 2017-06-28 (×2): 400 mg via INTRAVENOUS
  Filled 2017-06-27 (×3): qty 200

## 2017-06-27 MED ORDER — ONDANSETRON HCL 4 MG/2ML IJ SOLN
4.0000 mg | Freq: Four times a day (QID) | INTRAMUSCULAR | Status: DC | PRN
  Administered 2017-06-27 – 2017-06-28 (×3): 4 mg via INTRAVENOUS
  Filled 2017-06-27 (×3): qty 2

## 2017-06-27 MED ORDER — SODIUM CHLORIDE 0.9 % IV SOLN
INTRAVENOUS | Status: DC
  Administered 2017-06-27 – 2017-06-28 (×2): via INTRAVENOUS

## 2017-06-27 MED ORDER — ALBUTEROL SULFATE (2.5 MG/3ML) 0.083% IN NEBU: 2.5000 mg | INHALATION_SOLUTION | RESPIRATORY_TRACT | Status: DC | PRN

## 2017-06-27 NOTE — ED Notes (Signed)
Patient transported to CT 

## 2017-06-27 NOTE — ED Notes (Signed)
ED Provider at bedside. 

## 2017-06-27 NOTE — ED Provider Notes (Addendum)
[facility] Emergency Department Provider Note  ____________________________________________  Time seen: Approximately 7:28 AM  I have reviewed the triage vital signs and the nursing notes.   HISTORY  Chief Complaint Abdominal Pain    HPI Patricia Rojas is a 51 y.o. female a history of cervical cancer who is completed chemotherapy and localized radiation, with right-sided nephrostomy tube placed remotely for obstruction, presenting with low back pain, lower abdominal pain, nausea and fever. The patient reports that for the past 5 days she has had a "sharp" pain localized to the lower abdomen diffusely but worse on the right side, and in the lower back bilaterally. She has had some nausea and vomiting, although she has chronic nausea and vomiting. This morning, she developed a fever to 100.3 at home. No diarrhea or constipation. No change in the consistency or odor of the urine coming from the nephrostomy bag.   Past Medical History:  Diagnosis Date  . Cancer (HCC)    cervical  . Constipation   . Hemorrhoids   . N&V (nausea and vomiting)   . PONV (postoperative nausea and vomiting)     Patient Active Problem List   Diagnosis Date Noted  . Sepsis ([place]) 06/10/2017  . Intractable nausea and vomiting 04/01/2017  . Goals of care, counseling/discussion 02/18/2017  . Uterine cancer ([place]) 02/07/2017    Past Surgical History:  Procedure Laterality Date  . CESAREAN SECTION  1986  . CHOLECYSTECTOMY    . HERNIA REPAIR    . IR GENERIC HISTORICAL  02/23/2017   IR NEPHROSTOMY PLACEMENT RIGHT 02/23/2017 [name], MD [facility]-INTERV RAD  . IR NEPHROSTOMY EXCHANGE RIGHT  04/18/2017  . IR NEPHROSTOMY EXCHANGE RIGHT  06/08/2017    Current Outpatient Rx  . Order #: [id number] Class: Print  . Order #: [id number] Class: Print  . Order #: [id number] Class: Historical Med  . Order #: [id number] Class: Historical Med    Allergies Patient has no known allergies.  Family  History  Problem Relation Age of Onset  . Cancer Maternal Uncle   . Cancer Paternal Aunt   . Kidney cancer Neg Hx   . Bladder Cancer Neg Hx     Social History Social History  Substance Use Topics  . Smoking status: Former Smoker    Types: Cigarettes    Quit date: 63  . Smokeless tobacco: Never Used  . Alcohol use No    Review of Systems Constitutional: Positive fever. Positive general malaise. Eyes: No visual changes. ENT: No sore throat. No congestion or rhinorrhea. Cardiovascular: Denies chest pain. Denies palpitations. Respiratory: Denies shortness of breath.  No cough. Gastrointestinal: Positive diffuse lower but worse on the right side abdominal pain.  +nausea, +vomiting.  No diarrhea.  No constipation. Genitourinary: Negative for dysuria. No change in urine color. No change in urine odor. Positive right nephrostomy tube. Musculoskeletal: Positive for back pain. Skin: Negative for rash. Neurological: Negative for headaches. No focal numbness, tingling or weakness.     ____________________________________________   PHYSICAL EXAM:  VITAL SIGNS: ED Triage Vitals  Enc Vitals Group     BP 06/27/17 0651 137/79     Pulse Rate 06/27/17 0651 (!) 109     Resp 06/27/17 0651 20     Temp 06/27/17 0651 98.2 F (36.8 C)     Temp Source 06/27/17 0651 Oral     SpO2 06/27/17 0651 97 %     Weight 06/27/17 0650 260 lb (117.9 kg)     Height 06/27/17 0650 5\' 5"  (1.651  m)     Head Circumference --      Peak Flow --      Pain Score 06/27/17 0649 6     Pain Loc --      Pain Edu? --      Excl. in [place]? --     Constitutional: Alert and oriented. Chronically ill and mildly uncomfortable appearing and in no acute distress. Answers questions appropriately. Eyes: Conjunctivae are normal.  EOMI. No scleral icterus. Head: Atraumatic. Nose: No congestion/rhinnorhea. Mouth/Throat: Mucous membranes are dry.  Neck: No stridor.  Supple.  No JVD. No meningismus. Cardiovascular: Fast  rate, regular rhythm. No murmurs, rubs or gallops.  Respiratory: Normal respiratory effort.  No accessory muscle use or retractions. Lungs CTAB.  No wheezes, rales or ronchi. Gastrointestinal: Morbidly obese. Soft, and nondistended.  Positive diffuse CVA and low back pain. Positive tenderness to palpation diffusely through the lower abdomen but worse on the right side. No guarding or rebound.  No peritoneal signs. Genitourinary: Right nephrostomy tube in place which has no surrounding erythema, fluctuance or drainage. Musculoskeletal: No LE edema.  Neurologic:  A&Ox3.  Speech is clear.  Face and smile are symmetric.  EOMI.  Moves all extremities well. Skin:  Skin is warm, dry and intact. No rash noted. Psychiatric: Mood and affect are normal. Speech and behavior are normal.  Normal judgement.  ____________________________________________   LABS (all labs ordered are listed, but only abnormal results are displayed)  Labs Reviewed  CBC - Abnormal; Notable for the following:       Result Value   RBC 3.37 (*)    Hemoglobin 9.5 (*)    HCT 28.2 (*)    RDW 16.3 (*)    All other components within normal limits  COMPREHENSIVE METABOLIC PANEL - Abnormal; Notable for the following:    Creatinine, Ser 1.36 (*)    Total Protein 8.5 (*)    Albumin 3.2 (*)    ALT 9 (*)    GFR calc non Af Amer 44 (*)    GFR calc Af Amer 51 (*)    All other components within normal limits  URINALYSIS, COMPLETE (UACMP) WITH MICROSCOPIC - Abnormal; Notable for the following:    Color, Urine YELLOW (*)    APPearance CLOUDY (*)    Hgb urine dipstick SMALL (*)    Protein, ur 100 (*)    Leukocytes, UA LARGE (*)    Bacteria, UA RARE (*)    Squamous Epithelial / LPF 0-5 (*)    All other components within normal limits  CULTURE, BLOOD (ROUTINE X 2)  CULTURE, BLOOD (ROUTINE X 2)  URINE CULTURE  LACTIC ACID, PLASMA  LIPASE, BLOOD  LACTIC ACID, PLASMA   ____________________________________________  EKG  ED ECG  REPORT I, Eula Listen, the attending physician, personally viewed and interpreted this ECG.   Date: 06/27/2017  EKG Time: 732  Rate: 98  Rhythm: normal sinus rhythm  Axis: leftward  Intervals:none  ST&T Change: No STEMI  ____________________________________________  RADIOLOGY  Ct Abdomen Pelvis W Contrast  Result Date: 06/27/2017 CLINICAL DATA:  Low abdominal pain with fever, nausea and vomiting. Right nephrostomy tube. History of cholecystectomy, C-section and hernia repair. EXAM: CT ABDOMEN AND PELVIS WITH CONTRAST TECHNIQUE: Multidetector CT imaging of the abdomen and pelvis was performed using the standard protocol following bolus administration of intravenous contrast. CONTRAST:  18mL ISOVUE-300 IOPAMIDOL (ISOVUE-300) INJECTION 61% COMPARISON:  CT 02/05/2017.  PET-CT 02/14/2017. FINDINGS: Lower chest: Clear lung bases. No significant pleural or pericardial  effusion. Hepatobiliary: The liver has a stable appearance with focal fat in the left lobe near the falciform ligament. No suspicious hepatic findings. No significant biliary dilatation status post cholecystectomy. Pancreas: Unremarkable. No pancreatic ductal dilatation or surrounding inflammatory changes. Spleen: Normal in size without focal abnormality. Adrenals/Urinary Tract: Both adrenal glands appear normal. Right percutaneous nephrostomy is looped in the right renal pelvis. The collecting system is decompressed. The right kidney demonstrates cortical thinning and mildly delayed contrast excretion. There is no contrast material within the right ureter which is normal in caliber. There is some distal right periureteral soft tissue stranding, similar to the previous study. No evidence of ureteral calculus. The left kidney and collecting system appear normal. No focal bladder abnormalities are seen. Stomach/Bowel: The stomach, small bowel, appendix and proximal colon appear normal. There are sigmoid colon diverticular changes  with mild wall thickening, but no surrounding focal inflammation or extraluminal fluid collection. Vascular/Lymphatic: There are no enlarged abdominal or pelvic lymph nodes. Previously noted prominent external iliac node on the left appears smaller. No acute vascular findings. There is mild aortic atherosclerosis. Reproductive: Known cervical mass remains ill-defined, although appears smaller. There is probable parametrial extension of tumor on the right with associated soft tissue stranding, contributing to the distal right ureteral obstruction. Bladder involvement is difficult to exclude, although does not appear progressive. Other: Stable umbilical hernia containing only fat. Previous repair of supraumbilical hernia. No ascites or free air. Musculoskeletal: No acute or significant osseous findings. IMPRESSION: 1. No acute findings or clear explanation for the patient's symptoms. 2. The patient's known cervical cancer appears smaller with suspected parametrial extension of tumor on the right. This likely contributes to known distal right ureteral obstruction status post interval percutaneous nephrostomy. The right collecting system is decompressed. 3. Sigmoid diverticulosis without evidence of acute inflammation. Electronically Signed   By: Richardean Sale M.D.   On: 06/27/2017 11:39    ____________________________________________   PROCEDURES  Procedure(s) performed: None  Procedures  Critical Care performed: No ____________________________________________   INITIAL IMPRESSION / ASSESSMENT AND PLAN / ED COURSE  Pertinent labs & imaging results that were available during my care of the patient were reviewed by me and considered in my medical decision making (see chart for details).  51 y.o. female with a history of cervical cancer, right nephrostomy tube, presenting with lower abdominal, low back pain associated with nausea and vomiting and fever. Overall, I am most concerned about infectious  process including pyelonephritis or UTI. A retroperitoneal or intra-abdominal abscess is also possible. The patient does have tachycardia and fever, and I'm concerned about sepsis. The patient will receive symptomatic treatment, and undergo CT evaluation. I have written her for him. Zosyn at this time. Laboratory studies are pending as well. Plan reevaluation for final disposition.  ----------------------------------------- 11:52 AM on 06/27/2017 -----------------------------------------  The patient is doing significantly better at this time. She does have a urinary tract infection and with fever, vomiting, and nephrostomy tube, she is high risk and I will plan to admit her to the hospital for continued IV antibiotics and evaluation.  ____________________________________________  FINAL CLINICAL IMPRESSION(S) / ED DIAGNOSES  Final diagnoses:  Pyelonephritis         NEW MEDICATIONS STARTED DURING THIS VISIT:  New Prescriptions   No medications on file      Eula Listen, MD 06/27/17 1153    Eula Listen, MD 06/27/17 1155

## 2017-06-27 NOTE — ED Triage Notes (Addendum)
Patient ambulatory to triage with steady gait, without difficulty or distress noted; pt reports hx cervical CA; since yesterday having fever and lower abd pain (100.3 at home); tylenol taken at 6am; right sided nephrostomy tube in place

## 2017-06-27 NOTE — H&P (Signed)
[facility] NAME: Patricia Rojas    MR#:  [id number]  DATE OF BIRTH:  Dec 14, 1965  DATE OF ADMISSION:  06/27/2017  PRIMARY CARE PHYSICIAN: Patient, No Pcp Per   REQUESTING/REFERRING PHYSICIAN: Eula Listen, MD  CHIEF COMPLAINT:   Chief Complaint  Patient presents with  . Abdominal Pain   Fever, chills,  right-sided abdominal and flank pain for 5 days HISTORY OF PRESENT ILLNESS:  Patricia Rojas  is a 51 y.o. female with a known history of Cervical cancer, anemia, sepsis due to complicated UTI with right nephrostomy tube and CKD stage III. The patient was just discharged 2 weeks ago after treatment of sepsis due to complicated UTI. She was fine until 2 days ago, she started to have fever, chills, right-sided abdominal pain and flank pain. He also complains of nausea and vomiting but no diarrhea. Her urinalysis showed too numerous WBC. PAST MEDICAL HISTORY:   Past Medical History:  Diagnosis Date  . Cancer (HCC)    cervical  . Constipation   . Hemorrhoids   . N&V (nausea and vomiting)   . PONV (postoperative nausea and vomiting)     PAST SURGICAL HISTORY:   Past Surgical History:  Procedure Laterality Date  . CESAREAN SECTION  1986  . CHOLECYSTECTOMY    . HERNIA REPAIR    . IR GENERIC HISTORICAL  02/23/2017   IR NEPHROSTOMY PLACEMENT RIGHT 02/23/2017 [name], MD [facility]-INTERV RAD  . IR NEPHROSTOMY EXCHANGE RIGHT  04/18/2017  . IR NEPHROSTOMY EXCHANGE RIGHT  06/08/2017    SOCIAL HISTORY:   Social History  Substance Use Topics  . Smoking status: Former Smoker    Types: Cigarettes    Quit date: 72  . Smokeless tobacco: Never Used  . Alcohol use No    FAMILY HISTORY:   Family History  Problem Relation Age of Onset  . Cancer Maternal Uncle   . Cancer Paternal Aunt   . Kidney cancer Neg Hx   . Bladder Cancer Neg Hx     DRUG ALLERGIES:  No Known Allergies  REVIEW OF SYSTEMS:   Review of Systems    Constitutional: Positive for chills, fever and malaise/fatigue.  HENT: Negative for sore throat.   Eyes: Negative for blurred vision and double vision.  Respiratory: Negative for cough, hemoptysis, shortness of breath and stridor.   Cardiovascular: Negative for chest pain and leg swelling.  Gastrointestinal: Positive for abdominal pain, nausea and vomiting. Negative for blood in stool, diarrhea and melena.  Genitourinary: Positive for flank pain. Negative for dysuria and hematuria.  Musculoskeletal: Negative for back pain.  Skin: Negative for itching and rash.  Neurological: Positive for weakness. Negative for dizziness, focal weakness, loss of consciousness and headaches.  Psychiatric/Behavioral: Negative for depression. The patient is not nervous/anxious.     MEDICATIONS AT HOME:   Prior to Admission medications   Medication Sig Start Date End Date Taking? Authorizing Provider  ALPRAZolam Duanne Moron) 0.25 MG tablet Take 1-2 tabs daily as needed Patient taking differently: Take 0.125-0.25 mg by mouth 2 (two) times daily as needed.  02/22/17  Yes Lloyd Huger, MD  amLODipine (NORVASC) 2.5 MG tablet Take 1 tablet (2.5 mg total) by mouth daily. 04/02/17  Yes Sudini, Alveta Heimlich, MD  Promethazine HCl (PHENERGAN PO) Take by mouth daily as needed.   Yes [provider]  prochlorperazine (COMPAZINE) 10 MG tablet Take 10 mg by mouth every 6 (six) hours as needed for nausea or vomiting.  [provider]      VITAL SIGNS:  Blood pressure 131/77, pulse 79, temperature 98.2 F (36.8 C), temperature source Oral, resp. rate 17, height 5\' 5"  (1.651 m), weight 260 lb (117.9 kg), last menstrual period 02/28/2017, SpO2 92 %.  PHYSICAL EXAMINATION:  Physical Exam  GENERAL:  51 y.o.-year-old patient lying in the bed with no acute distress. Obese. EYES: Pupils equal, round, reactive to light and accommodation. No scleral icterus. Extraocular muscles intact.  HEENT: Head atraumatic,  normocephalic. Oropharynx and nasopharynx clear.  NECK:  Supple, no jugular venous distention. No thyroid enlargement, no tenderness.  LUNGS: Normal breath sounds bilaterally, no wheezing, rales,rhonchi or crepitation. No use of accessory muscles of respiration.  CARDIOVASCULAR: S1, S2 normal. No murmurs, rubs, or gallops.  ABDOMEN: Soft, tenderness on right side, nondistended. Bowel sounds present. No organomegaly or mass. Right-sided flank pain. EXTREMITIES: No pedal edema, cyanosis, or clubbing.  NEUROLOGIC: Cranial nerves II through XII are intact. Muscle strength 5/5 in all extremities. Sensation intact. Gait not checked.  PSYCHIATRIC: The patient is alert and oriented x 3.  SKIN: No obvious rash, lesion, or ulcer.   LABORATORY PANEL:   CBC  Recent Labs Lab 06/27/17 0718  WBC 7.1  HGB 9.5*  HCT 28.2*  PLT 433   ------------------------------------------------------------------------------------------------------------------  Chemistries   Recent Labs Lab 06/27/17 0718  NA 137  K 3.6  CL 105  CO2 22  GLUCOSE 97  BUN 15  CREATININE 1.36*  CALCIUM 9.0  AST 18  ALT 9*  ALKPHOS 70  BILITOT 0.4   ------------------------------------------------------------------------------------------------------------------  Cardiac Enzymes No results for input(s): TROPONINI in the last 168 hours. ------------------------------------------------------------------------------------------------------------------  RADIOLOGY:  Ct Abdomen Pelvis W Contrast  Result Date: 06/27/2017 CLINICAL DATA:  Low abdominal pain with fever, nausea and vomiting. Right nephrostomy tube. History of cholecystectomy, C-section and hernia repair. EXAM: CT ABDOMEN AND PELVIS WITH CONTRAST TECHNIQUE: Multidetector CT imaging of the abdomen and pelvis was performed using the standard protocol following bolus administration of intravenous contrast. CONTRAST:  161mL ISOVUE-300 IOPAMIDOL (ISOVUE-300) INJECTION  61% COMPARISON:  CT 02/05/2017.  PET-CT 02/14/2017. FINDINGS: Lower chest: Clear lung bases. No significant pleural or pericardial effusion. Hepatobiliary: The liver has a stable appearance with focal fat in the left lobe near the falciform ligament. No suspicious hepatic findings. No significant biliary dilatation status post cholecystectomy. Pancreas: Unremarkable. No pancreatic ductal dilatation or surrounding inflammatory changes. Spleen: Normal in size without focal abnormality. Adrenals/Urinary Tract: Both adrenal glands appear normal. Right percutaneous nephrostomy is looped in the right renal pelvis. The collecting system is decompressed. The right kidney demonstrates cortical thinning and mildly delayed contrast excretion. There is no contrast material within the right ureter which is normal in caliber. There is some distal right periureteral soft tissue stranding, similar to the previous study. No evidence of ureteral calculus. The left kidney and collecting system appear normal. No focal bladder abnormalities are seen. Stomach/Bowel: The stomach, small bowel, appendix and proximal colon appear normal. There are sigmoid colon diverticular changes with mild wall thickening, but no surrounding focal inflammation or extraluminal fluid collection. Vascular/Lymphatic: There are no enlarged abdominal or pelvic lymph nodes. Previously noted prominent external iliac node on the left appears smaller. No acute vascular findings. There is mild aortic atherosclerosis. Reproductive: Known cervical mass remains ill-defined, although appears smaller. There is probable parametrial extension of tumor on the right with associated soft tissue stranding, contributing to the distal right ureteral obstruction. Bladder involvement is difficult to exclude, although does not appear  progressive. Other: Stable umbilical hernia containing only fat. Previous repair of supraumbilical hernia. No ascites or free air. Musculoskeletal: No  acute or significant osseous findings. IMPRESSION: 1. No acute findings or clear explanation for the patient's symptoms. 2. The patient's known cervical cancer appears smaller with suspected parametrial extension of tumor on the right. This likely contributes to known distal right ureteral obstruction status post interval percutaneous nephrostomy. The right collecting system is decompressed. 3. Sigmoid diverticulosis without evidence of acute inflammation. Electronically Signed   By: Richardean Sale M.D.   On: 06/27/2017 11:39      IMPRESSION AND PLAN:   Pyelonephritis. The patient will be admitted to medical floor. She was treated with Zosyn in the ED. Her previous urine culture several days ago showed Escherichia coli, sensitive to Cipro. I will start Cipro 400 mg IV every 12 hours. Follow-up urine culture. IV fluid support and pain control.  CKD stage III. Stable. Anemia of chronic disease. Stable.  All the records are reviewed and case discussed with ED provider. Management plans discussed with the patient, her husband and they are in agreement.  CODE STATUS: Full code  TOTAL TIME TAKING CARE OF THIS PATIENT: 55 minutes.    Demetrios Loll M.D on 06/27/2017 at 1:53 PM  Between 7am to 6pm - Pager - [phone]  After 6pm go to [email or web address] - Proofreader  [facility]  Office  [phone]  CC: Primary care physician; Patient, No Pcp Per   Note: This dictation was prepared with Dragon dictation along with smaller phrase technology. Any transcriptional errors that result from this process are unintentional.

## 2017-06-28 LAB — BASIC METABOLIC PANEL
ANION GAP: 6 (ref 5–15)
BUN: 11 mg/dL (ref 6–20)
CHLORIDE: 108 mmol/L (ref 101–111)
CO2: 24 mmol/L (ref 22–32)
Calcium: 8.9 mg/dL (ref 8.9–10.3)
Creatinine, Ser: 1.33 mg/dL — ABNORMAL HIGH (ref 0.44–1.00)
GFR calc non Af Amer: 45 mL/min — ABNORMAL LOW (ref >60)
GFR, EST AFRICAN AMERICAN: 53 mL/min — AB (ref >60)
Glucose, Bld: 93 mg/dL (ref 65–99)
POTASSIUM: 4.2 mmol/L (ref 3.5–5.1)
Sodium: 138 mmol/L (ref 135–145)

## 2017-06-28 LAB — CBC
HCT: 27.5 % — ABNORMAL LOW (ref 35.0–47.0)
HEMOGLOBIN: 9.2 g/dL — AB (ref 12.0–16.0)
MCH: 28.3 pg (ref 26.0–34.0)
MCHC: 33.5 g/dL (ref 32.0–36.0)
MCV: 84.6 fL (ref 80.0–100.0)
Platelets: 346 10*3/uL (ref 150–440)
RBC: 3.25 MIL/uL — AB (ref 3.80–5.20)
RDW: 16.1 % — ABNORMAL HIGH (ref 11.5–14.5)
WBC: 5.8 10*3/uL (ref 3.6–11.0)

## 2017-06-28 MED ORDER — ENOXAPARIN SODIUM 40 MG/0.4ML ~~LOC~~ SOLN
40.0000 mg | Freq: Two times a day (BID) | SUBCUTANEOUS | Status: DC
  Administered 2017-06-28 – 2017-06-29 (×2): 40 mg via SUBCUTANEOUS
  Filled 2017-06-28 (×2): qty 0.4

## 2017-06-28 MED ORDER — SODIUM CHLORIDE 0.9 % IV SOLN
3.0000 g | Freq: Four times a day (QID) | INTRAVENOUS | Status: DC
  Administered 2017-06-28 – 2017-06-29 (×4): 3 g via INTRAVENOUS
  Filled 2017-06-28 (×7): qty 3

## 2017-06-28 NOTE — Progress Notes (Signed)
Order for enoxaparin entered as 40mg  subQ BID per protocol for BMI > 40 and CrCl > 30 mL/min.  Lenis Noon, PharmD 06/28/17 8:45 AM

## 2017-06-28 NOTE — Progress Notes (Addendum)
[facility] NAME: Patricia Rojas    MR#:  [id number]  DATE OF BIRTH:  May 11, 1966  SUBJECTIVE:   feels better. No fever. No abdominal pain.  REVIEW OF SYSTEMS:   Review of Systems  Constitutional: Negative for chills, fever and weight loss.  HENT: Negative for ear discharge, ear pain and nosebleeds.   Eyes: Negative for blurred vision, pain and discharge.  Respiratory: Negative for sputum production, shortness of breath, wheezing and stridor.   Cardiovascular: Negative for chest pain, palpitations, orthopnea and PND.  Gastrointestinal: Negative for abdominal pain, diarrhea, nausea and vomiting.  Genitourinary: Negative for frequency and urgency.  Musculoskeletal: Negative for back pain and joint pain.  Neurological: Negative for sensory change, speech change, focal weakness and weakness.  Psychiatric/Behavioral: Negative for depression and hallucinations. The patient is not nervous/anxious.    Tolerating Diet: Yes Tolerating PT: Ambulatory  DRUG ALLERGIES:  No Known Allergies  VITALS:  Blood pressure (!) 112/51, pulse 85, temperature 97.6 F (36.4 C), temperature source Oral, resp. rate 18, height 5\' 5"  (1.651 m), weight 117.6 kg (259 lb 4.8 oz), last menstrual period 02/28/2017, SpO2 97 %.  PHYSICAL EXAMINATION:   Physical Exam  GENERAL:  51 y.o.-year-old patient lying in the bed with no acute distress. Obese EYES: Pupils equal, round, reactive to light and accommodation. No scleral icterus. Extraocular muscles intact.  HEENT: Head atraumatic, normocephalic. Oropharynx and nasopharynx clear.  NECK:  Supple, no jugular venous distention. No thyroid enlargement, no tenderness.  LUNGS: Normal breath sounds bilaterally, no wheezing, rales, rhonchi. No use of accessory muscles of respiration.  CARDIOVASCULAR: S1, S2 normal. No murmurs, rubs, or gallops.  ABDOMEN: Soft, nontender, nondistended. Bowel sounds present. No  organomegaly or mass. Right-sided nephrostomy tube skin around the nephrostomy tube looks normal EXTREMITIES: No cyanosis, clubbing or edema b/l.    NEUROLOGIC: Cranial nerves II through XII are intact. No focal Motor or sensory deficits b/l.   PSYCHIATRIC:  patient is alert and oriented x 3.  SKIN: No obvious rash, lesion, or ulcer.   LABORATORY PANEL:  CBC  Recent Labs Lab 06/28/17 0433  WBC 5.8  HGB 9.2*  HCT 27.5*  PLT 346    Chemistries   Recent Labs Lab 06/27/17 0718 06/28/17 0433  NA 137 138  K 3.6 4.2  CL 105 108  CO2 22 24  GLUCOSE 97 93  BUN 15 11  CREATININE 1.36* 1.33*  CALCIUM 9.0 8.9  AST 18  --   ALT 9*  --   ALKPHOS 70  --   BILITOT 0.4  --    Cardiac Enzymes No results for input(s): TROPONINI in the last 168 hours. RADIOLOGY:  Ct Abdomen Pelvis W Contrast  Result Date: 06/27/2017 CLINICAL DATA:  Low abdominal pain with fever, nausea and vomiting. Right nephrostomy tube. History of cholecystectomy, C-section and hernia repair. EXAM: CT ABDOMEN AND PELVIS WITH CONTRAST TECHNIQUE: Multidetector CT imaging of the abdomen and pelvis was performed using the standard protocol following bolus administration of intravenous contrast. CONTRAST:  165mL ISOVUE-300 IOPAMIDOL (ISOVUE-300) INJECTION 61% COMPARISON:  CT 02/05/2017.  PET-CT 02/14/2017. FINDINGS: Lower chest: Clear lung bases. No significant pleural or pericardial effusion. Hepatobiliary: The liver has a stable appearance with focal fat in the left lobe near the falciform ligament. No suspicious hepatic findings. No significant biliary dilatation status post cholecystectomy. Pancreas: Unremarkable. No pancreatic ductal dilatation or surrounding inflammatory changes. Spleen: Normal in size without focal abnormality. Adrenals/Urinary Tract: Both  adrenal glands appear normal. Right percutaneous nephrostomy is looped in the right renal pelvis. The collecting system is decompressed. The right kidney demonstrates  cortical thinning and mildly delayed contrast excretion. There is no contrast material within the right ureter which is normal in caliber. There is some distal right periureteral soft tissue stranding, similar to the previous study. No evidence of ureteral calculus. The left kidney and collecting system appear normal. No focal bladder abnormalities are seen. Stomach/Bowel: The stomach, small bowel, appendix and proximal colon appear normal. There are sigmoid colon diverticular changes with mild wall thickening, but no surrounding focal inflammation or extraluminal fluid collection. Vascular/Lymphatic: There are no enlarged abdominal or pelvic lymph nodes. Previously noted prominent external iliac node on the left appears smaller. No acute vascular findings. There is mild aortic atherosclerosis. Reproductive: Known cervical mass remains ill-defined, although appears smaller. There is probable parametrial extension of tumor on the right with associated soft tissue stranding, contributing to the distal right ureteral obstruction. Bladder involvement is difficult to exclude, although does not appear progressive. Other: Stable umbilical hernia containing only fat. Previous repair of supraumbilical hernia. No ascites or free air. Musculoskeletal: No acute or significant osseous findings. IMPRESSION: 1. No acute findings or clear explanation for the patient's symptoms. 2. The patient's known cervical cancer appears smaller with suspected parametrial extension of tumor on the right. This likely contributes to known distal right ureteral obstruction status post interval percutaneous nephrostomy. The right collecting system is decompressed. 3. Sigmoid diverticulosis without evidence of acute inflammation. Electronically Signed   By: Richardean Sale M.D.   On: 06/27/2017 11:39   ASSESSMENT AND PLAN:   Patricia Rojas is a 51 y.o. female a history of cervical cancer who is completed chemotherapy and localized radiation, with  right-sided nephrostomy tube placed remotely for obstruction, presenting with low back pain, lower abdominal pain, nausea and fever. The patient reports that for the past 5 days she has had a "sharp" pain localized to the lower abdomen diffusely but worse on the right side, and in the lower back bilaterally  1. Acute Right Pyelonephritis with h/o chronic right nephrostomy tube due to distal uretral obstruction from the cervical cancer -June 29 th the right tube was changed---follows Dr Erlene Quan -IV unasyn -UC last time enterococcus -ID consult for recurrent UTI  2.Cervical cancer -follows with Dr Grayland Ormond -SHE IS NOT ON CHEMO currently. HAd Brachytherapy also in the past  3. DVT prophylaxis -lovenox   Case discussed with Care Management/Social Worker. Management plans discussed with the patient, family and they are in agreement.  CODE STATUS:full  DVT Prophylaxis: lovenox  TOTAL TIME TAKING CARE OF THIS PATIENT: 30 minutes.  >50% time spent on counselling and coordination of care  POSSIBLE D/C IN *1-2* DAYS, DEPENDING ON CLINICAL CONDITION.  Note: This dictation was prepared with Dragon dictation along with smaller phrase technology. Any transcriptional errors that result from this process are unintentional.  Tuyet Bader M.D on 06/28/2017 at 4:51 PM  Between 7am to 6pm - Pager - [phone]  After 6pm go to [email or web address] - password [email or web address] [facility]  Office  [phone]  CC: Primary care physician; Patient, No Pcp Per

## 2017-06-28 NOTE — Care Management Note (Signed)
Case Management Note  Patient Details  Name: Patricia Rojas MRN: [id number] Date of Birth: 02-11-1966  Subjective/Objective:     Admitted to [facility] with the diagnosis of pyelonephritis. History of cervical cancer. Lives with boyfriend Marjory Sneddon [phone]).Seen [name] at the [facility] last month.  Prescriptions are filled at [facility].  Sees Dr. Karrie Meres at [facility]. Right nephrostomy tube placed per Dr. Erlene Quan March 2018. Last chemotherapy was May 2018. No home Health. No skilled facility. No medical equipment. Takes care of all basic and instrumental activities of daily living herself, drives. No falls. Decreased appetite. No Port. Boyfriend will transport.               Action/Plan: No follow up needs identified at this time   Expected Discharge Date:                  Expected Discharge Plan:     In-House Referral:     Discharge planning Services     Post Acute Care Choice:    Choice offered to:     DME Arranged:    DME Agency:     HH Arranged:    HH Agency:     Status of Service:     If discussed at [employer], dates discussed:    Additional Comments:  Shelbie Ammons, RN MSN CCM Care Management [phone] 06/28/2017, 11:21 AM

## 2017-06-29 MED ORDER — AMOXICILLIN-POT CLAVULANATE 875-125 MG PO TABS: 1.0000 | ORAL_TABLET | Freq: Two times a day (BID) | ORAL | 0 refills | Status: AC

## 2017-06-29 MED ORDER — AMOXICILLIN-POT CLAVULANATE 875-125 MG PO TABS: 1.0000 | ORAL_TABLET | Freq: Two times a day (BID) | ORAL | Status: DC

## 2017-06-29 NOTE — Consult Note (Signed)
[facility] Infectious Disease     Reason for Consult: UTI   Referring Physician: Nicholes Mango Date of Admission:  06/27/2017  Active Problems:   Pyelonephritis  HPI: Patricia Rojas is a 51 y.o. female with cervical cancer with ureteral obstruction s.p R nephrostomy placement admitted with abdand flank pain, fever and chills and  NV. On admit temp normal wbc 5.8, UA TNTC WBC. Recent admission for same with urine cx growing MSSA and enterococcus. Discharged on 7/3 with cipro for 5 days. She clinically improved but was readmitted with fevers and chills again. UA had TNTC WBC. UCX is growing both enterococcus and staph again.  She has clinically improved and wishes to be dced today if possible. She has completed chemo rad for the cancer. She hopes the nephrostomy tubeswill be able to be removed in next month or two.  Past Medical History:  Diagnosis Date  . Cancer (HCC)    cervical  . Constipation   . Hemorrhoids   . N&V (nausea and vomiting)   . PONV (postoperative nausea and vomiting)    Past Surgical History:  Procedure Laterality Date  . CESAREAN SECTION  1986  . CHOLECYSTECTOMY    . HERNIA REPAIR    . IR GENERIC HISTORICAL  02/23/2017   IR NEPHROSTOMY PLACEMENT RIGHT 02/23/2017 [name], MD [facility]-INTERV RAD  . IR NEPHROSTOMY EXCHANGE RIGHT  04/18/2017  . IR NEPHROSTOMY EXCHANGE RIGHT  06/08/2017   Social History  Substance Use Topics  . Smoking status: Former Smoker    Types: Cigarettes    Quit date: 37  . Smokeless tobacco: Never Used  . Alcohol use No   Family History  Problem Relation Age of Onset  . Cancer Maternal Uncle   . Cancer Paternal Aunt   . Kidney cancer Neg Hx   . Bladder Cancer Neg Hx     Allergies: No Known Allergies  Current antibiotics: Antibiotics Given (last 72 hours)    Date/Time Action Medication Dose Rate   06/27/17 0748 New Bag/Given   piperacillin-tazobactam (ZOSYN) IVPB 3.375 g 3.375 g 100 mL/hr   06/27/17 1719 New Bag/Given    ciprofloxacin (CIPRO) IVPB 400 mg 400 mg 200 mL/hr   06/28/17 0659 New Bag/Given   ciprofloxacin (CIPRO) IVPB 400 mg 400 mg 200 mL/hr   06/28/17 1200 New Bag/Given  [Computer was being worked on by IT]   Ampicillin-Sulbactam (UNASYN) 3 g in sodium chloride 0.9 % 100 mL IVPB 3 g 200 mL/hr   06/28/17 1708 New Bag/Given   Ampicillin-Sulbactam (UNASYN) 3 g in sodium chloride 0.9 % 100 mL IVPB 3 g 200 mL/hr   06/29/17 0009 New Bag/Given   Ampicillin-Sulbactam (UNASYN) 3 g in sodium chloride 0.9 % 100 mL IVPB 3 g 200 mL/hr   06/29/17 0601 New Bag/Given   Ampicillin-Sulbactam (UNASYN) 3 g in sodium chloride 0.9 % 100 mL IVPB 3 g 200 mL/hr      MEDICATIONS: . amLODipine  2.5 mg Oral Daily  . enoxaparin (LOVENOX) injection  40 mg Subcutaneous Q12H    Review of Systems - 11 systems reviewed and negative per HPI   OBJECTIVE: Temp:  [98 F (36.7 C)-98.7 F (37.1 C)] 98 F (36.7 C) (07/20 1248) Pulse Rate:  [82-97] 97 (07/20 1248) Resp:  [18-23] 18 (07/20 1248) BP: (104-119)/(63-68) 119/63 (07/20 1248) SpO2:  [98 %-100 %] 98 % (07/20 1248) Physical Exam  Constitutional:  oriented to person, place, and time. appears well-developed and well-nourished. No distress.  HENT: [place]/AT, PERRLA,  no scleral icterus Mouth/Throat: Oropharynx is clear and moist. No oropharyngeal exudate.  Cardiovascular: Normal rate, regular rhythm and normal heart sounds. Exam reveals no gallop and no friction rub.  No murmur heard.  Pulmonary/Chest: Effort normal and breath sounds normal. No respiratory distress.  has no wheezes.  Neck = supple, no nuchal rigidity Abdominal: Soft. Bowel sounds are normal.  exhibits no distension. There is no tenderness.  R nephrostomy tube in place with clear urine, no tenderness at site Lymphadenopathy: no cervical adenopathy. No axillary adenopathy Neurological: alert and oriented to person, place, and time.  Skin: Skin is warm and dry. No rash noted. No erythema.  Psychiatric: a  normal mood and affect.  behavior is normal.    LABS: Results for orders placed or performed during the hospital encounter of 06/27/17 (from the past 48 hour(s))  Lactic acid, plasma     Status: None   Collection Time: 06/27/17  5:00 PM  Result Value Ref Range   Lactic Acid, Venous 1.6 0.5 - 1.9 mmol/L  Basic metabolic panel     Status: Abnormal   Collection Time: 06/28/17  4:33 AM  Result Value Ref Range   Sodium 138 135 - 145 mmol/L   Potassium 4.2 3.5 - 5.1 mmol/L   Chloride 108 101 - 111 mmol/L   CO2 24 22 - 32 mmol/L   Glucose, Bld 93 65 - 99 mg/dL   BUN 11 6 - 20 mg/dL   Creatinine, Ser 1.33 (H) 0.44 - 1.00 mg/dL   Calcium 8.9 8.9 - 10.3 mg/dL   GFR calc non Af Amer 45 (L) >60 mL/min   GFR calc Af Amer 53 (L) >60 mL/min    Comment: (NOTE) The eGFR has been calculated using the CKD EPI equation. This calculation has not been validated in all clinical situations. eGFR's persistently <60 mL/min signify possible Chronic Kidney Disease.    Anion gap 6 5 - 15  CBC     Status: Abnormal   Collection Time: 06/28/17  4:33 AM  Result Value Ref Range   WBC 5.8 3.6 - 11.0 K/uL   RBC 3.25 (L) 3.80 - 5.20 MIL/uL   Hemoglobin 9.2 (L) 12.0 - 16.0 g/dL   HCT 27.5 (L) 35.0 - 47.0 %   MCV 84.6 80.0 - 100.0 fL   MCH 28.3 26.0 - 34.0 pg   MCHC 33.5 32.0 - 36.0 g/dL   RDW 16.1 (H) 11.5 - 14.5 %   Platelets 346 150 - 440 K/uL   No components found for: ESR, C REACTIVE PROTEIN MICRO: Recent Results (from the past 720 hour(s))  Culture, blood (Routine x 2)     Status: None   Collection Time: 06/10/17  9:11 AM  Result Value Ref Range Status   Specimen Description BLOOD RIGHT ANTECUBITAL  Final   Special Requests   Final    BOTTLES DRAWN AEROBIC AND ANAEROBIC Blood Culture adequate volume   Culture NO GROWTH 5 DAYS  Final   Report Status 06/15/2017 FINAL  Final  Culture, blood (Routine x 2)     Status: None   Collection Time: 06/10/17 10:46 AM  Result Value Ref Range Status    Specimen Description BLOOD LEFT ANTECUBITAL  Final   Special Requests   Final    BOTTLES DRAWN AEROBIC AND ANAEROBIC Blood Culture adequate volume   Culture NO GROWTH 5 DAYS  Final   Report Status 06/15/2017 FINAL  Final  Urine culture     Status: Abnormal   Collection   Time: 06/10/17 10:46 AM  Result Value Ref Range Status   Specimen Description   Final    KIDNEY Performed at [facility], [place] [address], [place], [place] [address]    Special Requests Normal  Final   Culture (A)  Final    60,000 COLONIES/mL STAPHYLOCOCCUS AUREUS 40,000 COLONIES/mL ENTEROCOCCUS FAECALIS    Report Status 06/14/2017 FINAL  Final   Organism ID, Bacteria ENTEROCOCCUS FAECALIS (A)  Final   Organism ID, Bacteria STAPHYLOCOCCUS AUREUS (A)  Final      Susceptibility   Enterococcus faecalis - MIC*    AMPICILLIN <=2 SENSITIVE Sensitive     VANCOMYCIN 1 SENSITIVE Sensitive     GENTAMICIN SYNERGY RESISTANT Resistant     * 40,000 COLONIES/mL ENTEROCOCCUS FAECALIS   Staphylococcus aureus - MIC*    CIPROFLOXACIN <=0.5 SENSITIVE Sensitive     ERYTHROMYCIN 4 INTERMEDIATE Intermediate     GENTAMICIN <=0.5 SENSITIVE Sensitive     OXACILLIN <=0.25 SENSITIVE Sensitive     TETRACYCLINE <=1 SENSITIVE Sensitive     VANCOMYCIN <=0.5 SENSITIVE Sensitive     TRIMETH/SULFA <=10 SENSITIVE Sensitive     CLINDAMYCIN <=0.25 SENSITIVE Sensitive     RIFAMPIN <=0.5 SENSITIVE Sensitive     Inducible Clindamycin NEGATIVE Sensitive     * 60,000 COLONIES/mL STAPHYLOCOCCUS AUREUS  Urine culture     Status: Abnormal   Collection Time: 06/10/17 11:55 AM  Result Value Ref Range Status   Specimen Description URINE, CLEAN CATCH  Final   Special Requests Normal  Final   Culture (A)  Final    <10,000 COLONIES/mL INSIGNIFICANT GROWTH Performed at [facility], [address] [place], [place] [address]    Report Status 06/11/2017 FINAL  Final  Blood Culture (routine x 2)     Status: None (Preliminary result)   Collection  Time: 06/27/17  7:28 AM  Result Value Ref Range Status   Specimen Description BLOOD RIGHT AC  Final   Special Requests   Final    BOTTLES DRAWN AEROBIC AND ANAEROBIC Blood Culture adequate volume   Culture NO GROWTH 2 DAYS  Final   Report Status PENDING  Incomplete  Urine culture     Status: Abnormal (Preliminary result)   Collection Time: 06/27/17  7:29 AM  Result Value Ref Range Status   Specimen Description URINE, RANDOM  Final   Special Requests NONE  Final   Culture (A)  Final    >=100,000 COLONIES/mL STAPHYLOCOCCUS AUREUS 80,000 COLONIES/mL UNIDENTIFIED ORGANISM Performed at [facility], [address] [place], [place] [address]    Report Status PENDING  Incomplete  Blood Culture (routine x 2)     Status: None (Preliminary result)   Collection Time: 06/27/17  7:34 AM  Result Value Ref Range Status   Specimen Description BLOOD LEFT ARM  Final   Special Requests   Final    BOTTLES DRAWN AEROBIC AND ANAEROBIC Blood Culture results may not be optimal due to an inadequate volume of blood received in culture bottles   Culture NO GROWTH 2 DAYS  Final   Report Status PENDING  Incomplete    IMAGING: Dg Chest 2 View  Result Date: 06/10/2017 CLINICAL DATA:  Febrile. EXAM: CHEST  2 VIEW COMPARISON:  01/17/2016 FINDINGS: The cardiac silhouette, mediastinal and hilar contours are within normal limits and stable. The lungs are clear. No infiltrates or effusions. The bony thorax is intact. IMPRESSION: No acute cardiopulmonary findings. Electronically Signed   By: Marijo Sanes  M.D.   On: 06/10/2017 09:49   Ct Abdomen Pelvis W Contrast  Result Date: 06/27/2017 CLINICAL DATA:  Low abdominal pain with fever, nausea and vomiting. Right nephrostomy tube. History of cholecystectomy, C-section and hernia repair. EXAM: CT ABDOMEN AND PELVIS WITH CONTRAST TECHNIQUE: Multidetector CT imaging of the abdomen and pelvis was performed using the standard protocol following bolus administration of  intravenous contrast. CONTRAST:  100mL ISOVUE-300 IOPAMIDOL (ISOVUE-300) INJECTION 61% COMPARISON:  CT 02/05/2017.  PET-CT 02/14/2017. FINDINGS: Lower chest: Clear lung bases. No significant pleural or pericardial effusion. Hepatobiliary: The liver has a stable appearance with focal fat in the left lobe near the falciform ligament. No suspicious hepatic findings. No significant biliary dilatation status post cholecystectomy. Pancreas: Unremarkable. No pancreatic ductal dilatation or surrounding inflammatory changes. Spleen: Normal in size without focal abnormality. Adrenals/Urinary Tract: Both adrenal glands appear normal. Right percutaneous nephrostomy is looped in the right renal pelvis. The collecting system is decompressed. The right kidney demonstrates cortical thinning and mildly delayed contrast excretion. There is no contrast material within the right ureter which is normal in caliber. There is some distal right periureteral soft tissue stranding, similar to the previous study. No evidence of ureteral calculus. The left kidney and collecting system appear normal. No focal bladder abnormalities are seen. Stomach/Bowel: The stomach, small bowel, appendix and proximal colon appear normal. There are sigmoid colon diverticular changes with mild wall thickening, but no surrounding focal inflammation or extraluminal fluid collection. Vascular/Lymphatic: There are no enlarged abdominal or pelvic lymph nodes. Previously noted prominent external iliac node on the left appears smaller. No acute vascular findings. There is mild aortic atherosclerosis. Reproductive: Known cervical mass remains ill-defined, although appears smaller. There is probable parametrial extension of tumor on the right with associated soft tissue stranding, contributing to the distal right ureteral obstruction. Bladder involvement is difficult to exclude, although does not appear progressive. Other: Stable umbilical hernia containing only fat.  Previous repair of supraumbilical hernia. No ascites or free air. Musculoskeletal: No acute or significant osseous findings. IMPRESSION: 1. No acute findings or clear explanation for the patient's symptoms. 2. The patient's known cervical cancer appears smaller with suspected parametrial extension of tumor on the right. This likely contributes to known distal right ureteral obstruction status post interval percutaneous nephrostomy. The right collecting system is decompressed. 3. Sigmoid diverticulosis without evidence of acute inflammation. Electronically Signed   By: William  Veazey M.D.   On: 06/27/2017 11:39   Ir Nephrostomy Exchange Right  Result Date: 06/08/2017 INDICATION: Right ureteral obstruction. EXAM: RIGHT ANTEGRADE NEPHROSTOGRAM.  RIGHT NEPHROSTOMY EXCHANGE. COMPARISON:  None. MEDICATIONS: None. ANESTHESIA/SEDATION: Fentanyl 75 mcg IV; Versed 3 mg IV Moderate Sedation Time:  12 The patient was continuously monitored during the procedure by the interventional radiology nurse under my direct supervision. CONTRAST:  30 cc Isovue-300 - administered into the collecting system(s) FLUOROSCOPY TIME:  Fluoroscopy Time: 1 minutes 6 seconds (28 mGy). COMPLICATIONS: None immediate. PROCEDURE: Informed written consent was obtained from the patient after a thorough discussion of the procedural risks, benefits and alternatives. All questions were addressed. Maximal Sterile Barrier Technique was utilized including caps, mask, sterile gowns, sterile gloves, sterile drape, hand hygiene and skin antiseptic. A timeout was performed prior to the initiation of the procedure. The nephrostomy site was prepped and draped in a sterile fashion. 1% lidocaine was utilized for local anesthesia. Contrast was injected for nephrostogram. The catheter was then exchanged over a Bentson wire. It was looped and string fixed then sewn to the skin. Contrast   was injected. FINDINGS: Antegrade nephrostogram demonstrates persistent right  ureteral occlusion at the pelvis. Final image demonstrates exchange of the right nephrostomy catheter, coiled in the right renal pelvis. IMPRESSION: The right nephrostogram demonstrates persistent right ureteral obstruction at the pelvis. Right nephrostomy exchange was performed. Electronically Signed   By: Marybelle Killings M.D.   On: 06/08/2017 08:47    Assessment:   Patricia Rojas is a 51 y.o. female with cervical cancer s/p chemo rad but complicated by R ureter obstruction s/p nephrostomy tube placed 3/16 and changed 6/9 and 6/29 after nephrostogram showed continued obstruction. She is  now with recurrent UTI with Staph and enterococcus. Previously treated with a short course of cipro but has recurred.   Recommendations The best oral option would be aumgentin which will cover both the enterococcus (amp sensitive) and MSSA. Would rec a 21 day course She can fu with me and with urology. Will be having repeat imaging in August to assess response to chemoradiation.  Hopefully will have had a response and can have tube removed.  I can see in 2-3 weeks time Thank you very much for allowing me to participate in the care of this patient. Please call with questions.   Cheral Marker. Ola Spurr, MD

## 2017-06-29 NOTE — Discharge Summary (Signed)
[facility] [place] NAME: Patricia Rojas    MR#:  [id number]  DATE OF BIRTH:  [birth year]  DATE OF ADMISSION:  06/27/2017 ADMITTING PHYSICIAN: Demetrios Loll, MD  DATE OF DISCHARGE: 7/201/18  PRIMARY CARE PHYSICIAN: Patient, No Pcp Per    ADMISSION DIAGNOSIS:  Pyelonephritis [N12]  DISCHARGE DIAGNOSIS:  Acute right pyelonephritis Chronic indwelling right nehrostomy tube due to distal ureteral obstruction from cervical cancer  SECONDARY DIAGNOSIS:   Past Medical History:  Diagnosis Date  . Cancer (HCC)    cervical  . Constipation   . Hemorrhoids   . N&V (nausea and vomiting)   . PONV (postoperative nausea and vomiting)     HOSPITAL COURSE:   Patricia Rojas a 52 y.o.femalea history of cervical cancer who is completed chemotherapy and localized radiation, with right-sided nephrostomy tube placed remotely for obstruction, presenting with low back pain, lower abdominal pain, nausea and fever. The patient reports that for the past 5 days she has had a "sharp" pain localized to the lower abdomen diffusely but worse on the right side, and in the lower back bilaterally  1. Acute Right Pyelonephritis with h/o chronic right nephrostomy tube due to distal uretral obstruction from the cervical cancer -June 29 th the right tube was changed---follows Dr Erlene Quan -IV unasyn---change to oral augmentin for 3 weeks. F/u Dr Ola Spurr as oupt -UC last time enterococcus -ID consult for recurrent UTI---appreciated  2.Cervical cancer -follows with Dr Grayland Ormond -SHE IS NOT ON CHEMO currently. HAd Brachytherapy also in the past  3. DVT prophylaxis -lovenox  Overall stable d/c home CONSULTS OBTAINED:  Treatment Team:  [name], MD  DRUG ALLERGIES:  No Known Allergies  DISCHARGE MEDICATIONS:   Current Discharge Medication List    START taking these medications   Details  amoxicillin-clavulanate (AUGMENTIN) 875-125 MG tablet  Take 1 tablet by mouth every 12 (twelve) hours. Qty: 42 tablet, Refills: 0      CONTINUE these medications which have NOT CHANGED   Details  ALPRAZolam (XANAX) 0.25 MG tablet Take 1-2 tabs daily as needed Qty: 60 tablet, Refills: 0    amLODipine (NORVASC) 2.5 MG tablet Take 1 tablet (2.5 mg total) by mouth daily. Qty: 30 tablet, Refills: 0    Promethazine HCl (PHENERGAN PO) Take by mouth daily as needed.    prochlorperazine (COMPAZINE) 10 MG tablet Take 10 mg by mouth every 6 (six) hours as needed for nausea or vomiting.        If you experience worsening of your admission symptoms, develop shortness of breath, life threatening emergency, suicidal or homicidal thoughts you must seek medical attention immediately by calling 911 or calling your MD immediately  if symptoms less severe.  You Must read complete instructions/literature along with all the possible adverse reactions/side effects for all the Medicines you take and that have been prescribed to you. Take any new Medicines after you have completely understood and accept all the possible adverse reactions/side effects.   Please note  You were cared for by a hospitalist during your hospital stay. If you have any questions about your discharge medications or the care you received while you were in the hospital after you are discharged, you can call the unit and asked to speak with the hospitalist on call if the hospitalist that took care of you is not available. Once you are discharged, your primary care physician will handle any further medical issues. Please note that NO REFILLS for any  discharge medications will be authorized once you are discharged, as it is imperative that you return to your primary care physician (or establish a relationship with a primary care physician if you do not have one) for your aftercare needs so that they can reassess your need for medications and monitor your lab values. Today   SUBJECTIVE   Feels  ok  VITAL SIGNS:  Blood pressure 119/63, pulse 97, temperature 98 F (36.7 C), temperature source Oral, resp. rate 18, height 5\' 5"  (1.651 m), weight 117.6 kg (259 lb 4.8 oz), last menstrual period 02/28/2017, SpO2 98 %.  I/O:   Intake/Output Summary (Last 24 hours) at 06/29/17 1406 Last data filed at 06/29/17 1200  Gross per 24 hour  Intake              540 ml  Output             1150 ml  Net             -610 ml    PHYSICAL EXAMINATION:  GENERAL:  51 y.o.-year-old patient lying in the bed with no acute distress. obese EYES: Pupils equal, round, reactive to light and accommodation. No scleral icterus. Extraocular muscles intact.  HEENT: Head atraumatic, normocephalic. Oropharynx and nasopharynx clear.  NECK:  Supple, no jugular venous distention. No thyroid enlargement, no tenderness.  LUNGS: Normal breath sounds bilaterally, no wheezing, rales,rhonchi or crepitation. No use of accessory muscles of respiration.  CARDIOVASCULAR: S1, S2 normal. No murmurs, rubs, or gallops.  ABDOMEN: Soft, non-tender, non-distended. Bowel sounds present. No organomegaly or mass. Right nephrostomy tube + EXTREMITIES: No pedal edema, cyanosis, or clubbing.  NEUROLOGIC: Cranial nerves II through XII are intact. Muscle strength 5/5 in all extremities. Sensation intact. Gait not checked.  PSYCHIATRIC: The patient is alert and oriented x 3.  SKIN: No obvious rash, lesion, or ulcer.   DATA REVIEW:   CBC   Recent Labs Lab 06/28/17 0433  WBC 5.8  HGB 9.2*  HCT 27.5*  PLT 346    Chemistries   Recent Labs Lab 06/27/17 0718 06/28/17 0433  NA 137 138  K 3.6 4.2  CL 105 108  CO2 22 24  GLUCOSE 97 93  BUN 15 11  CREATININE 1.36* 1.33*  CALCIUM 9.0 8.9  AST 18  --   ALT 9*  --   ALKPHOS 70  --   BILITOT 0.4  --     Microbiology Results   Recent Results (from the past 240 hour(s))  Blood Culture (routine x 2)     Status: None (Preliminary result)   Collection Time: 06/27/17  7:28 AM   Result Value Ref Range Status   Specimen Description BLOOD RIGHT AC  Final   Special Requests   Final    BOTTLES DRAWN AEROBIC AND ANAEROBIC Blood Culture adequate volume   Culture NO GROWTH 2 DAYS  Final   Report Status PENDING  Incomplete  Urine culture     Status: Abnormal (Preliminary result)   Collection Time: 06/27/17  7:29 AM  Result Value Ref Range Status   Specimen Description URINE, RANDOM  Final   Special Requests NONE  Final   Culture (A)  Final    >=100,000 COLONIES/mL STAPHYLOCOCCUS AUREUS 80,000 COLONIES/mL ENTEROCOCCUS FAECALIS    Report Status PENDING  Incomplete  Blood Culture (routine x 2)     Status: None (Preliminary result)   Collection Time: 06/27/17  7:34 AM  Result Value Ref Range Status   Specimen Description BLOOD  LEFT ARM  Final   Special Requests   Final    BOTTLES DRAWN AEROBIC AND ANAEROBIC Blood Culture results may not be optimal due to an inadequate volume of blood received in culture bottles   Culture NO GROWTH 2 DAYS  Final   Report Status PENDING  Incomplete    RADIOLOGY:  No results found.   Management plans discussed with the patient, family and they are in agreement.  CODE STATUS:     Code Status Orders        Start     Ordered   06/27/17 1640  Full code  Continuous     06/27/17 1639    Code Status History    Date Active Date Inactive Code Status Order ID Comments User Context   06/10/2017  4:10 PM 06/12/2017  8:04 PM Full Code [id number]  Demetrios Loll, MD Inpatient   04/01/2017  1:09 AM 04/02/2017  2:46 PM Full Code [id number]  Lance Coon, MD ED      TOTAL TIME TAKING CARE OF THIS PATIENT: 40 minutes.    Jovonni Borquez M.D on 06/29/2017 at 2:06 PM  Between 7am to 6pm - Pager - [phone] After 6pm go to [email or web address] - password [email or web address] [facility]  Office  [phone]  CC: Primary care physician; Patient, No Pcp Per

## 2017-06-30 LAB — URINE CULTURE

## 2017-07-02 LAB — CULTURE, BLOOD (ROUTINE X 2)
CULTURE: NO GROWTH
CULTURE: NO GROWTH
SPECIAL REQUESTS: ADEQUATE

## 2017-07-26 ENCOUNTER — Telehealth: Payer: Self-pay

## 2017-07-26 NOTE — Telephone Encounter (Signed)
  Oncology Nurse Navigator Documentation Callplaced to [place] to approve funding for pain medication. Navigator Location: [facility] (07/26/17 0900)   )Navigator Encounter Type: Telephone (07/26/17 0900)                                                    Time Spent with Patient: 15 (07/26/17 0900)

## 2017-08-01 ENCOUNTER — Encounter: Payer: Self-pay | Admitting: Urology

## 2017-08-01 ENCOUNTER — Telehealth: Payer: Self-pay | Admitting: Radiology

## 2017-08-01 ENCOUNTER — Other Ambulatory Visit: Payer: Self-pay | Admitting: Radiology

## 2017-08-01 ENCOUNTER — Ambulatory Visit (INDEPENDENT_AMBULATORY_CARE_PROVIDER_SITE_OTHER): Payer: Self-pay | Admitting: Urology

## 2017-08-01 VITALS — BP 138/84 | HR 108 | Ht 65.0 in | Wt 265.0 lb

## 2017-08-01 DIAGNOSIS — C539 Malignant neoplasm of cervix uteri, unspecified: Secondary | ICD-10-CM

## 2017-08-01 DIAGNOSIS — N1330 Unspecified hydronephrosis: Secondary | ICD-10-CM

## 2017-08-01 DIAGNOSIS — N179 Acute kidney failure, unspecified: Secondary | ICD-10-CM

## 2017-08-01 NOTE — Telephone Encounter (Signed)
Pt called in tears stating she had spoken with Dr Ola Spurr who said since the nephrostomy tube & bag wasn't causing pain it was reasonable to keep them instead of converting to Robbins stent d/t pt's concern for stent pain. Pt states she would like to have nephrostomy tube exchange at this time. Please advise.

## 2017-08-02 NOTE — Telephone Encounter (Signed)
Per pt request & ok per Dr Erlene Quan, pt scheduled for right nephrostomy tube exchange on 08/17/17. Made pt aware of procedure date, arrival time, to be npo after mn except to take norvasc with a sip of water, & to have a driver present as pt requested sedation for procedure. Questions answered. Pt voices understanding.

## 2017-08-02 NOTE — Progress Notes (Signed)
08/01/2017 10:25 AM   [name] September 12, 1966 [id number]  Referring provider: No referring provider defined for this encounter.  Chief Complaint  Patient presents with  . Hydronephrosis    HPI: 51 year old female with right hydronephrosis secondary to stage IIIB cervical cancer managed with right PCN.   She is managed Dr. Fransisca Connors, [name], and Dr. Baruch Gouty receiving ciplatin, radiation, and brachytherapy at [facility].   Most recent PET scan 2 on 07/25/2017 shows persistent left DG uptake in the cervix concerning for residual disease.  She understands from her oncologist that she will likely need additional treatment.  Since her last visit, we attempted a clamping trial which was unsuccessful. She developed leakage around the tube, severe pain requiring the tube to be unclamped. She subsequently was admitted twice for pyelonephritis. She is now under the care of Dr. Ola Spurr. She is a chronic daily antibiotics for infection suppression.  Her creatinine remains elevated to 1.33 on 06/28/17 from her baseline of what appears to be 0.68 in  2017.  Right nephrostomy tube placed on 02/23/2017.  Her last nephrostomy tube exchange was on 06/08/2017.  PMH: Past Medical History:  Diagnosis Date  . Cancer (HCC)    cervical  . Constipation   . Hemorrhoids   . N&V (nausea and vomiting)   . PONV (postoperative nausea and vomiting)     Surgical History: Past Surgical History:  Procedure Laterality Date  . CESAREAN SECTION  1986  . CHOLECYSTECTOMY    . HERNIA REPAIR    . IR GENERIC HISTORICAL  02/23/2017   IR NEPHROSTOMY PLACEMENT RIGHT 02/23/2017 [name], MD [facility]  . IR NEPHROSTOMY EXCHANGE RIGHT  04/18/2017  . IR NEPHROSTOMY EXCHANGE RIGHT  06/08/2017    Home Medications:  Allergies as of 08/01/2017   No Known Allergies     Medication List       Accurate as of 08/01/17 11:59 PM. Always use your most recent med list.          ALPRAZolam 0.25 MG  tablet Commonly known as:  XANAX Take 1-2 tabs daily as needed   amLODipine 2.5 MG tablet Commonly known as:  NORVASC Take 1 tablet (2.5 mg total) by mouth daily.   amoxicillin-clavulanate 875-125 MG tablet Commonly known as:  AUGMENTIN Take 1 tablet by mouth 2 (two) times daily.   oxyCODONE 5 MG immediate release tablet Commonly known as:  Oxy IR/ROXICODONE Take by mouth.   PHENERGAN PO Take by mouth daily as needed.   prochlorperazine 10 MG tablet Commonly known as:  COMPAZINE Take 10 mg by mouth every 6 (six) hours as needed for nausea or vomiting.            Discharge Care Instructions        Start     Ordered   08/01/17 0000  IR URETERAL STENT PLACEMENT EXISTING ACCESS RIGHT    Question Answer Comment  Reason for Exam (SYMPTOM  OR DIAGNOSIS REQUIRED) convert PCN to McGovern stent   Is the patient pregnant? No   Preferred Imaging Location? [facility]      08/01/17 0920      Allergies: No Known Allergies  Family History: Family History  Problem Relation Age of Onset  . Cancer Maternal Uncle   . Cancer Paternal Aunt   . Kidney cancer Neg Hx   . Bladder Cancer Neg Hx     Social History:  reports that she quit smoking about 28 years ago. Her smoking use included Cigarettes. She  has never used smokeless tobacco. She reports that she does not drink alcohol or use drugs.  ROS: UROLOGY Frequent Urination?: No Hard to postpone urination?: No Burning/pain with urination?: No Get up at night to urinate?: No Leakage of urine?: No Urine stream starts and stops?: No Trouble starting stream?: No Do you have to strain to urinate?: No Blood in urine?: No Urinary tract infection?: No Sexually transmitted disease?: No Injury to kidneys or bladder?: No Painful intercourse?: No Weak stream?: No Currently pregnant?: No Vaginal bleeding?: No Last menstrual period?: n  Gastrointestinal Nausea?: No Vomiting?: No Indigestion/heartburn?: No Diarrhea?:  No Constipation?: Yes  Constitutional Fever: No Night sweats?: No Weight loss?: No Fatigue?: No  Skin Skin rash/lesions?: No Itching?: No  Eyes Blurred vision?: No Double vision?: No  Ears/Nose/Throat Sore throat?: No Sinus problems?: No  Hematologic/Lymphatic Swollen glands?: No Easy bruising?: No  Cardiovascular Leg swelling?: No Chest pain?: No  Respiratory Cough?: No Shortness of breath?: No  Endocrine Excessive thirst?: No  Musculoskeletal Back pain?: No Joint pain?: No  Neurological Headaches?: No Dizziness?: No  Psychologic Depression?: No Anxiety?: No  Physical Exam: BP 138/84   Pulse (!) 108   Ht 5\' 5"  (1.651 m)   Wt 265 lb (120.2 kg)   LMP 02/28/2017 (Approximate)   BMI 44.10 kg/m   Constitutional:  Alert and oriented, No acute distress.  Tearful  again as on previous  HEENT: [place] AT, moist mucus membranes.  Trachea midline, no masses. Cardiovascular: No clubbing, cyanosis, or edema. Respiratory: Normal respiratory effort, no increased work of breathing. GI: Abdomen is soft, nontender, nondistended, no abdominal masses.  Morbidly obese. GU: Right nephrostomy tube in place draining clear yellow urine.  Skin: No rashes, bruises or suspicious lesions. Neurologic: Grossly intact, no focal deficits, moving all 4 extremities. Psychiatric: Normal mood and affect.  Laboratory Data: Lab Results  Component Value Date   WBC 5.8 06/28/2017   HGB 9.2 (L) 06/28/2017   HCT 27.5 (L) 06/28/2017   MCV 84.6 06/28/2017   PLT 346 06/28/2017    Lab Results  Component Value Date   CREATININE 1.33 (H) 06/28/2017    Urinalysis N/a  Pertinent Imaging: Reviewed PET scan results as above  Assessment & Plan:    1. Hydronephrosis of right kidney Moderate to severe right hydroureteronephrosis down to the level of the pelvis secondary to obstructing  Managed with right nephrostomy tube Recent failed clamp trial followed by episode of  pyelonephritis In light of possible residual disease, hesitant to remove tube at this point- options for internalization with a double-J stent versus replacement of nephrostomy tube discussed. Risk and benefits of each were discussed. Initially patient was positioned in internalization of nephrostomy tube. After discussing this with Dr. Ola Spurr, she called back stating that she prefer to have her nephrostomy tube replaced.  Will arrange for nephrostomy tube exchange and follow-up thereafter.  2. Acute renal failure, unspecified acute renal failure type ([place]) Initially thought to be secondary to obstruction Minimal improvement with tube ? Nephrotoxic chemo  3. Cervical cancer, FIGO stage IIIB (HCC) Additional treatment planned  Return in about 4 weeks (around 08/29/2017).   Hollice Espy, MD  [facility] [address] [place], [place] [address] [phone]

## 2017-08-12 ENCOUNTER — Encounter: Payer: Self-pay | Admitting: Emergency Medicine

## 2017-08-12 ENCOUNTER — Emergency Department: Payer: Medicaid Other

## 2017-08-12 ENCOUNTER — Inpatient Hospital Stay
Admission: EM | Admit: 2017-08-12 | Discharge: 2017-08-15 | DRG: 699 | Disposition: A | Discharge status: Home / Self Care | Payer: Medicaid Other | Attending: Internal Medicine | Admitting: Internal Medicine

## 2017-08-12 DIAGNOSIS — Z87891 Personal history of nicotine dependence: Secondary | ICD-10-CM

## 2017-08-12 DIAGNOSIS — T83512A Infection and inflammatory reaction due to nephrostomy catheter, initial encounter: Secondary | ICD-10-CM | POA: Diagnosis present

## 2017-08-12 DIAGNOSIS — Y846 Urinary catheterization as the cause of abnormal reaction of the patient, or of later complication, without mention of misadventure at the time of the procedure: Secondary | ICD-10-CM | POA: Diagnosis present

## 2017-08-12 DIAGNOSIS — G8929 Other chronic pain: Secondary | ICD-10-CM | POA: Diagnosis present

## 2017-08-12 DIAGNOSIS — Z79891 Long term (current) use of opiate analgesic: Secondary | ICD-10-CM | POA: Diagnosis not present

## 2017-08-12 DIAGNOSIS — Z8541 Personal history of malignant neoplasm of cervix uteri: Secondary | ICD-10-CM

## 2017-08-12 DIAGNOSIS — N390 Urinary tract infection, site not specified: Secondary | ICD-10-CM | POA: Diagnosis present

## 2017-08-12 DIAGNOSIS — N179 Acute kidney failure, unspecified: Secondary | ICD-10-CM | POA: Diagnosis present

## 2017-08-12 DIAGNOSIS — R109 Unspecified abdominal pain: Secondary | ICD-10-CM

## 2017-08-12 DIAGNOSIS — Z79899 Other long term (current) drug therapy: Secondary | ICD-10-CM

## 2017-08-12 DIAGNOSIS — N183 Chronic kidney disease, stage 3 (moderate): Secondary | ICD-10-CM | POA: Diagnosis present

## 2017-08-12 DIAGNOSIS — N10 Acute tubulo-interstitial nephritis: Secondary | ICD-10-CM | POA: Diagnosis present

## 2017-08-12 DIAGNOSIS — R52 Pain, unspecified: Secondary | ICD-10-CM

## 2017-08-12 DIAGNOSIS — F419 Anxiety disorder, unspecified: Secondary | ICD-10-CM | POA: Diagnosis present

## 2017-08-12 LAB — CBC
HCT: 32.3 % — ABNORMAL LOW (ref 35.0–47.0)
Hemoglobin: 11 g/dL — ABNORMAL LOW (ref 12.0–16.0)
MCH: 27.3 pg (ref 26.0–34.0)
MCHC: 34 g/dL (ref 32.0–36.0)
MCV: 80.4 fL (ref 80.0–100.0)
PLATELETS: 318 10*3/uL (ref 150–440)
RBC: 4.02 MIL/uL (ref 3.80–5.20)
RDW: 16.7 % — AB (ref 11.5–14.5)
WBC: 6.1 10*3/uL (ref 3.6–11.0)

## 2017-08-12 LAB — URINALYSIS, COMPLETE (UACMP) WITH MICROSCOPIC
Bilirubin Urine: NEGATIVE
Glucose, UA: NEGATIVE mg/dL
Ketones, ur: NEGATIVE mg/dL
Nitrite: NEGATIVE
PROTEIN: 100 mg/dL — AB
Specific Gravity, Urine: 1.015 (ref 1.005–1.030)
pH: 5 (ref 5.0–8.0)

## 2017-08-12 LAB — COMPREHENSIVE METABOLIC PANEL
ALT: 10 U/L — AB (ref 14–54)
ANION GAP: 8 (ref 5–15)
AST: 16 U/L (ref 15–41)
Albumin: 3.7 g/dL (ref 3.5–5.0)
Alkaline Phosphatase: 72 U/L (ref 38–126)
BUN: 23 mg/dL — ABNORMAL HIGH (ref 6–20)
CHLORIDE: 105 mmol/L (ref 101–111)
CO2: 26 mmol/L (ref 22–32)
CREATININE: 1.49 mg/dL — AB (ref 0.44–1.00)
Calcium: 9.3 mg/dL (ref 8.9–10.3)
GFR, EST AFRICAN AMERICAN: 46 mL/min — AB (ref >60)
GFR, EST NON AFRICAN AMERICAN: 40 mL/min — AB (ref >60)
Glucose, Bld: 109 mg/dL — ABNORMAL HIGH (ref 65–99)
POTASSIUM: 3.7 mmol/L (ref 3.5–5.1)
Sodium: 139 mmol/L (ref 135–145)
Total Bilirubin: 0.5 mg/dL (ref 0.3–1.2)
Total Protein: 8.5 g/dL — ABNORMAL HIGH (ref 6.5–8.1)

## 2017-08-12 LAB — GLUCOSE, CAPILLARY: GLUCOSE-CAPILLARY: 92 mg/dL (ref 65–99)

## 2017-08-12 LAB — LIPASE, BLOOD: LIPASE: 24 U/L (ref 11–51)

## 2017-08-12 MED ORDER — HYDROMORPHONE HCL 1 MG/ML IJ SOLN
1.0000 mg | INTRAMUSCULAR | Status: DC | PRN
  Administered 2017-08-12 – 2017-08-15 (×15): 1 mg via INTRAVENOUS
  Filled 2017-08-12 (×17): qty 1

## 2017-08-12 MED ORDER — TRAZODONE HCL 50 MG PO TABS: 25.0000 mg | ORAL_TABLET | Freq: Every evening | ORAL | Status: DC | PRN

## 2017-08-12 MED ORDER — ACETAMINOPHEN 325 MG PO TABS
650.0000 mg | ORAL_TABLET | Freq: Four times a day (QID) | ORAL | Status: DC | PRN
  Administered 2017-08-15: 650 mg via ORAL
  Filled 2017-08-12: qty 2

## 2017-08-12 MED ORDER — ONDANSETRON HCL 4 MG PO TABS: 4.0000 mg | ORAL_TABLET | Freq: Four times a day (QID) | ORAL | Status: DC | PRN

## 2017-08-12 MED ORDER — HYDROMORPHONE HCL 1 MG/ML IJ SOLN
1.0000 mg | Freq: Once | INTRAMUSCULAR | Status: AC
  Administered 2017-08-12: 1 mg via INTRAVENOUS

## 2017-08-12 MED ORDER — HYDROCODONE-ACETAMINOPHEN 5-325 MG PO TABS
1.0000 | ORAL_TABLET | ORAL | Status: DC | PRN
  Administered 2017-08-13 (×2): 2 via ORAL
  Filled 2017-08-12 (×3): qty 2

## 2017-08-12 MED ORDER — DOCUSATE SODIUM 100 MG PO CAPS
100.0000 mg | ORAL_CAPSULE | Freq: Two times a day (BID) | ORAL | Status: DC
  Administered 2017-08-12 – 2017-08-14 (×4): 100 mg via ORAL
  Filled 2017-08-12 (×4): qty 1

## 2017-08-12 MED ORDER — SODIUM CHLORIDE 0.9 % IV SOLN
INTRAVENOUS | Status: DC
  Administered 2017-08-12 – 2017-08-15 (×6): via INTRAVENOUS

## 2017-08-12 MED ORDER — AMLODIPINE BESYLATE 5 MG PO TABS
2.5000 mg | ORAL_TABLET | Freq: Every day | ORAL | Status: DC
  Administered 2017-08-12 – 2017-08-15 (×4): 2.5 mg via ORAL
  Filled 2017-08-12 (×4): qty 1

## 2017-08-12 MED ORDER — MORPHINE SULFATE (PF) 4 MG/ML IV SOLN
4.0000 mg | Freq: Once | INTRAVENOUS | Status: AC
  Administered 2017-08-12: 4 mg via INTRAVENOUS
  Filled 2017-08-12: qty 1

## 2017-08-12 MED ORDER — BISACODYL 5 MG PO TBEC
5.0000 mg | DELAYED_RELEASE_TABLET | Freq: Every day | ORAL | Status: DC | PRN
  Administered 2017-08-13 – 2017-08-15 (×2): 5 mg via ORAL
  Filled 2017-08-12 (×2): qty 1

## 2017-08-12 MED ORDER — PROMETHAZINE HCL 25 MG/ML IJ SOLN
12.5000 mg | Freq: Four times a day (QID) | INTRAMUSCULAR | Status: DC | PRN
  Administered 2017-08-12 – 2017-08-15 (×10): 12.5 mg via INTRAVENOUS
  Filled 2017-08-12 (×10): qty 1

## 2017-08-12 MED ORDER — ACETAMINOPHEN 650 MG RE SUPP: 650.0000 mg | Freq: Four times a day (QID) | RECTAL | Status: DC | PRN

## 2017-08-12 MED ORDER — ONDANSETRON HCL 4 MG/2ML IJ SOLN
4.0000 mg | Freq: Four times a day (QID) | INTRAMUSCULAR | Status: DC | PRN
  Administered 2017-08-12 – 2017-08-13 (×2): 4 mg via INTRAVENOUS
  Filled 2017-08-12 (×2): qty 2

## 2017-08-12 MED ORDER — CEFTRIAXONE SODIUM 1 G IJ SOLR
1.0000 g | INTRAMUSCULAR | Status: DC
  Administered 2017-08-12: 1 g via INTRAVENOUS
  Filled 2017-08-12: qty 10

## 2017-08-12 MED ORDER — CEFTRIAXONE SODIUM 1 G IJ SOLR
1.0000 g | INTRAMUSCULAR | Status: DC
  Administered 2017-08-13: 1 g via INTRAVENOUS
  Filled 2017-08-12: qty 10

## 2017-08-12 MED ORDER — HEPARIN SODIUM (PORCINE) 5000 UNIT/ML IJ SOLN
5000.0000 [IU] | Freq: Three times a day (TID) | INTRAMUSCULAR | Status: DC
  Filled 2017-08-12: qty 1

## 2017-08-12 MED ORDER — PROCHLORPERAZINE MALEATE 10 MG PO TABS
10.0000 mg | ORAL_TABLET | Freq: Four times a day (QID) | ORAL | Status: DC | PRN
  Administered 2017-08-12 – 2017-08-15 (×6): 10 mg via ORAL
  Filled 2017-08-12 (×9): qty 1

## 2017-08-12 MED ORDER — HYDROMORPHONE HCL 1 MG/ML IJ SOLN
1.0000 mg | Freq: Once | INTRAMUSCULAR | Status: AC
  Administered 2017-08-12: 09:00:00 1 mg via INTRAVENOUS
  Filled 2017-08-12: qty 1

## 2017-08-12 MED ORDER — SODIUM CHLORIDE 0.9 % IV BOLUS (SEPSIS)
1000.0000 mL | INTRAVENOUS | Status: AC
  Administered 2017-08-12: 1000 mL via INTRAVENOUS

## 2017-08-12 MED ORDER — HYDROMORPHONE HCL 1 MG/ML IJ SOLN
INTRAMUSCULAR | Status: AC
  Filled 2017-08-12: qty 1

## 2017-08-12 MED ORDER — ONDANSETRON HCL 4 MG/2ML IJ SOLN
4.0000 mg | INTRAMUSCULAR | Status: AC
  Administered 2017-08-12: 4 mg via INTRAVENOUS
  Filled 2017-08-12: qty 2

## 2017-08-12 NOTE — ED Notes (Signed)
Patient transported to CT 

## 2017-08-12 NOTE — ED Notes (Signed)
Patient given water

## 2017-08-12 NOTE — ED Notes (Signed)
Verbal report given to Michele, RN 

## 2017-08-12 NOTE — ED Provider Notes (Signed)
[facility] Emergency Department Provider Note  ____________________________________________   First MD Initiated Contact with Patient 08/12/17 0400     (approximate)  I have reviewed the triage vital signs and the nursing notes.   HISTORY  Chief Complaint Abdominal Pain and Back Pain    HPI Patricia Rojas is a 51 y.o. female with a medical history that includes cervical cancer with a tumor that is impinging upon her ureter or kidney on the right side. She presents tonight for evaluation of severe acute on chronic pain in her right flank. She has a nephrostomy tube on the right sideand states that it hurtsall the time, but it has been much worse since last night. Her regular oxycodone do not help and moving around makes it worse. She is on Macrobid for prophylaxis but she states the urine has been foul-smelling and she is concerned about infection. She denies fever/chills, chest pain, shortness of breath. She has had some nausea. The pain in her right flank radiates to the right side of her abdomen. she is not currently on chemotherapy, her last dose having been 3 to 4 months ago. She has had some radiation treatment and is going to be evaluated for more.  Past Medical History:  Diagnosis Date  . Cancer (HCC)    cervical  . Constipation   . Hemorrhoids   . N&V (nausea and vomiting)   . PONV (postoperative nausea and vomiting)     Patient Active Problem List   Diagnosis Date Noted  . UTI (urinary tract infection) 08/12/2017  . Pyelonephritis 06/27/2017  . Sepsis ([place]) 06/10/2017  . Intractable nausea and vomiting 04/01/2017  . Goals of care, counseling/discussion 02/18/2017  . Uterine cancer ([place]) 02/07/2017    Past Surgical History:  Procedure Laterality Date  . CESAREAN SECTION  1986  . CHOLECYSTECTOMY    . HERNIA REPAIR    . IR GENERIC HISTORICAL  02/23/2017   IR NEPHROSTOMY PLACEMENT RIGHT 02/23/2017 [name], MD [facility]-INTERV RAD  . IR  NEPHROSTOMY EXCHANGE RIGHT  04/18/2017  . IR NEPHROSTOMY EXCHANGE RIGHT  06/08/2017    Prior to Admission medications   Medication Sig Start Date End Date Taking? Authorizing Provider  ALPRAZolam Duanne Moron) 0.25 MG tablet Take 1-2 tabs daily as needed 02/22/17  Yes Finnegan, Kathlene November, MD  amLODipine (NORVASC) 2.5 MG tablet Take 1 tablet (2.5 mg total) by mouth daily. 04/02/17  Yes Sudini, Alveta Heimlich, MD  nitrofurantoin, macrocrystal-monohydrate, (MACROBID) 100 MG capsule Take 100 mg by mouth daily. 08/01/17  Yes [provider]  oxyCODONE (OXY IR/ROXICODONE) 5 MG immediate release tablet Take 5 mg by mouth every 6 (six) hours as needed for severe pain.  07/25/17 08/24/17 Yes [provider]  prochlorperazine (COMPAZINE) 10 MG tablet Take 10 mg by mouth every 6 (six) hours as needed for nausea or vomiting.   Yes [provider]  promethazine (PHENERGAN) 25 MG suppository Place 25 mg rectally every 6 (six) hours as needed for nausea or vomiting.   Yes [provider]    Allergies Patient has no known allergies.  Family History  Problem Relation Age of Onset  . Cancer Maternal Uncle   . Cancer Paternal Aunt   . Kidney cancer Neg Hx   . Bladder Cancer Neg Hx     Social History Social History  Substance Use Topics  . Smoking status: Former Smoker    Types: Cigarettes    Quit date: 52  . Smokeless tobacco: Never Used  .  Alcohol use No    Review of Systems Constitutional: No fever/chills Eyes: No visual changes. ENT: No sore throat. Cardiovascular: Denies chest pain. Respiratory: Denies shortness of breath. Gastrointestinal: severe right flank pain radiating to the right side of her abdomen.  Nausea, no vomiting.  No diarrhea.  No constipation. Genitourinary: right-sided nephrostomy tube Musculoskeletal: Negative for neck pain.  right flank pain Integumentary: Negative for rash. Neurological: Negative for headaches, focal weakness or  numbness.   ____________________________________________   PHYSICAL EXAM:  VITAL SIGNS: ED Triage Vitals  Enc Vitals Group     BP 08/12/17 0159 122/87     Pulse Rate 08/12/17 0159 86     Resp 08/12/17 0159 12     Temp 08/12/17 0159 98.1 F (36.7 C)     Temp Source 08/12/17 0159 Oral     SpO2 08/12/17 0159 97 %     Weight 08/12/17 0220 120.2 kg (265 lb)     Height 08/12/17 0220 1.651 m (5\' 5" )     Head Circumference --      Peak Flow --      Pain Score 08/12/17 0219 10     Pain Loc --      Pain Edu? --      Excl. in [place]? --     Constitutional: Alert and oriented. appears to be in severe pain Eyes: Conjunctivae are normal.  Head: Atraumatic. Nose: No congestion/rhinnorhea. Mouth/Throat: Mucous membranes are moist. Neck: No stridor.  No meningeal signs.   Cardiovascular: Normal rate, regular rhythm. Good peripheral circulation. Grossly normal heart sounds. Respiratory: Normal respiratory effort.  No retractions. Lungs CTAB. Gastrointestinal: Soft with diffuse tenderness throughout. Right-sided nephrostomy. Musculoskeletal: No lower extremity tenderness nor edema. No gross deformities of extremities. Neurologic:  Normal speech and language. No gross focal neurologic deficits are appreciated.  Skin:  Skin is warm, dry and intact. No rash noted. Psychiatric: Mood and affect are anxious and depressed, crying during interview regarding the pain and her situation in general  ____________________________________________   LABS (all labs ordered are listed, but only abnormal results are displayed)  Labs Reviewed  COMPREHENSIVE METABOLIC PANEL - Abnormal; Notable for the following:       Result Value   Glucose, Bld 109 (*)    BUN 23 (*)    Creatinine, Ser 1.49 (*)    Total Protein 8.5 (*)    ALT 10 (*)    GFR calc non Af Amer 40 (*)    GFR calc Af Amer 46 (*)    All other components within normal limits  CBC - Abnormal; Notable for the following:    Hemoglobin 11.0 (*)     HCT 32.3 (*)    RDW 16.7 (*)    All other components within normal limits  URINALYSIS, COMPLETE (UACMP) WITH MICROSCOPIC - Abnormal; Notable for the following:    Color, Urine YELLOW (*)    APPearance CLOUDY (*)    Hgb urine dipstick SMALL (*)    Protein, ur 100 (*)    Leukocytes, UA LARGE (*)    Bacteria, UA MANY (*)    Squamous Epithelial / LPF 0-5 (*)    All other components within normal limits  URINE CULTURE  LIPASE, BLOOD  GLUCOSE, CAPILLARY   ____________________________________________  EKG  EKG not ordered by ED physician ____________________________________________  RADIOLOGY   Ct Renal Stone Study  Result Date: 08/12/2017 CLINICAL DATA:  51 y/o F; right lower quadrant and right lower back pain. History of cervical cancer.  EXAM: CT ABDOMEN AND PELVIS WITHOUT CONTRAST TECHNIQUE: Multidetector CT imaging of the abdomen and pelvis was performed following the standard protocol without IV contrast. COMPARISON:  06/27/2017 CT abdomen and pelvis FINDINGS: Lower chest: No acute abnormality. Hepatobiliary: No focal liver abnormality is seen. Status post cholecystectomy. No biliary dilatation. Pancreas: Unremarkable. No pancreatic ductal dilatation or surrounding inflammatory changes. Spleen: Normal in size without focal abnormality. Adrenals/Urinary Tract: Right-sided nephrostomy tube is unchanged in position correlating in the right renal pelvis. Decompressed right renal collecting system. No focal kidney lesion identified. Normal appearance of the bladder. Stomach/Bowel: Stomach is within normal limits. Appendix appears normal. No evidence of bowel wall thickening, distention, or inflammatory changes. Stable sigmoid diverticulosis. Vascular/Lymphatic: Aortic atherosclerosis. No enlarged abdominal or pelvic lymph nodes. Reproductive: Unchanged cervical mass eccentric to the right with paracervical fat infiltration inclusive of the right distal ureter. Normal noncontrast appearance of  the uterus and adnexa Other: Small paraumbilical hernia containing fat. Musculoskeletal: No fracture is seen. IMPRESSION: 1. No acute process identified. 2. Stable cervical mass eccentric to the right with infiltration of paracervical fat inclusive of the right distal ureter. 3. Sigmoid diverticulosis without evidence for acute diverticulitis. 4. Stable position of right nephrostomy catheter coiling in the renal pelvis. 5. Mild aortic atherosclerosis. Electronically Signed   By: Kristine Garbe M.D.   On: 08/12/2017 05:32    ____________________________________________   PROCEDURES  Critical Care performed: No   Procedure(s) performed:   Procedures   ____________________________________________   INITIAL IMPRESSION / ASSESSMENT AND PLAN / ED COURSE  Pertinent labs & imaging results that were available during my care of the patient were reviewed by me and considered in my medical decision making (see chart for details).  urinalysis is grossly infected with what appears to be both bacteria and yeast. Lab work is unremarkable except for a slightly increased creatinine over baseline. I will order a urine culture and we will give morphine and obtain a CT renal stone protocol to evaluate for any gross abnormalities. I do not want to risk for kidney function with IV contrast at this time. I giving antibiotics for initial infection treatment although therapy will likely need to be guided by the culture and sensitivities.  I will reassess the patient after medication and further workup to determine the appropriate disposition.vital signs are currently reassuring.   Clinical Course as of Aug 12 902  Sun Aug 12, 2017  2536 the first round of morphine did nothing for the patient's pain.  After a milligram of Dilaudid and her pain is better controlled but is starting to come back again.  Given that she has chronic pain I talked to her about the possibility of going home and using her home  medication but she reported that this pain is much more severe than usual.  She does have a grossly infected urinalysis with an indwelling nephrostomy tube that is growing both bacteria and yeast.  A urine culture is pending and I gave her ceftriaxone but I will discuss with the hospitalist the possibility of admission for intractable pain as well as nephrostomy associated UTI.  I am giving a liter fluids for slightly elevated creatinine as well.  [CF]  [phone] Spoke by phone with Dr. Estanislado Pandy who will admit  [CF]    Clinical Course User Index [CF] Hinda Kehr, MD    ____________________________________________  FINAL CLINICAL IMPRESSION(S) / ED DIAGNOSES  Final diagnoses:  Intractable pain  Right flank pain  Urinary tract infection associated with nephrostomy catheter, initial  encounter [facility])     MEDICATIONS GIVEN DURING THIS VISIT:  Medications  amLODipine (NORVASC) tablet 2.5 mg (not administered)  prochlorperazine (COMPAZINE) tablet 10 mg (not administered)  heparin injection 5,000 Units (not administered)  0.9 %  sodium chloride infusion ( Intravenous New Bag/Given 08/12/17 0903)  acetaminophen (TYLENOL) tablet 650 mg (not administered)    Or  acetaminophen (TYLENOL) suppository 650 mg (not administered)  HYDROcodone-acetaminophen (NORCO/VICODIN) 5-325 MG per tablet 1-2 tablet (not administered)  traZODone (DESYREL) tablet 25 mg (not administered)  docusate sodium (COLACE) capsule 100 mg (not administered)  bisacodyl (DULCOLAX) EC tablet 5 mg (not administered)  ondansetron (ZOFRAN) tablet 4 mg ( Oral See Alternative 08/12/17 0902)    Or  ondansetron (ZOFRAN) injection 4 mg (4 mg Intravenous Given 08/12/17 0902)  cefTRIAXone (ROCEPHIN) 1 g in dextrose 5 % 50 mL IVPB (not administered)  morphine 4 MG/ML injection 4 mg (4 mg Intravenous Given 08/12/17 0449)  ondansetron (ZOFRAN) injection 4 mg (4 mg Intravenous Given 08/12/17 0448)  HYDROmorphone (DILAUDID) injection 1 mg ( Intravenous  Not Given 08/12/17 0700)  morphine 4 MG/ML injection 4 mg (4 mg Intravenous Given 08/12/17 0635)  sodium chloride 0.9 % bolus 1,000 mL (1,000 mLs Intravenous Transfusing/Transfer 08/12/17 0800)     NEW OUTPATIENT MEDICATIONS STARTED DURING THIS VISIT:  Current Discharge Medication List      Current Discharge Medication List      Current Discharge Medication List       Note:  This document was prepared using Dragon voice recognition software and may include unintentional dictation errors.    Hinda Kehr, MD 08/12/17 [phone]

## 2017-08-12 NOTE — H&P (Addendum)
[place] [facility] [place] NAME: Patricia Rojas    MR#:  [id number]  DATE OF BIRTH:  January 23, 1966  DATE OF ADMISSION:  08/12/2017  PRIMARY CARE PHYSICIAN: Patient, No Pcp Per   REQUESTING/REFERRING PHYSICIAN: Hinda Kehr, MD  CHIEF COMPLAINT:   Chief Complaint  Patient presents with  . Abdominal Pain  . Back Pain    HISTORY OF PRESENT ILLNESS:  Patricia Rojas  is a 51 y.o. female with a known history of cervical cancer with a tumor that is impinging upon her ureter or kidney on the right side. She presented for evaluation of severe acute on chronic pain in her right flank. She has a nephrostomy tube on the right side and states that it hurtsall the time, but it has been much worse since last night. Her regular oxycodone do not help and moving around makes it worse. She is on Macrobid for prophylaxis but she states the urine has been foul-smelling and she is concerned about infection. She has had some nausea. The pain in her right flank radiates to the right side of her abdomen. she is not currently on chemotherapy, her last dose having been 3 to 4 months ago. She has had some radiation treatment and is going to be evaluated for more.  While in the ED she was noted to have a UTI and is being admitted for further evaluation and management. PAST MEDICAL HISTORY:   Past Medical History:  Diagnosis Date  . Cancer (HCC)    cervical  . Constipation   . Hemorrhoids   . N&V (nausea and vomiting)   . PONV (postoperative nausea and vomiting)     PAST SURGICAL HISTORY:   Past Surgical History:  Procedure Laterality Date  . CESAREAN SECTION  1986  . CHOLECYSTECTOMY    . HERNIA REPAIR    . IR GENERIC HISTORICAL  02/23/2017   IR NEPHROSTOMY PLACEMENT RIGHT 02/23/2017 Patricia Cleveland, MD [facility]-INTERV RAD  . IR NEPHROSTOMY EXCHANGE RIGHT  04/18/2017  . IR NEPHROSTOMY EXCHANGE RIGHT  06/08/2017    SOCIAL HISTORY:   Social History  Substance Use Topics  . Smoking  status: Former Smoker    Types: Cigarettes    Quit date: 64  . Smokeless tobacco: Never Used  . Alcohol use No    FAMILY HISTORY:   Family History  Problem Relation Age of Onset  . Cancer Maternal Uncle   . Cancer Paternal Aunt   . Kidney cancer Neg Hx   . Bladder Cancer Neg Hx     DRUG ALLERGIES:  No Known Allergies  REVIEW OF SYSTEMS:   Review of Systems  Constitutional: Negative for chills, fever and weight loss.  HENT: Negative for nosebleeds and sore throat.   Eyes: Negative for blurred vision.  Respiratory: Negative for cough, shortness of breath and wheezing.   Cardiovascular: Negative for chest pain, orthopnea, leg swelling and PND.  Gastrointestinal: Negative for abdominal pain, constipation, diarrhea, heartburn, nausea and vomiting.  Genitourinary: Positive for flank pain. Negative for dysuria and urgency.  Musculoskeletal: Negative for back pain.  Skin: Negative for rash.  Neurological: Negative for dizziness, speech change, focal weakness and headaches.  Endo/Heme/Allergies: Does not bruise/bleed easily.  Psychiatric/Behavioral: Negative for depression.   MEDICATIONS AT HOME:   Prior to Admission medications   Medication Sig Start Date End Date Taking? Authorizing Provider  ALPRAZolam Duanne Moron) 0.25 MG tablet Take 1-2 tabs daily as needed 02/22/17  Yes Lloyd Huger, MD  amLODipine (NORVASC) 2.5 MG tablet Take 1 tablet (2.5 mg total) by mouth daily. 04/02/17  Yes Sudini, Alveta Heimlich, MD  nitrofurantoin, macrocrystal-monohydrate, (MACROBID) 100 MG capsule Take 100 mg by mouth daily. 08/01/17  Yes [provider]  oxyCODONE (OXY IR/ROXICODONE) 5 MG immediate release tablet Take 5 mg by mouth every 6 (six) hours as needed for severe pain.  07/25/17 08/24/17 Yes [provider]  prochlorperazine (COMPAZINE) 10 MG tablet Take 10 mg by mouth every 6 (six) hours as needed for nausea or vomiting.   Yes [provider]  promethazine (PHENERGAN)  25 MG suppository Place 25 mg rectally every 6 (six) hours as needed for nausea or vomiting.   Yes [provider]      VITAL SIGNS:  Blood pressure 139/83, pulse 80, temperature 98.4 F (36.9 C), temperature source Oral, resp. rate 20, height 5\' 5"  (1.651 m), weight 114.9 kg (253 lb 3.2 oz), last menstrual period 02/28/2017, SpO2 98 %. PHYSICAL EXAMINATION:  Physical Exam  GENERAL:  51 y.o.-year-old patient lying in the bed with no acute distress.  EYES: Pupils equal, round, reactive to light and accommodation. No scleral icterus. Extraocular muscles intact.  HEENT: Head atraumatic, normocephalic. Oropharynx and nasopharynx clear.  NECK:  Supple, no jugular venous distention. No thyroid enlargement, no tenderness.  LUNGS: Normal breath sounds bilaterally, no wheezing, rales,rhonchi or crepitation. No use of accessory muscles of respiration.  CARDIOVASCULAR: S1, S2 normal. No murmurs, rubs, or gallops.  ABDOMEN: Soft, nontender, nondistended. Bowel sounds present. No organomegaly or mass. Right-sided nephrostomy. EXTREMITIES: No pedal edema, cyanosis, or clubbing.  NEUROLOGIC: Cranial nerves II through XII are intact. Muscle strength 5/5 in all extremities. Sensation intact. Gait not checked.  PSYCHIATRIC: The patient is alert and oriented x 3.  SKIN: No obvious rash, lesion, or ulcer.  LABORATORY PANEL:   CBC  Recent Labs Lab 08/12/17 0323  WBC 6.1  HGB 11.0*  HCT 32.3*  PLT 318   ------------------------------------------------------------------------------------------------------------------  Chemistries   Recent Labs Lab 08/12/17 0323  NA 139  K 3.7  CL 105  CO2 26  GLUCOSE 109*  BUN 23*  CREATININE 1.49*  CALCIUM 9.3  AST 16  ALT 10*  ALKPHOS 72  BILITOT 0.5   ------------------------------------------------------------------------------------------------------------------  Cardiac Enzymes No results for input(s): TROPONINI in the last 168  hours. ------------------------------------------------------------------------------------------------------------------  RADIOLOGY:  Ct Renal Stone Study  Result Date: 08/12/2017 CLINICAL DATA:  51 y/o F; right lower quadrant and right lower back pain. History of cervical cancer. EXAM: CT ABDOMEN AND PELVIS WITHOUT CONTRAST TECHNIQUE: Multidetector CT imaging of the abdomen and pelvis was performed following the standard protocol without IV contrast. COMPARISON:  06/27/2017 CT abdomen and pelvis FINDINGS: Lower chest: No acute abnormality. Hepatobiliary: No focal liver abnormality is seen. Status post cholecystectomy. No biliary dilatation. Pancreas: Unremarkable. No pancreatic ductal dilatation or surrounding inflammatory changes. Spleen: Normal in size without focal abnormality. Adrenals/Urinary Tract: Right-sided nephrostomy tube is unchanged in position correlating in the right renal pelvis. Decompressed right renal collecting system. No focal kidney lesion identified. Normal appearance of the bladder. Stomach/Bowel: Stomach is within normal limits. Appendix appears normal. No evidence of bowel wall thickening, distention, or inflammatory changes. Stable sigmoid diverticulosis. Vascular/Lymphatic: Aortic atherosclerosis. No enlarged abdominal or pelvic lymph nodes. Reproductive: Unchanged cervical mass eccentric to the right with paracervical fat infiltration inclusive of the right distal ureter. Normal noncontrast appearance of the uterus and adnexa Other: Small paraumbilical hernia containing fat. Musculoskeletal: No fracture is seen. IMPRESSION: 1. No acute  process identified. 2. Stable cervical mass eccentric to the right with infiltration of paracervical fat inclusive of the right distal ureter. 3. Sigmoid diverticulosis without evidence for acute diverticulitis. 4. Stable position of right nephrostomy catheter coiling in the renal pelvis. 5. Mild aortic atherosclerosis. Electronically Signed   By:  Kristine Garbe M.D.   On: 08/12/2017 05:32   IMPRESSION AND PLAN:  52 year old female with known history of cervical cancer with a tumor that is impinging upon her ureter or kidney on the right side. She presented for evaluation of severe acute on chronic pain in her right flank  *Acute pyelonephritis -IV Rocephin -Urine culture -Pain control  *Acute on chronic kidney disease stage III -Fluids and avoid nephrotoxic medications  *Anxiety -Continue Xanax  *History of cervical cancer -Outpatient follow-up at [place] the records are reviewed and case discussed with ED provider. Management plans discussed with the patient, family and they are in agreement.  CODE STATUS: Full Code  TOTAL TIME TAKING CARE OF THIS PATIENT: 35 minutes.    Max Sane M.D on 08/12/2017 at 11:59 AM  Between 7am to 6pm - Pager - [phone]  After 6pm go to [email or web address] - Proofreader  [facility]  Office  [phone]  CC: Primary care physician; Patient, No Pcp Per   Note: This dictation was prepared with Dragon dictation along with smaller phrase technology. Any transcriptional errors that result from this process are unintentional.

## 2017-08-12 NOTE — ED Triage Notes (Signed)
Pt c/o right lower quadrant and right lower back pain since she was diagnosed with cervical cancer in February; pt is in the same location it always is, just worse; takes Oxycodone for pain but it doesn't seem to be helping at this time; last dose of Oxycodone was at 2330 last night; pt with nephrostomy tube to right back that was placed as "a preventive measure since the tumor is so close to my kidney"; currently on antibiotic because "I keep getting an infection from the tube" but pt is unable to state which antibiotic she's on

## 2017-08-13 LAB — BASIC METABOLIC PANEL
Anion gap: 4 — ABNORMAL LOW (ref 5–15)
BUN: 16 mg/dL (ref 6–20)
CHLORIDE: 108 mmol/L (ref 101–111)
CO2: 27 mmol/L (ref 22–32)
CREATININE: 1.31 mg/dL — AB (ref 0.44–1.00)
Calcium: 8.7 mg/dL — ABNORMAL LOW (ref 8.9–10.3)
GFR calc non Af Amer: 46 mL/min — ABNORMAL LOW (ref >60)
GFR, EST AFRICAN AMERICAN: 54 mL/min — AB (ref >60)
Glucose, Bld: 103 mg/dL — ABNORMAL HIGH (ref 65–99)
POTASSIUM: 4 mmol/L (ref 3.5–5.1)
Sodium: 139 mmol/L (ref 135–145)

## 2017-08-13 LAB — CBC
HEMATOCRIT: 28.2 % — AB (ref 35.0–47.0)
Hemoglobin: 9.5 g/dL — ABNORMAL LOW (ref 12.0–16.0)
MCH: 27.3 pg (ref 26.0–34.0)
MCHC: 33.6 g/dL (ref 32.0–36.0)
MCV: 81.3 fL (ref 80.0–100.0)
PLATELETS: 261 10*3/uL (ref 150–440)
RBC: 3.46 MIL/uL — AB (ref 3.80–5.20)
RDW: 16.5 % — ABNORMAL HIGH (ref 11.5–14.5)
WBC: 5.8 10*3/uL (ref 3.6–11.0)

## 2017-08-13 LAB — GLUCOSE, CAPILLARY: Glucose-Capillary: 93 mg/dL (ref 65–99)

## 2017-08-13 LAB — URINE CULTURE
Culture: >=100000 — AB
Special Requests: NORMAL

## 2017-08-13 MED ORDER — FLUCONAZOLE 100 MG PO TABS
200.0000 mg | ORAL_TABLET | Freq: Every day | ORAL | Status: AC
  Administered 2017-08-13 – 2017-08-15 (×3): 200 mg via ORAL
  Filled 2017-08-13: qty 2
  Filled 2017-08-13: qty 1
  Filled 2017-08-13: qty 2

## 2017-08-13 NOTE — Progress Notes (Signed)
[place] at [place] NAME: Elky Funches    MR#:  [id number]  DATE OF BIRTH:  Mar 10, 1966  SUBJECTIVE:  CHIEF COMPLAINT:   Chief Complaint  Patient presents with  . Abdominal Pain  . Back Pain  Continues to have right lower quadrant pain REVIEW OF SYSTEMS:  Review of Systems  Constitutional: Negative for chills, fever and weight loss.  HENT: Negative for nosebleeds and sore throat.   Eyes: Negative for blurred vision.  Respiratory: Negative for cough, shortness of breath and wheezing.   Cardiovascular: Negative for chest pain, orthopnea, leg swelling and PND.  Gastrointestinal: Positive for abdominal pain. Negative for constipation, diarrhea, heartburn, nausea and vomiting.  Genitourinary: Positive for flank pain. Negative for dysuria and urgency.  Musculoskeletal: Negative for back pain.  Skin: Negative for rash.  Neurological: Negative for dizziness, speech change, focal weakness and headaches.  Endo/Heme/Allergies: Does not bruise/bleed easily.  Psychiatric/Behavioral: Negative for depression.    DRUG ALLERGIES:  No Known Allergies VITALS:  Blood pressure 129/78, pulse 85, temperature 98.6 F (37 C), temperature source Oral, resp. rate 20, height 5\' 5"  (1.651 m), weight 114.9 kg (253 lb 4.8 oz), last menstrual period 02/28/2017, SpO2 98 %. PHYSICAL EXAMINATION:  Physical Exam  Constitutional: She is oriented to person, place, and time and well-developed, well-nourished, and in no distress.  HENT:  Head: Normocephalic and atraumatic.  Eyes: Pupils are equal, round, and reactive to light. Conjunctivae and EOM are normal.  Neck: Normal range of motion. Neck supple. No tracheal deviation present. No thyromegaly present.  Cardiovascular: Normal rate, regular rhythm and normal heart sounds.   Pulmonary/Chest: Effort normal and breath sounds normal. No respiratory distress. She has no wheezes. She exhibits no tenderness.    Abdominal: Soft. Bowel sounds are normal. She exhibits no distension. There is tenderness in the right lower quadrant.  Musculoskeletal: Normal range of motion.  Neurological: She is alert and oriented to person, place, and time. No cranial nerve deficit.  Skin: Skin is warm and dry. No rash noted.  Psychiatric: Mood and affect normal.   LABORATORY PANEL:  Female CBC  Recent Labs Lab 08/13/17 0317  WBC 5.8  HGB 9.5*  HCT 28.2*  PLT 261   ------------------------------------------------------------------------------------------------------------------ Chemistries   Recent Labs Lab 08/12/17 0323 08/13/17 0317  NA 139 139  K 3.7 4.0  CL 105 108  CO2 26 27  GLUCOSE 109* 103*  BUN 23* 16  CREATININE 1.49* 1.31*  CALCIUM 9.3 8.7*  AST 16  --   ALT 10*  --   ALKPHOS 72  --   BILITOT 0.5  --    RADIOLOGY:  No results found. ASSESSMENT AND PLAN:  51 year old female with known history of cervical cancer with a tumor that is impinging upon her ureter or kidney on the right side. She presented for evaluation of severe acute on chronic pain in her right flank  *Acute pyelonephritis -Urine culture is growing 100,000 colonies of yeast -We will discontinue Rocephin and start her on oral fluconazole for 3 days -Seems to me her pain could be coming from her nephrostomy tube on the right side because that is where her pain is.  She is scheduled to have this removed coming Friday. -CT abdomen was negative for any acute pathology on admission  *Acute on chronic kidney disease stage III -Continue fluids and avoid nephrotoxic medications  *Anxiety -Continue Xanax  *History of cervical cancer -  Outpatient follow-up at [facility] records are reviewed and case discussed with Care Management/Social Worker. Management plans discussed with the patient, nursing and they are in agreement.  CODE STATUS: Full Code  TOTAL TIME TAKING CARE OF THIS PATIENT: 35 minutes.    More than 50% of the time was spent in counseling/coordination of care: YES  POSSIBLE D/C IN 1-2 DAYS, DEPENDING ON CLINICAL CONDITION.   Max Sane M.D on 08/13/2017 at 11:45 AM  Between 7am to 6pm - Pager - [phone]  After 6pm go to [email or web address] - Proofreader  [facility]  Office  [phone]  CC: Primary care physician; Patient, No Pcp Per  Note: This dictation was prepared with Dragon dictation along with smaller phrase technology. Any transcriptional errors that result from this process are unintentional.

## 2017-08-14 DIAGNOSIS — R52 Pain, unspecified: Secondary | ICD-10-CM

## 2017-08-14 DIAGNOSIS — N390 Urinary tract infection, site not specified: Secondary | ICD-10-CM

## 2017-08-14 DIAGNOSIS — R109 Unspecified abdominal pain: Secondary | ICD-10-CM

## 2017-08-14 LAB — BASIC METABOLIC PANEL
Anion gap: 6 (ref 5–15)
BUN: 12 mg/dL (ref 6–20)
CALCIUM: 8.7 mg/dL — AB (ref 8.9–10.3)
CHLORIDE: 106 mmol/L (ref 101–111)
CO2: 26 mmol/L (ref 22–32)
CREATININE: 1.18 mg/dL — AB (ref 0.44–1.00)
GFR calc non Af Amer: 52 mL/min — ABNORMAL LOW (ref >60)
Glucose, Bld: 94 mg/dL (ref 65–99)
Potassium: 4 mmol/L (ref 3.5–5.1)
SODIUM: 138 mmol/L (ref 135–145)

## 2017-08-14 LAB — CBC
HCT: 27 % — ABNORMAL LOW (ref 35.0–47.0)
HEMOGLOBIN: 9 g/dL — AB (ref 12.0–16.0)
MCH: 27 pg (ref 26.0–34.0)
MCHC: 33.4 g/dL (ref 32.0–36.0)
MCV: 80.9 fL (ref 80.0–100.0)
Platelets: 242 10*3/uL (ref 150–440)
RBC: 3.33 MIL/uL — ABNORMAL LOW (ref 3.80–5.20)
RDW: 16.4 % — AB (ref 11.5–14.5)
WBC: 5.4 10*3/uL (ref 3.6–11.0)

## 2017-08-14 LAB — GLUCOSE, CAPILLARY: GLUCOSE-CAPILLARY: 83 mg/dL (ref 65–99)

## 2017-08-14 MED ORDER — POLYETHYLENE GLYCOL 3350 17 G PO PACK
17.0000 g | PACK | Freq: Every day | ORAL | Status: DC
  Administered 2017-08-14: 17 g via ORAL
  Filled 2017-08-14: qty 1

## 2017-08-14 MED ORDER — SENNOSIDES-DOCUSATE SODIUM 8.6-50 MG PO TABS
2.0000 | ORAL_TABLET | Freq: Two times a day (BID) | ORAL | Status: DC
  Administered 2017-08-14 – 2017-08-15 (×3): 2 via ORAL
  Filled 2017-08-14 (×3): qty 2

## 2017-08-14 MED ORDER — ENOXAPARIN SODIUM 40 MG/0.4ML ~~LOC~~ SOLN: 40.0000 mg | Freq: Two times a day (BID) | SUBCUTANEOUS | Status: DC

## 2017-08-14 NOTE — Progress Notes (Signed)
[place] at [place] NAME: Jeniece Hannis    MR#:  [id number]  DATE OF BIRTH:  06-Oct-1966  SUBJECTIVE:  CHIEF COMPLAINT:   Chief Complaint  Patient presents with  . Abdominal Pain  . Back Pain  Continues to have right lower quadrant pain.  No new issues REVIEW OF SYSTEMS:  Review of Systems  Constitutional: Negative for chills, fever and weight loss.  HENT: Negative for nosebleeds and sore throat.   Eyes: Negative for blurred vision.  Respiratory: Negative for cough, shortness of breath and wheezing.   Cardiovascular: Negative for chest pain, orthopnea, leg swelling and PND.  Gastrointestinal: Positive for abdominal pain. Negative for constipation, diarrhea, heartburn, nausea and vomiting.  Genitourinary: Positive for flank pain. Negative for dysuria and urgency.  Musculoskeletal: Negative for back pain.  Skin: Negative for rash.  Neurological: Negative for dizziness, speech change, focal weakness and headaches.  Endo/Heme/Allergies: Does not bruise/bleed easily.  Psychiatric/Behavioral: Negative for depression.   DRUG ALLERGIES:  No Known Allergies VITALS:  Blood pressure 124/74, pulse 89, temperature 98.3 F (36.8 C), temperature source Oral, resp. rate 20, height 5\' 5"  (1.651 m), weight 115.8 kg (255 lb 3.2 oz), last menstrual period 02/28/2017, SpO2 100 %. PHYSICAL EXAMINATION:  Physical Exam  Constitutional: She is oriented to person, place, and time and well-developed, well-nourished, and in no distress.  HENT:  Head: Normocephalic and atraumatic.  Eyes: Pupils are equal, round, and reactive to light. Conjunctivae and EOM are normal.  Neck: Normal range of motion. Neck supple. No tracheal deviation present. No thyromegaly present.  Cardiovascular: Normal rate, regular rhythm and normal heart sounds.   Pulmonary/Chest: Effort normal and breath sounds normal. No respiratory distress. She has no wheezes. She exhibits no  tenderness.  Abdominal: Soft. Bowel sounds are normal. She exhibits no distension. There is tenderness in the right lower quadrant.  Musculoskeletal: Normal range of motion.  Neurological: She is alert and oriented to person, place, and time. No cranial nerve deficit.  Skin: Skin is warm and dry. No rash noted.  Psychiatric: Mood and affect normal.   LABORATORY PANEL:  Female CBC  Recent Labs Lab 08/14/17 0400  WBC 5.4  HGB 9.0*  HCT 27.0*  PLT 242   ------------------------------------------------------------------------------------------------------------------ Chemistries   Recent Labs Lab 08/12/17 0323  08/14/17 0400  NA 139  < > 138  K 3.7  < > 4.0  CL 105  < > 106  CO2 26  < > 26  GLUCOSE 109*  < > 94  BUN 23*  < > 12  CREATININE 1.49*  < > 1.18*  CALCIUM 9.3  < > 8.7*  AST 16  --   --   ALT 10*  --   --   ALKPHOS 72  --   --   BILITOT 0.5  --   --   < > = values in this interval not displayed. RADIOLOGY:  No results found. ASSESSMENT AND PLAN:  51 year old female with known history of cervical cancer with a tumor that is impinging upon her ureter or kidney on the right side. She presented for evaluation of severe acute on chronic pain in her right flank  *Acute pyelonephritis -Urine culture is growing 100,000 colonies of yeast -discontinueD Rocephin and started her on oral fluconazole for 2/3 days (started on 08/13/2017) -Unsure of her right lower quadrant pain -case discussed with surgical list and urology who will evaluate her. -  CT abdomen was negative for any acute pathology on admission  *Acute on chronic kidney disease stage III -Continue fluids and avoid nephrotoxic medications -Close to her baseline  *Anxiety -Continue Xanax  *History of cervical cancer -Outpatient follow-up at [facility] the records are reviewed and case discussed with Care Management/Social Worker. Management plans discussed with the patient, nursing and they  are in agreement.  CODE STATUS: Full Code  TOTAL TIME TAKING CARE OF THIS PATIENT: 35 minutes.   More than 50% of the time was spent in counseling/coordination of care: YES  POSSIBLE D/C IN 1 DAYS, DEPENDING ON CLINICAL CONDITION.   Max Sane M.D on 08/14/2017 at 2:13 PM  Between 7am to 6pm - Pager - [phone]  After 6pm go to [email or web address] - Proofreader  [facility]  Office  [phone]  CC: Primary care physician; Patient, No Pcp Per  Note: This dictation was prepared with Dragon dictation along with smaller phrase technology. Any transcriptional errors that result from this process are unintentional.

## 2017-08-14 NOTE — Progress Notes (Signed)
08/14/17  Patricia Rojas is a 51 year old female well known to me with a history of cervical cancer, right hydronephrosis secondary to ureteral obstruction from her malignancy managed with a right nephrostomy tube. She is due for exchange on Friday of her tube after failed clamp trial.  She is readmitted 2 days ago with right lower quadrant pain which is quite severe. She did have a noncontrast CT scan which showed the nephrostomy tube in adequate position, a coil in the renal pelvis without any other clearly identify pathology other then her known cervical mass with invasion of the paracervical fat including the right distal ureter.  On exam today she is pleasant, alert and oriented. She does have tenderness in her right lower quadrant with voluntary guarding, no rebound. Right nephrostomy tube in place during clear yellow urine.  Urine culture from her nephrostomy growing yeast consistent with colonization of her tube. No fevers or chills. No leukocytosis.  CT scan from 08/12/17 reviewed personally.    Assessment/plan: Right lower quadrant pain of unclear etiology, may be related to her cervical cancer s/p XRT.    Nephrostomy tube appears to be in good position and not likely the etiology of her discomfort.  Plan for nephrostomy tube exchange on Fridays an outpatient as previously discussed.  Agree with treatment of her yeast colonization in setting of upcoming nephrostomy tube exchange.

## 2017-08-14 NOTE — Consult Note (Signed)
Date of Consultation:  08/14/2017  Requesting Physician:  Max Sane, MD  Reason for Consultation:  Right lower quadrant pain  History of Present Illness: Patricia Rojas is a 51 y.o. female with a history of cervical cancer, s/p chemotherapy and radiation, followed at [facility].  She was admitted on 9/2 with worsening right lower quadrant pain.  Patient reports that she has had chronic right lower quadrant pain since March 2018 that she was diagnosed, but since Saturday, the pain had acutely worsened.  Before, pain medication would help but now it has been uncontrolled.  She has a right nephrostomy tube in place since 3/16 due to hydroureteronephrosis from her cancer, and has had UTIs in the past and is on chronic macrobid for this.  On admission, she had a urine culture done which has only grown yeast, and is currently on fluconazole only.  She has also been constipated and believes her last BM was prior to Saturday.  Denies any fever or chills.  Denies any chest pain or shortness of breath.  Reports some nausea but no emesis.    On admission, she had a CT scan without contrast on 9/2 which did not show any acute pathology.  Past Medical History: Past Medical History:  Diagnosis Date  . Cancer (HCC)    cervical  . Constipation   . Hemorrhoids   . N&V (nausea and vomiting)   . PONV (postoperative nausea and vomiting)      Past Surgical History: Past Surgical History:  Procedure Laterality Date  . CESAREAN SECTION  1986  . CHOLECYSTECTOMY    . HERNIA REPAIR    . IR GENERIC HISTORICAL  02/23/2017   IR NEPHROSTOMY PLACEMENT RIGHT 02/23/2017 [name], MD [facility]-INTERV RAD  . IR NEPHROSTOMY EXCHANGE RIGHT  04/18/2017  . IR NEPHROSTOMY EXCHANGE RIGHT  06/08/2017    Home Medications: Prior to Admission medications   Medication Sig Start Date End Date Taking? Authorizing Provider  ALPRAZolam Duanne Moron) 0.25 MG tablet Take 1-2 tabs daily as needed 02/22/17  Yes Finnegan, Kathlene November, MD   amLODipine (NORVASC) 2.5 MG tablet Take 1 tablet (2.5 mg total) by mouth daily. 04/02/17  Yes Sudini, Alveta Heimlich, MD  nitrofurantoin, macrocrystal-monohydrate, (MACROBID) 100 MG capsule Take 100 mg by mouth daily. 08/01/17  Yes [provider]  oxyCODONE (OXY IR/ROXICODONE) 5 MG immediate release tablet Take 5 mg by mouth every 6 (six) hours as needed for severe pain.  07/25/17 08/24/17 Yes [provider]  prochlorperazine (COMPAZINE) 10 MG tablet Take 10 mg by mouth every 6 (six) hours as needed for nausea or vomiting.   Yes [provider]  promethazine (PHENERGAN) 25 MG suppository Place 25 mg rectally every 6 (six) hours as needed for nausea or vomiting.   Yes [provider]    Allergies: No Known Allergies  Social History:  reports that she quit smoking about 28 years ago. Her smoking use included Cigarettes. She has never used smokeless tobacco. She reports that she does not drink alcohol or use drugs.   Family History: Family History  Problem Relation Age of Onset  . Cancer Maternal Uncle   . Cancer Paternal Aunt   . Kidney cancer Neg Hx   . Bladder Cancer Neg Hx     Review of Systems: Review of Systems  Constitutional: Negative for chills and fever.  HENT: Negative for hearing loss.   Eyes: Negative for blurred vision.  Respiratory: Negative for shortness of breath.   Cardiovascular: Negative for chest  pain.  Gastrointestinal: Positive for abdominal pain, constipation and nausea.  Genitourinary: Negative for dysuria.  Musculoskeletal: Positive for back pain.  Skin: Negative for rash.  Neurological: Negative for dizziness.  Psychiatric/Behavioral: Negative for depression.  All other systems reviewed and are negative.   Physical Exam BP 124/74   Pulse 89   Temp 98.3 F (36.8 C) (Oral)   Resp 20   Ht 5\' 5"  (1.651 m)   Wt 115.8 kg (255 lb 3.2 oz)   LMP 02/28/2017 (Approximate)   SpO2 100%   BMI 42.47 kg/m  CONSTITUTIONAL: No acute  distress HEENT:  Normocephalic, atraumatic, extraocular motion intact. NECK: Trachea is midline, and there is no jugular venous distension.  RESPIRATORY:  Lungs are clear, and breath sounds are equal bilaterally. Normal respiratory effort without pathologic use of accessory muscles. CARDIOVASCULAR: Heart is regular without murmurs, gallops, or rubs. GI: The abdomen is soft, obese, non-distended, with tenderness to palpation in the right lower quadrant at the most significant level.  There is some tenderness in the low abdomen and left lower quadrant, but this is very mild.  The patient also has a small umbilical hernia that is chronic in nature and non-reducible, with some tenderness on deep palpation.  GU:  Right nephrostomy tube with yellow urine. MUSCULOSKELETAL:  Normal muscle strength and tone in all four extremities.  No peripheral edema or cyanosis. SKIN: Skin turgor is normal. There are no pathologic skin lesions.  NEUROLOGIC:  Motor and sensation is grossly normal.  Cranial nerves are grossly intact. PSYCH:  Alert and oriented to person, place and time. Affect is normal.  Laboratory Analysis: Results for orders placed or performed during the hospital encounter of 08/12/17 (from the past 24 hour(s))  CBC     Status: Abnormal   Collection Time: 08/14/17  4:00 AM  Result Value Ref Range   WBC 5.4 3.6 - 11.0 K/uL   RBC 3.33 (L) 3.80 - 5.20 MIL/uL   Hemoglobin 9.0 (L) 12.0 - 16.0 g/dL   HCT 27.0 (L) 35.0 - 47.0 %   MCV 80.9 80.0 - 100.0 fL   MCH 27.0 26.0 - 34.0 pg   MCHC 33.4 32.0 - 36.0 g/dL   RDW 16.4 (H) 11.5 - 14.5 %   Platelets 242 150 - 440 K/uL  Basic metabolic panel     Status: Abnormal   Collection Time: 08/14/17  4:00 AM  Result Value Ref Range   Sodium 138 135 - 145 mmol/L   Potassium 4.0 3.5 - 5.1 mmol/L   Chloride 106 101 - 111 mmol/L   CO2 26 22 - 32 mmol/L   Glucose, Bld 94 65 - 99 mg/dL   BUN 12 6 - 20 mg/dL   Creatinine, Ser 1.18 (H) 0.44 - 1.00 mg/dL    Calcium 8.7 (L) 8.9 - 10.3 mg/dL   GFR calc non Af Amer 52 (L) >60 mL/min   GFR calc Af Amer >60 >60 mL/min   Anion gap 6 5 - 15  Glucose, capillary     Status: None   Collection Time: 08/14/17  7:39 AM  Result Value Ref Range   Glucose-Capillary 83 65 - 99 mg/dL    Imaging: CT scan 9/2: IMPRESSION: 1. No acute process identified. 2. Stable cervical mass eccentric to the right with infiltration of paracervical fat inclusive of the right distal ureter. 3. Sigmoid diverticulosis without evidence for acute diverticulitis. 4. Stable position of right nephrostomy catheter coiling in the renal pelvis. 5. Mild aortic atherosclerosis.  Assessment and Plan: This is a 51 y.o. female who presents with acute on chronic right lower quadrant pain.  I have independently viewed the patient's imaging study and reviewed her laboratory studies.  There is no acute pathology in the right lower quadrant.  Her appendix is normal, there is no bowel obstruction.  There is some stool burden in the colon as well as diverticulosis, but there is no stranding or inflammation to suggest diverticulitis.  At this point, the source of her abdominal pain is unclear, but does not appear to have an acute surgical issue.  She may have chronic pain as a result of radiation from her cancer, or may have constipation pain as she has not had bowel movements in a few days and is correlating with her pain as well.  Will add Miralax to her bowel regimen.    Face-to-face time spent with the patient and care providers was 55 minutes, with more than 50% of the time spent counseling, educating, and coordinating care of the patient.     Melvyn Neth, [place]

## 2017-08-15 LAB — BASIC METABOLIC PANEL
Anion gap: 6 (ref 5–15)
BUN: 11 mg/dL (ref 6–20)
CHLORIDE: 106 mmol/L (ref 101–111)
CO2: 26 mmol/L (ref 22–32)
Calcium: 8.9 mg/dL (ref 8.9–10.3)
Creatinine, Ser: 1.14 mg/dL — ABNORMAL HIGH (ref 0.44–1.00)
GFR calc non Af Amer: 55 mL/min — ABNORMAL LOW (ref >60)
Glucose, Bld: 87 mg/dL (ref 65–99)
POTASSIUM: 4 mmol/L (ref 3.5–5.1)
SODIUM: 138 mmol/L (ref 135–145)

## 2017-08-15 LAB — CBC
HCT: 27 % — ABNORMAL LOW (ref 35.0–47.0)
HEMOGLOBIN: 9.1 g/dL — AB (ref 12.0–16.0)
MCH: 27.6 pg (ref 26.0–34.0)
MCHC: 33.8 g/dL (ref 32.0–36.0)
MCV: 81.7 fL (ref 80.0–100.0)
Platelets: 232 10*3/uL (ref 150–440)
RBC: 3.3 MIL/uL — ABNORMAL LOW (ref 3.80–5.20)
RDW: 16.3 % — ABNORMAL HIGH (ref 11.5–14.5)
WBC: 4.1 10*3/uL (ref 3.6–11.0)

## 2017-08-15 LAB — GLUCOSE, CAPILLARY: GLUCOSE-CAPILLARY: 89 mg/dL (ref 65–99)

## 2017-08-15 MED ORDER — BISACODYL 5 MG PO TBEC
10.0000 mg | DELAYED_RELEASE_TABLET | ORAL | Status: AC
  Administered 2017-08-15: 13:00:00 5 mg via ORAL
  Filled 2017-08-15: qty 2

## 2017-08-15 MED ORDER — SENNOSIDES-DOCUSATE SODIUM 8.6-50 MG PO TABS: 2.0000 | ORAL_TABLET | ORAL | Status: DC

## 2017-08-15 MED ORDER — AMLODIPINE BESYLATE 2.5 MG PO TABS: 2.5000 mg | ORAL_TABLET | Freq: Every day | ORAL | 0 refills | Status: DC

## 2017-08-15 MED ORDER — POLYETHYLENE GLYCOL 3350 17 G PO PACK: 17.0000 g | PACK | ORAL | Status: DC

## 2017-08-15 NOTE — Discharge Instructions (Signed)

## 2017-08-15 NOTE — Plan of Care (Signed)
Pt d/ced home.  Continues to have lower abdominal pain.  Patient hasn't had BM since 9/1.  Gave 10 mg bisacodyl and sennekot.  IVs removed. D/c instructions and f/u appts reviewed.   Pt will have nephrostomy tube replaced on 9/7.  She will transport home with husband.

## 2017-08-16 ENCOUNTER — Other Ambulatory Visit: Payer: Self-pay | Admitting: General Surgery

## 2017-08-16 ENCOUNTER — Telehealth: Payer: Self-pay | Admitting: *Deleted

## 2017-08-16 MED ORDER — PROCHLORPERAZINE MALEATE 10 MG PO TABS: 10.0000 mg | ORAL_TABLET | Freq: Four times a day (QID) | ORAL | 1 refills | Status: DC | PRN

## 2017-08-16 MED ORDER — TRAMADOL HCL 50 MG PO TABS: 50.0000 mg | ORAL_TABLET | Freq: Four times a day (QID) | ORAL | 1 refills | Status: DC | PRN

## 2017-08-16 MED ORDER — PROMETHAZINE HCL 25 MG RE SUPP: 25.0000 mg | Freq: Four times a day (QID) | RECTAL | 1 refills | Status: DC | PRN

## 2017-08-16 NOTE — Telephone Encounter (Signed)
Patient informed of new medicine orders and requests that they be sent to [facility]

## 2017-08-16 NOTE — Telephone Encounter (Signed)
Discharged from hospital yesterday and is still having pain that Oxycodone is not relieving, plus it makes her nauseated. She is taking 3 Oxycodone 5 mg tabs a day Discussed with Dr Grayland Ormond, Tramadol 50 mg and to refill phenergan

## 2017-08-17 ENCOUNTER — Ambulatory Visit
Admission: RE | Admit: 2017-08-17 | Discharge: 2017-08-17 | Disposition: A | Discharge status: Home / Self Care | Payer: Medicaid Other | Source: Ambulatory Visit | Attending: Urology | Admitting: Urology

## 2017-08-17 ENCOUNTER — Ambulatory Visit: Payer: MEDICAID

## 2017-08-17 DIAGNOSIS — Z436 Encounter for attention to other artificial openings of urinary tract: Secondary | ICD-10-CM | POA: Insufficient documentation

## 2017-08-17 DIAGNOSIS — Z8541 Personal history of malignant neoplasm of cervix uteri: Secondary | ICD-10-CM | POA: Diagnosis not present

## 2017-08-17 DIAGNOSIS — N1330 Unspecified hydronephrosis: Secondary | ICD-10-CM | POA: Insufficient documentation

## 2017-08-17 DIAGNOSIS — Z87891 Personal history of nicotine dependence: Secondary | ICD-10-CM | POA: Diagnosis not present

## 2017-08-17 HISTORY — PX: IR NEPHROSTOMY EXCHANGE RIGHT: IMG6070

## 2017-08-17 LAB — CBC
HCT: 29.8 % — ABNORMAL LOW (ref 35.0–47.0)
HEMOGLOBIN: 10.2 g/dL — AB (ref 12.0–16.0)
MCH: 27.4 pg (ref 26.0–34.0)
MCHC: 34.4 g/dL (ref 32.0–36.0)
MCV: 79.8 fL — ABNORMAL LOW (ref 80.0–100.0)
PLATELETS: 309 10*3/uL (ref 150–440)
RBC: 3.74 MIL/uL — AB (ref 3.80–5.20)
RDW: 16.4 % — ABNORMAL HIGH (ref 11.5–14.5)
WBC: 5.4 10*3/uL (ref 3.6–11.0)

## 2017-08-17 LAB — PROTIME-INR
INR: 1.06
PROTHROMBIN TIME: 13.7 s (ref 11.4–15.2)

## 2017-08-17 MED ORDER — CIPROFLOXACIN IN D5W 400 MG/200ML IV SOLN
400.0000 mg | INTRAVENOUS | Status: AC
  Administered 2017-08-17: 400 mg via INTRAVENOUS

## 2017-08-17 MED ORDER — IOPAMIDOL (ISOVUE-300) INJECTION 61%: 10.0000 mL | Freq: Once | INTRAVENOUS | Status: DC | PRN

## 2017-08-17 MED ORDER — MIDAZOLAM HCL 5 MG/5ML IJ SOLN
INTRAMUSCULAR | Status: AC
  Filled 2017-08-17: qty 5

## 2017-08-17 MED ORDER — FENTANYL CITRATE (PF) 100 MCG/2ML IJ SOLN
INTRAMUSCULAR | Status: AC | PRN
  Administered 2017-08-17: 50 ug via INTRAVENOUS

## 2017-08-17 MED ORDER — LIDOCAINE HCL (PF) 1 % IJ SOLN
INTRAMUSCULAR | Status: AC | PRN
  Administered 2017-08-17: 5 mL

## 2017-08-17 MED ORDER — LIDOCAINE HCL (PF) 1 % IJ SOLN
INTRAMUSCULAR | Status: AC
  Filled 2017-08-17: qty 30

## 2017-08-17 MED ORDER — CIPROFLOXACIN IN D5W 400 MG/200ML IV SOLN
INTRAVENOUS | Status: AC
  Filled 2017-08-17: qty 200

## 2017-08-17 MED ORDER — FENTANYL CITRATE (PF) 100 MCG/2ML IJ SOLN
INTRAMUSCULAR | Status: AC
  Filled 2017-08-17: qty 4

## 2017-08-17 MED ORDER — SODIUM CHLORIDE 0.9 % IV SOLN
INTRAVENOUS | Status: DC
  Administered 2017-08-17: 08:00:00 via INTRAVENOUS

## 2017-08-17 MED ORDER — MIDAZOLAM HCL 5 MG/5ML IJ SOLN
INTRAMUSCULAR | Status: AC | PRN
  Administered 2017-08-17: 1 mg via INTRAVENOUS

## 2017-08-17 NOTE — Progress Notes (Signed)
Telephone order by Dr. Angela Adam Nephrostomy tube exchange only-no stent placement.

## 2017-08-17 NOTE — Progress Notes (Signed)
Patient ID: Patricia Rojas, female   DOB: [birth year], 51 y.o.   MRN: [id number]      Chief Complaint: Ureteral obstruction  Referring Physician(s): Brandon,Ashley  Patient Status: [facility] - Out-pt  History of Present Illness: MALGORZATA Rojas is a 51 y.o. female with history of cervical cancer, post right sided nephrostomy catheter placement on 02/23/2017 for obstructive right-sided uropathy. Patient has undergone 2 subsequent nephrostomy catheter exchanges, most recently on 06/08/2017.  Patient was initially scheduled for attempted conversion of nephrostomy catheter to a right-sided double-J ureteral stent, however was recently admitted with a urinary tract infection and as such, request has been changed to a routine nephrostomy catheter exchange.  Given patient's previous discomfort during routine nephrostomy catheter exchange, the patient again requests the exchange be performed with conscious sedation.  Patient is currently without complaint. No flank pain. No fever or chills. No chest pain or shortness of breath.  Past Medical History:  Diagnosis Date  . Cancer (HCC)    cervical  . Constipation   . Hemorrhoids   . N&V (nausea and vomiting)   . PONV (postoperative nausea and vomiting)     Past Surgical History:  Procedure Laterality Date  . CESAREAN SECTION  1986  . CHOLECYSTECTOMY    . HERNIA REPAIR    . IR GENERIC HISTORICAL  02/23/2017   IR NEPHROSTOMY PLACEMENT RIGHT 02/23/2017 [name], MD [facility]-INTERV RAD  . IR NEPHROSTOMY EXCHANGE RIGHT  04/18/2017  . IR NEPHROSTOMY EXCHANGE RIGHT  06/08/2017    Allergies: Patient has no known allergies.  Medications: Prior to Admission medications   Medication Sig Start Date End Date Taking? Authorizing Provider  amLODipine (NORVASC) 2.5 MG tablet Take 1 tablet (2.5 mg total) by mouth daily. 08/16/17  Yes Max Sane, MD  nitrofurantoin, macrocrystal-monohydrate, (MACROBID) 100 MG capsule Take 100 mg by mouth daily. 08/01/17  Yes [provider]  prochlorperazine (COMPAZINE) 10 MG tablet Take 1 tablet (10 mg total) by mouth every 6 (six) hours as needed for nausea or vomiting. 08/16/17  Yes Lloyd Huger, MD  promethazine (PHENERGAN) 25 MG suppository Place 1 suppository (25 mg total) rectally every 6 (six) hours as needed for nausea or vomiting. 08/16/17  Yes Lloyd Huger, MD  traMADol (ULTRAM) 50 MG tablet Take 1 tablet (50 mg total) by mouth every 6 (six) hours as needed. 08/16/17  Yes Lloyd Huger, MD  ALPRAZolam Duanne Moron) 0.25 MG tablet Take 1-2 tabs daily as needed 02/22/17   Lloyd Huger, MD     Family History  Problem Relation Age of Onset  . Cancer Maternal Uncle   . Cancer Paternal Aunt   . Kidney cancer Neg Hx   . Bladder Cancer Neg Hx     Social History   Social History  . Marital status: Single    Spouse name: N/A  . Number of children: N/A  . Years of education: N/A   Social History Main Topics  . Smoking status: Former Smoker    Types: Cigarettes    Quit date: 73  . Smokeless tobacco: Never Used  . Alcohol use No  . Drug use: No     Comment: stopped 3 weeks ago  . Sexual activity: Not Asked   Other Topics Concern  . None   Social History Narrative  . None    ECOG Status: 1 - Symptomatic but completely ambulatory  Review of Systems: A 12 point ROS discussed and pertinent positives are indicated in the HPI above.  All other systems are negative.  Review of Systems  Constitutional: Negative for fever.  Respiratory: Negative for shortness of breath.   Cardiovascular: Negative for chest pain.  Genitourinary: Negative for flank pain.    Vital Signs: BP (!) 144/85   Pulse 75   Temp 99.1 F (37.3 C) (Oral)   Resp 13   LMP 02/28/2017 (Approximate)   SpO2 99%   Physical Exam  Constitutional: She appears well-developed and well-nourished.  HENT:  Head: Normocephalic and atraumatic.  Cardiovascular: Normal rate and regular rhythm.   Pulmonary/Chest:  Effort normal and breath sounds normal.  Psychiatric: She has a normal mood and affect. Her behavior is normal.  Nursing note and vitals reviewed.   Imaging:  Nephrostomy catheter exchange - 06/08/2017  Ct Renal Stone Study  Result Date: 08/12/2017 CLINICAL DATA:  51 y/o F; right lower quadrant and right lower back pain. History of cervical cancer. EXAM: CT ABDOMEN AND PELVIS WITHOUT CONTRAST TECHNIQUE: Multidetector CT imaging of the abdomen and pelvis was performed following the standard protocol without IV contrast. COMPARISON:  06/27/2017 CT abdomen and pelvis FINDINGS: Lower chest: No acute abnormality. Hepatobiliary: No focal liver abnormality is seen. Status post cholecystectomy. No biliary dilatation. Pancreas: Unremarkable. No pancreatic ductal dilatation or surrounding inflammatory changes. Spleen: Normal in size without focal abnormality. Adrenals/Urinary Tract: Right-sided nephrostomy tube is unchanged in position correlating in the right renal pelvis. Decompressed right renal collecting system. No focal kidney lesion identified. Normal appearance of the bladder. Stomach/Bowel: Stomach is within normal limits. Appendix appears normal. No evidence of bowel wall thickening, distention, or inflammatory changes. Stable sigmoid diverticulosis. Vascular/Lymphatic: Aortic atherosclerosis. No enlarged abdominal or pelvic lymph nodes. Reproductive: Unchanged cervical mass eccentric to the right with paracervical fat infiltration inclusive of the right distal ureter. Normal noncontrast appearance of the uterus and adnexa Other: Small paraumbilical hernia containing fat. Musculoskeletal: No fracture is seen. IMPRESSION: 1. No acute process identified. 2. Stable cervical mass eccentric to the right with infiltration of paracervical fat inclusive of the right distal ureter. 3. Sigmoid diverticulosis without evidence for acute diverticulitis. 4. Stable position of right nephrostomy catheter coiling in the  renal pelvis. 5. Mild aortic atherosclerosis. Electronically Signed   By: Kristine Garbe M.D.   On: 08/12/2017 05:32    Labs:  CBC:  Recent Labs  08/13/17 0317 08/14/17 0400 08/15/17 0444 08/17/17 0705  WBC 5.8 5.4 4.1 5.4  HGB 9.5* 9.0* 9.1* 10.2*  HCT 28.2* 27.0* 27.0* 29.8*  PLT 261 242 232 309    COAGS:  Recent Labs  02/23/17 0719 06/10/17 0911 08/17/17 0705  INR 0.99 1.16 1.06  APTT 25  --   --     BMP:  Recent Labs  08/12/17 0323 08/13/17 0317 08/14/17 0400 08/15/17 0444  NA 139 139 138 138  K 3.7 4.0 4.0 4.0  CL 105 108 106 106  CO2 26 27 26 26   GLUCOSE 109* 103* 94 87  BUN 23* 16 12 11   CALCIUM 9.3 8.7* 8.7* 8.9  CREATININE 1.49* 1.31* 1.18* 1.14*  GFRNONAA 40* 46* 52* 55*  GFRAA 46* 54* >60 >60    LIVER FUNCTION TESTS:  Recent Labs  03/31/17 2208 06/10/17 0911 06/27/17 0718 08/12/17 0323  BILITOT 0.4 0.6 0.4 0.5  AST 20 15 18 16   ALT 14 6* 9* 10*  ALKPHOS 88 75 70 72  PROT 7.0 8.7* 8.5* 8.5*  ALBUMIN 3.4* 3.1* 3.2* 3.7    Assessment and Plan:  Patricia Rojas is a  51 y.o. female with history of cervical cancer, post right sided nephrostomy catheter placement on 02/23/2017 for obstructive right-sided uropathy. Patient was initially scheduled for attempted conversion of nephrostomy catheter to a right-sided double-J ureteral stent, however was recently admitted with a urinary tract infection and as such, request has been changed to a routine nephrostomy catheter exchange.  Risks and benefits of PCN exchange with sedation were discussed with the patient including, but not limited to infection, bleeding and loss of access requiring new placement.  All of the patient's questions were answered, patient is agreeable to proceed.  Consent signed and in chart.   A copy of this report was sent to the requesting provider on this date.  Electronically Signed: Sandi Mariscal, MD 08/17/2017, 7:49 AM   I spent a total of 15 Minutes in face  to face in clinical consultation, greater than 50% of which was counseling/coordinating care for nephrostomy exchange.

## 2017-08-17 NOTE — Procedures (Signed)
Pre Procedure Dx: Hydronephrosis Post Procedure Dx: Same  Successful right sided PCN exchange.    EBL: None   No immediate complications.   Jay Kiylee Thoreson, MD Pager #: [phone]   

## 2017-08-18 NOTE — Discharge Summary (Signed)
Patricia Rojas at [facility] NAME: Patricia Rojas    MR#:  [id number]  DATE OF BIRTH:  [birth year]  DATE OF ADMISSION:  08/12/2017   ADMITTING PHYSICIAN: Max Sane, MD  DATE OF DISCHARGE: 08/15/2017  3:58 PM  PRIMARY CARE PHYSICIAN: [name], MD   ADMISSION DIAGNOSIS:  Right flank pain [R10.9] Intractable pain [R52] Urinary tract infection associated with nephrostomy catheter, initial encounter ([place]) [H63.149F, N39.0] DISCHARGE DIAGNOSIS:  Active Problems:   UTI (urinary tract infection)   Intractable pain  SECONDARY DIAGNOSIS:   Past Medical History:  Diagnosis Date  . Cancer (HCC)    cervical  . Constipation   . Hemorrhoids   . N&V (nausea and vomiting)   . PONV (postoperative nausea and vomiting)    HOSPITAL COURSE:  51 year old female with known history ofcervical cancer with a tumor that is impinging upon her ureter or kidney on the right side. She was admitted for evaluation of severe acute on chronic pain in her right flank  *Acute pyelonephritis -Urine culture is growing 100,000 colonies of yeast -treated with oral fluconazole for 3 days  -her pain could be due to underlying cervical cancer - She is due to get her nephrostomy tube exchanged on 09/07  *Acute on chronic kidney disease stage III -back to her baseline. Hydrated with IVFs  *History of cervical cancer -Outpatient follow-up at [place]:  stable CONSULTS OBTAINED:  Treatment Team:  Hollice Espy, MD DRUG ALLERGIES:  No Known Allergies DISCHARGE MEDICATIONS:   Allergies as of 08/15/2017   No Known Allergies     Medication List    TAKE these medications   ALPRAZolam 0.25 MG tablet Commonly known as:  XANAX Take 1-2 tabs daily as needed   amLODipine 2.5 MG tablet Commonly known as:  NORVASC Take 1 tablet (2.5 mg total) by mouth daily.   nitrofurantoin (macrocrystal-monohydrate) 100 MG capsule Commonly known  as:  MACROBID Take 100 mg by mouth daily.            Discharge Care Instructions        Start     Ordered   08/16/17 0000  amLODipine (NORVASC) 2.5 MG tablet  Daily     08/15/17 1549   08/15/17 0000  Increase activity slowly     08/15/17 0916   08/15/17 0000  Diet - low sodium heart healthy     08/15/17 0916       DISCHARGE INSTRUCTIONS:   DIET:  Regular diet DISCHARGE CONDITION:  Good ACTIVITY:  Activity as tolerated OXYGEN:  Home Oxygen: No.  Oxygen Delivery: room air DISCHARGE LOCATION:  home   If you experience worsening of your admission symptoms, develop shortness of breath, life threatening emergency, suicidal or homicidal thoughts you must seek medical attention immediately by calling 911 or calling your MD immediately  if symptoms less severe.  You Must read complete instructions/literature along with all the possible adverse reactions/side effects for all the Medicines you take and that have been prescribed to you. Take any new Medicines after you have completely understood and accpet all the possible adverse reactions/side effects.   Please note  You were cared for by a hospitalist during your hospital stay. If you have any questions about your discharge medications or the care you received while you were in the hospital after you are discharged, you can call the unit and asked to speak with the hospitalist on call if the hospitalist that  took care of you is not available. Once you are discharged, your primary care physician will handle any further medical issues. Please note that NO REFILLS for any discharge medications will be authorized once you are discharged, as it is imperative that you return to your primary care physician (or establish a relationship with a primary care physician if you do not have one) for your aftercare needs so that they can reassess your need for medications and monitor your lab values.    On the day of Discharge:  VITAL SIGNS:    Blood pressure 128/71, pulse 83, temperature 98.4 F (36.9 C), temperature source Oral, resp. rate 18, height 5\' 5"  (1.651 m), weight 115.3 kg (254 lb 1.6 oz), last menstrual period 02/28/2017, SpO2 100 %. PHYSICAL EXAMINATION:  GENERAL:  51 y.o.-year-old patient lying in the bed with no acute distress.  EYES: Pupils equal, round, reactive to light and accommodation. No scleral icterus. Extraocular muscles intact.  HEENT: Head atraumatic, normocephalic. Oropharynx and nasopharynx clear.  NECK:  Supple, no jugular venous distention. No thyroid enlargement, no tenderness.  LUNGS: Normal breath sounds bilaterally, no wheezing, rales,rhonchi or crepitation. No use of accessory muscles of respiration.  CARDIOVASCULAR: S1, S2 normal. No murmurs, rubs, or gallops.  ABDOMEN: Soft, non-tender, non-distended. Bowel sounds present. No organomegaly or mass.  EXTREMITIES: No pedal edema, cyanosis, or clubbing.  NEUROLOGIC: Cranial nerves II through XII are intact. Muscle strength 5/5 in all extremities. Sensation intact. Gait not checked.  PSYCHIATRIC: The patient is alert and oriented x 3.  SKIN: No obvious rash, lesion, or ulcer.  DATA REVIEW:   CBC  Recent Labs Lab 08/17/17 0705  WBC 5.4  HGB 10.2*  HCT 29.8*  PLT 309    Chemistries   Recent Labs Lab 08/12/17 0323  08/15/17 0444  NA 139  < > 138  K 3.7  < > 4.0  CL 105  < > 106  CO2 26  < > 26  GLUCOSE 109*  < > 87  BUN 23*  < > 11  CREATININE 1.49*  < > 1.14*  CALCIUM 9.3  < > 8.9  AST 16  --   --   ALT 10*  --   --   ALKPHOS 72  --   --   BILITOT 0.5  --   --   < > = values in this interval not displayed.   Microbiology Results  Results for orders placed or performed during the hospital encounter of 08/12/17  Urine Culture     Status: Abnormal   Collection Time: 08/12/17  3:23 AM  Result Value Ref Range Status   Specimen Description URINE, RANDOM  Final   Special Requests Normal  Final   Culture >=100,000 COLONIES/mL  YEAST (A)  Final   Report Status 08/13/2017 FINAL  Final    Follow-up Information    Schedule an appointment as soon as possible for a visit with [name], MD.   Specialty:  Infectious Diseases Why:  Dr. Blane Ohara office will call you back to give you the details of you follow up appointments Contact information: [place] [address] [phone]        Hollice Espy, MD. Go on 08/17/2017.   Specialty:  Urology Why:  as scheduled for nephrostomy tube exchange Contact information: [address] [place] [phone]        Shirline Frees, MD. Schedule an appointment as soon as possible for a  visit in 2 weeks.   Specialty:  Radiation Oncology Why:  Please call and make your follow up appointment with Dr. Christel Mormon. Contact information: [address] [place] [address] [phone]           Management plans discussed with the patient, family and they are in agreement.  CODE STATUS: Prior   TOTAL TIME TAKING CARE OF THIS PATIENT: 45 minutes.    Max Sane M.D on 08/18/2017 at 11:11 AM  Between 7am to 6pm - Pager - [phone]  After 6pm go to [email or web address] - Proofreader  [facility]  Office  [phone]  CC: Primary care physician; [name], MD   Note: This dictation was prepared with Dragon dictation along with smaller phrase technology. Any transcriptional errors that result from this process are unintentional.

## 2017-08-23 ENCOUNTER — Encounter: Payer: Self-pay | Admitting: Emergency Medicine

## 2017-08-23 ENCOUNTER — Emergency Department
Admission: EM | Admit: 2017-08-23 | Discharge: 2017-08-23 | Disposition: A | Discharge status: Home / Self Care | Payer: Medicaid Other | Attending: Emergency Medicine | Admitting: Emergency Medicine

## 2017-08-23 DIAGNOSIS — R1031 Right lower quadrant pain: Secondary | ICD-10-CM | POA: Diagnosis present

## 2017-08-23 DIAGNOSIS — Z79899 Other long term (current) drug therapy: Secondary | ICD-10-CM | POA: Diagnosis not present

## 2017-08-23 DIAGNOSIS — R109 Unspecified abdominal pain: Secondary | ICD-10-CM

## 2017-08-23 DIAGNOSIS — Z87891 Personal history of nicotine dependence: Secondary | ICD-10-CM | POA: Diagnosis not present

## 2017-08-23 DIAGNOSIS — G8929 Other chronic pain: Secondary | ICD-10-CM

## 2017-08-23 LAB — URINALYSIS, COMPLETE (UACMP) WITH MICROSCOPIC
BACTERIA UA: NONE SEEN
Bilirubin Urine: NEGATIVE
Glucose, UA: NEGATIVE mg/dL
Ketones, ur: NEGATIVE mg/dL
NITRITE: NEGATIVE
Protein, ur: 100 mg/dL — AB
Specific Gravity, Urine: 1.016 (ref 1.005–1.030)
pH: 5 (ref 5.0–8.0)

## 2017-08-23 LAB — CBC
HEMATOCRIT: 32.9 % — AB (ref 35.0–47.0)
HEMOGLOBIN: 11.2 g/dL — AB (ref 12.0–16.0)
MCH: 27.2 pg (ref 26.0–34.0)
MCHC: 34 g/dL (ref 32.0–36.0)
MCV: 80 fL (ref 80.0–100.0)
Platelets: 414 10*3/uL (ref 150–440)
RBC: 4.11 MIL/uL (ref 3.80–5.20)
RDW: 16.2 % — ABNORMAL HIGH (ref 11.5–14.5)
WBC: 8.1 10*3/uL (ref 3.6–11.0)

## 2017-08-23 LAB — COMPREHENSIVE METABOLIC PANEL
ALBUMIN: 4 g/dL (ref 3.5–5.0)
ALT: 9 U/L — ABNORMAL LOW (ref 14–54)
ANION GAP: 9 (ref 5–15)
AST: 21 U/L (ref 15–41)
Alkaline Phosphatase: 84 U/L (ref 38–126)
BUN: 16 mg/dL (ref 6–20)
CHLORIDE: 101 mmol/L (ref 101–111)
CO2: 26 mmol/L (ref 22–32)
Calcium: 9.5 mg/dL (ref 8.9–10.3)
Creatinine, Ser: 1.4 mg/dL — ABNORMAL HIGH (ref 0.44–1.00)
GFR calc Af Amer: 49 mL/min — ABNORMAL LOW (ref >60)
GFR calc non Af Amer: 43 mL/min — ABNORMAL LOW (ref >60)
GLUCOSE: 102 mg/dL — AB (ref 65–99)
POTASSIUM: 3.6 mmol/L (ref 3.5–5.1)
SODIUM: 136 mmol/L (ref 135–145)
TOTAL PROTEIN: 9.2 g/dL — AB (ref 6.5–8.1)
Total Bilirubin: 0.5 mg/dL (ref 0.3–1.2)

## 2017-08-23 MED ORDER — PROMETHAZINE HCL 25 MG/ML IJ SOLN
INTRAMUSCULAR | Status: AC
  Filled 2017-08-23: qty 1

## 2017-08-23 MED ORDER — PROMETHAZINE HCL 25 MG/ML IJ SOLN
12.5000 mg | Freq: Once | INTRAMUSCULAR | Status: AC
  Administered 2017-08-23: 12.5 mg via INTRAVENOUS

## 2017-08-23 MED ORDER — MORPHINE SULFATE (PF) 4 MG/ML IV SOLN
4.0000 mg | Freq: Once | INTRAVENOUS | Status: AC
  Administered 2017-08-23: 4 mg via INTRAVENOUS
  Filled 2017-08-23: qty 1

## 2017-08-23 MED ORDER — OXYCODONE-ACETAMINOPHEN 5-325 MG PO TABS
2.0000 | ORAL_TABLET | Freq: Once | ORAL | Status: AC
  Administered 2017-08-23: 2 via ORAL
  Filled 2017-08-23: qty 2

## 2017-08-23 NOTE — Discharge Instructions (Signed)
You have been seen in the Emergency Department (ED) for chronic right sided abdominal/flank pain.  Your evaluation did not identify a clear cause of your symptoms but was generally reassuring.  Please follow up as instructed above regarding today?s emergent visit and the symptoms that are bothering you.  As we discussed, we cannot provide narcotics for chronic pain according to the [facility] Chronic Pain policy.  Your urinalysis does show some abnormal cells, but it looks better than it has in the past.  The last time you had yeast in your urine.  I have ordered a urine culture but will defer on any antibiotics until your culture comes back.  Please follow up with your regular doctors (including Dr. Erlene Quan) at your next available opportunity.  Return to the ED if your abdominal pain worsens or fails to improve, you develop bloody vomiting, bloody diarrhea, you are unable to tolerate fluids due to vomiting, fever greater than 101, or other symptoms that concern you.

## 2017-08-23 NOTE — ED Provider Notes (Signed)
[facility] Emergency Department Provider Note  ____________________________________________   First MD Initiated Contact with Patient 08/23/17 2221     (approximate)  I have reviewed the triage vital signs and the nursing notes.   HISTORY  Chief Complaint Abdominal Pain    HPI Patricia Rojas is a 51 y.o. female with medical history as listed below that specifically include cervical cancer with a tumor that chronically impinges upon her kidney and ureter on the right side for which she has a nephrostomy tube.  She suffers from chronic pain in that area.  She presents tonight for evaluation of worsening pain on that side.  She cannot tell me exactly how long it has been hurting because "I hurt all the time".  I looked her up on the [place] controlled substance reporting system database and saw that she was last prescribed 120 pills of oxycodone by a physician at [facility] just under one month ago, and she reports that [name], her oncologist, took her off of the oxycodone and started her on tramadol.  However she reports the tramadol does not help the pain.  She states that she cannot find a position of comfort and this is been going on for at least a week.  She did not try calling any of her doctors over the last couple of days but states that she could not get comfortable tonight so she thought she should come in to be evaluated.  She denies fever/chills, chest pain, shortness of breath, nausea, vomiting, and lower abdominal pain.     Past Medical History:  Diagnosis Date  . Cancer (HCC)    cervical  . Constipation   . Hemorrhoids   . N&V (nausea and vomiting)   . PONV (postoperative nausea and vomiting)     Patient Active Problem List   Diagnosis Date Noted  . Intractable pain   . UTI (urinary tract infection) 08/12/2017  . Pyelonephritis 06/27/2017  . Sepsis ([place]) 06/10/2017  . Intractable nausea and vomiting 04/01/2017  . Goals of care,  counseling/discussion 02/18/2017  . Uterine cancer ([place]) 02/07/2017    Past Surgical History:  Procedure Laterality Date  . CESAREAN SECTION  1986  . CHOLECYSTECTOMY    . HERNIA REPAIR    . IR GENERIC HISTORICAL  02/23/2017   IR NEPHROSTOMY PLACEMENT RIGHT 02/23/2017 [name], MD [facility]-INTERV RAD  . IR NEPHROSTOMY EXCHANGE RIGHT  04/18/2017  . IR NEPHROSTOMY EXCHANGE RIGHT  06/08/2017  . IR NEPHROSTOMY EXCHANGE RIGHT  08/17/2017    Prior to Admission medications   Medication Sig Start Date End Date Taking? Authorizing Provider  ALPRAZolam Duanne Moron) 0.25 MG tablet Take 1-2 tabs daily as needed 02/22/17   Lloyd Huger, MD  amLODipine (NORVASC) 2.5 MG tablet Take 1 tablet (2.5 mg total) by mouth daily. 08/16/17   Max Sane, MD  nitrofurantoin, macrocrystal-monohydrate, (MACROBID) 100 MG capsule Take 100 mg by mouth daily. 08/01/17   [provider]  prochlorperazine (COMPAZINE) 10 MG tablet Take 1 tablet (10 mg total) by mouth every 6 (six) hours as needed for nausea or vomiting. 08/16/17   Lloyd Huger, MD  promethazine (PHENERGAN) 25 MG suppository Place 1 suppository (25 mg total) rectally every 6 (six) hours as needed for nausea or vomiting. 08/16/17   Lloyd Huger, MD  traMADol (ULTRAM) 50 MG tablet Take 1 tablet (50 mg total) by mouth every 6 (six) hours as needed. 08/16/17   Lloyd Huger, MD  Allergies Patient has no known allergies.  Family History  Problem Relation Age of Onset  . Cancer Maternal Uncle   . Cancer Paternal Aunt   . Kidney cancer Neg Hx   . Bladder Cancer Neg Hx     Social History Social History  Substance Use Topics  . Smoking status: Former Smoker    Types: Cigarettes    Quit date: 48  . Smokeless tobacco: Never Used  . Alcohol use No    Review of Systems Constitutional: No fever/chills Eyes: No visual changes. ENT: No sore throat. Cardiovascular: Denies chest pain. Respiratory: Denies shortness of  breath. Gastrointestinal: Chronic right sided flank/abdominal pain.  No nausea, no vomiting.  No diarrhea.  No constipation. Genitourinary: Negative for dysuria. Musculoskeletal: Negative for neck pain.  Negative for back pain. Integumentary: Negative for rash. Neurological: Negative for headaches, focal weakness or numbness.   ____________________________________________   PHYSICAL EXAM:  VITAL SIGNS: ED Triage Vitals  Enc Vitals Group     BP 08/23/17 1943 128/78     Pulse Rate 08/23/17 1943 92     Resp 08/23/17 1943 18     Temp 08/23/17 1943 98.1 F (36.7 C)     Temp Source 08/23/17 1943 Oral     SpO2 08/23/17 1943 98 %     Weight 08/23/17 1942 115.7 kg (255 lb)     Height 08/23/17 1942 1.651 m (5\' 5" )     Head Circumference --      Peak Flow --      Pain Score 08/23/17 1942 10     Pain Loc --      Pain Edu? --      Excl. in [place]? --     Constitutional: Alert and oriented. Well appearing and in no acute distress.  Tearful. Eyes: Conjunctivae are normal.  Head: Atraumatic. Nose: No congestion/rhinnorhea. Mouth/Throat: Mucous membranes are moist. Neck: No stridor.  No meningeal signs.   Cardiovascular: Normal rate, regular rhythm. Good peripheral circulation. Grossly normal heart sounds. Respiratory: Normal respiratory effort.  No retractions. Lungs CTAB. Gastrointestinal: Obese. Soft and nontender. No distention.  Right sided nephrostomy. Musculoskeletal: No lower extremity tenderness nor edema. No gross deformities of extremities. Neurologic:  Normal speech and language. No gross focal neurologic deficits are appreciated.  Skin:  Skin is warm, dry and intact. No rash noted. No cellulitis around nephrostomy. Psychiatric: Mood and affect are slightly anxious and occasionally tearful, otherwise unremarkable. Speech and behavior are normal.  ____________________________________________   LABS (all labs ordered are listed, but only abnormal results are displayed)  Labs  Reviewed  COMPREHENSIVE METABOLIC PANEL - Abnormal; Notable for the following:       Result Value   Glucose, Bld 102 (*)    Creatinine, Ser 1.40 (*)    Total Protein 9.2 (*)    ALT 9 (*)    GFR calc non Af Amer 43 (*)    GFR calc Af Amer 49 (*)    All other components within normal limits  CBC - Abnormal; Notable for the following:    Hemoglobin 11.2 (*)    HCT 32.9 (*)    RDW 16.2 (*)    All other components within normal limits  URINALYSIS, COMPLETE (UACMP) WITH MICROSCOPIC - Abnormal; Notable for the following:    Color, Urine YELLOW (*)    APPearance TURBID (*)    Hgb urine dipstick MODERATE (*)    Protein, ur 100 (*)    Leukocytes, UA LARGE (*)  Squamous Epithelial / LPF 0-5 (*)    All other components within normal limits  URINE CULTURE   ____________________________________________  EKG  None - EKG not ordered by ED physician ____________________________________________  RADIOLOGY   No results found.  ____________________________________________   PROCEDURES  Critical Care performed: No   Procedure(s) performed:   Procedures   ____________________________________________   INITIAL IMPRESSION / ASSESSMENT AND PLAN / ED COURSE  Pertinent labs & imaging results that were available during my care of the patient were reviewed by me and considered in my medical decision making (see chart for details).  Labs are generally unremarkable.  Her creatinine is slightly elevated but it is still within the range that she has been in the past.  Urinalysis looks bad as per usual for nephrostomy, but it actually looks better than it has in the past.  Additional reviewed her prior records and saw that her urine culture last time only grew back Candida.  I am reluctant to treat her empirically given that unnecessary treatment is going to lead to drug resistance and she has multiple outpatient doctors with whom she can follow-up including Dr. Erlene Quan and [name].   She has no tachycardia, no fever, and no leukocytosis, in summary, no evidence of acute infection at this time.  I ordered a urine culture again for the purposes of follow-up.  I explained to the patient that we cannot treat her chronic pain with prescriptions but given her comorbidities I will treat her with morphine in the emergency department and 2 Percocet prior to discharge.  She is also complaining that narcotics make her nauseated and Phenergan works better than Zofran so I will give her one small dose of Phenergan.  I gave my usual and customary return precautions.         ____________________________________________  FINAL CLINICAL IMPRESSION(S) / ED DIAGNOSES  Final diagnoses:  Chronic right flank pain     MEDICATIONS GIVEN DURING THIS VISIT:  Medications  oxyCODONE-acetaminophen (PERCOCET/ROXICET) 5-325 MG per tablet 2 tablet (not administered)  promethazine (PHENERGAN) 25 MG/ML injection (not administered)  morphine 4 MG/ML injection 4 mg (4 mg Intravenous Given 08/23/17 2314)  promethazine (PHENERGAN) injection 12.5 mg (12.5 mg Intravenous Given 08/23/17 2309)     NEW OUTPATIENT MEDICATIONS STARTED DURING THIS VISIT:  New Prescriptions   No medications on file    Modified Medications   No medications on file    Discontinued Medications   No medications on file     Note:  This document was prepared using Dragon voice recognition software and may include unintentional dictation errors.    Hinda Kehr, MD 08/23/17 [phone]

## 2017-08-23 NOTE — ED Triage Notes (Signed)
Patient with complaint of chronic right lower abdominal pain from cervicale cancer. Patient states that this afternoon that the pain became worse. Patient states that she normally takes tramadol for the pain but that it is not helping.

## 2017-08-23 NOTE — ED Notes (Signed)
Pt reports that she is having pain in her lower right abd - this started today - reports N/V (x2 vomited in 24 hours) - denies any pain or freq with urination

## 2017-08-26 LAB — URINE CULTURE
Culture: >=100000 — AB
Special Requests: NORMAL

## 2017-08-27 NOTE — Progress Notes (Signed)
ED Antimicrobial Stewardship Positive Culture Follow Up   Patricia Rojas is an 51 y.o. female who presented to [facility] on 08/23/2017 with a chief complaint of  Chief Complaint  Patient presents with  . Abdominal Pain    Recent Results (from the past 720 hour(s))  Urine Culture     Status: Abnormal   Collection Time: 08/12/17  3:23 AM  Result Value Ref Range Status   Specimen Description URINE, RANDOM  Final   Special Requests Normal  Final   Culture >=100,000 COLONIES/mL YEAST (A)  Final   Report Status 08/13/2017 FINAL  Final  Urine Culture     Status: Abnormal   Collection Time: 08/23/17  7:45 PM  Result Value Ref Range Status   Specimen Description KIDNEY  Final   Special Requests nephrostomy Normal  Final   Culture (A)  Final    >=100,000 COLONIES/mL DIPHTHEROIDS(CORYNEBACTERIUM SPECIES) Standardized susceptibility testing for this organism is not available. Performed at [facility], [place] [address], [place], [place] [address]    Report Status 08/26/2017 FINAL  Final    [x]  Patient discharged originally without antimicrobial agent and treatment is now indicated  New antibiotic prescription: doxycycline 100 mg PO BID x 7 days  ED Provider: Dr. Cephas Darby  Discussed with Dr. Jimmye Norman who asked me to contact Dr. Ola Spurr. Dr. Ola Spurr recommended 7 days of doxycycline, which Dr. Jimmye Norman prescribed. Spoke with patient and called in prescription to [facility] in [place], [place] per patient request.  Lenis Noon, PharmD 08/27/17 4:09 PM

## 2017-08-28 NOTE — Progress Notes (Signed)
[place]  [phone] [phone]) [phone]  ID: Patricia Rojas OB: January 04, 1966  MR#: [id number]  OFB#:[id number]  Patient Care Team: [name], MD as PCP - General (Infectious Diseases) Clent Jacks, RN as Registered Nurse  CHIEF COMPLAINT: Squamous cell carcinoma of the cervix.  INTERVAL HISTORY: Patient returns to clinic today for further evaluation and discussion of her imaging results. Patient recently fell improved, but now has increasing pain in her lower abdomen and pelvic area. She otherwise feels well. She has no neurologic complaints. She denies any recent fevers or illnesses. She has a fair appetite. She has no chest pain or shortness of breath. She denies any constipation or diarrhea. She has no urinary complaints. Patient offers no further specific complaints today.  REVIEW OF SYSTEMS:   Review of Systems  Constitutional: Negative.  Negative for fever, malaise/fatigue and weight loss.  Respiratory: Negative.  Negative for cough and shortness of breath.   Cardiovascular: Negative.  Negative for chest pain and leg swelling.  Gastrointestinal: Negative for abdominal pain, constipation, diarrhea and nausea.  Genitourinary: Positive for flank pain.  Skin: Negative.  Negative for rash.  Neurological: Negative.  Negative for weakness.  Psychiatric/Behavioral: The patient is nervous/anxious.     As per HPI. Otherwise, a complete review of systems is negative.  PAST MEDICAL HISTORY: Past Medical History:  Diagnosis Date  . Cancer (HCC)    cervical  . Constipation   . Hemorrhoids   . N&V (nausea and vomiting)   . PONV (postoperative nausea and vomiting)     PAST SURGICAL HISTORY: Past Surgical History:  Procedure Laterality Date  . CESAREAN SECTION  1986  . CHOLECYSTECTOMY    . HERNIA REPAIR    . IR GENERIC HISTORICAL  02/23/2017   IR NEPHROSTOMY PLACEMENT RIGHT 02/23/2017 [name], MD [facility]-INTERV RAD  . IR NEPHROSTOMY  EXCHANGE RIGHT  04/18/2017  . IR NEPHROSTOMY EXCHANGE RIGHT  06/08/2017  . IR NEPHROSTOMY EXCHANGE RIGHT  08/17/2017    FAMILY HISTORY: Family History  Problem Relation Age of Onset  . Cancer Maternal Uncle   . Cancer Paternal Aunt   . Kidney cancer Neg Hx   . Bladder Cancer Neg Hx     ADVANCED DIRECTIVES (Y/N):  N  HEALTH MAINTENANCE: Social History  Substance Use Topics  . Smoking status: Former Smoker    Types: Cigarettes    Quit date: 48  . Smokeless tobacco: Never Used  . Alcohol use No     Colonoscopy:  PAP:  Bone density:  Lipid panel:  No Known Allergies  Current Outpatient Prescriptions  Medication Sig Dispense Refill  . ALPRAZolam (XANAX) 0.25 MG tablet Take 1-2 tabs daily as needed 60 tablet 0  . amLODipine (NORVASC) 2.5 MG tablet Take 1 tablet (2.5 mg total) by mouth daily. 30 tablet 0  . prochlorperazine (COMPAZINE) 10 MG tablet Take 1 tablet (10 mg total) by mouth every 6 (six) hours as needed for nausea or vomiting. 30 tablet 1  . promethazine (PHENERGAN) 25 MG suppository Place 1 suppository (25 mg total) rectally every 6 (six) hours as needed for nausea or vomiting. 12 each 1  . nitrofurantoin (MACRODANTIN) 100 MG capsule Take 100 mg by mouth daily.    . Oxycodone HCl 10 MG TABS Take 1 tablet (10 mg total) by mouth 2 (two) times daily. 60 tablet 0   No current facility-administered medications for this visit.     OBJECTIVE: Vitals:   08/29/17 1347  BP: 123/90  Pulse: 89  Resp: 18  Temp: (!) 97.5 F (36.4 C)     Body mass index is 40.92 kg/m.    ECOG FS:1 - Symptomatic but completely ambulatory  General: Well-developed, well-nourished, no acute distress. Eyes: Pink conjunctiva, anicteric sclera. Lungs: Clear to auscultation bilaterally. Heart: Regular rate and rhythm. No rubs, murmurs, or gallops. Abdomen: Soft, nontender, nondistended. No organomegaly noted, normoactive bowel sounds. GU: Exam recently performed [facility]. Musculoskeletal: No edema, cyanosis, or clubbing. Neuro: Alert, answering all questions appropriately. Cranial nerves grossly intact. Skin: No rashes or petechiae noted. Psych: Normal affect.   LAB RESULTS:  Lab Results  Component Value Date   NA 136 08/29/2017   K 3.8 08/29/2017   CL 107 08/29/2017   CO2 19 (L) 08/29/2017   GLUCOSE 98 08/29/2017   BUN 28 (H) 08/29/2017   CREATININE 1.73 (H) 08/29/2017   CALCIUM 9.5 08/29/2017   PROT 9.2 (H) 08/23/2017   ALBUMIN 4.0 08/23/2017   AST 21 08/23/2017   ALT 9 (L) 08/23/2017   ALKPHOS 84 08/23/2017   BILITOT 0.5 08/23/2017   GFRNONAA 33 (L) 08/29/2017   GFRAA 38 (L) 08/29/2017    Lab Results  Component Value Date   WBC 6.9 08/29/2017   NEUTROABS 5.2 08/29/2017   HGB 10.3 (L) 08/29/2017   HCT 30.3 (L) 08/29/2017   MCV 78.4 (L) 08/29/2017   PLT 365 08/29/2017     STUDIES: Ct Renal Stone Study  Result Date: 08/12/2017 CLINICAL DATA:  51 y/o F; right lower quadrant and right lower back pain. History of cervical cancer. EXAM: CT ABDOMEN AND PELVIS WITHOUT CONTRAST TECHNIQUE: Multidetector CT imaging of the abdomen and pelvis was performed following the standard protocol without IV contrast. COMPARISON:  06/27/2017 CT abdomen and pelvis FINDINGS: Lower chest: No acute abnormality. Hepatobiliary: No focal liver abnormality is seen. Status post cholecystectomy. No biliary dilatation. Pancreas: Unremarkable. No pancreatic ductal dilatation or surrounding inflammatory changes. Spleen: Normal in size without focal abnormality. Adrenals/Urinary Tract: Right-sided nephrostomy tube is unchanged in position correlating in the right renal pelvis. Decompressed right renal collecting system. No focal kidney lesion identified. Normal appearance of the bladder. Stomach/Bowel: Stomach is within normal limits. Appendix appears normal. No evidence of bowel wall thickening, distention, or inflammatory changes. Stable sigmoid diverticulosis.  Vascular/Lymphatic: Aortic atherosclerosis. No enlarged abdominal or pelvic lymph nodes. Reproductive: Unchanged cervical mass eccentric to the right with paracervical fat infiltration inclusive of the right distal ureter. Normal noncontrast appearance of the uterus and adnexa Other: Small paraumbilical hernia containing fat. Musculoskeletal: No fracture is seen. IMPRESSION: 1. No acute process identified. 2. Stable cervical mass eccentric to the right with infiltration of paracervical fat inclusive of the right distal ureter. 3. Sigmoid diverticulosis without evidence for acute diverticulitis. 4. Stable position of right nephrostomy catheter coiling in the renal pelvis. 5. Mild aortic atherosclerosis. Electronically Signed   By: Kristine Garbe M.D.   On: 08/12/2017 05:32   Ir Nephrostomy Exchange Right  Result Date: 08/17/2017 INDICATION: History of cervical cancer post right-sided percutaneous nephrostomy catheter placement on 02/23/2017. Patient was initially scheduled for attempted conversion of the nephrostomy catheter to a double-J ureteral stent, however was recently admitted with a urinary tract infection and as such, request change to the routine nephrostomy catheter exchange. Given patient discomfort during previous catheter exchanges, the patient requests the procedure to be performed with conscious sedation. EXAM: FLUOROSCOPIC GUIDED RIGHT SIDED NEPHROSTOMY CATHETER EXCHANGE COMPARISON:  CT abdomen pelvis - 08/12/2017; 06/27/2017; right-sided percutaneous nephrostomy  catheter placement - 02/23/2017; right-sided percutaneous nephrostomy catheter exchange - 06/08/2017 CONTRAST:  10 mL Isovue-300 administered into the collecting system FLUOROSCOPY TIME:  18 seconds (14 mGy) ANESTHESIA/SEDATION: Moderate (conscious) sedation was employed during this procedure. A total of Versed 1 mg and Fentanyl 50 mcg was administered intravenously. Moderate Sedation Time: 18 minutes. The patient's level of  consciousness and vital signs were monitored continuously by radiology nursing throughout the procedure under my direct supervision. COMPLICATIONS: None immediate. TECHNIQUE: Informed written consent was obtained from the patient after a discussion of the risks, benefits and alternatives to treatment. Questions regarding the procedure were encouraged and answered. A timeout was performed prior to the initiation of the procedure. The right flank and external portion of existing nephrostomy catheter were prepped and draped in the usual sterile fashion. A sterile drape was applied covering the operative field. Maximum barrier sterile technique with sterile gowns and gloves were used for the procedure. A timeout was performed prior to the initiation of the procedure. A pre procedural spot fluoroscopic image was obtained after contrast was injected via the existing nephrostomy catheter demonstrating appropriate positioning within the renal pelvis. The existing nephrostomy catheter was cut and cannulated with a Benson wire which was coiled within the renal pelvis. Under intermittent fluoroscopic guidance, the existing nephrostomy catheter was exchanged for a new 10.2 [place] all-purpose drainage catheter. Contrast injection confirmed appropriate positioning within the renal pelvis and a post exchange fluoroscopic image was obtained. The catheter was locked and secured to the skin with a suture. A dressing was placed. The patient tolerated the procedure well without immediate postprocedural complication. FINDINGS: The existing nephrostomy catheter is appropriately positioned and functioning. After successful fluoroscopic guided exchange, the new nephrostomy catheter is coiled and locked within the right renal pelvis. Note is again made of a nonocclusive filling defect within the caudal aspect of the right renal pelvis, similar to prior nephrostomy catheter exchange performed 06/08/2017 though without a CT correlate on  either the 9/2 or the 7/18 examinations. IMPRESSION: 1. Successful fluoroscopic guided exchange of right sided 10.2 French percutaneous nephrostomy catheter. 2. Note is again made of a nonocclusive filling defect in caudal aspect the right renal pelvis, similar to prior nephrostomy catheter exchange performed 06/08/2017, though without a CT correlate on either the 7/18 or the 9/2 examinations. Differential considerations include an air bubble, chronic thrombus or (less likely) a discrete collecting system lesion. Continued attention on follow-up is recommended. Electronically Signed   By: Sandi Mariscal M.D.   On: 08/17/2017 08:44    ASSESSMENT: Squamous cell carcinoma of the cervix.  PLAN:    1. Squamous cell carcinoma of the cervix: Patient completed concurrent chemotherapy and XRT in April 2018. Brachytherapy at [facility] has also been completed. CT scan results reviewed independently and reported as above with no obvious residual disease. Although patient had PET scan at [facility] on July 25, 2017 that was possibly suggestive of residual malignancy. No intervention or treatment planned at this time, but patient has a repeat PET scan at [facility] in November 2018. Return to clinic in 1 month for further evaluation.  2. Pain: Worse today. Patient was given a prescription for oxycodone. Follow-up as above.  3. Nausea: Resolved. Continue Compazine and Phenergan suppositories as needed. 4. Constipation: Continue MiraLAX as needed. 5. Anxiety: Continue Xanax as needed. 6. Renal insufficiency: Right nephrostomy tube in place.  Patient had replacement on August 17, 2017. Continue monitoring treatment per nephrology.  Patient expressed understanding and was in  agreement with this plan. She also understands that She can call clinic at any time with any questions, concerns, or complaints.   Cancer Staging Malignant neoplasm of cervix [facility]) Staging form: Corpus Uteri - Carcinoma and  Carcinosarcoma, AJCC 8th Edition - Clinical stage from 02/18/2017: FIGO Stage IIIC1 (cT3b, cN1, cM0) - Signed by Lloyd Huger, MD on 02/18/2017   Lloyd Huger, MD   09/03/2017 2:50 PM

## 2017-08-29 ENCOUNTER — Inpatient Hospital Stay: Payer: Medicaid Other

## 2017-08-29 ENCOUNTER — Inpatient Hospital Stay: Payer: Medicaid Other | Attending: Obstetrics and Gynecology | Admitting: Obstetrics and Gynecology

## 2017-08-29 ENCOUNTER — Ambulatory Visit (INDEPENDENT_AMBULATORY_CARE_PROVIDER_SITE_OTHER): Payer: Self-pay | Admitting: Urology

## 2017-08-29 ENCOUNTER — Inpatient Hospital Stay (HOSPITAL_BASED_OUTPATIENT_CLINIC_OR_DEPARTMENT_OTHER): Payer: Medicaid Other | Admitting: Oncology

## 2017-08-29 ENCOUNTER — Encounter: Payer: Self-pay | Admitting: Obstetrics and Gynecology

## 2017-08-29 ENCOUNTER — Ambulatory Visit: Admission: RE | Admit: 2017-08-29 | Payer: Self-pay | Source: Ambulatory Visit | Admitting: Radiation Oncology

## 2017-08-29 VITALS — BP 123/90 | HR 89 | Temp 97.5°F | Resp 18 | Wt 245.9 lb

## 2017-08-29 VITALS — BP 139/90 | HR 86 | Ht 65.0 in | Wt 249.0 lb

## 2017-08-29 DIAGNOSIS — G893 Neoplasm related pain (acute) (chronic): Secondary | ICD-10-CM

## 2017-08-29 DIAGNOSIS — Z923 Personal history of irradiation: Secondary | ICD-10-CM | POA: Diagnosis not present

## 2017-08-29 DIAGNOSIS — C539 Malignant neoplasm of cervix uteri, unspecified: Secondary | ICD-10-CM

## 2017-08-29 DIAGNOSIS — F419 Anxiety disorder, unspecified: Secondary | ICD-10-CM | POA: Insufficient documentation

## 2017-08-29 DIAGNOSIS — C538 Malignant neoplasm of overlapping sites of cervix uteri: Secondary | ICD-10-CM

## 2017-08-29 DIAGNOSIS — Z9221 Personal history of antineoplastic chemotherapy: Secondary | ICD-10-CM | POA: Diagnosis not present

## 2017-08-29 DIAGNOSIS — K429 Umbilical hernia without obstruction or gangrene: Secondary | ICD-10-CM

## 2017-08-29 DIAGNOSIS — Z87891 Personal history of nicotine dependence: Secondary | ICD-10-CM

## 2017-08-29 DIAGNOSIS — Z79899 Other long term (current) drug therapy: Secondary | ICD-10-CM

## 2017-08-29 DIAGNOSIS — I700 Atherosclerosis of aorta: Secondary | ICD-10-CM | POA: Insufficient documentation

## 2017-08-29 DIAGNOSIS — Z809 Family history of malignant neoplasm, unspecified: Secondary | ICD-10-CM | POA: Insufficient documentation

## 2017-08-29 DIAGNOSIS — M545 Low back pain: Secondary | ICD-10-CM | POA: Insufficient documentation

## 2017-08-29 DIAGNOSIS — C55 Malignant neoplasm of uterus, part unspecified: Secondary | ICD-10-CM

## 2017-08-29 DIAGNOSIS — R112 Nausea with vomiting, unspecified: Secondary | ICD-10-CM | POA: Insufficient documentation

## 2017-08-29 DIAGNOSIS — Z9049 Acquired absence of other specified parts of digestive tract: Secondary | ICD-10-CM | POA: Diagnosis not present

## 2017-08-29 DIAGNOSIS — K5900 Constipation, unspecified: Secondary | ICD-10-CM | POA: Insufficient documentation

## 2017-08-29 DIAGNOSIS — Z936 Other artificial openings of urinary tract status: Secondary | ICD-10-CM

## 2017-08-29 DIAGNOSIS — K5730 Diverticulosis of large intestine without perforation or abscess without bleeding: Secondary | ICD-10-CM | POA: Insufficient documentation

## 2017-08-29 DIAGNOSIS — N1339 Other hydronephrosis: Secondary | ICD-10-CM

## 2017-08-29 LAB — CBC WITH DIFFERENTIAL/PLATELET
Basophils Absolute: 0.1 K/uL (ref 0–0.1)
Basophils Relative: 1 %
Eosinophils Absolute: 0.3 K/uL (ref 0–0.7)
Eosinophils Relative: 4 %
HCT: 30.3 % — ABNORMAL LOW (ref 35.0–47.0)
Hemoglobin: 10.3 g/dL — ABNORMAL LOW (ref 12.0–16.0)
Lymphocytes Relative: 13 %
Lymphs Abs: 0.9 K/uL — ABNORMAL LOW (ref 1.0–3.6)
MCH: 26.7 pg (ref 26.0–34.0)
MCHC: 34 g/dL (ref 32.0–36.0)
MCV: 78.4 fL — ABNORMAL LOW (ref 80.0–100.0)
Monocytes Absolute: 0.5 K/uL (ref 0.2–0.9)
Monocytes Relative: 7 %
Neutro Abs: 5.2 K/uL (ref 1.4–6.5)
Neutrophils Relative %: 75 %
Platelets: 365 K/uL (ref 150–440)
RBC: 3.86 MIL/uL (ref 3.80–5.20)
RDW: 16.1 % — ABNORMAL HIGH (ref 11.5–14.5)
WBC: 6.9 K/uL (ref 3.6–11.0)

## 2017-08-29 LAB — BASIC METABOLIC PANEL
Anion gap: 10 (ref 5–15)
BUN: 28 mg/dL — AB (ref 6–20)
CALCIUM: 9.5 mg/dL (ref 8.9–10.3)
CHLORIDE: 107 mmol/L (ref 101–111)
CO2: 19 mmol/L — AB (ref 22–32)
CREATININE: 1.73 mg/dL — AB (ref 0.44–1.00)
GFR calc Af Amer: 38 mL/min — ABNORMAL LOW (ref >60)
GFR calc non Af Amer: 33 mL/min — ABNORMAL LOW (ref >60)
GLUCOSE: 98 mg/dL (ref 65–99)
Potassium: 3.8 mmol/L (ref 3.5–5.1)
Sodium: 136 mmol/L (ref 135–145)

## 2017-08-29 LAB — MAGNESIUM: Magnesium: 1.9 mg/dL (ref 1.7–2.4)

## 2017-08-29 MED ORDER — OXYCODONE HCL 10 MG PO TABS: 10.0000 mg | ORAL_TABLET | Freq: Two times a day (BID) | ORAL | 0 refills | Status: DC

## 2017-08-29 NOTE — Progress Notes (Signed)
Gynecologic Oncology Interval Visit   Referring Provider: ED  Chief Concern: Stage IIIb cervical cancer  Subjective:  Patricia Rojas is a 51 y.o. multiparous female who presents for follow up of Stage IIIb cervical cancer and right hydronephrosis s/p PCN and subsequent exchange for R ureteral stent.   She has completed concurrent chemotherapy and external beam XRT in April 2018. Brachytherapy at [facility] was completed with HDR x 5 on 04/20/2017.  07/25/2017 PET/CT  Impression:  1. Hypermetabolic FDG uptake in the region of the cervix. Post- treatment  changes may contribute to the increased radiotracer activity, but the  degree of radioactivity is concerning for residual disease.   2. No evidence of FDG avid nodal or distant metastatic disease.  Dr. Christel Mormon saw her on 07/25/2017 and had a negative pelvic exam. He recommended repeat imaging in 3 months at [facility].    08/12/2017-08/15/2017 [facility] admission for urinary tract infection associated with nephrostomy catheter.   Gynecologic Oncology History  Patricia Rojas is a pleasant multiparous female diagnosed with stage IIIb cervical cancer who is seen in consultation from ED. Please see prior notes for details.   02/07/17 CT scan shows large cervical mass and and right hydronephrosis. 1. Near circumferential irregular masslike density involving the lower uterus or cervix highly concerning for primary neoplasm. 2. Uterine/cervical mass causes obstruction of the distal right ureter in the pelvis with moderate hydroureteronephrosis. 3. Left external 8 mm iliac node is not enlarged by size criteria, however is indeterminate. 4. Colonic diverticulosis without diverticulitis. 5. Small nodes adjacent to the distal esophagus are of uncertain etiology and significance.   02/07/17 Cervical biopsy invasive squamous cell carcinoma.  She was treated with primary chemoradiation.     Past Medical History: Past Medical History:  Diagnosis Date  . Cancer  (HCC)    cervical  . Constipation   . Hemorrhoids   . N&V (nausea and vomiting)   . PONV (postoperative nausea and vomiting)     Past Surgical History: Past Surgical History:  Procedure Laterality Date  . CESAREAN SECTION  1986  . CHOLECYSTECTOMY    . HERNIA REPAIR    . IR GENERIC HISTORICAL  02/23/2017   IR NEPHROSTOMY PLACEMENT RIGHT 02/23/2017 [name], MD [facility]-INTERV RAD  . IR NEPHROSTOMY EXCHANGE RIGHT  04/18/2017  . IR NEPHROSTOMY EXCHANGE RIGHT  06/08/2017  . IR NEPHROSTOMY EXCHANGE RIGHT  08/17/2017   Family History: Family History  Problem Relation Age of Onset  . Cancer Maternal Uncle   . Cancer Paternal Aunt   . Kidney cancer Neg Hx   . Bladder Cancer Neg Hx     Social History: Social History   Social History  . Marital status: Single    Spouse name: N/A  . Number of children: N/A  . Years of education: N/A   Occupational History  . Not on file.   Social History Main Topics  . Smoking status: Former Smoker    Types: Cigarettes    Quit date: 79  . Smokeless tobacco: Never Used  . Alcohol use No  . Drug use: No     Comment: stopped 3 weeks ago  . Sexual activity: Not on file   Other Topics Concern  . Not on file   Social History Narrative  . No narrative on file    Allergies: No Known Allergies  Current Medications: Current Outpatient Prescriptions  Medication Sig Dispense Refill  . ALPRAZolam (XANAX) 0.25 MG tablet Take 1-2 tabs daily as needed 60  tablet 0  . amLODipine (NORVASC) 2.5 MG tablet Take 1 tablet (2.5 mg total) by mouth daily. 30 tablet 0  . nitrofurantoin, macrocrystal-monohydrate, (MACROBID) 100 MG capsule Take 100 mg by mouth daily.    . Oxycodone HCl 10 MG TABS Take 1 tablet (10 mg total) by mouth 2 (two) times daily. 60 tablet 0  . prochlorperazine (COMPAZINE) 10 MG tablet Take 1 tablet (10 mg total) by mouth every 6 (six) hours as needed for nausea or vomiting. 30 tablet 1  . promethazine (PHENERGAN) 25 MG suppository  Place 1 suppository (25 mg total) rectally every 6 (six) hours as needed for nausea or vomiting. 12 each 1  . traMADol (ULTRAM) 50 MG tablet Take 1 tablet (50 mg total) by mouth every 6 (six) hours as needed. 90 tablet 1   No current facility-administered medications for this visit.     Review of Systems  General: fatigue, decreased appetite  HEENT: no complaints  Lungs: no complaints  Cardiac: no complaints  GI: abdominal pain, nausea/vomiting/constipation  GU: no complaints  Musculoskeletal: no complaints  Extremities: no complaints  Skin: no complaints  Neuro: no complaints  Endocrine: no complaints  Psych: no complaints       Objective:  Physical Examination:  T 97.5 P 89 R 18 BP 123/90   ECOG Performance Status: 1 - Symptomatic but completely ambulatory  General appearance: alert, cooperative and appears stated age HEENT:PERRLA, neck supple with midline trachea and thyroid without masses Lymph node survey: non-palpable inguinal  Abdomen: obese, soft  Extremities: extremities normal, atraumatic, no cyanosis or edema  Neurological exam reveals alert, oriented, normal speech, no focal findings or movement disorder noted.  Pelvic: exam chaperoned by nurse;  Vulva: normal appearing vulva with no masses, tenderness or lesions; Vagina: normal vagina visually but agglutinated to the cervix; Adnexa:tenderness on the right and no masses but limited exam due to habitus; Cervix: the massive distention with barrel shaped tumor in canal has now resolved, there is only a small nodule at 5 o'clock that measure mm, the cervix is firm to palpation and limited mobility; Uterus: not palpable. BME: on digital exam there is firmness and nodularity on the right vagina that I suspect extends to the parametrium.  Rectal: was not performed due to significant constipation and patient request.    Lab Review Lab Results  Component Value Date   WBC 6.9 08/29/2017   HGB 10.3 (L) 08/29/2017    HCT 30.3 (L) 08/29/2017   MCV 78.4 (L) 08/29/2017   PLT 365 08/29/2017   BMP Latest Ref Rng & Units 08/29/2017 08/23/2017 08/15/2017  Glucose 65 - 99 mg/dL 98 102(H) 87  BUN 6 - 20 mg/dL 28(H) 16 11  Creatinine 0.44 - 1.00 mg/dL 1.73(H) 1.40(H) 1.14(H)  Sodium 135 - 145 mmol/L 136 136 138  Potassium 3.5 - 5.1 mmol/L 3.8 3.6 4.0  Chloride 101 - 111 mmol/L 107 101 106  CO2 22 - 32 mmol/L 19(L) 26 26  Calcium 8.9 - 10.3 mg/dL 9.5 9.5 8.9        Assessment:  KAIDYNCE PFISTER is a 50 y.o. female diagnosed with stage IIIB cervical squamous cell cancer.  CT shows the cervical mass extending into the right parametrial with some right ureteral obstruction and hydronephrosis s/p R PCN and subsequent stent.  S/p primary chemoradiation and HDR brachytherapy. Post treatment PET positive findings, symptoms and exam concerning for persistent disease.     Medical co-morbidities complicating care: prior chemoradiation.  Plan:  Problem List Items Addressed This Visit      Genitourinary   Malignant neoplasm of cervix [facility]) - Primary     Agree with follow up CT/PET with Dr. Christel Mormon in November 2018. I also discussed that if her symptoms worsen to contact us and we can order imaging sooner. If her scans demonstrate persistence or progression with no evidence of metastatic disease we can explore surgical management. If metastatic disease then systemic therapy - chemotherapy vs immunotherapy.   Plan for RTC in 5 months.   The patient's diagnosis, an outline of the further diagnostic and laboratory studies which will be required, the recommendation, and alternatives were discussed.  All questions were answered to the patient's satisfaction.  A total of 60 minutes were spent with the patient/family today; 50% was spent in education, counseling and coordination of care for probable cervical cancer.    Gillis Ends, MD

## 2017-09-10 ENCOUNTER — Inpatient Hospital Stay: Payer: Medicaid Other

## 2017-09-10 ENCOUNTER — Inpatient Hospital Stay
Admission: AD | Admit: 2017-09-10 | Discharge: 2017-09-13 | DRG: 682 | Disposition: A | Discharge status: Home / Self Care | Payer: Medicaid Other | Source: Ambulatory Visit | Attending: Internal Medicine | Admitting: Internal Medicine

## 2017-09-10 ENCOUNTER — Inpatient Hospital Stay: Payer: Medicaid Other | Attending: Oncology

## 2017-09-10 ENCOUNTER — Ambulatory Visit: Admission: RE | Admit: 2017-09-10 | Payer: Self-pay | Source: Ambulatory Visit

## 2017-09-10 ENCOUNTER — Other Ambulatory Visit: Payer: Self-pay | Admitting: *Deleted

## 2017-09-10 ENCOUNTER — Inpatient Hospital Stay (HOSPITAL_BASED_OUTPATIENT_CLINIC_OR_DEPARTMENT_OTHER): Payer: Medicaid Other | Admitting: Oncology

## 2017-09-10 VITALS — BP 132/82 | HR 80 | Temp 97.6°F | Resp 20 | Wt 236.4 lb

## 2017-09-10 DIAGNOSIS — Z8541 Personal history of malignant neoplasm of cervix uteri: Secondary | ICD-10-CM | POA: Insufficient documentation

## 2017-09-10 DIAGNOSIS — Z9221 Personal history of antineoplastic chemotherapy: Secondary | ICD-10-CM

## 2017-09-10 DIAGNOSIS — K5900 Constipation, unspecified: Secondary | ICD-10-CM | POA: Insufficient documentation

## 2017-09-10 DIAGNOSIS — N170 Acute kidney failure with tubular necrosis: Principal | ICD-10-CM | POA: Diagnosis present

## 2017-09-10 DIAGNOSIS — R112 Nausea with vomiting, unspecified: Secondary | ICD-10-CM

## 2017-09-10 DIAGNOSIS — Z6838 Body mass index (BMI) 38.0-38.9, adult: Secondary | ICD-10-CM | POA: Diagnosis not present

## 2017-09-10 DIAGNOSIS — Z87891 Personal history of nicotine dependence: Secondary | ICD-10-CM

## 2017-09-10 DIAGNOSIS — K5730 Diverticulosis of large intestine without perforation or abscess without bleeding: Secondary | ICD-10-CM

## 2017-09-10 DIAGNOSIS — Z936 Other artificial openings of urinary tract status: Secondary | ICD-10-CM

## 2017-09-10 DIAGNOSIS — R630 Anorexia: Secondary | ICD-10-CM | POA: Diagnosis present

## 2017-09-10 DIAGNOSIS — A419 Sepsis, unspecified organism: Secondary | ICD-10-CM

## 2017-09-10 DIAGNOSIS — R1011 Right upper quadrant pain: Secondary | ICD-10-CM

## 2017-09-10 DIAGNOSIS — E860 Dehydration: Secondary | ICD-10-CM | POA: Diagnosis present

## 2017-09-10 DIAGNOSIS — Z923 Personal history of irradiation: Secondary | ICD-10-CM

## 2017-09-10 DIAGNOSIS — F329 Major depressive disorder, single episode, unspecified: Secondary | ICD-10-CM | POA: Insufficient documentation

## 2017-09-10 DIAGNOSIS — C539 Malignant neoplasm of cervix uteri, unspecified: Secondary | ICD-10-CM | POA: Diagnosis present

## 2017-09-10 DIAGNOSIS — E46 Unspecified protein-calorie malnutrition: Secondary | ICD-10-CM | POA: Insufficient documentation

## 2017-09-10 DIAGNOSIS — F419 Anxiety disorder, unspecified: Secondary | ICD-10-CM | POA: Insufficient documentation

## 2017-09-10 DIAGNOSIS — R109 Unspecified abdominal pain: Secondary | ICD-10-CM

## 2017-09-10 DIAGNOSIS — I700 Atherosclerosis of aorta: Secondary | ICD-10-CM | POA: Insufficient documentation

## 2017-09-10 DIAGNOSIS — Z79899 Other long term (current) drug therapy: Secondary | ICD-10-CM | POA: Insufficient documentation

## 2017-09-10 DIAGNOSIS — K429 Umbilical hernia without obstruction or gangrene: Secondary | ICD-10-CM | POA: Insufficient documentation

## 2017-09-10 DIAGNOSIS — N183 Chronic kidney disease, stage 3 (moderate): Secondary | ICD-10-CM | POA: Diagnosis present

## 2017-09-10 DIAGNOSIS — N179 Acute kidney failure, unspecified: Secondary | ICD-10-CM | POA: Insufficient documentation

## 2017-09-10 DIAGNOSIS — R1031 Right lower quadrant pain: Secondary | ICD-10-CM

## 2017-09-10 DIAGNOSIS — D649 Anemia, unspecified: Secondary | ICD-10-CM

## 2017-09-10 DIAGNOSIS — B3749 Other urogenital candidiasis: Secondary | ICD-10-CM | POA: Diagnosis present

## 2017-09-10 DIAGNOSIS — E43 Unspecified severe protein-calorie malnutrition: Secondary | ICD-10-CM | POA: Diagnosis present

## 2017-09-10 DIAGNOSIS — Z809 Family history of malignant neoplasm, unspecified: Secondary | ICD-10-CM | POA: Insufficient documentation

## 2017-09-10 DIAGNOSIS — E871 Hypo-osmolality and hyponatremia: Secondary | ICD-10-CM

## 2017-09-10 DIAGNOSIS — G8929 Other chronic pain: Secondary | ICD-10-CM | POA: Diagnosis present

## 2017-09-10 DIAGNOSIS — C55 Malignant neoplasm of uterus, part unspecified: Secondary | ICD-10-CM

## 2017-09-10 DIAGNOSIS — I10 Essential (primary) hypertension: Secondary | ICD-10-CM | POA: Insufficient documentation

## 2017-09-10 LAB — CBC WITH DIFFERENTIAL/PLATELET
Basophils Absolute: 0.1 10*3/uL (ref 0–0.1)
Basophils Relative: 1 %
EOS PCT: 2 %
Eosinophils Absolute: 0.1 10*3/uL (ref 0–0.7)
HCT: 30.6 % — ABNORMAL LOW (ref 35.0–47.0)
HEMOGLOBIN: 10.5 g/dL — AB (ref 12.0–16.0)
LYMPHS ABS: 0.9 10*3/uL — AB (ref 1.0–3.6)
LYMPHS PCT: 11 %
MCH: 26.7 pg (ref 26.0–34.0)
MCHC: 34.3 g/dL (ref 32.0–36.0)
MCV: 77.7 fL — AB (ref 80.0–100.0)
MONOS PCT: 6 %
Monocytes Absolute: 0.5 10*3/uL (ref 0.2–0.9)
Neutro Abs: 6.3 10*3/uL (ref 1.4–6.5)
Neutrophils Relative %: 80 %
Platelets: 368 10*3/uL (ref 150–440)
RBC: 3.94 MIL/uL (ref 3.80–5.20)
RDW: 16 % — ABNORMAL HIGH (ref 11.5–14.5)
WBC: 7.9 10*3/uL (ref 3.6–11.0)

## 2017-09-10 LAB — COMPREHENSIVE METABOLIC PANEL
ALK PHOS: 92 U/L (ref 38–126)
ALT: 12 U/L — AB (ref 14–54)
AST: 17 U/L (ref 15–41)
Albumin: 3.8 g/dL (ref 3.5–5.0)
Anion gap: 11 (ref 5–15)
BUN: 42 mg/dL — ABNORMAL HIGH (ref 6–20)
CALCIUM: 9.7 mg/dL (ref 8.9–10.3)
CO2: 23 mmol/L (ref 22–32)
Chloride: 100 mmol/L — ABNORMAL LOW (ref 101–111)
Creatinine, Ser: 3.31 mg/dL — ABNORMAL HIGH (ref 0.44–1.00)
GFR, EST AFRICAN AMERICAN: 17 mL/min — AB (ref >60)
GFR, EST NON AFRICAN AMERICAN: 15 mL/min — AB (ref >60)
Glucose, Bld: 95 mg/dL (ref 65–99)
Potassium: 3.6 mmol/L (ref 3.5–5.1)
Sodium: 134 mmol/L — ABNORMAL LOW (ref 135–145)
TOTAL PROTEIN: 9.2 g/dL — AB (ref 6.5–8.1)
Total Bilirubin: 0.5 mg/dL (ref 0.3–1.2)

## 2017-09-10 LAB — URINALYSIS, COMPLETE (UACMP) WITH MICROSCOPIC
Bilirubin Urine: NEGATIVE
GLUCOSE, UA: NEGATIVE mg/dL
Ketones, ur: 5 mg/dL — AB
NITRITE: NEGATIVE
PH: 5 (ref 5.0–8.0)
Protein, ur: 100 mg/dL — AB
SPECIFIC GRAVITY, URINE: 1.023 (ref 1.005–1.030)

## 2017-09-10 MED ORDER — BISACODYL 5 MG PO TBEC
5.0000 mg | DELAYED_RELEASE_TABLET | Freq: Every day | ORAL | Status: DC | PRN
  Administered 2017-09-12: 21:00:00 5 mg via ORAL
  Filled 2017-09-10 (×2): qty 1

## 2017-09-10 MED ORDER — ACETAMINOPHEN 325 MG PO TABS: 650.0000 mg | ORAL_TABLET | Freq: Four times a day (QID) | ORAL | Status: DC | PRN

## 2017-09-10 MED ORDER — SODIUM CHLORIDE 0.9 % IV SOLN
Freq: Once | INTRAVENOUS | Status: AC
  Administered 2017-09-10: 15:00:00 via INTRAVENOUS
  Filled 2017-09-10: qty 4

## 2017-09-10 MED ORDER — SODIUM CHLORIDE 0.9 % IV SOLN
INTRAVENOUS | Status: DC
  Administered 2017-09-10 – 2017-09-12 (×4): via INTRAVENOUS

## 2017-09-10 MED ORDER — ONDANSETRON HCL 4 MG/2ML IJ SOLN
4.0000 mg | Freq: Four times a day (QID) | INTRAMUSCULAR | Status: DC | PRN
  Administered 2017-09-10 – 2017-09-12 (×6): 4 mg via INTRAVENOUS
  Filled 2017-09-10 (×6): qty 2

## 2017-09-10 MED ORDER — SODIUM CHLORIDE 0.9 % IV SOLN
INTRAVENOUS | Status: DC
  Administered 2017-09-10: 15:00:00 via INTRAVENOUS
  Filled 2017-09-10 (×2): qty 1000

## 2017-09-10 MED ORDER — SENNOSIDES-DOCUSATE SODIUM 8.6-50 MG PO TABS: 1.0000 | ORAL_TABLET | Freq: Every evening | ORAL | Status: DC | PRN

## 2017-09-10 MED ORDER — HYDROCODONE-ACETAMINOPHEN 5-325 MG PO TABS
1.0000 | ORAL_TABLET | ORAL | Status: DC | PRN
  Administered 2017-09-11 (×2): 1 via ORAL
  Administered 2017-09-11 – 2017-09-13 (×6): 2 via ORAL
  Filled 2017-09-10 (×6): qty 2
  Filled 2017-09-10: qty 1
  Filled 2017-09-10: qty 2

## 2017-09-10 MED ORDER — MORPHINE SULFATE 2 MG/ML IJ SOLN
2.0000 mg | INTRAMUSCULAR | Status: DC | PRN
  Administered 2017-09-10: 2 mg via INTRAVENOUS
  Filled 2017-09-10: qty 1

## 2017-09-10 MED ORDER — ONDANSETRON HCL 4 MG PO TABS: 4.0000 mg | ORAL_TABLET | Freq: Four times a day (QID) | ORAL | Status: DC | PRN

## 2017-09-10 MED ORDER — LEVOFLOXACIN IN D5W 750 MG/150ML IV SOLN
750.0000 mg | INTRAVENOUS | Status: DC
  Administered 2017-09-10: 20:00:00 750 mg via INTRAVENOUS
  Filled 2017-09-10: qty 150

## 2017-09-10 MED ORDER — MORPHINE SULFATE 2 MG/ML IJ SOLN
2.0000 mg | Freq: Once | INTRAMUSCULAR | Status: AC
  Administered 2017-09-10: 2 mg via INTRAVENOUS
  Filled 2017-09-10: qty 1

## 2017-09-10 MED ORDER — HYDROMORPHONE HCL 1 MG/ML IJ SOLN
0.5000 mg | INTRAMUSCULAR | Status: DC | PRN
  Administered 2017-09-10 – 2017-09-13 (×16): 0.5 mg via INTRAVENOUS
  Filled 2017-09-10 (×16): qty 1

## 2017-09-10 MED ORDER — ACETAMINOPHEN 650 MG RE SUPP: 650.0000 mg | Freq: Four times a day (QID) | RECTAL | Status: DC | PRN

## 2017-09-10 MED ORDER — MORPHINE SULFATE (PF) 2 MG/ML IV SOLN
2.0000 mg | INTRAVENOUS | Status: DC | PRN
  Administered 2017-09-10: 17:00:00 2 mg via INTRAVENOUS
  Filled 2017-09-10: qty 1

## 2017-09-10 MED ORDER — IOPAMIDOL (ISOVUE-300) INJECTION 61%
15.0000 mL | INTRAVENOUS | Status: AC
  Administered 2017-09-10 (×2): 15 mL via ORAL

## 2017-09-10 MED ORDER — PROMETHAZINE HCL 25 MG/ML IJ SOLN
25.0000 mg | Freq: Four times a day (QID) | INTRAMUSCULAR | Status: DC | PRN
  Administered 2017-09-10 – 2017-09-13 (×8): 25 mg via INTRAVENOUS
  Filled 2017-09-10 (×8): qty 1

## 2017-09-10 MED ORDER — MORPHINE SULFATE (PF) 2 MG/ML IV SOLN: 1.0000 mg | INTRAVENOUS | Status: DC | PRN

## 2017-09-10 MED ORDER — ENOXAPARIN SODIUM 30 MG/0.3ML ~~LOC~~ SOLN
30.0000 mg | SUBCUTANEOUS | Status: DC
  Filled 2017-09-10: qty 0.3

## 2017-09-10 NOTE — Progress Notes (Signed)
Patient here today for acute add on/symptom management. Patient reports nausea and vomiting x 3 days, cannot tolerate anything by mouth. Patient reports she has not been able to take any of her medications. Patient also reports severe abdominal pain, 10/10 today.

## 2017-09-10 NOTE — Progress Notes (Signed)
Symptom Management Consult note [facility]  Telephone:([phone] [phone]  Patient Care Team: [name], MD as PCP - General (Infectious Diseases) Clent Jacks, RN as Registered Nurse   Name of the patient: Patricia Rojas  [id number]  [birth year]   Date of visit: 09/10/17  Diagnosis- Squamous cell carcinoma of the cervix.  Chief complaint/ Reason for visit- Nausea/Vomiting and Worsening Abdominal Pain   Heme/Onc history: Squamous cell carcinoma of the cervix: Patient completed concurrent chemotherapy and XRT in April 2018. Brachytherapy at [facility] has also been completed. CT scan results reviewed independently and reported as above with no obvious residual disease. Although patient had PET scan at [facility] on July 25, 2017 that was possibly suggestive of residual malignancy. No intervention or treatment planned at this time, but patient has a repeat PET scan at [facility] in November 2018.   Interval history- Patient was seen by Dr. Ola Spurr this morning for a follow-up appointment and it was recommended that she come see [place] due to worsening abdominal pain and nausea/vomiting. Beginning on Friday, patient states she developed worsening abdominal pain, constipation nausea and vomiting. She has been unable to keep any food or drinks down. She is vomiting up bile only. She has tried ginger ale for the vomiting and nausea without relief. Phenergan suppositories have not been helpful. She has had chronic right upper abdominal pain that has recently developed left lower and periumbilical pain. She has tried taking oxycodone but has been unable to keep it down. She has tried a heating pack and ice without relief. She has not had a bowel movement since Friday. She denies shortness of breath, chest pain or flu like symptoms. She denies fevers.   ECOG FS:1 - Symptomatic but completely ambulatory  Review of systems- Review of  Systems  Constitutional: Positive for chills, malaise/fatigue and weight loss. Negative for fever.  HENT: Negative.   Eyes: Negative.   Respiratory: Negative.   Cardiovascular: Negative for chest pain and leg swelling.  Gastrointestinal: Positive for abdominal pain, constipation, nausea and vomiting.  Genitourinary: Negative for dysuria, frequency, hematuria and urgency.  Musculoskeletal: Positive for myalgias.  Skin: Negative.   Neurological: Positive for weakness.  Endo/Heme/Allergies: Negative.   Psychiatric/Behavioral: Negative.      Current treatment- Treatment was 6 months ago.   No Known Allergies   Past Medical History:  Diagnosis Date  . Cancer (HCC)    cervical  . Constipation   . Hemorrhoids   . N&V (nausea and vomiting)   . PONV (postoperative nausea and vomiting)      Past Surgical History:  Procedure Laterality Date  . CESAREAN SECTION  1986  . CHOLECYSTECTOMY    . HERNIA REPAIR    . IR GENERIC HISTORICAL  02/23/2017   IR NEPHROSTOMY PLACEMENT RIGHT 02/23/2017 [name], MD [facility]-INTERV RAD  . IR NEPHROSTOMY EXCHANGE RIGHT  04/18/2017  . IR NEPHROSTOMY EXCHANGE RIGHT  06/08/2017  . IR NEPHROSTOMY EXCHANGE RIGHT  08/17/2017    Social History   Social History  . Marital status: Single    Spouse name: N/A  . Number of children: N/A  . Years of education: N/A   Occupational History  . Not on file.   Social History Main Topics  . Smoking status: Former Smoker    Types: Cigarettes    Quit date: 58  . Smokeless tobacco: Never Used  . Alcohol use No  . Drug use: No  Comment: stopped 3 weeks ago  . Sexual activity: Not on file   Other Topics Concern  . Not on file   Social History Narrative  . No narrative on file    Family History  Problem Relation Age of Onset  . Cancer Maternal Uncle   . Cancer Paternal Aunt   . Kidney cancer Neg Hx   . Bladder Cancer Neg Hx     No current facility-administered medications for this visit.  No  current outpatient prescriptions on file.  Facility-Administered Medications Ordered in Other Visits:  .  0.9 %  sodium chloride infusion, , Intravenous, Continuous, Mody, Sital, MD, Last Rate: 100 mL/hr at 09/11/17 1343 .  acetaminophen (TYLENOL) tablet 650 mg, 650 mg, Oral, Q6H PRN **OR** acetaminophen (TYLENOL) suppository 650 mg, 650 mg, Rectal, Q6H PRN, Mody, Sital, MD .  ALPRAZolam Duanne Moron) tablet 0.25 mg, 0.25 mg, Oral, BID, Epifanio Lesches, MD .  bisacodyl (DULCOLAX) EC tablet 5 mg, 5 mg, Oral, Daily PRN, Mody, Sital, MD .  enoxaparin (LOVENOX) injection 30 mg, 30 mg, Subcutaneous, Q24H, Mody, Sital, MD .  HYDROcodone-acetaminophen (NORCO/VICODIN) 5-325 MG per tablet 1-2 tablet, 1-2 tablet, Oral, Q4H PRN, Bettey Costa, MD, 1 tablet at 09/11/17 1151 .  HYDROmorphone (DILAUDID) injection 0.5 mg, 0.5 mg, Intravenous, Q3H PRN, Benjie Karvonen, Sital, MD, 0.5 mg at 09/11/17 1343 .  lactulose (CHRONULAC) 10 GM/15ML solution 30 g, 30 g, Oral, BID PRN, Murlean Iba, MD, 30 g at 09/11/17 1145 .  levofloxacin (LEVAQUIN) IVPB 750 mg, 750 mg, Intravenous, Q48H, Bettey Costa, MD, Stopped at 09/10/17 2154 .  ondansetron (ZOFRAN) tablet 4 mg, 4 mg, Oral, Q6H PRN **OR** ondansetron (ZOFRAN) injection 4 mg, 4 mg, Intravenous, Q6H PRN, Benjie Karvonen, Sital, MD, 4 mg at 09/11/17 1343 .  polyethylene glycol (MIRALAX / GLYCOLAX) packet 17 g, 17 g, Oral, Daily, Candiss Norse, [place], MD, 17 g at 09/11/17 1145 .  promethazine (PHENERGAN) injection 25 mg, 25 mg, Intravenous, Q6H PRN, Harrie Foreman, MD, 25 mg at 09/11/17 1034 .  protein supplement (UNJURY CHICKEN SOUP) powder 2 oz, 2 oz, Oral, BID, Epifanio Lesches, MD .  senna-docusate (Senokot-S) tablet 1 tablet, 1 tablet, Oral, QHS PRN, Bettey Costa, MD  Physical exam:  Vitals:   09/10/17 1250  BP: 132/82  Pulse: 80  Resp: 20  Temp: 97.6 F (36.4 C)  TempSrc: Tympanic  Weight: 236 lb 6.4 oz (107.2 kg)   Physical Exam  Constitutional: She is oriented to person,  place, and time. She appears to be writhing in pain. She appears distressed.  HENT:  Head: Normocephalic and atraumatic.  Cardiovascular: Normal rate and regular rhythm.   Pulmonary/Chest: Effort normal and breath sounds normal.  Abdominal: Soft. Bowel sounds are hypoactive. There is no hepatosplenomegaly. There is tenderness in the right upper quadrant, epigastric area and left lower quadrant. There is guarding.    Neurological: She is alert and oriented to person, place, and time.  Skin: Skin is warm, dry and intact.  Psychiatric: Mood, memory and judgment normal. She is apathetic.     CMP Latest Ref Rng & Units 09/11/2017  Glucose 65 - 99 mg/dL 113(H)  BUN 6 - 20 mg/dL 40(H)  Creatinine 0.44 - 1.00 mg/dL 2.63(H)  Sodium 135 - 145 mmol/L 136  Potassium 3.5 - 5.1 mmol/L 4.5  Chloride 101 - 111 mmol/L 105  CO2 22 - 32 mmol/L 22  Calcium 8.9 - 10.3 mg/dL 9.2  Total Protein 6.5 - 8.1 g/dL -  Total Bilirubin 0.3 - 1.2  mg/dL -  Alkaline Phos 38 - 126 U/L -  AST 15 - 41 U/L -  ALT 14 - 54 U/L -   CBC Latest Ref Rng & Units 09/11/2017  WBC 3.6 - 11.0 K/uL 6.1  Hemoglobin 12.0 - 16.0 g/dL 9.5(L)  Hematocrit 35.0 - 47.0 % 28.1(L)  Platelets 150 - 440 K/uL 314    No images are attached to the encounter.  Ct Abdomen Pelvis Wo Contrast  Result Date: 09/10/2017 CLINICAL DATA:  Right lower quadrant pain.  Cervical carcinoma. EXAM: CT ABDOMEN AND PELVIS WITHOUT CONTRAST TECHNIQUE: Multidetector CT imaging of the abdomen and pelvis was performed following the standard protocol without IV contrast. COMPARISON:  08/12/2017 FINDINGS: Lower chest: No acute findings. Prior cholecystectomy. No evidence of biliary dilatation. Hepatobiliary:  No mass visualized on this unenhanced exam. Pancreas: No mass or inflammatory process visualized on this unenhanced exam. Spleen:  Within normal limits in size. Adrenals/Urinary tract: Right-sided percutaneous nephrostomy tube remains in appropriate position. No  evidence of hydronephrosis. Stomach/Bowel: No evidence of obstruction, inflammatory process, or abnormal fluid collections. Diverticulosis mainly involving the descending sigmoid colon, without evidence of diverticulitis. Vascular/Lymphatic: No pathologically enlarged lymph nodes identified. No evidence of abdominal aortic aneurysm. Aortic atherosclerosis. Reproductive: Stable ill-defined cervical mass extending into right parametrial soft tissues. No other pelvic or adnexal mass identified. No evidence of free fluid. Other:  Stable small paraumbilical hernia containing only fat. Musculoskeletal:  No suspicious bone lesions identified. IMPRESSION: No acute findings. Stable ill-defined cervical mass extending into right parametrial soft tissues. No new or progressive disease identified. Colonic diverticulosis, without radiographic evidence of diverticulitis. Electronically Signed   By: Earle Gell M.D.   On: 09/10/2017 21:29   Ir Nephrostomy Exchange Right  Result Date: 08/17/2017 INDICATION: History of cervical cancer post right-sided percutaneous nephrostomy catheter placement on 02/23/2017. Patient was initially scheduled for attempted conversion of the nephrostomy catheter to a double-J ureteral stent, however was recently admitted with a urinary tract infection and as such, request change to the routine nephrostomy catheter exchange. Given patient discomfort during previous catheter exchanges, the patient requests the procedure to be performed with conscious sedation. EXAM: FLUOROSCOPIC GUIDED RIGHT SIDED NEPHROSTOMY CATHETER EXCHANGE COMPARISON:  CT abdomen pelvis - 08/12/2017; 06/27/2017; right-sided percutaneous nephrostomy catheter placement - 02/23/2017; right-sided percutaneous nephrostomy catheter exchange - 06/08/2017 CONTRAST:  10 mL Isovue-300 administered into the collecting system FLUOROSCOPY TIME:  18 seconds (14 mGy) ANESTHESIA/SEDATION: Moderate (conscious) sedation was employed during this  procedure. A total of Versed 1 mg and Fentanyl 50 mcg was administered intravenously. Moderate Sedation Time: 18 minutes. The patient's level of consciousness and vital signs were monitored continuously by radiology nursing throughout the procedure under my direct supervision. COMPLICATIONS: None immediate. TECHNIQUE: Informed written consent was obtained from the patient after a discussion of the risks, benefits and alternatives to treatment. Questions regarding the procedure were encouraged and answered. A timeout was performed prior to the initiation of the procedure. The right flank and external portion of existing nephrostomy catheter were prepped and draped in the usual sterile fashion. A sterile drape was applied covering the operative field. Maximum barrier sterile technique with sterile gowns and gloves were used for the procedure. A timeout was performed prior to the initiation of the procedure. A pre procedural spot fluoroscopic image was obtained after contrast was injected via the existing nephrostomy catheter demonstrating appropriate positioning within the renal pelvis. The existing nephrostomy catheter was cut and cannulated with a Benson wire which was coiled within the  renal pelvis. Under intermittent fluoroscopic guidance, the existing nephrostomy catheter was exchanged for a new 10.2 [place] all-purpose drainage catheter. Contrast injection confirmed appropriate positioning within the renal pelvis and a post exchange fluoroscopic image was obtained. The catheter was locked and secured to the skin with a suture. A dressing was placed. The patient tolerated the procedure well without immediate postprocedural complication. FINDINGS: The existing nephrostomy catheter is appropriately positioned and functioning. After successful fluoroscopic guided exchange, the new nephrostomy catheter is coiled and locked within the right renal pelvis. Note is again made of a nonocclusive filling defect within the  caudal aspect of the right renal pelvis, similar to prior nephrostomy catheter exchange performed 06/08/2017 though without a CT correlate on either the 9/2 or the 7/18 examinations. IMPRESSION: 1. Successful fluoroscopic guided exchange of right sided 10.2 French percutaneous nephrostomy catheter. 2. Note is again made of a nonocclusive filling defect in caudal aspect the right renal pelvis, similar to prior nephrostomy catheter exchange performed 06/08/2017, though without a CT correlate on either the 7/18 or the 9/2 examinations. Differential considerations include an air bubble, chronic thrombus or (less likely) a discrete collecting system lesion. Continued attention on follow-up is recommended. Electronically Signed   By: Sandi Mariscal M.D.   On: 08/17/2017 08:44     Assessment and plan- Patient is a 51 y.o. female presents for nausea/vomiting for the past three days and worsening abdominal pain. Right-sided nephrostomy tube in place and patent. Abdominal assessment reveals RUQ, epigastric and LLQ pain. No rebound tenderness. She is sobbing in pain during the assessment. Hypoactive bowel sounds heard. Lungs are clear. She is hyponatremic (134). She is anemic (10.5). Creatinine is elevated at 3.31. BUN elevated at 42.  1. Labs today (CBC, CMP, urine culture and UA). UA positive.  2. Acute kidney failure: 1 liter Fluids today. Right Nephrotomy tube patent. Urology consult inpatient.  3. Nausea/Vomiting: IV Zofran and Decadron today.  4. Malnutrition: She is down 13 lbs in 2 weeks. Needs inpatient dietician referral. 5. Abdominal Pain: IV Morphine during infusion. STAT CT abdomen. This will now be done inpatient.  6. Direct admit to hospital. [name] will be consulted.    Visit Diagnosis 1. Right upper quadrant abdominal pain   2. Intractable vomiting with nausea, unspecified vomiting type   3. Malnutrition, unspecified type [facility])     Patient expressed understanding and was in agreement with  this plan. She also understands that She can call clinic at any time with any questions, concerns, or complaints.    Marisue Humble [facility] Pager- [phone] 09/11/2017 2:33 PM

## 2017-09-10 NOTE — Progress Notes (Signed)
08/29/2017 4:53 PM   Patricia Rojas 1966-11-24 [id number]  Referring provider: No referring provider defined for this encounter.  No chief complaint on file.   HPI: 51 year old female with right hydronephrosis secondary to stage IIIB cervical cancer managed with right PCN.  Internalization to double-J ureteral stent was discussed, but she is concerned about pain and bladder symptoms related to an indwelling stent.  She is managed Dr. Fransisca Connors, [name], and Dr. Baruch Gouty receiving ciplatin, radiation, and brachytherapy at [facility].  Most recent PET scan 2 on 07/25/2017 shows persistent left DG uptake in the cervix concerning for residual disease.  She understands from her oncologist that she will likely need additional treatment.  He underwent attempted a clamping trial which was unsuccessful in 06/2017 . She developed leakage around the tube, severe pain requiring the tube to be unclamped. She subsequently was admitted twice for pyelonephritis. She is now under the care of Dr. Ola Spurr. She is a chronic daily antibiotics for infection suppression.  Her creatinine remains elevated to 1.40 (9/19)  from her previous baseline of 0.68 one year ago.    Most recently, she was readmitted with right lower quadrant pain of unclear etiology on 08/14/17 presumably related to her malignancy. At that time, her nephrostomy tube appeared to be in good position. She did undergo subsequent exchange of her nephrostomy tube on 08/17/2017 as planned.   PMH: Past Medical History:  Diagnosis Date  . Cancer (HCC)    cervical  . Constipation   . Hemorrhoids   . N&V (nausea and vomiting)   . PONV (postoperative nausea and vomiting)     Surgical History: Past Surgical History:  Procedure Laterality Date  . CESAREAN SECTION  1986  . CHOLECYSTECTOMY    . HERNIA REPAIR    . IR GENERIC HISTORICAL  02/23/2017   IR NEPHROSTOMY PLACEMENT RIGHT 02/23/2017 [name], MD [facility]INTER[facility]  . IR NEPHROSTOMY  EXCHANGE RIGHT  04/18/2017  . IR NEPHROSTOMY EXCHANGE RIGHT  06/08/2017  . IR NEPHROSTOMY EXCHANGE RIGHT  08/17/2017    Home Medications:  Allergies as of 08/29/2017   No Known Allergies     Medication List       Accurate as of 08/29/17 11:59 PM. Always use your most recent med list.          ALPRAZolam 0.25 MG tablet Commonly known as:  XANAX Take 1-2 tabs daily as needed   amLODipine 2.5 MG tablet Commonly known as:  NORVASC Take 1 tablet (2.5 mg total) by mouth daily.   nitrofurantoin 100 MG capsule Commonly known as:  MACRODANTIN Take 100 mg by mouth daily.   Oxycodone HCl 10 MG Tabs Take 1 tablet (10 mg total) by mouth 2 (two) times daily.   prochlorperazine 10 MG tablet Commonly known as:  COMPAZINE Take 1 tablet (10 mg total) by mouth every 6 (six) hours as needed for nausea or vomiting.   promethazine 25 MG suppository Commonly known as:  PHENERGAN Place 1 suppository (25 mg total) rectally every 6 (six) hours as needed for nausea or vomiting.       Allergies: No Known Allergies  Family History: Family History  Problem Relation Age of Onset  . Cancer Maternal Uncle   . Cancer Paternal Aunt   . Kidney cancer Neg Hx   . Bladder Cancer Neg Hx     Social History:  reports that she quit smoking about 28 years ago. Her smoking use included Cigarettes. She has never used smokeless tobacco. She  reports that she does not drink alcohol or use drugs.  ROS: UROLOGY Frequent Urination?: No Hard to postpone urination?: No Burning/pain with urination?: No Get up at night to urinate?: No Leakage of urine?: No Urine stream starts and stops?: No Trouble starting stream?: No Do you have to strain to urinate?: No Blood in urine?: No Urinary tract infection?: No Sexually transmitted disease?: No Injury to kidneys or bladder?: No Painful intercourse?: No Weak stream?: No Currently pregnant?: No Vaginal bleeding?: No Last menstrual period?:  n  Gastrointestinal Nausea?: No Vomiting?: No Indigestion/heartburn?: No Diarrhea?: No Constipation?: No  Constitutional Fever: No Night sweats?: No Weight loss?: No Fatigue?: No  Skin Skin rash/lesions?: No Itching?: No  Eyes Blurred vision?: No Double vision?: No  Ears/Nose/Throat Sore throat?: No Sinus problems?: No  Hematologic/Lymphatic Swollen glands?: No Easy bruising?: No  Cardiovascular Leg swelling?: No Chest pain?: No  Respiratory Cough?: No Shortness of breath?: No  Endocrine Excessive thirst?: No  Musculoskeletal Back pain?: No Joint pain?: No  Neurological Headaches?: No Dizziness?: No  Psychologic Depression?: No Anxiety?: No  Physical Exam: BP 139/90 (BP Location: Left Arm, Patient Position: Sitting, Cuff Size: Large)   Pulse 86   Ht 5\' 5"  (1.651 m)   Wt 249 lb (112.9 kg)   LMP 02/28/2017 (Approximate)   BMI 41.44 kg/m   Constitutional:  Alert and oriented, No acute distress.  Tearful  again as on previous  HEENT: [place] AT, moist mucus membranes.  Trachea midline, no masses. Cardiovascular: No clubbing, cyanosis, or edema. Respiratory: Normal respiratory effort, no increased work of breathing. GI: Abdomen is soft, nontender, nondistended, no abdominal masses.  Morbidly obese. GU: Right nephrostomy tube in place draining clear yellow urine.  Skin: No rashes, bruises or suspicious lesions. Neurologic: Grossly intact, no focal deficits, moving all 4 extremities. Psychiatric: Normal mood and affect.  Laboratory Data: Lab Results  Component Value Date   WBC 7.9 09/10/2017   HGB 10.5 (L) 09/10/2017   HCT 30.6 (L) 09/10/2017   MCV 77.7 (L) 09/10/2017   PLT 368 09/10/2017    Lab Results  Component Value Date   CREATININE 3.31 (H) 09/10/2017    Urinalysis N/a  Pertinent Imaging: n/a  Assessment & Plan:    1. Hydronephrosis of right kidney Moderate to severe right hydroureteronephrosis down to the level of the pelvis  secondary to obstructing tumor Managed with right nephrostomy tube- will arrange for next exchange on q6 week basis given concern for infections Not interested in internalization  F/u with MD after next PET scan  2. Acute renal failure, unspecified acute renal failure type ([place]) Initially thought to be secondary to obstruction Minimal improvement with tube ? Nephrotoxic chemo  3. Cervical cancer, FIGO stage IIIB ([place]) Additional treatment planned  Return in about 3 months (around 11/28/2017) for f/u PET.   Hollice Espy, MD  [facility] [address], [place], [place] [address] [phone]

## 2017-09-10 NOTE — Consult Note (Signed)
Consult: Acute renal failure Requested by: Dr. Gardiner Coins   History of Present Illness: This is a 51 year old white female with right hydronephrosis secondary to stage IIIB cervical cancer managed with right PCNx.  Internalization to double-J ureteral stent was discussed, but she is concerned about pain and bladder symptoms related to an indwelling stent. She was admitted for right abdominal pain and nausea and vomiting for 3 days duration. She said she can't keep anything down. Her Cr went up from ~1.2 range to 3.31. WBC is  7.9.  UA showed rate bacteria with tntc rbc's and wbc's. She continues to make clear urine from the right nephrostomy and continues to void clear urine without dysuria or gross hematuria. She underwent a CT scan this evening and I reviewed the images. This revealed right nephrostomy tube in good position without right hydronephrosis. Left kidney appeared normal with a delicate left collecting system without hydronephrosis. The bladder appeared normal. Official read pending. The nephrostomy tube was exchanged 08/17/2017.  From Dr. Cherrie Gauze last note: she underwent attempted a clamping trial which was unsuccessful in 06/2017 . She developed leakage around the tube, severe pain requiring the tube to be unclamped. She subsequently was admitted twice for pyelonephritis. She is now under the care of Dr. Ola Spurr. She is a chronic daily antibiotics for infection suppression.  Her previous baseline Cr was 0.68 one year ago.      Past Medical History:  Diagnosis Date  . Cancer (HCC)    cervical  . Constipation   . Hemorrhoids   . N&V (nausea and vomiting)   . PONV (postoperative nausea and vomiting)    Past Surgical History:  Procedure Laterality Date  . CESAREAN SECTION  1986  . CHOLECYSTECTOMY    . HERNIA REPAIR    . IR GENERIC HISTORICAL  02/23/2017   IR NEPHROSTOMY PLACEMENT RIGHT 02/23/2017 [name], MD [facility]INTER[facility]  . IR NEPHROSTOMY EXCHANGE RIGHT  04/18/2017  . IR  NEPHROSTOMY EXCHANGE RIGHT  06/08/2017  . IR NEPHROSTOMY EXCHANGE RIGHT  08/17/2017    Home Medications:  Prescriptions Prior to Admission  Medication Sig Dispense Refill Last Dose  . ALPRAZolam (XANAX) 0.25 MG tablet Take 1-2 tabs daily as needed 60 tablet 0 Past Week at Unknown time  . amLODipine (NORVASC) 2.5 MG tablet Take 1 tablet (2.5 mg total) by mouth daily. 30 tablet 0 Past Week at Unknown time  . nitrofurantoin (MACRODANTIN) 100 MG capsule Take 100 mg by mouth daily.   Past Week at Unknown time  . Oxycodone HCl 10 MG TABS Take 1 tablet (10 mg total) by mouth 2 (two) times daily. 60 tablet 0 09/09/2017 at Unknown time  . prochlorperazine (COMPAZINE) 10 MG tablet Take 1 tablet (10 mg total) by mouth every 6 (six) hours as needed for nausea or vomiting. 30 tablet 1 Past Week at Unknown time  . promethazine (PHENERGAN) 25 MG suppository Place 1 suppository (25 mg total) rectally every 6 (six) hours as needed for nausea or vomiting. 12 each 1 Past Week at Unknown time   Allergies: No Known Allergies  Family History  Problem Relation Age of Onset  . Cancer Maternal Uncle   . Cancer Paternal Aunt   . Kidney cancer Neg Hx   . Bladder Cancer Neg Hx    Social History:  reports that she quit smoking about 28 years ago. Her smoking use included Cigarettes. She has never used smokeless tobacco. She reports that she does not drink alcohol or use drugs.  ROS: A complete review of systems was performed.  All systems are negative except for pertinent findings as noted. ROS   Physical Exam:  Vital signs in last 24 hours: Temp:  [97.6 F (36.4 C)-98.2 F (36.8 C)] 98.2 F (36.8 C) (10/01 1615) Pulse Rate:  [80-83] 83 (10/01 1615) Resp:  [16-20] 16 (10/01 1615) BP: (132-149)/(82-90) 149/90 (10/01 1615) SpO2:  [98 %] 98 % (10/01 1615) Weight:  [103.6 kg (228 lb 8 oz)-107.2 kg (236 lb 6.4 oz)] 103.6 kg (228 lb 8 oz) (10/01 1640) General:  Alert and oriented, No acute distress -- Nurse was  chaperone HEENT: Normocephalic, atraumatic Neck: No JVD or lymphadenopathy Cardiovascular: Regular rate and rhythm Lungs: Regular rate and effort Abdomen: Soft, nontender, nondistended, no abdominal masses Back: No CVA tenderness, right Nx with clear urine  Extremities: No edema Neurologic: Grossly intact  Laboratory Data:  Results for orders placed or performed in visit on 09/10/17 (from the past 24 hour(s))  Urinalysis, Complete w Microscopic     Status: Abnormal   Collection Time: 09/10/17  1:53 PM  Result Value Ref Range   Color, Urine AMBER (A) YELLOW   APPearance CLOUDY (A) CLEAR   Specific Gravity, Urine 1.023 1.005 - 1.030   pH 5.0 5.0 - 8.0   Glucose, UA NEGATIVE NEGATIVE mg/dL   Hgb urine dipstick SMALL (A) NEGATIVE   Bilirubin Urine NEGATIVE NEGATIVE   Ketones, ur 5 (A) NEGATIVE mg/dL   Protein, ur 100 (A) NEGATIVE mg/dL   Nitrite NEGATIVE NEGATIVE   Leukocytes, UA LARGE (A) NEGATIVE   RBC / HPF TOO NUMEROUS TO COUNT 0 - 5 RBC/hpf   WBC, UA TOO NUMEROUS TO COUNT 0 - 5 WBC/hpf   Bacteria, UA RARE (A) NONE SEEN   Squamous Epithelial / LPF 0-5 (A) NONE SEEN   WBC Clumps PRESENT    Mucus PRESENT   CBC with Differential/Platelet     Status: Abnormal   Collection Time: 09/10/17  2:05 PM  Result Value Ref Range   WBC 7.9 3.6 - 11.0 K/uL   RBC 3.94 3.80 - 5.20 MIL/uL   Hemoglobin 10.5 (L) 12.0 - 16.0 g/dL   HCT 30.6 (L) 35.0 - 47.0 %   MCV 77.7 (L) 80.0 - 100.0 fL   MCH 26.7 26.0 - 34.0 pg   MCHC 34.3 32.0 - 36.0 g/dL   RDW 16.0 (H) 11.5 - 14.5 %   Platelets 368 150 - 440 K/uL   Neutrophils Relative % 80 %   Neutro Abs 6.3 1.4 - 6.5 K/uL   Lymphocytes Relative 11 %   Lymphs Abs 0.9 (L) 1.0 - 3.6 K/uL   Monocytes Relative 6 %   Monocytes Absolute 0.5 0.2 - 0.9 K/uL   Eosinophils Relative 2 %   Eosinophils Absolute 0.1 0 - 0.7 K/uL   Basophils Relative 1 %   Basophils Absolute 0.1 0 - 0.1 K/uL  Comprehensive metabolic panel     Status: Abnormal   Collection  Time: 09/10/17  2:05 PM  Result Value Ref Range   Sodium 134 (L) 135 - 145 mmol/L   Potassium 3.6 3.5 - 5.1 mmol/L   Chloride 100 (L) 101 - 111 mmol/L   CO2 23 22 - 32 mmol/L   Glucose, Bld 95 65 - 99 mg/dL   BUN 42 (H) 6 - 20 mg/dL   Creatinine, Ser 3.31 (H) 0.44 - 1.00 mg/dL   Calcium 9.7 8.9 - 10.3 mg/dL   Total Protein 9.2 (H) 6.5 -  8.1 g/dL   Albumin 3.8 3.5 - 5.0 g/dL   AST 17 15 - 41 U/L   ALT 12 (L) 14 - 54 U/L   Alkaline Phosphatase 92 38 - 126 U/L   Total Bilirubin 0.5 0.3 - 1.2 mg/dL   GFR calc non Af Amer 15 (L) >60 mL/min   GFR calc Af Amer 17 (L) >60 mL/min   Anion gap 11 5 - 15   No results found for this or any previous visit (from the past 240 hour(s)). Creatinine:  Recent Labs  09/10/17 1405  CREATININE 3.31*    Impression/Assessment/plan: -ARF and Right sided abdominal pain with N/V - there does not appear to be any urologic issue. The right sided nephrostomy tube appears to be working normally and there is no hydronephrosis on the CT scan. Will sign off, but please notify urology service or urology on call with any changes in patient status, questions or concerns.  Emmylou Bieker 09/10/2017, 8:25 PM

## 2017-09-10 NOTE — H&P (Signed)
[place] [facility] [place] NAME: Patricia Rojas    MR#:  [id number]  DATE OF BIRTH:  15-Jul-1966  DATE OF ADMISSION:  09/10/2017  PRIMARY CARE PHYSICIAN: [name], MD   REQUESTING/REFERRING PHYSICIAN:  Dr Caleen Essex  CHIEF COMPLAINT:    Nausea and vomiting HISTORY OF PRESENT ILLNESS:  Patricia Rojas  is a 51 y.o. female with a known history of Squamous cell carcinoma of cervix and right hydronephrosis status post nephrostomy tube who presented to Dr. Ola Spurr office for routine visit. Patient was complaining of nausea and vomiting and had recommended to see the oncologist. Labs are drawn and patient's creatinine is 3.3 from baseline 1.4-1.7..  PAST MEDICAL HISTORY:   Past Medical History:  Diagnosis Date  . Cancer (HCC)    cervical  . Constipation   . Hemorrhoids   . N&V (nausea and vomiting)   . PONV (postoperative nausea and vomiting)     PAST SURGICAL HISTORY:   Past Surgical History:  Procedure Laterality Date  . CESAREAN SECTION  1986  . CHOLECYSTECTOMY    . HERNIA REPAIR    . IR GENERIC HISTORICAL  02/23/2017   IR NEPHROSTOMY PLACEMENT RIGHT 02/23/2017 [name], MD [facility]INTER[facility]  . IR NEPHROSTOMY EXCHANGE RIGHT  04/18/2017  . IR NEPHROSTOMY EXCHANGE RIGHT  06/08/2017  . IR NEPHROSTOMY EXCHANGE RIGHT  08/17/2017    SOCIAL HISTORY:   Social History  Substance Use Topics  . Smoking status: Former Smoker    Types: Cigarettes    Quit date: 29  . Smokeless tobacco: Never Used  . Alcohol use No    FAMILY HISTORY:   Family History  Problem Relation Age of Onset  . Cancer Maternal Uncle   . Cancer Paternal Aunt   . Kidney cancer Neg Hx   . Bladder Cancer Neg Hx     DRUG ALLERGIES:  No Known Allergies  REVIEW OF SYSTEMS:   Review of Systems  Constitutional: Positive for malaise/fatigue. Negative for chills and fever.  HENT: Negative.  Negative for ear discharge, ear pain, hearing loss, nosebleeds and sore  throat.   Eyes: Negative.  Negative for blurred vision and pain.  Respiratory: Negative.  Negative for cough, hemoptysis, shortness of breath and wheezing.   Cardiovascular: Negative.  Negative for chest pain, palpitations and leg swelling.  Gastrointestinal: Positive for abdominal pain, nausea and vomiting. Negative for blood in stool and diarrhea.  Genitourinary: Negative.  Negative for dysuria.  Musculoskeletal: Negative.  Negative for back pain.  Skin: Negative.   Neurological: Positive for weakness. Negative for dizziness, tremors, speech change, focal weakness, seizures and headaches.  Endo/Heme/Allergies: Negative.  Does not bruise/bleed easily.  Psychiatric/Behavioral: Negative.  Negative for depression, hallucinations and suicidal ideas.    MEDICATIONS AT HOME:   Prior to Admission medications   Medication Sig Start Date End Date Taking? Authorizing Provider  ALPRAZolam Duanne Moron) 0.25 MG tablet Take 1-2 tabs daily as needed Patient not taking: Reported on 09/10/2017 02/22/17   Lloyd Huger, MD  amLODipine (NORVASC) 2.5 MG tablet Take 1 tablet (2.5 mg total) by mouth daily. Patient not taking: Reported on 09/10/2017 08/16/17   Max Sane, MD  nitrofurantoin (MACRODANTIN) 100 MG capsule Take 100 mg by mouth daily.    [provider]  Oxycodone HCl 10 MG TABS Take 1 tablet (10 mg total) by mouth 2 (two) times daily. Patient not taking: Reported on 09/10/2017 08/29/17   Lloyd Huger, MD  prochlorperazine (COMPAZINE) 10  MG tablet Take 1 tablet (10 mg total) by mouth every 6 (six) hours as needed for nausea or vomiting. Patient not taking: Reported on 09/10/2017 08/16/17   Lloyd Huger, MD  promethazine (PHENERGAN) 25 MG suppository Place 1 suppository (25 mg total) rectally every 6 (six) hours as needed for nausea or vomiting. 08/16/17   Lloyd Huger, MD      VITAL SIGNS:  Height 5\' 5"  (1.651 m), weight 103.6 kg (228 lb 8 oz), last menstrual period  02/28/2017.  PHYSICAL EXAMINATION:   Physical Exam  Constitutional: She is oriented to person, place, and time and well-developed, well-nourished, and in no distress. No distress.  HENT:  Head: Normocephalic.  Eyes: No scleral icterus.  Neck: Normal range of motion. Neck supple. No JVD present. No tracheal deviation present.  Cardiovascular: Normal rate, regular rhythm and normal heart sounds.  Exam reveals no gallop and no friction rub.   No murmur heard. Pulmonary/Chest: Effort normal and breath sounds normal. No respiratory distress. She has no wheezes. She has no rales. She exhibits no tenderness.  Abdominal: Soft. Bowel sounds are normal. She exhibits no distension and no mass. There is tenderness. There is no rebound and no guarding.  Nephrostomy tube  Musculoskeletal: Normal range of motion. She exhibits no edema.  Neurological: She is alert and oriented to person, place, and time.  Skin: Skin is warm. No rash noted. No erythema.  Psychiatric: Affect and judgment normal.      LABORATORY PANEL:   CBC  Recent Labs Lab 09/10/17 1405  WBC 7.9  HGB 10.5*  HCT 30.6*  PLT 368   ------------------------------------------------------------------------------------------------------------------  Chemistries   Recent Labs Lab 09/10/17 1405  NA 134*  K 3.6  CL 100*  CO2 23  GLUCOSE 95  BUN 42*  CREATININE 3.31*  CALCIUM 9.7  AST 17  ALT 12*  ALKPHOS 92  BILITOT 0.5   ------------------------------------------------------------------------------------------------------------------  Cardiac Enzymes No results for input(s): TROPONINI in the last 168 hours. ------------------------------------------------------------------------------------------------------------------  RADIOLOGY:  No results found.  EKG:   Orders placed or performed during the hospital encounter of 06/27/17  . ED EKG  . ED EKG  . ED EKG 12-Lead  . ED EKG 12-Lead    IMPRESSION AND PLAN:     51 year old female with squamous cell carcinoma the cervix and right nephrostomy tube due to hydronephrosis who presents with nausea and vomiting and abdominal pain.  1. Acute kidney injury: This could be in the setting of nausea and vomiting or in the setting of new hydronephrosis Discussed case with urologist CT of the abdomen noncontrast ordered Start IV fluids Nephrology and urology consultation requested Follow BMP Hold nephrotoxic medications  2. Acute on chronic abdominal pain: Order KUB Follow-up on CT scan noncontrast When necessary pain medications  3. Nausea and vomiting: Continue supportive management Continue IV fluids 4. Right nephrostomy tube: Urology consultation requested and discussed case with urologist.  5. Urinary tract infection: Start Levaquin as per sensitivities in the past ID consultation requested  6. Squamous cell carcinoma the cervix: Oncology consultation requested   All the records are reviewed and case discussed with ED provider. Management plans discussed with the patient and she is in agreement  CODE STATUS: full  TOTAL TIME TAKING CARE OF THIS PATIENT: 45 minutes.    Jaiya Mooradian M.D on 09/10/2017 at 4:49 PM  Between 7am to 6pm - Pager - [phone]  After 6pm go to [email or web address] - password [email or web address]  [employer]  Office  [phone]  CC: Primary care physician; [name], MD

## 2017-09-10 NOTE — Progress Notes (Signed)
Dr. Benjie Karvonen notified patient arrived to unit as direct admit from cancer center. C/o abdominal pain 10/10. See new order.

## 2017-09-10 NOTE — Progress Notes (Signed)
Pharmacist - Prescriber Communication  Enoxaparin dose modified from 40 mg subcutaneously once daily to 30 mg subcutaneously once daily due to creatinine clearance less than 30 mL/min.   Anetria Harwick A. [place], [place].D., BCPS Clinical Pharmacist 09/10/2017 16:51

## 2017-09-10 NOTE — Progress Notes (Signed)
Pharmacy Antibiotic Note  Patricia Rojas is a 51 y.o. female admitted on 09/10/2017 with UTI.  Pharmacy has been consulted for levofloxacin dosing.  Plan: Levofloxacin 750 mg IV Q48H  Height: 5\' 5"  (165.1 cm) Weight: 228 lb 8 oz (103.6 kg) IBW/kg (Calculated) : 57  Temp (24hrs), Avg:97.6 F (36.4 C), Min:97.6 F (36.4 C), Max:97.6 F (36.4 C)   Recent Labs Lab 09/10/17 1405  WBC 7.9  CREATININE 3.31*    Estimated Creatinine Clearance: 24 mL/min (A) (by C-G formula based on SCr of 3.31 mg/dL (H)).    No Known Allergies  Thank you for allowing pharmacy to be a part of this patient's care.  Laural Benes, Pharm.D., BCPS Clinical Pharmacist 09/10/2017 4:58 PM

## 2017-09-11 DIAGNOSIS — Z7901 Long term (current) use of anticoagulants: Secondary | ICD-10-CM

## 2017-09-11 DIAGNOSIS — K649 Unspecified hemorrhoids: Secondary | ICD-10-CM

## 2017-09-11 DIAGNOSIS — F419 Anxiety disorder, unspecified: Secondary | ICD-10-CM

## 2017-09-11 DIAGNOSIS — R634 Abnormal weight loss: Secondary | ICD-10-CM

## 2017-09-11 DIAGNOSIS — R112 Nausea with vomiting, unspecified: Secondary | ICD-10-CM

## 2017-09-11 DIAGNOSIS — R109 Unspecified abdominal pain: Secondary | ICD-10-CM

## 2017-09-11 DIAGNOSIS — Z79899 Other long term (current) drug therapy: Secondary | ICD-10-CM

## 2017-09-11 DIAGNOSIS — R630 Anorexia: Secondary | ICD-10-CM

## 2017-09-11 DIAGNOSIS — Z809 Family history of malignant neoplasm, unspecified: Secondary | ICD-10-CM

## 2017-09-11 DIAGNOSIS — Z9221 Personal history of antineoplastic chemotherapy: Secondary | ICD-10-CM

## 2017-09-11 DIAGNOSIS — N390 Urinary tract infection, site not specified: Secondary | ICD-10-CM

## 2017-09-11 DIAGNOSIS — F329 Major depressive disorder, single episode, unspecified: Secondary | ICD-10-CM

## 2017-09-11 DIAGNOSIS — K5900 Constipation, unspecified: Secondary | ICD-10-CM

## 2017-09-11 DIAGNOSIS — E860 Dehydration: Secondary | ICD-10-CM

## 2017-09-11 DIAGNOSIS — N179 Acute kidney failure, unspecified: Secondary | ICD-10-CM

## 2017-09-11 DIAGNOSIS — C539 Malignant neoplasm of cervix uteri, unspecified: Secondary | ICD-10-CM

## 2017-09-11 DIAGNOSIS — Z87891 Personal history of nicotine dependence: Secondary | ICD-10-CM

## 2017-09-11 DIAGNOSIS — Z923 Personal history of irradiation: Secondary | ICD-10-CM

## 2017-09-11 LAB — URINALYSIS, ROUTINE W REFLEX MICROSCOPIC
BILIRUBIN URINE: NEGATIVE
Glucose, UA: NEGATIVE mg/dL
Ketones, ur: 5 mg/dL — AB
Nitrite: NEGATIVE
Protein, ur: NEGATIVE mg/dL
Specific Gravity, Urine: 1.014 (ref 1.005–1.030)
pH: 5 (ref 5.0–8.0)

## 2017-09-11 LAB — BASIC METABOLIC PANEL
Anion gap: 9 (ref 5–15)
BUN: 40 mg/dL — AB (ref 6–20)
CALCIUM: 9.2 mg/dL (ref 8.9–10.3)
CHLORIDE: 105 mmol/L (ref 101–111)
CO2: 22 mmol/L (ref 22–32)
CREATININE: 2.63 mg/dL — AB (ref 0.44–1.00)
GFR calc non Af Amer: 20 mL/min — ABNORMAL LOW (ref >60)
GFR, EST AFRICAN AMERICAN: 23 mL/min — AB (ref >60)
GLUCOSE: 113 mg/dL — AB (ref 65–99)
Potassium: 4.5 mmol/L (ref 3.5–5.1)
Sodium: 136 mmol/L (ref 135–145)

## 2017-09-11 LAB — CBC
HCT: 28.1 % — ABNORMAL LOW (ref 35.0–47.0)
Hemoglobin: 9.5 g/dL — ABNORMAL LOW (ref 12.0–16.0)
MCH: 26.9 pg (ref 26.0–34.0)
MCHC: 34 g/dL (ref 32.0–36.0)
MCV: 79.2 fL — AB (ref 80.0–100.0)
PLATELETS: 314 10*3/uL (ref 150–440)
RBC: 3.55 MIL/uL — AB (ref 3.80–5.20)
RDW: 15.8 % — ABNORMAL HIGH (ref 11.5–14.5)
WBC: 6.1 10*3/uL (ref 3.6–11.0)

## 2017-09-11 LAB — LIPASE, BLOOD: LIPASE: 23 U/L (ref 11–51)

## 2017-09-11 MED ORDER — ALPRAZOLAM 0.5 MG PO TABS: 0.2500 mg | ORAL_TABLET | Freq: Two times a day (BID) | ORAL | Status: DC | PRN

## 2017-09-11 MED ORDER — UNJURY CHICKEN SOUP POWDER: 2.0000 [oz_av] | Freq: Two times a day (BID) | ORAL | Status: DC

## 2017-09-11 MED ORDER — PIPERACILLIN-TAZOBACTAM 3.375 G IVPB
3.3750 g | Freq: Three times a day (TID) | INTRAVENOUS | Status: DC
  Administered 2017-09-11 – 2017-09-13 (×6): 3.375 g via INTRAVENOUS
  Filled 2017-09-11 (×9): qty 50

## 2017-09-11 MED ORDER — LACTULOSE 10 GM/15ML PO SOLN
30.0000 g | Freq: Two times a day (BID) | ORAL | Status: DC | PRN
  Administered 2017-09-11: 30 g via ORAL
  Filled 2017-09-11: qty 60

## 2017-09-11 MED ORDER — POLYETHYLENE GLYCOL 3350 17 G PO PACK
17.0000 g | PACK | Freq: Every day | ORAL | Status: DC
  Administered 2017-09-11 – 2017-09-13 (×2): 17 g via ORAL
  Filled 2017-09-11 (×2): qty 1

## 2017-09-11 MED ORDER — ALPRAZOLAM 0.5 MG PO TABS: 0.2500 mg | ORAL_TABLET | Freq: Two times a day (BID) | ORAL | Status: DC

## 2017-09-11 NOTE — Progress Notes (Signed)
Pharmacy Antibiotic Note  Patricia Rojas is a 51 y.o. female admitted on 73/06/1061 with complicated UTI.  Pharmacy has been consulted for Zosyn dosing.  Plan: Zosyn EI 3.375g IV Q8hr.    Height: 5\' 5"  (165.1 cm) Weight: 228 lb 8 oz (103.6 kg) IBW/kg (Calculated) : 57  Temp (24hrs), Avg:97.8 F (36.6 C), Min:97.5 F (36.4 C), Max:98.2 F (36.8 C)   Recent Labs Lab 09/10/17 1405 09/11/17 0459  WBC 7.9 6.1  CREATININE 3.31* 2.63*    Estimated Creatinine Clearance: 30.2 mL/min (A) (by C-G formula based on SCr of 2.63 mg/dL (H)).    No Known Allergies  Antimicrobials this admission: Levofloxacin 10/1 >> 10/1 Zosyn 10/2 >>   Dose adjustments this admission: N/A  Microbiology results: 10/2 UCx: pending   Thank you for allowing pharmacy to be a part of this patient's care.  Simpson,Michael L 09/11/2017 3:15 PM

## 2017-09-11 NOTE — Consult Note (Signed)
Date: 09/11/2017                  Patient Name:  Patricia Rojas  MRN: [id number]  DOB: Nov 17, 1966  Age / Sex: 51 y.o., female         PCP: [name], MD                 Service Requesting Consult: IM/ Epifanio Lesches, MD                 Reason for Consult: ARF            History of Present Illness: Patient is a 51 y.o. female with medical problems of Cervical Cancer, who was admitted to [facility] on 09/10/2017 for evaluation of severe nausea and vomiting and right flank pain.   Patient states that several days prior to admission, she was having severe nausea and vomiting and could not keep anything down. She has severe right groin pain that radiates to the nerve in the back of her leg Upon admission, she was noted to have elevated creatinine  Her baseline creatinine appears to be 1.14 from September 5. It has steadily been increasing since then. Creatinine peaked at 3.31 yesterday. Today's level is slightly improved at 2.63  Patient has underlying cervical cancer (Squamous cell carcinoma) which has infiltrated in the paracervical fat to include the right distal ureter. She has a right nephrostomy tube. He was treated with chemotherapy and radiation in April 2018  Noncontrast CT of the abdomen continues to show ill-defined cervical mass and is negative for hydronephrosis. Percutaneous nephrostomy tube is in the appropriate position   Medications: Outpatient medications: Prescriptions Prior to Admission  Medication Sig Dispense Refill Last Dose  . ALPRAZolam (XANAX) 0.25 MG tablet Take 1-2 tabs daily as needed 60 tablet 0 Past Week at Unknown time  . amLODipine (NORVASC) 2.5 MG tablet Take 1 tablet (2.5 mg total) by mouth daily. 30 tablet 0 Past Week at Unknown time  . nitrofurantoin (MACRODANTIN) 100 MG capsule Take 100 mg by mouth daily.   Past Week at Unknown time  . Oxycodone HCl 10 MG TABS Take 1 tablet (10 mg total) by mouth 2 (two) times daily. 60 tablet 0 09/09/2017 at  Unknown time  . prochlorperazine (COMPAZINE) 10 MG tablet Take 1 tablet (10 mg total) by mouth every 6 (six) hours as needed for nausea or vomiting. 30 tablet 1 Past Week at Unknown time  . promethazine (PHENERGAN) 25 MG suppository Place 1 suppository (25 mg total) rectally every 6 (six) hours as needed for nausea or vomiting. 12 each 1 Past Week at Unknown time    Current medications: Current Facility-Administered Medications  Medication Dose Route Frequency Provider Last Rate Last Dose  . 0.9 %  sodium chloride infusion   Intravenous Continuous Bettey Costa, MD 100 mL/hr at 09/11/17 0352    . acetaminophen (TYLENOL) tablet 650 mg  650 mg Oral Q6H PRN Bettey Costa, MD       Or  . acetaminophen (TYLENOL) suppository 650 mg  650 mg Rectal Q6H PRN Mody, Sital, MD      . bisacodyl (DULCOLAX) EC tablet 5 mg  5 mg Oral Daily PRN Mody, Sital, MD      . enoxaparin (LOVENOX) injection 30 mg  30 mg Subcutaneous Q24H Mody, Sital, MD      . HYDROcodone-acetaminophen (NORCO/VICODIN) 5-325 MG per tablet 1-2 tablet  1-2 tablet Oral Q4H PRN Bettey Costa, MD   1  tablet at 09/11/17 0859  . HYDROmorphone (DILAUDID) injection 0.5 mg  0.5 mg Intravenous Q3H PRN Bettey Costa, MD   0.5 mg at 09/11/17 0731  . levofloxacin (LEVAQUIN) IVPB 750 mg  750 mg Intravenous Q48H Bettey Costa, MD   Stopped at 09/10/17 2154  . ondansetron (ZOFRAN) tablet 4 mg  4 mg Oral Q6H PRN Bettey Costa, MD       Or  . ondansetron (ZOFRAN) injection 4 mg  4 mg Intravenous Q6H PRN Bettey Costa, MD   4 mg at 09/11/17 0731  . promethazine (PHENERGAN) injection 25 mg  25 mg Intravenous Q6H PRN Harrie Foreman, MD   25 mg at 09/11/17 0346  . senna-docusate (Senokot-S) tablet 1 tablet  1 tablet Oral QHS PRN Bettey Costa, MD          Allergies: No Known Allergies    Past Medical History: Past Medical History:  Diagnosis Date  . Cancer (HCC)    cervical  . Constipation   . Hemorrhoids   . N&V (nausea and vomiting)   . PONV (postoperative  nausea and vomiting)      Past Surgical History: Past Surgical History:  Procedure Laterality Date  . CESAREAN SECTION  1986  . CHOLECYSTECTOMY    . HERNIA REPAIR    . IR GENERIC HISTORICAL  02/23/2017   IR NEPHROSTOMY PLACEMENT RIGHT 02/23/2017 [name], MD [facility]-INTERV RAD  . IR NEPHROSTOMY EXCHANGE RIGHT  04/18/2017  . IR NEPHROSTOMY EXCHANGE RIGHT  06/08/2017  . IR NEPHROSTOMY EXCHANGE RIGHT  08/17/2017     Family History: Family History  Problem Relation Age of Onset  . Cancer Maternal Uncle   . Cancer Paternal Aunt   . Kidney cancer Neg Hx   . Bladder Cancer Neg Hx      Social History: Social History   Social History  . Marital status: Single    Spouse name: N/A  . Number of children: N/A  . Years of education: N/A   Occupational History  . Not on file.   Social History Main Topics  . Smoking status: Former Smoker    Types: Cigarettes    Quit date: 70  . Smokeless tobacco: Never Used  . Alcohol use No  . Drug use: No     Comment: stopped 3 weeks ago  . Sexual activity: Not on file   Other Topics Concern  . Not on file   Social History Narrative  . No narrative on file     Review of Systems: Gen: No fevers or chills HEENT: No vision or hearing problems CV: No chest pain or shortness of breath Resp: No cough or sputum production GI: Severe nausea, vomiting. No appetite GU : Nephrostomy tube on the right MS: Right flank pain radiating towards groin Derm:  No complaints Psych: No complaints Heme: No complaints Neuro: No complaints Endocrine: No complaints  Vital Signs: Blood pressure 120/70, pulse 79, temperature 97.6 F (36.4 C), temperature source Oral, resp. rate 20, height 5\' 5"  (1.651 m), weight 103.6 kg (228 lb 8 oz), last menstrual period 02/28/2017, SpO2 97 %.   Intake/Output Summary (Last 24 hours) at 09/11/17 1017 Last data filed at 09/11/17 0944  Gross per 24 hour  Intake          1331.67 ml  Output                0 ml   Net          1331.67 ml  Weight trends: Filed Weights   09/10/17 1640  Weight: 103.6 kg (228 lb 8 oz)    Physical Exam: General:  Some distress from the pain, laying in the bed   HEENT Anicteric, moist oral mucous membranes   Neck:  Supple, no distended neck veins   Lungs: Clear to auscultation, normal breathing effort   Heart::  Regular, soft systolic murmur   Abdomen: Soft, mild right lower quadrant tenderness and suprapubic tenderness   Extremities:  No peripheral edema   Neurologic: Alert, oriented   Skin: No acute rashes              Lab results: Basic Metabolic Panel:  Recent Labs Lab 09/10/17 1405 09/11/17 0459  NA 134* 136  K 3.6 4.5  CL 100* 105  CO2 23 22  GLUCOSE 95 113*  BUN 42* 40*  CREATININE 3.31* 2.63*  CALCIUM 9.7 9.2    Liver Function Tests:  Recent Labs Lab 09/10/17 1405  AST 17  ALT 12*  ALKPHOS 92  BILITOT 0.5  PROT 9.2*  ALBUMIN 3.8   No results for input(s): LIPASE, AMYLASE in the last 168 hours. No results for input(s): AMMONIA in the last 168 hours.  CBC:  Recent Labs Lab 09/10/17 1405 09/11/17 0459  WBC 7.9 6.1  NEUTROABS 6.3  --   HGB 10.5* 9.5*  HCT 30.6* 28.1*  MCV 77.7* 79.2*  PLT 368 314    Cardiac Enzymes: No results for input(s): CKTOTAL, TROPONINI in the last 168 hours.  BNP: Invalid input(s): POCBNP  CBG: No results for input(s): GLUCAP in the last 168 hours.  Microbiology: Recent Results (from the past 720 hour(s))  Urine Culture     Status: Abnormal   Collection Time: 08/23/17  7:45 PM  Result Value Ref Range Status   Specimen Description KIDNEY  Final   Special Requests nephrostomy Normal  Final   Culture (A)  Final    >=100,000 COLONIES/mL DIPHTHEROIDS(CORYNEBACTERIUM SPECIES) Standardized susceptibility testing for this organism is not available. Performed at [facility], [place] [address] [place], [place] [address]    Report Status 08/26/2017 FINAL  Final     Coagulation  Studies: No results for input(s): LABPROT, INR in the last 72 hours.  Urinalysis:  Recent Labs  09/10/17 1353  COLORURINE AMBER*  LABSPEC 1.023  PHURINE 5.0  GLUCOSEU NEGATIVE  HGBUR SMALL*  BILIRUBINUR NEGATIVE  KETONESUR 5*  PROTEINUR 100*  NITRITE NEGATIVE  LEUKOCYTESUR LARGE*        Imaging: Ct Abdomen Pelvis Wo Contrast  Result Date: 09/10/2017 CLINICAL DATA:  Right lower quadrant pain.  Cervical carcinoma. EXAM: CT ABDOMEN AND PELVIS WITHOUT CONTRAST TECHNIQUE: Multidetector CT imaging of the abdomen and pelvis was performed following the standard protocol without IV contrast. COMPARISON:  08/12/2017 FINDINGS: Lower chest: No acute findings. Prior cholecystectomy. No evidence of biliary dilatation. Hepatobiliary:  No mass visualized on this unenhanced exam. Pancreas: No mass or inflammatory process visualized on this unenhanced exam. Spleen:  Within normal limits in size. Adrenals/Urinary tract: Right-sided percutaneous nephrostomy tube remains in appropriate position. No evidence of hydronephrosis. Stomach/Bowel: No evidence of obstruction, inflammatory process, or abnormal fluid collections. Diverticulosis mainly involving the descending sigmoid colon, without evidence of diverticulitis. Vascular/Lymphatic: No pathologically enlarged lymph nodes identified. No evidence of abdominal aortic aneurysm. Aortic atherosclerosis. Reproductive: Stable ill-defined cervical mass extending into right parametrial soft tissues. No other pelvic or adnexal mass identified. No evidence of free fluid. Other:  Stable small paraumbilical hernia containing only  fat. Musculoskeletal:  No suspicious bone lesions identified. IMPRESSION: No acute findings. Stable ill-defined cervical mass extending into right parametrial soft tissues. No new or progressive disease identified. Colonic diverticulosis, without radiographic evidence of diverticulitis. Electronically Signed   By: Earle Gell M.D.   On:  09/10/2017 21:29      Assessment & Plan: Pt is a 51 y.o.   female with squamous cell  cervical cancer, right hydronephrosis, right percutaneous nephrostomy tube, was admitted on 09/10/2017 with severe nausea, vomiting.   1. Acute renal failure 2.Chronic kidney disease stage III, baseline 1.14/GFR 55 from 08/15/2017 3. Right hydronephrosis secondary to infiltration from cervical cancer s/p Rt perc Nephrostomy  Acute renal failure is likely secondary to volume depletion CT scan of the abdomen is negative for obstruction and nephrostomy is in the appropriate position Today's serum creatinine has improved compared to yesterday  Plan: Continue IV fluids Encourage oral intake  pain management- May need to consider fentanyl patch Repeat urinalysis and urine culture  Will follow

## 2017-09-11 NOTE — Consult Note (Signed)
[place]  [phone] [phone]  ID: Patricia Rojas OB: 10/04/1966  MR#: [id number]  YSA#:[id number]  Patient Care Team: [name], MD as PCP - General (Infectious Diseases) Clent Jacks, RN as Registered Nurse  CHIEF COMPLAINT: Intractable nausea and vomiting, dehydration, acute renal failure, history of squamous cell cervix.  INTERVAL HISTORY: Patient is a 51 year old female who completed concurrent chemotherapy and XRT for squamous cell carcinoma cervix in April 2018. She then completed brachytherapy at [facility]. Patient has noted increasing pain over the past month. She has had intermittent nausea and vomiting as well. Patient states her nausea vomiting came significantly worse over the past week with minimal PO intake. Upon further evaluation she was noted to be in acute renal failure. Currently, she has no neurologic complaints. She denies any fevers. She has a poor appetite. She denies any chest pain or shortness of breath. She continues to have abdominal pain she has no urinary complaints. Patient generally feels terrible, but offers no further specific complaints.  REVIEW OF SYSTEMS:   Review of Systems  Constitutional: Positive for malaise/fatigue and weight loss. Negative for fever.  Respiratory: Negative.  Negative for cough and shortness of breath.   Cardiovascular: Negative.  Negative for chest pain and leg swelling.  Gastrointestinal: Positive for abdominal pain, constipation, nausea and vomiting. Negative for blood in stool and melena.  Genitourinary: Negative.  Negative for hematuria.  Musculoskeletal: Negative.   Skin: Negative.  Negative for rash.  Neurological: Positive for weakness.  Psychiatric/Behavioral: Positive for depression. The patient is nervous/anxious.     As per HPI. Otherwise, a complete review of systems is negative.  PAST MEDICAL HISTORY: Past Medical History:  Diagnosis Date  . Cancer  (HCC)    cervical  . Constipation   . Hemorrhoids   . N&V (nausea and vomiting)   . PONV (postoperative nausea and vomiting)     PAST SURGICAL HISTORY: Past Surgical History:  Procedure Laterality Date  . CESAREAN SECTION  1986  . CHOLECYSTECTOMY    . HERNIA REPAIR    . IR GENERIC HISTORICAL  02/23/2017   IR NEPHROSTOMY PLACEMENT RIGHT 02/23/2017 [name], MD [facility]-INTERV RAD  . IR NEPHROSTOMY EXCHANGE RIGHT  04/18/2017  . IR NEPHROSTOMY EXCHANGE RIGHT  06/08/2017  . IR NEPHROSTOMY EXCHANGE RIGHT  08/17/2017    FAMILY HISTORY: Family History  Problem Relation Age of Onset  . Cancer Maternal Uncle   . Cancer Paternal Aunt   . Kidney cancer Neg Hx   . Bladder Cancer Neg Hx     ADVANCED DIRECTIVES (Y/N):  @ADVDIR @  HEALTH MAINTENANCE: Social History  Substance Use Topics  . Smoking status: Former Smoker    Types: Cigarettes    Quit date: 66  . Smokeless tobacco: Never Used  . Alcohol use No     Colonoscopy:  PAP:  Bone density:  Lipid panel:  No Known Allergies  Current Facility-Administered Medications  Medication Dose Route Frequency Provider Last Rate Last Dose  . 0.9 %  sodium chloride infusion   Intravenous Continuous Bettey Costa, MD 100 mL/hr at 09/11/17 1343    . acetaminophen (TYLENOL) tablet 650 mg  650 mg Oral Q6H PRN Bettey Costa, MD       Or  . acetaminophen (TYLENOL) suppository 650 mg  650 mg Rectal Q6H PRN Mody, Sital, MD      . ALPRAZolam Duanne Moron) tablet 0.25 mg  0.25 mg Oral BID PRN Epifanio Lesches, MD      .  bisacodyl (DULCOLAX) EC tablet 5 mg  5 mg Oral Daily PRN Mody, Sital, MD      . enoxaparin (LOVENOX) injection 30 mg  30 mg Subcutaneous Q24H Mody, Sital, MD      . HYDROcodone-acetaminophen (NORCO/VICODIN) 5-325 MG per tablet 1-2 tablet  1-2 tablet Oral Q4H PRN Bettey Costa, MD   2 tablet at 09/11/17 2017  . HYDROmorphone (DILAUDID) injection 0.5 mg  0.5 mg Intravenous Q3H PRN Bettey Costa, MD   0.5 mg at 09/11/17 2145  . lactulose  (CHRONULAC) 10 GM/15ML solution 30 g  30 g Oral BID PRN Murlean Iba, MD   30 g at 09/11/17 1145  . ondansetron (ZOFRAN) tablet 4 mg  4 mg Oral Q6H PRN Bettey Costa, MD       Or  . ondansetron (ZOFRAN) injection 4 mg  4 mg Intravenous Q6H PRN Bettey Costa, MD   4 mg at 09/11/17 2017  . piperacillin-tazobactam (ZOSYN) IVPB 3.375 g  3.375 g Intravenous Q8H Epifanio Lesches, MD 12.5 mL/hr at 09/11/17 2145 3.375 g at 09/11/17 2145  . polyethylene glycol (MIRALAX / GLYCOLAX) packet 17 g  17 g Oral Daily Murlean Iba, MD   17 g at 09/11/17 1145  . promethazine (PHENERGAN) injection 25 mg  25 mg Intravenous Q6H PRN Harrie Foreman, MD   25 mg at 09/11/17 1645  . protein supplement (UNJURY CHICKEN SOUP) powder 2 oz  2 oz Oral BID Epifanio Lesches, MD      . senna-docusate (Senokot-S) tablet 1 tablet  1 tablet Oral QHS PRN Bettey Costa, MD        OBJECTIVE: Vitals:   09/11/17 1243 09/11/17 2016  BP: 124/64 123/81  Pulse: 67 64  Resp: 18 20  Temp: (!) 97.5 F (36.4 C) 97.9 F (36.6 C)  SpO2: 96% 100%     Body mass index is 38.02 kg/m.    ECOG FS:2 - Symptomatic, <50% confined to bed  General: Ill-appearing, no acute distress. Eyes: Pink conjunctiva, anicteric sclera. HEENT: Normocephalic, moist mucous membranes, clear oropharnyx. Lungs: Clear to auscultation bilaterally. Heart: Regular rate and rhythm. No rubs, murmurs, or gallops. Abdomen: Soft, nontender, nondistended. No organomegaly noted, normoactive bowel sounds. Musculoskeletal: No edema, cyanosis, or clubbing. Neuro: Alert, answering all questions appropriately. Cranial nerves grossly intact. Skin: No rashes or petechiae noted. Psych: Normal affect.   LAB RESULTS:  Lab Results  Component Value Date   NA 136 09/11/2017   K 4.5 09/11/2017   CL 105 09/11/2017   CO2 22 09/11/2017   GLUCOSE 113 (H) 09/11/2017   BUN 40 (H) 09/11/2017   CREATININE 2.63 (H) 09/11/2017   CALCIUM 9.2 09/11/2017   PROT 9.2 (H)  09/10/2017   ALBUMIN 3.8 09/10/2017   AST 17 09/10/2017   ALT 12 (L) 09/10/2017   ALKPHOS 92 09/10/2017   BILITOT 0.5 09/10/2017   GFRNONAA 20 (L) 09/11/2017   GFRAA 23 (L) 09/11/2017    Lab Results  Component Value Date   WBC 6.1 09/11/2017   NEUTROABS 6.3 09/10/2017   HGB 9.5 (L) 09/11/2017   HCT 28.1 (L) 09/11/2017   MCV 79.2 (L) 09/11/2017   PLT 314 09/11/2017     STUDIES: Ct Abdomen Pelvis Wo Contrast  Result Date: 09/10/2017 CLINICAL DATA:  Right lower quadrant pain.  Cervical carcinoma. EXAM: CT ABDOMEN AND PELVIS WITHOUT CONTRAST TECHNIQUE: Multidetector CT imaging of the abdomen and pelvis was performed following the standard protocol without IV contrast. COMPARISON:  08/12/2017 FINDINGS: Lower chest: No acute  findings. Prior cholecystectomy. No evidence of biliary dilatation. Hepatobiliary:  No mass visualized on this unenhanced exam. Pancreas: No mass or inflammatory process visualized on this unenhanced exam. Spleen:  Within normal limits in size. Adrenals/Urinary tract: Right-sided percutaneous nephrostomy tube remains in appropriate position. No evidence of hydronephrosis. Stomach/Bowel: No evidence of obstruction, inflammatory process, or abnormal fluid collections. Diverticulosis mainly involving the descending sigmoid colon, without evidence of diverticulitis. Vascular/Lymphatic: No pathologically enlarged lymph nodes identified. No evidence of abdominal aortic aneurysm. Aortic atherosclerosis. Reproductive: Stable ill-defined cervical mass extending into right parametrial soft tissues. No other pelvic or adnexal mass identified. No evidence of free fluid. Other:  Stable small paraumbilical hernia containing only fat. Musculoskeletal:  No suspicious bone lesions identified. IMPRESSION: No acute findings. Stable ill-defined cervical mass extending into right parametrial soft tissues. No new or progressive disease identified. Colonic diverticulosis, without radiographic  evidence of diverticulitis. Electronically Signed   By: Earle Gell M.D.   On: 09/10/2017 21:29   Ir Nephrostomy Exchange Right  Result Date: 08/17/2017 INDICATION: History of cervical cancer post right-sided percutaneous nephrostomy catheter placement on 02/23/2017. Patient was initially scheduled for attempted conversion of the nephrostomy catheter to a double-J ureteral stent, however was recently admitted with a urinary tract infection and as such, request change to the routine nephrostomy catheter exchange. Given patient discomfort during previous catheter exchanges, the patient requests the procedure to be performed with conscious sedation. EXAM: FLUOROSCOPIC GUIDED RIGHT SIDED NEPHROSTOMY CATHETER EXCHANGE COMPARISON:  CT abdomen pelvis - 08/12/2017; 06/27/2017; right-sided percutaneous nephrostomy catheter placement - 02/23/2017; right-sided percutaneous nephrostomy catheter exchange - 06/08/2017 CONTRAST:  10 mL Isovue-300 administered into the collecting system FLUOROSCOPY TIME:  18 seconds (14 mGy) ANESTHESIA/SEDATION: Moderate (conscious) sedation was employed during this procedure. A total of Versed 1 mg and Fentanyl 50 mcg was administered intravenously. Moderate Sedation Time: 18 minutes. The patient's level of consciousness and vital signs were monitored continuously by radiology nursing throughout the procedure under my direct supervision. COMPLICATIONS: None immediate. TECHNIQUE: Informed written consent was obtained from the patient after a discussion of the risks, benefits and alternatives to treatment. Questions regarding the procedure were encouraged and answered. A timeout was performed prior to the initiation of the procedure. The right flank and external portion of existing nephrostomy catheter were prepped and draped in the usual sterile fashion. A sterile drape was applied covering the operative field. Maximum barrier sterile technique with sterile gowns and gloves were used for the  procedure. A timeout was performed prior to the initiation of the procedure. A pre procedural spot fluoroscopic image was obtained after contrast was injected via the existing nephrostomy catheter demonstrating appropriate positioning within the renal pelvis. The existing nephrostomy catheter was cut and cannulated with a Benson wire which was coiled within the renal pelvis. Under intermittent fluoroscopic guidance, the existing nephrostomy catheter was exchanged for a new 10.2 [place] all-purpose drainage catheter. Contrast injection confirmed appropriate positioning within the renal pelvis and a post exchange fluoroscopic image was obtained. The catheter was locked and secured to the skin with a suture. A dressing was placed. The patient tolerated the procedure well without immediate postprocedural complication. FINDINGS: The existing nephrostomy catheter is appropriately positioned and functioning. After successful fluoroscopic guided exchange, the new nephrostomy catheter is coiled and locked within the right renal pelvis. Note is again made of a nonocclusive filling defect within the caudal aspect of the right renal pelvis, similar to prior nephrostomy catheter exchange performed 06/08/2017 though without a CT correlate on either  the 9/2 or the 7/18 examinations. IMPRESSION: 1. Successful fluoroscopic guided exchange of right sided 10.2 French percutaneous nephrostomy catheter. 2. Note is again made of a nonocclusive filling defect in caudal aspect the right renal pelvis, similar to prior nephrostomy catheter exchange performed 06/08/2017, though without a CT correlate on either the 7/18 or the 9/2 examinations. Differential considerations include an air bubble, chronic thrombus or (less likely) a discrete collecting system lesion. Continued attention on follow-up is recommended. Electronically Signed   By: Sandi Mariscal M.D.   On: 08/17/2017 08:44    ASSESSMENT: Intractable nausea and vomiting, dehydration,  acute renal failure, history of squamous cell cervix.  PLAN:    1. Squamous cell carcinoma of the cervix: Patient completed concurrent chemotherapy and XRT in April 2018. Brachytherapy at [facility] has also been completed. Recent PET scan at [facility] on July 25, 2017 was possibly suggestive of residual malignancy. No intervention or treatment planned at this time.  2. Intractable nausea and vomiting: Unclear etiology. Consider GI consult in the next 1-2 days it does not resolve. 3. Acute renal failure: Likely secondary to nausea and vomiting. Patient has a right nephrostomy tube in place her most recent exchange occurring on August 17, 2017. CT scan results reviewed independently and reported as above with no obvious etiology. Appreciate urology and nephrology input. 4. UTI: Continue current antibiotics. Appreciate infectious disease input. 5. Pain: Continue current narcotic regimen. 6. Constipation: Continue current treatment. GI referral as above with no resolution.  Appreciate consult, will follow.    Lloyd Huger, MD   09/11/2017 11:01 PM

## 2017-09-11 NOTE — Progress Notes (Signed)
Initial Nutrition Assessment  DOCUMENTATION CODES:   Severe malnutrition in context of chronic illness  INTERVENTION:  1. Ordered snacks for patient.  2. Unjury Chicken Soup BID, each supplement provides 100 calories and 21 grams of protein  NUTRITION DIAGNOSIS:   Malnutrition (Severe) related to chronic illness as evidenced by energy intake < 75% for > or equal to 1 month, percent weight loss.  GOAL:   Patient will meet greater than or equal to 90% of their needs  MONITOR:   PO intake, I & O's, Labs, Weight trends, Skin  REASON FOR ASSESSMENT:   Malnutrition Screening Tool    ASSESSMENT:   Patricia Rojas is a 51 yo female with PMH of squamous cell carcinoma of cervix, r hydronephrosis s/p nephrostomy tube, presents with nausea/vomiting, AKI, acute on chronic abd pain, UTI.   Spoke with Patricia Rojas at bedside. She reports poor appetite with intermittent nausea/vomiting x6 months. She states she has been eating a cup of jello, or a peanut butter sandwich and grapes or watermelon for breakfast. Doesn't normally eat lunch or dinner. She complains that as the day goes on her nausea becomes worse. States she can only really tolerate fruit bagels, or a handful of cheez-its at home, very light food in the evenings. Nausea medication used to help her but she says it is not helping now. During these past 6 months she reports losing 75 - 80 pounds with a UBW of 320 pounds indicating a 23.5-25% severe weight loss over 6 months. Per chart, appears the patient was 282 pounds in March indicating a 19.1% severe weight loss over 7 months. Cannot tolerate boost/ensure per patient. Used to consume yogurt but cannot tolerate that anymore as well.  Ate 1/2 a bagel, 1/2 a cup of coffee, and 1 cup of grapes for breakfast. Had grapes and rice krispy cereal brought to her for lunch during my visit.  Discussed possible snacks to help her increase PO intake. Will order fruit, PB crackers, PB bagels and PB  sandwiches for her.  Per oncology note: Completed chemotherapy and XRT in April 2018. Completed brachytherapy at [facility]. No evidence of residual malignancy at this time.  Labs reviewed:  BUN/Creatinine 40/2.63 (trending downwards)  Medications reviewed and include:  NS at 179mL/hr Zofran Meal Completion: 95%   Diet Order:  Diet Heart Room service appropriate? Yes; Fluid consistency: Thin  Skin:  Reviewed, no issues  Last BM:  PTA  Height:   Ht Readings from Last 1 Encounters:  09/10/17 5\' 5"  (1.651 m)    Weight:   Wt Readings from Last 1 Encounters:  09/10/17 228 lb 8 oz (103.6 kg)    Ideal Body Weight:  56.81 kg  BMI:  Body mass index is 38.02 kg/m.  Estimated Nutritional Needs:   Kcal:  7616-0737 calories (25-30 cal/kg ABW)  Protein:  135-150 grams (1.3-1.5g/kg)  Fluid:  1.7-2L  EDUCATION NEEDS:   Education needs addressed  Patricia Rojas. Patricia Ackerley, MS, RD LDN Inpatient Clinical Dietitian Pager [phone]

## 2017-09-11 NOTE — Progress Notes (Signed)
[facility] [place] NAME: Patricia Rojas    MR#:  [id number]  DATE OF BIRTH:  [birth year]  SUBJECTIVE: she is admitted for nausea, vomiting, right flank pain and found to have acute kidney injury. On IV fluids. No further nausea or vomiting but still has right flank pain and also complains of pain radiating to the front of her abdomen.   CHIEF COMPLAINT:  No chief complaint on file.   REVIEW OF SYSTEMS:   ROS CONSTITUTIONAL: No fever, fatigue or weakness.  EYES: No blurred or double vision.  EARS, NOSE, AND THROAT: No tinnitus or ear pain.  RESPIRATORY: No cough, shortness of breath, wheezing or hemoptysis.  CARDIOVASCULAR: No chest pain, orthopnea, edema.  GASTROINTESTINAL: No nausea, vomiting, diarrhea.Patient has right flank pain, right buttock pain, right flank pain going to the front of her abdomen and patient is tearful because of pain.  GENITOURINARY: No dysuria, hematuria.  ENDOCRINE: No polyuria, nocturia,  HEMATOLOGY: No anemia, easy bruising or bleeding SKIN: No rash or lesion. MUSCULOSKELETAL: No joint pain or arthritis.   NEUROLOGIC: No tingling, numbness, weakness.  PSYCHIATRY: No anxiety or depression.   DRUG ALLERGIES:  No Known Allergies  VITALS:  Blood pressure 124/64, pulse 67, temperature (!) 97.5 F (36.4 C), temperature source Oral, resp. rate 18, height 5\' 5"  (1.651 m), weight 103.6 kg (228 lb 8 oz), last menstrual period 02/28/2017, SpO2 96 %.  PHYSICAL EXAMINATION:  GENERAL:  51 y.o.-year-old patient lying in the bed with no acute distress.  EYES: Pupils equal, round, reactive to light and accommodation. No scleral icterus. Extraocular muscles intact.  HEENT: Head atraumatic, normocephalic. Oropharynx and nasopharynx clear.  NECK:  Supple, no jugular venous distention. No thyroid enlargement, no tenderness.  LUNGS: Normal breath sounds bilaterally, no wheezing, rales,rhonchi or crepitation. No use of  accessory muscles of respiration.  CARDIOVASCULAR: S1, S2 normal. No murmurs, rubs, or gallops.  ABDOMEN: Slight tenderness in the right flank area.  EXTREMITIES: No pedal edema, cyanosis, or clubbing.  NEUROLOGIC: Cranial nerves II through XII are intact. Muscle strength 5/5 in all extremities. Sensation intact. Gait not checked.  PSYCHIATRIC: The patient is alert and oriented x 3.  SKIN: No obvious rash, lesion, or ulcer.    LABORATORY PANEL:   CBC  Recent Labs Lab 09/11/17 0459  WBC 6.1  HGB 9.5*  HCT 28.1*  PLT 314   ------------------------------------------------------------------------------------------------------------------  Chemistries   Recent Labs Lab 09/10/17 1405 09/11/17 0459  NA 134* 136  K 3.6 4.5  CL 100* 105  CO2 23 22  GLUCOSE 95 113*  BUN 42* 40*  CREATININE 3.31* 2.63*  CALCIUM 9.7 9.2  AST 17  --   ALT 12*  --   ALKPHOS 92  --   BILITOT 0.5  --    ------------------------------------------------------------------------------------------------------------------  Cardiac Enzymes No results for input(s): TROPONINI in the last 168 hours. ------------------------------------------------------------------------------------------------------------------  RADIOLOGY:  Ct Abdomen Pelvis Wo Contrast  Result Date: 09/10/2017 CLINICAL DATA:  Right lower quadrant pain.  Cervical carcinoma. EXAM: CT ABDOMEN AND PELVIS WITHOUT CONTRAST TECHNIQUE: Multidetector CT imaging of the abdomen and pelvis was performed following the standard protocol without IV contrast. COMPARISON:  08/12/2017 FINDINGS: Lower chest: No acute findings. Prior cholecystectomy. No evidence of biliary dilatation. Hepatobiliary:  No mass visualized on this unenhanced exam. Pancreas: No mass or inflammatory process visualized on this unenhanced exam. Spleen:  Within normal limits in size. Adrenals/Urinary tract: Right-sided percutaneous nephrostomy tube remains in appropriate position.  No  evidence of hydronephrosis. Stomach/Bowel: No evidence of obstruction, inflammatory process, or abnormal fluid collections. Diverticulosis mainly involving the descending sigmoid colon, without evidence of diverticulitis. Vascular/Lymphatic: No pathologically enlarged lymph nodes identified. No evidence of abdominal aortic aneurysm. Aortic atherosclerosis. Reproductive: Stable ill-defined cervical mass extending into right parametrial soft tissues. No other pelvic or adnexal mass identified. No evidence of free fluid. Other:  Stable small paraumbilical hernia containing only fat. Musculoskeletal:  No suspicious bone lesions identified. IMPRESSION: No acute findings. Stable ill-defined cervical mass extending into right parametrial soft tissues. No new or progressive disease identified. Colonic diverticulosis, without radiographic evidence of diverticulitis. Electronically Signed   By: Earle Gell M.D.   On: 09/10/2017 21:29    EKG:   Orders placed or performed during the hospital encounter of 06/27/17  . ED EKG  . ED EKG  . ED EKG 12-Lead  . ED EKG 12-Lead    ASSESSMENT AND PLAN:   #1 acute kidney injury ATN secondary to dehydration: Improving with IV fluids. Nephrology is following. #2. Persistent abdominal pain, right flank pain: Check lipase level to evaluate for pancreatitis. #3 history of cervical cancer, infiltrated the right distal ureters/p right-sided hydronephrosis, status post right nephrostomy: Followed by urology.;  #3. history of UTI with the diphtheroids: Urine culture sent September showed diphtheroids. Continue Levaquin.   All the records are reviewed and case discussed with Care Management/Social Workerr. Management plans discussed with the patient, family and they are in agreement.  CODE STATUS: Full code  TOTAL TIME TAKING CARE OF THIS PATIENT: 35 minutes.   POSSIBLE D/C IN 1-2DAYS, DEPENDING ON CLINICAL CONDITION.   Epifanio Lesches M.D on 09/11/2017 at 12:53  PM  Between 7am to 6pm - Pager - [phone]  After 6pm go to [email or web address] - password [email or web address] [facility]  Office  [phone]  CC: Primary care physician; [name], MD   Note: This dictation was prepared with Dragon dictation along with smaller phrase technology. Any transcriptional errors that result from this process are unintentional.

## 2017-09-11 NOTE — Consult Note (Signed)
[facility]ious [facility]     Reason for Consult: UTI    Referring Physician: Governor Specking Date of Admission:  09/10/2017   Active Problems:   Acute kidney injury ([place])   HPI: Patricia Rojas is a 51 y.o. female with cervical cancer and obstructive hydronephrosis with nephrostomy tubes in place and recurrent UTIs, now with dehydration and ARF from recurrent NV and poor po intake.  Her UA is + and ucx pending. However does not have leukocytosis nor fever. Urology has seen and had CT scan and tube seems in appropriate position.    Past Medical History:  Diagnosis Date  . Cancer (HCC)    cervical  . Constipation   . Hemorrhoids   . N&V (nausea and vomiting)   . PONV (postoperative nausea and vomiting)    Past Surgical History:  Procedure Laterality Date  . CESAREAN SECTION  1986  . CHOLECYSTECTOMY    . HERNIA REPAIR    . IR GENERIC HISTORICAL  02/23/2017   IR NEPHROSTOMY PLACEMENT RIGHT 02/23/2017 [name], MD [facility]-INTERV RAD  . IR NEPHROSTOMY EXCHANGE RIGHT  04/18/2017  . IR NEPHROSTOMY EXCHANGE RIGHT  06/08/2017  . IR NEPHROSTOMY EXCHANGE RIGHT  08/17/2017   Social History  Substance Use Topics  . Smoking status: Former Smoker    Types: Cigarettes    Quit date: 81  . Smokeless tobacco: Never Used  . Alcohol use No   Family History  Problem Relation Age of Onset  . Cancer Maternal Uncle   . Cancer Paternal Aunt   . Kidney cancer Neg Hx   . Bladder Cancer Neg Hx     Allergies: No Known Allergies  Current antibiotics: Antibiotics Given (last 72 hours)    Date/Time Action Medication Dose Rate   09/10/17 2008 New Bag/Given   levofloxacin (LEVAQUIN) IVPB 750 mg 750 mg 100 mL/hr      MEDICATIONS: . enoxaparin (LOVENOX) injection  30 mg Subcutaneous Q24H  . polyethylene glycol  17 g Oral Daily  . protein supplement  2 oz Oral BID    Review of Systems - 11 systems reviewed and negative per HPI   OBJECTIVE: Temp:  [97.5 F (36.4 C)-98.2 F (36.8 C)] 97.5  F (36.4 C) (10/02 1243) Pulse Rate:  [67-84] 67 (10/02 1243) Resp:  [16-20] 18 (10/02 1243) BP: (120-149)/(64-90) 124/64 (10/02 1243) SpO2:  [96 %-98 %] 96 % (10/02 1243) Weight:  [103.6 kg (228 lb 8 oz)] 103.6 kg (228 lb 8 oz) (10/01 1640) Physical Exam  Constitutional:  oriented to person, place, and time. appears well-developed and well-nourished. No distress.  HENT: [place]/AT, PERRLA, no scleral icterus Mouth/Throat: Oropharynx is clear and moist. No oropharyngeal exudate.  Cardiovascular: Normal rate, regular rhythm and normal heart sounds. Exam reveals no gallop and no friction rub.  No murmur heard.  Pulmonary/Chest: Effort normal and breath sounds normal. No respiratory distress.  has no wheezes.  Neck = supple, no nuchal rigidity Abdominal: Soft. Bowel sounds are normal.  exhibits no distension. There is no tenderness.  R nephrostomy tube with clear urine  Lymphadenopathy: no cervical adenopathy. No axillary adenopathy Neurological: alert and oriented to person, place, and time.  Skin: Skin is warm and dry. No rash noted. No erythema.  Psychiatric: a normal mood and affect.  behavior is normal.    LABS: Results for orders placed or performed during the hospital encounter of 09/10/17 (from the past 48 hour(s))  Basic metabolic panel     Status: Abnormal  Collection Time: 09/11/17  4:59 AM  Result Value Ref Range   Sodium 136 135 - 145 mmol/L   Potassium 4.5 3.5 - 5.1 mmol/L   Chloride 105 101 - 111 mmol/L   CO2 22 22 - 32 mmol/L   Glucose, Bld 113 (H) 65 - 99 mg/dL   BUN 40 (H) 6 - 20 mg/dL   Creatinine, Ser 2.63 (H) 0.44 - 1.00 mg/dL   Calcium 9.2 8.9 - 10.3 mg/dL   GFR calc non Af Amer 20 (L) >60 mL/min   GFR calc Af Amer 23 (L) >60 mL/min    Comment: (NOTE) The eGFR has been calculated using the CKD EPI equation. This calculation has not been validated in all clinical situations. eGFR's persistently <60 mL/min signify possible Chronic Kidney Disease.    Anion gap  9 5 - 15  CBC     Status: Abnormal   Collection Time: 09/11/17  4:59 AM  Result Value Ref Range   WBC 6.1 3.6 - 11.0 K/uL   RBC 3.55 (L) 3.80 - 5.20 MIL/uL   Hemoglobin 9.5 (L) 12.0 - 16.0 g/dL   HCT 28.1 (L) 35.0 - 47.0 %   MCV 79.2 (L) 80.0 - 100.0 fL   MCH 26.9 26.0 - 34.0 pg   MCHC 34.0 32.0 - 36.0 g/dL   RDW 15.8 (H) 11.5 - 14.5 %   Platelets 314 150 - 440 K/uL  Lipase, blood     Status: None   Collection Time: 09/11/17  4:59 AM  Result Value Ref Range   Lipase 23 11 - 51 U/L  Urinalysis, Routine w reflex microscopic     Status: Abnormal   Collection Time: 09/11/17 11:52 AM  Result Value Ref Range   Color, Urine YELLOW (A) YELLOW   APPearance CLOUDY (A) CLEAR   Specific Gravity, Urine 1.014 1.005 - 1.030   pH 5.0 5.0 - 8.0   Glucose, UA NEGATIVE NEGATIVE mg/dL   Hgb urine dipstick SMALL (A) NEGATIVE   Bilirubin Urine NEGATIVE NEGATIVE   Ketones, ur 5 (A) NEGATIVE mg/dL   Protein, ur NEGATIVE NEGATIVE mg/dL   Nitrite NEGATIVE NEGATIVE   Leukocytes, UA SMALL (A) NEGATIVE   RBC / HPF 0-5 0 - 5 RBC/hpf   WBC, UA 6-30 0 - 5 WBC/hpf   Bacteria, UA RARE (A) NONE SEEN   Squamous Epithelial / LPF 0-5 (A) NONE SEEN   Mucus PRESENT    No components found for: ESR, C REACTIVE PROTEIN MICRO: Recent Results (from the past 720 hour(s))  Urine Culture     Status: Abnormal   Collection Time: 08/23/17  7:45 PM  Result Value Ref Range Status   Specimen Description KIDNEY  Final   Special Requests nephrostomy Normal  Final   Culture (A)  Final    >=100,000 COLONIES/mL DIPHTHEROIDS(CORYNEBACTERIUM SPECIES) Standardized susceptibility testing for this organism is not available. Performed at [facility], [place] [address] [place], [place] [address]    Report Status 08/26/2017 FINAL  Final    IMAGING: Ct Abdomen Pelvis Wo Contrast  Result Date: 09/10/2017 CLINICAL DATA:  Right lower quadrant pain.  Cervical carcinoma. EXAM: CT ABDOMEN AND PELVIS WITHOUT CONTRAST TECHNIQUE:  Multidetector CT imaging of the abdomen and pelvis was performed following the standard protocol without IV contrast. COMPARISON:  08/12/2017 FINDINGS: Lower chest: No acute findings. Prior cholecystectomy. No evidence of biliary dilatation. Hepatobiliary:  No mass visualized on this unenhanced exam. Pancreas: No mass or inflammatory process visualized on this unenhanced  exam. Spleen:  Within normal limits in size. Adrenals/Urinary tract: Right-sided percutaneous nephrostomy tube remains in appropriate position. No evidence of hydronephrosis. Stomach/Bowel: No evidence of obstruction, inflammatory process, or abnormal fluid collections. Diverticulosis mainly involving the descending sigmoid colon, without evidence of diverticulitis. Vascular/Lymphatic: No pathologically enlarged lymph nodes identified. No evidence of abdominal aortic aneurysm. Aortic atherosclerosis. Reproductive: Stable ill-defined cervical mass extending into right parametrial soft tissues. No other pelvic or adnexal mass identified. No evidence of free fluid. Other:  Stable small paraumbilical hernia containing only fat. Musculoskeletal:  No suspicious bone lesions identified. IMPRESSION: No acute findings. Stable ill-defined cervical mass extending into right parametrial soft tissues. No new or progressive disease identified. Colonic diverticulosis, without radiographic evidence of diverticulitis. Electronically Signed   By: Earle Gell M.D.   On: 09/10/2017 21:29   Ir Nephrostomy Exchange Right  Result Date: 08/17/2017 INDICATION: History of cervical cancer post right-sided percutaneous nephrostomy catheter placement on 02/23/2017. Patient was initially scheduled for attempted conversion of the nephrostomy catheter to a double-J ureteral stent, however was recently admitted with a urinary tract infection and as such, request change to the routine nephrostomy catheter exchange. Given patient discomfort during previous catheter exchanges, the  patient requests the procedure to be performed with conscious sedation. EXAM: FLUOROSCOPIC GUIDED RIGHT SIDED NEPHROSTOMY CATHETER EXCHANGE COMPARISON:  CT abdomen pelvis - 08/12/2017; 06/27/2017; right-sided percutaneous nephrostomy catheter placement - 02/23/2017; right-sided percutaneous nephrostomy catheter exchange - 06/08/2017 CONTRAST:  10 mL [id number]-300 administered into the collecting system FLUOROSCOPY TIME:  18 seconds (14 mGy) ANESTHESIA/SEDATION: Moderate (conscious) sedation was employed during this procedure. A total of Versed 1 mg and Fentanyl 50 mcg was administered intravenously. Moderate Sedation Time: 18 minutes. The patient's level of consciousness and vital signs were monitored continuously by radiology nursing throughout the procedure under my direct supervision. COMPLICATIONS: None immediate. TECHNIQUE: Informed written consent was obtained from the patient after a discussion of the risks, benefits and alternatives to treatment. Questions regarding the procedure were encouraged and answered. A timeout was performed prior to the initiation of the procedure. The right flank and external portion of existing nephrostomy catheter were prepped and draped in the usual sterile fashion. A sterile drape was applied covering the operative field. Maximum barrier sterile technique with sterile gowns and gloves were used for the procedure. A timeout was performed prior to the initiation of the procedure. A pre procedural spot fluoroscopic image was obtained after contrast was injected via the existing nephrostomy catheter demonstrating appropriate positioning within the renal pelvis. The existing nephrostomy catheter was cut and cannulated with a Benson wire which was coiled within the renal pelvis. Under intermittent fluoroscopic guidance, the existing nephrostomy catheter was exchanged for a new 10.2 [place] all-purpose drainage catheter. Contrast injection confirmed appropriate positioning within the  renal pelvis and a post exchange fluoroscopic image was obtained. The catheter was locked and secured to the skin with a suture. A dressing was placed. The patient tolerated the procedure well without immediate postprocedural complication. FINDINGS: The existing nephrostomy catheter is appropriately positioned and functioning. After successful fluoroscopic guided exchange, the new nephrostomy catheter is coiled and locked within the right renal pelvis. Note is again made of a nonocclusive filling defect within the caudal aspect of the right renal pelvis, similar to prior nephrostomy catheter exchange performed 06/08/2017 though without a CT correlate on either the 9/2 or the 7/18 examinations. IMPRESSION: 1. Successful fluoroscopic guided exchange of right sided 10.2 French percutaneous nephrostomy catheter. 2. Note is again made of a  nonocclusive filling defect in caudal aspect the right renal pelvis, similar to prior nephrostomy catheter exchange performed 06/08/2017, though without a CT correlate on either the 7/18 or the 9/2 examinations. Differential considerations include an air bubble, chronic thrombus or (less likely) a discrete collecting system lesion. Continued attention on follow-up is recommended. Electronically Signed   By: Sandi Mariscal M.D.   On: 08/17/2017 08:44    Assessment:   Patricia Rojas is a 51 y.o. female with cervical cancer and obstructive hydronephrosis with nephrostomy tubes in place and recurrent UTIs, now with dehydration and ARF from recurrent NV and poor po intake.  Her UA is + and ucx pending. However does not have leukocytosis nor fever. Urology has seen and had CT scan and tube seems in appropriate position.  Unclear if the NV is from possible UTI but would wu other causes.   Recommendations Start zosyn FU UCX May need picc and IV treatment if resistant organism identified.  Thank you very much for allowing me to participate in the care of this patient. Please call with  questions.   Cheral Marker. Ola Spurr, MD

## 2017-09-12 LAB — BASIC METABOLIC PANEL
Anion gap: 7 (ref 5–15)
BUN: 37 mg/dL — AB (ref 6–20)
CHLORIDE: 107 mmol/L (ref 101–111)
CO2: 23 mmol/L (ref 22–32)
CREATININE: 2.01 mg/dL — AB (ref 0.44–1.00)
Calcium: 9 mg/dL (ref 8.9–10.3)
GFR calc non Af Amer: 28 mL/min — ABNORMAL LOW (ref >60)
GFR, EST AFRICAN AMERICAN: 32 mL/min — AB (ref >60)
Glucose, Bld: 101 mg/dL — ABNORMAL HIGH (ref 65–99)
Potassium: 4 mmol/L (ref 3.5–5.1)
Sodium: 137 mmol/L (ref 135–145)

## 2017-09-12 LAB — URINE CULTURE
Culture: <10000 — AB
Culture: >=100000 — AB

## 2017-09-12 MED ORDER — FENTANYL 25 MCG/HR TD PT72
25.0000 ug | MEDICATED_PATCH | TRANSDERMAL | Status: DC
  Administered 2017-09-12: 25 ug via TRANSDERMAL
  Filled 2017-09-12: qty 1

## 2017-09-12 MED ORDER — FENTANYL 25 MCG/HR TD PT72: 25.0000 ug | MEDICATED_PATCH | TRANSDERMAL | Status: DC

## 2017-09-12 MED ORDER — FLUCONAZOLE 100MG IVPB
100.0000 mg | INTRAVENOUS | Status: DC
  Administered 2017-09-12: 100 mg via INTRAVENOUS
  Filled 2017-09-12 (×2): qty 50

## 2017-09-12 NOTE — Progress Notes (Signed)
[facility] [place], [place] 09/12/17  Subjective:   S Creatinine today has improved further to 2.01 (2.63) Patient feels a lot better today after she had a good bowel movement No leg edema No shortness of breath   Objective:  Vital signs in last 24 hours:  Temp:  [97.5 F (36.4 C)-97.9 F (36.6 C)] 97.6 F (36.4 C) (10/03 0418) Pulse Rate:  [63-67] 63 (10/03 0418) Resp:  [18-20] 20 (10/03 0418) BP: (121-124)/(64-81) 121/77 (10/03 0418) SpO2:  [96 %-100 %] 100 % (10/03 0418)  Weight change:  Filed Weights   09/10/17 1640  Weight: 103.6 kg (228 lb 8 oz)    Intake/Output:    Intake/Output Summary (Last 24 hours) at 09/12/17 0925 Last data filed at 09/12/17 0400  Gross per 24 hour  Intake             1190 ml  Output              250 ml  Net              940 ml    Physical Exam: General:  Some distress from the pain, laying in the bed   HEENT Anicteric, moist oral mucous membranes   Neck:  Supple, no distended neck veins   Lungs: Clear to auscultation, normal breathing effort   Heart::  Regular, soft systolic murmur   Abdomen: Soft, mild right lower quadrant tenderness and suprapubic tenderness   Extremities:  No peripheral edema   Neurologic: Alert, oriented   Skin: No acute rashes      Basic Metabolic Panel:   Recent Labs Lab 09/10/17 1405 09/11/17 0459 09/12/17 0600  NA 134* 136 137  K 3.6 4.5 4.0  CL 100* 105 107  CO2 23 22 23   GLUCOSE 95 113* 101*  BUN 42* 40* 37*  CREATININE 3.31* 2.63* 2.01*  CALCIUM 9.7 9.2 9.0     CBC:  Recent Labs Lab 09/10/17 1405 09/11/17 0459  WBC 7.9 6.1  NEUTROABS 6.3  --   HGB 10.5* 9.5*  HCT 30.6* 28.1*  MCV 77.7* 79.2*  PLT 368 314     No results found for: HEPBSAG, HEPBSAB, HEPBIGM    Microbiology:  Recent Results (from the past 240 hour(s))  Urine Culture     Status: Abnormal   Collection Time: 09/10/17  1:54 PM  Result Value Ref Range Status   Specimen Description  URINE, CLEAN CATCH  Final   Special Requests NONE  Final   Culture (A)  Final    <10,000 COLONIES/mL Performed at [facility], [address] [place], [place] [address]    Report Status 09/12/2017 FINAL  Final    Coagulation Studies: No results for input(s): LABPROT, INR in the last 72 hours.  Urinalysis:  Recent Labs  09/10/17 1353 09/11/17 1152  COLORURINE AMBER* YELLOW*  LABSPEC 1.023 1.014  PHURINE 5.0 5.0  GLUCOSEU NEGATIVE NEGATIVE  HGBUR SMALL* SMALL*  BILIRUBINUR NEGATIVE NEGATIVE  KETONESUR 5* 5*  PROTEINUR 100* NEGATIVE  NITRITE NEGATIVE NEGATIVE  LEUKOCYTESUR LARGE* SMALL*      Imaging: Ct Abdomen Pelvis Wo Contrast  Result Date: 09/10/2017 CLINICAL DATA:  Right lower quadrant pain.  Cervical carcinoma. EXAM: CT ABDOMEN AND PELVIS WITHOUT CONTRAST TECHNIQUE: Multidetector CT imaging of the abdomen and pelvis was performed following the standard protocol without IV contrast. COMPARISON:  08/12/2017 FINDINGS: Lower chest: No acute findings. Prior cholecystectomy. No evidence of biliary dilatation. Hepatobiliary:  No mass visualized on this  unenhanced exam. Pancreas: No mass or inflammatory process visualized on this unenhanced exam. Spleen:  Within normal limits in size. Adrenals/Urinary tract: Right-sided percutaneous nephrostomy tube remains in appropriate position. No evidence of hydronephrosis. Stomach/Bowel: No evidence of obstruction, inflammatory process, or abnormal fluid collections. Diverticulosis mainly involving the descending sigmoid colon, without evidence of diverticulitis. Vascular/Lymphatic: No pathologically enlarged lymph nodes identified. No evidence of abdominal aortic aneurysm. Aortic atherosclerosis. Reproductive: Stable ill-defined cervical mass extending into right parametrial soft tissues. No other pelvic or adnexal mass identified. No evidence of free fluid. Other:  Stable small paraumbilical hernia containing only fat. Musculoskeletal:   No suspicious bone lesions identified. IMPRESSION: No acute findings. Stable ill-defined cervical mass extending into right parametrial soft tissues. No new or progressive disease identified. Colonic diverticulosis, without radiographic evidence of diverticulitis. Electronically Signed   By: Earle Gell M.D.   On: 09/10/2017 21:29     Medications:   . sodium chloride 100 mL/hr at 09/12/17 0054  . piperacillin-tazobactam (ZOSYN)  IV 3.375 g (09/12/17 2992)   . enoxaparin (LOVENOX) injection  30 mg Subcutaneous Q24H  . polyethylene glycol  17 g Oral Daily  . protein supplement  2 oz Oral BID   acetaminophen **OR** acetaminophen, ALPRAZolam, bisacodyl, HYDROcodone-acetaminophen, HYDROmorphone (DILAUDID) injection, lactulose, ondansetron **OR** ondansetron (ZOFRAN) IV, promethazine, senna-docusate  Assessment/ Plan:  51 y.o. female with squamous cell  cervical cancer, right hydronephrosis, right percutaneous nephrostomy tube, was admitted on 09/10/2017 with severe nausea, vomiting.   1. Acute renal failure 2.Chronic kidney disease stage III, baseline 1.14/GFR 55 from 08/15/2017 3. Right hydronephrosis secondary to infiltration from cervical cancer s/p Rt perc Nephrostomy  Acute renal failure is likely secondary to volume depletion CT scan of the abdomen is negative for obstruction and nephrostomy is in the appropriate position Today's serum creatinine has further improved    Plan: Continue IV fluids Encourage oral intake  pain management-     LOS: 2 Patricia Rojas 10/3/20189:25 [place], [place]

## 2017-09-12 NOTE — Progress Notes (Signed)
[facility] [place] NAME: Patricia Rojas    MR#:  [id number]  DATE OF BIRTH:  August 03, 1966  SUBJECTIVE; she says she is feeling little better today. less abdominal pain after she had a BM yesterday. Tolerating the diet. Limp to try fentanyl patch for pain related to cancer.   CHIEF COMPLAINT:  No chief complaint on file.   REVIEW OF SYSTEMS:   ROS CONSTITUTIONAL: No fever, fatigue or weakness.  EYES: No blurred or double vision.  EARS, NOSE, AND THROAT: No tinnitus or ear pain.  RESPIRATORY: No cough, shortness of breath, wheezing or hemoptysis.  CARDIOVASCULAR: No chest pain, orthopnea, edema.  GASTROINTESTINAL: No nausea, vomiting, diarrhea.Patient has right flank pain, right buttock pain, right flank pain going to the front of her abdomen and patient is tearful because of pain.  GENITOURINARY: No dysuria, hematuria.  ENDOCRINE: No polyuria, nocturia,  HEMATOLOGY: No anemia, easy bruising or bleeding SKIN: No rash or lesion. MUSCULOSKELETAL: No joint pain or arthritis.   NEUROLOGIC: No tingling, numbness, weakness.  PSYCHIATRY: No anxiety or depression.   DRUG ALLERGIES:  No Known Allergies  VITALS:  Blood pressure 122/66, pulse 66, temperature (!) 97.5 F (36.4 C), temperature source Oral, resp. rate 18, height 5\' 5"  (1.651 m), weight 103.6 kg (228 lb 8 oz), last menstrual period 02/28/2017, SpO2 100 %.  PHYSICAL EXAMINATION:  GENERAL:  51 y.o.-year-old patient lying in the bed with no acute distress.  EYES: Pupils equal, round, reactive to light  / No scleral icterus. Extraocular muscles intact.  HEENT: Head atraumatic, normocephalic. Oropharynx and nasopharynx clear.  NECK:  Supple, no jugular venous distention. No thyroid enlargement, no tenderness.  LUNGS: Normal breath sounds bilaterally, no wheezing, rales,rhonchi or crepitation. No use of accessory muscles of respiration.  CARDIOVASCULAR: S1, S2 normal. No murmurs, rubs,  or gallops.  ABDOMEN: Slight tenderness in the right flank area.  EXTREMITIES: No pedal edema, cyanosis, or clubbing.  NEUROLOGIC: Cranial nerves II through XII are intact. Muscle strength 5/5 in all extremities. Sensation intact. Gait not checked.  PSYCHIATRIC: The patient is alert and oriented x 3.  SKIN: No obvious rash, lesion, or ulcer.    LABORATORY PANEL:   CBC  Recent Labs Lab 09/11/17 0459  WBC 6.1  HGB 9.5*  HCT 28.1*  PLT 314   ------------------------------------------------------------------------------------------------------------------  Chemistries   Recent Labs Lab 09/10/17 1405  09/12/17 0600  NA 134*  < > 137  K 3.6  < > 4.0  CL 100*  < > 107  CO2 23  < > 23  GLUCOSE 95  < > 101*  BUN 42*  < > 37*  CREATININE 3.31*  < > 2.01*  CALCIUM 9.7  < > 9.0  AST 17  --   --   ALT 12*  --   --   ALKPHOS 92  --   --   BILITOT 0.5  --   --   < > = values in this interval not displayed. ------------------------------------------------------------------------------------------------------------------  Cardiac Enzymes No results for input(s): TROPONINI in the last 168 hours. ------------------------------------------------------------------------------------------------------------------  RADIOLOGY:  Ct Abdomen Pelvis Wo Contrast  Result Date: 09/10/2017 CLINICAL DATA:  Right lower quadrant pain.  Cervical carcinoma. EXAM: CT ABDOMEN AND PELVIS WITHOUT CONTRAST TECHNIQUE: Multidetector CT imaging of the abdomen and pelvis was performed following the standard protocol without IV contrast. COMPARISON:  08/12/2017 FINDINGS: Lower chest: No acute findings. Prior cholecystectomy. No evidence of biliary dilatation. Hepatobiliary:  No mass  visualized on this unenhanced exam. Pancreas: No mass or inflammatory process visualized on this unenhanced exam. Spleen:  Within normal limits in size. Adrenals/Urinary tract: Right-sided percutaneous nephrostomy tube remains in  appropriate position. No evidence of hydronephrosis. Stomach/Bowel: No evidence of obstruction, inflammatory process, or abnormal fluid collections. Diverticulosis mainly involving the descending sigmoid colon, without evidence of diverticulitis. Vascular/Lymphatic: No pathologically enlarged lymph nodes identified. No evidence of abdominal aortic aneurysm. Aortic atherosclerosis. Reproductive: Stable ill-defined cervical mass extending into right parametrial soft tissues. No other pelvic or adnexal mass identified. No evidence of free fluid. Other:  Stable small paraumbilical hernia containing only fat. Musculoskeletal:  No suspicious bone lesions identified. IMPRESSION: No acute findings. Stable ill-defined cervical mass extending into right parametrial soft tissues. No new or progressive disease identified. Colonic diverticulosis, without radiographic evidence of diverticulitis. Electronically Signed   By: Earle Gell M.D.   On: 09/10/2017 21:29    EKG:   Orders placed or performed during the hospital encounter of 06/27/17  . ED EKG  . ED EKG  . ED EKG 12-Lead  . ED EKG 12-Lead    ASSESSMENT AND PLAN:   #1 acute kidney injury ATN secondary to dehydration: Improving with IV fluids. Nephrology is following.Discontinue IV fluids if patient by mouth intake is good. #2. Persistent abdominal pain, right flank pain: Due to constipation: Improved with lactulose.   #3 history of cervical cancer, infiltrated the right distal ureters/p right-sided hydronephrosis, status post right nephrostomy: Followed by urology.; His fentanyl patch for the pain control. Patient completed chemotherapy and radiation for cervical cancer in April 2018.  #3. history of UTI with the diphtheroids: Urine culture sent September showed diphtheroids. Seen by ID, started Zosyn, wait for full urine culture results.   For intractable nausea, vomiting: Patient denies any nausea or vomiting today. Can have GI as an  outpatient.  Likely discharge tomorrow.  All the records are reviewed and case discussed with Care Management/Social Workerr. Management plans discussed with the patient, family and they are in agreement.  CODE STATUS: Full code  TOTAL TIME TAKING CARE OF THIS PATIENT: 35 minutes.   POSSIBLE D/C IN 1-2DAYS, DEPENDING ON CLINICAL CONDITION.   Epifanio Lesches M.D on 09/12/2017 at 12:52 PM  Between 7am to 6pm - Pager - [phone]  After 6pm go to [email or web address] - password [email or web address] [place] [facility]  Office  [phone]  CC: Primary care physician; [name], MD   Note: This dictation was prepared with Dragon dictation along with smaller phrase technology. Any transcriptional errors that result from this process are unintentional.

## 2017-09-12 NOTE — Progress Notes (Signed)
Pharmacy Antibiotic Note  Patricia Rojas is a 51 y.o. female admitted on 09/10/2017 with UTI.  Pharmacy has been consulted for fluconazole dosing.  Plan: fluconazole 100mg  iv q24h   Height: 5\' 5"  (165.1 cm) Weight: 228 lb 8 oz (103.6 kg) IBW/kg (Calculated) : 57  Temp (24hrs), Avg:97.6 F (36.4 C), Min:97.5 F (36.4 C), Max:97.9 F (36.6 C)   Recent Labs Lab 09/10/17 1405 09/11/17 0459 09/12/17 0600  WBC 7.9 6.1  --   CREATININE 3.31* 2.63* 2.01*    Estimated Creatinine Clearance: 39.5 mL/min (A) (by C-G formula based on SCr of 2.01 mg/dL (H)).    No Known Allergies  Antimicrobials this admission: 10/1 levaquin >> 10/1     10/2 zosyn >>  10/3 fluconazole >>   Microbiology results:  Results for orders placed or performed during the hospital encounter of 09/10/17  Urine Culture     Status: Abnormal   Collection Time: 09/11/17 11:52 AM  Result Value Ref Range Status   Specimen Description URINE, RANDOM  Final   Special Requests NONE  Final   Culture >=100,000 COLONIES/mL YEAST (A)  Final   Report Status 09/12/2017 FINAL  Final    Thank you for allowing pharmacy to be a part of this patient's care.  Thomasenia Sales, PharmD, MBA, [facility] Clinical Pharmacist [facility]   09/12/2017 2:56 PM

## 2017-09-12 NOTE — Progress Notes (Signed)
[place] INFECTIOUS DISEASE PROGRESS NOTE Date of Admission:  09/10/2017     ID: LILIANNA CASE is a 51 y.o. female with cervical cancer, ARF NV, UTI, nephrostomy  Active Problems:   Acute kidney injury ([place])   Subjective: No fevers, feels a little better. Some diarrhea/  ROS  Eleven systems are reviewed and negative except per hpi  Medications:  Antibiotics Given (last 72 hours)    Date/Time Action Medication Dose Rate   09/10/17 2008 New Bag/Given   levofloxacin (LEVAQUIN) IVPB 750 mg 750 mg 100 mL/hr   09/11/17 1645 New Bag/Given   piperacillin-tazobactam (ZOSYN) IVPB 3.375 g 3.375 g 12.5 mL/hr   09/11/17 2145 New Bag/Given   piperacillin-tazobactam (ZOSYN) IVPB 3.375 g 3.375 g 12.5 mL/hr   09/12/17 2993 New Bag/Given   piperacillin-tazobactam (ZOSYN) IVPB 3.375 g 3.375 g 12.5 mL/hr     . enoxaparin (LOVENOX) injection  30 mg Subcutaneous Q24H  . fentaNYL  25 mcg Transdermal Q72H  . polyethylene glycol  17 g Oral Daily  . protein supplement  2 oz Oral BID    Objective: Vital signs in last 24 hours: Temp:  [97.5 F (36.4 C)-97.9 F (36.6 C)] 97.5 F (36.4 C) (10/03 1351) Pulse Rate:  [60-66] 60 (10/03 1351) Resp:  [16-20] 16 (10/03 1351) BP: (108-129)/(58-81) 108/58 (10/03 1351) SpO2:  [100 %] 100 % (10/03 1351) Constitutional:  oriented to person, place, and time. appears well-developed and well-nourished. No distress.  HENT: /AT, PERRLA, no scleral icterus Mouth/Throat: Oropharynx is clear and moist. No oropharyngeal exudate.  Cardiovascular: Normal rate, regular rhythm and normal heart sounds. Exam reveals no gallop and no friction rub.  No murmur heard.  Pulmonary/Chest: Effort normal and breath sounds normal. No respiratory distress.  has no wheezes.  Neck = supple, no nuchal rigidity Abdominal: Soft. Bowel sounds are normal.  exhibits no distension. There is no tenderness.  R nephrostomy tube with clear urine  Lymphadenopathy: no cervical adenopathy.  No axillary adenopathy Neurological: alert and oriented to person, place, and time.  Skin: Skin is warm and dry. No rash noted. No erythema.  Psychiatric: a normal mood and affect.  behavior is normal.   Lab Results  Recent Labs  09/10/17 1405 09/11/17 0459 09/12/17 0600  WBC 7.9 6.1  --   HGB 10.5* 9.5*  --   HCT 30.6* 28.1*  --   NA 134* 136 137  K 3.6 4.5 4.0  CL 100* 105 107  CO2 23 22 23   BUN 42* 40* 37*  CREATININE 3.31* 2.63* 2.01*    Microbiology: Results for orders placed or performed during the hospital encounter of 09/10/17  Urine Culture     Status: Abnormal   Collection Time: 09/11/17 11:52 AM  Result Value Ref Range Status   Specimen Description URINE, RANDOM  Final   Special Requests NONE  Final   Culture >=100,000 COLONIES/mL YEAST (A)  Final   Report Status 09/12/2017 FINAL  Final    Studies/Results: Ct Abdomen Pelvis Wo Contrast  Result Date: 09/10/2017 CLINICAL DATA:  Right lower quadrant pain.  Cervical carcinoma. EXAM: CT ABDOMEN AND PELVIS WITHOUT CONTRAST TECHNIQUE: Multidetector CT imaging of the abdomen and pelvis was performed following the standard protocol without IV contrast. COMPARISON:  08/12/2017 FINDINGS: Lower chest: No acute findings. Prior cholecystectomy. No evidence of biliary dilatation. Hepatobiliary:  No mass visualized on this unenhanced exam. Pancreas: No mass or inflammatory process visualized on this unenhanced exam. Spleen:  Within normal limits in size. Adrenals/Urinary  tract: Right-sided percutaneous nephrostomy tube remains in appropriate position. No evidence of hydronephrosis. Stomach/Bowel: No evidence of obstruction, inflammatory process, or abnormal fluid collections. Diverticulosis mainly involving the descending sigmoid colon, without evidence of diverticulitis. Vascular/Lymphatic: No pathologically enlarged lymph nodes identified. No evidence of abdominal aortic aneurysm. Aortic atherosclerosis. Reproductive: Stable  ill-defined cervical mass extending into right parametrial soft tissues. No other pelvic or adnexal mass identified. No evidence of free fluid. Other:  Stable small paraumbilical hernia containing only fat. Musculoskeletal:  No suspicious bone lesions identified. IMPRESSION: No acute findings. Stable ill-defined cervical mass extending into right parametrial soft tissues. No new or progressive disease identified. Colonic diverticulosis, without radiographic evidence of diverticulitis. Electronically Signed   By: Earle Gell M.D.   On: 09/10/2017 21:29    Assessment/Plan: Patricia Rojas is a 51 y.o. female with cervical cancer and obstructive hydronephrosis with nephrostomy tubes in place and recurrent UTIs, now with dehydration and ARF from recurrent NV and poor po intake.  Her UA is + and ucx pending. However does not have leukocytosis nor fever. Urology has seen and had CT scan and tube seems in appropriate position.  Unclear if the NV is from possible UTI but would wu other causes.  One UCX is neg, other with > 100 K yeast which I suspect is the one drawn from her nephrostomy tube.   Recommendations Continue zosyn Add fluconazole Continue management of her NV and ARF Thank you very much for the consult. Will follow with you.  [name]   09/12/2017, 1:56 PM

## 2017-09-12 NOTE — Plan of Care (Signed)
Problem: Activity: Goal: Risk for activity intolerance will decrease Outcome: Progressing Pt ambulated around nursing station twice and tolerated well.

## 2017-09-13 ENCOUNTER — Telehealth: Payer: Self-pay | Admitting: Urology

## 2017-09-13 MED ORDER — FLUCONAZOLE 100 MG PO TABS: 100.0000 mg | ORAL_TABLET | Freq: Every day | ORAL | 0 refills | Status: DC

## 2017-09-13 MED ORDER — LACTULOSE 10 GM/15ML PO SOLN: 30.0000 g | Freq: Two times a day (BID) | ORAL | 0 refills | Status: DC | PRN

## 2017-09-13 MED ORDER — FENTANYL 25 MCG/HR TD PT72: 25.0000 ug | MEDICATED_PATCH | TRANSDERMAL | 0 refills | Status: DC

## 2017-09-13 MED ORDER — ENOXAPARIN SODIUM 40 MG/0.4ML ~~LOC~~ SOLN: 40.0000 mg | SUBCUTANEOUS | Status: DC

## 2017-09-13 MED ORDER — HYDROCODONE-ACETAMINOPHEN 5-325 MG PO TABS: 1.0000 | ORAL_TABLET | ORAL | 0 refills | Status: DC | PRN

## 2017-09-13 NOTE — Progress Notes (Signed)
Discharge instructions given and went over with patient at bedside. Printed prescriptions given for all medications. All questions answered. Patient discharged home with family via wheelchair by volunteer services. Madlyn Frankel, RN

## 2017-09-13 NOTE — Progress Notes (Signed)
Medications administered by student RN [id number] with supervision of Clinical Instructor Lynann Bologna MSN, RN-BC or patient's assigned RN.

## 2017-09-13 NOTE — Telephone Encounter (Signed)
Received a call from hospitalist, Dr. Ola Spurr recommending nephrostomy tube exchange was in 2 weeks as outpatient prior to completing her fluconazole. We'll help facilitate this.  Hollice Espy, MD

## 2017-09-14 ENCOUNTER — Telehealth: Payer: Self-pay | Admitting: *Deleted

## 2017-09-14 ENCOUNTER — Other Ambulatory Visit: Payer: Self-pay | Admitting: Radiology

## 2017-09-14 DIAGNOSIS — N1330 Unspecified hydronephrosis: Secondary | ICD-10-CM

## 2017-09-14 MED ORDER — PROMETHAZINE HCL 25 MG PO TABS: 25.0000 mg | ORAL_TABLET | Freq: Four times a day (QID) | ORAL | 1 refills | Status: DC | PRN

## 2017-09-14 NOTE — Telephone Encounter (Signed)
PO Phenergan 25 mg every 6 hours as needed.  Thanks!

## 2017-09-14 NOTE — Telephone Encounter (Signed)
Patient states Dr Grayland Ormond was going to give her a prescription for Phenergan tablets, "NOT suppositories". She is out of the hospital. Please advise and order what you want her to have if in agreement

## 2017-09-17 ENCOUNTER — Telehealth: Payer: Self-pay | Admitting: *Deleted

## 2017-09-17 MED ORDER — HYDROCODONE-ACETAMINOPHEN 5-325 MG PO TABS: 1.0000 | ORAL_TABLET | ORAL | 0 refills | Status: DC | PRN

## 2017-09-17 NOTE — Discharge Summary (Signed)
Patricia Rojas, is a 51 y.o. female  DOB 24-Mar-1966  MRN [id number].  Admission date:  09/10/2017  Admitting Physician  Bettey Costa, MD  Discharge Date:  09/13/2017   Primary MD  [name], MD  Recommendations for primary care physician for things to follow:   Follow with pcp in one week   Admission Diagnosis  Urinary Tract Infection Intractable Pain Acute Renal Failure   Discharge Diagnosis  Urinary Tract Infection Intractable Pain Acute Renal Failure   Active Problems:   Acute kidney injury [facility])      Past Medical History:  Diagnosis Date  . Cancer (HCC)    cervical  . Constipation   . Hemorrhoids   . N&V (nausea and vomiting)   . PONV (postoperative nausea and vomiting)     Past Surgical History:  Procedure Laterality Date  . CESAREAN SECTION  1986  . CHOLECYSTECTOMY    . HERNIA REPAIR    . IR GENERIC HISTORICAL  02/23/2017   IR NEPHROSTOMY PLACEMENT RIGHT 02/23/2017 [name], MD [facility]INTER[facility] RAD  . IR NEPHROSTOMY EXCHANGE RIGHT  04/18/2017  . IR NEPHROSTOMY EXCHANGE RIGHT  06/08/2017  . IR NEPHROSTOMY EXCHANGE RIGHT  08/17/2017       History of present illness and  Hospital Course:     Kindly see H&P for history of present illness and admission details, please review complete Labs, Consult reports and Test reports for all details in brief  HPI  from the history and physical done on the day of admission Patricia Rojas  is a 51 y.o. female with a known history of Squamous cell carcinoma of cervix and right hydronephrosis status post nephrostomy tube who presented to Dr. Ola Spurr office for routine visit. Patient was complaining of nausea and vomiting and had recommended to see the oncologist. Labs are drawn and patient's creatinine is 3.3 from baseline 1.4-1.7.. Admitted for acute renal  failure/   Hospital Course  Acute renal failure due to Acute kidney injury;improved with iV fluids.seen by nephrology.Pt had right abdominal pian,Nausea,vomiting,unable to keep anything down for 3 days. 2.h/oCervical cancer with Right side hydronephrosis s/p nephrostomy tube;had CT abdomen for abdominal pain,nausea,CT-scan of the abdomen showed nephrostomy tube in nephrostomy tube was exchnaged o 08/18/27.urine  Cultures showed yeast,and discharged home with diflucan 100 mg daily for 13 days.pt received one dose diflucan while in hospital.Dr.Fitzgeral recommended nephrostomy tube needs to be changed before she finishes treatment for antifungal therapy for yeast UTI.same is discussed with DR.Erlene Quan from urology. 3.h/o squamous cell carcinoma of cervix;use fentanyl patch for pain control;   Discharge Condition: stable   Follow UP      Discharge Instructions  and  Discharge Medications     Allergies as of 09/13/2017   No Known Allergies     Medication List    STOP taking these medications   nitrofurantoin 100 MG capsule Commonly known as:  MACRODANTIN   Oxycodone HCl 10 MG Tabs     TAKE these medications   ALPRAZolam 0.25 MG tablet Commonly known as:  XANAX Take 1-2 tabs daily as needed   amLODipine 2.5 MG tablet Commonly known as:  NORVASC Take 1 tablet (2.5 mg total) by mouth daily.   fentaNYL 25 MCG/HR patch Commonly known as:  DURAGESIC - dosed mcg/hr Place 1 patch (25 mcg total) onto the skin every 3 (three) days.   fluconazole 100 MG tablet Commonly known as:  DIFLUCAN Take 1 tablet (100 mg total) by mouth daily.   lactulose  10 GM/15ML solution Commonly known as:  CHRONULAC Take 45 mLs (30 g total) by mouth 2 (two) times daily as needed for mild constipation.   prochlorperazine 10 MG tablet Commonly known as:  COMPAZINE Take 1 tablet (10 mg total) by mouth every 6 (six) hours as needed for nausea or vomiting.   promethazine 25 MG suppository Commonly known  as:  PHENERGAN Place 1 suppository (25 mg total) rectally every 6 (six) hours as needed for nausea or vomiting.         Diet and Activity recommendation: See Discharge Instructions above   Consults obtained -urology,nephrology,ID   Major procedures and Radiology Reports - PLEASE review detailed and final reports for all details, in brief -      Ct Abdomen Pelvis Wo Contrast  Result Date: 09/10/2017 CLINICAL DATA:  Right lower quadrant pain.  Cervical carcinoma. EXAM: CT ABDOMEN AND PELVIS WITHOUT CONTRAST TECHNIQUE: Multidetector CT imaging of the abdomen and pelvis was performed following the standard protocol without IV contrast. COMPARISON:  08/12/2017 FINDINGS: Lower chest: No acute findings. Prior cholecystectomy. No evidence of biliary dilatation. Hepatobiliary:  No mass visualized on this unenhanced exam. Pancreas: No mass or inflammatory process visualized on this unenhanced exam. Spleen:  Within normal limits in size. Adrenals/Urinary tract: Right-sided percutaneous nephrostomy tube remains in appropriate position. No evidence of hydronephrosis. Stomach/Bowel: No evidence of obstruction, inflammatory process, or abnormal fluid collections. Diverticulosis mainly involving the descending sigmoid colon, without evidence of diverticulitis. Vascular/Lymphatic: No pathologically enlarged lymph nodes identified. No evidence of abdominal aortic aneurysm. Aortic atherosclerosis. Reproductive: Stable ill-defined cervical mass extending into right parametrial soft tissues. No other pelvic or adnexal mass identified. No evidence of free fluid. Other:  Stable small paraumbilical hernia containing only fat. Musculoskeletal:  No suspicious bone lesions identified. IMPRESSION: No acute findings. Stable ill-defined cervical mass extending into right parametrial soft tissues. No new or progressive disease identified. Colonic diverticulosis, without radiographic evidence of diverticulitis. Electronically  Signed   By: Earle Gell M.D.   On: 09/10/2017 21:29    Micro Results    Recent Results (from the past 240 hour(s))  Urine Culture     Status: Abnormal   Collection Time: 09/10/17  1:54 PM  Result Value Ref Range Status   Specimen Description URINE, CLEAN CATCH  Final   Special Requests NONE  Final   Culture (A)  Final    <10,000 COLONIES/mL Performed at [facility], [place] [address], [place], [place] [address]    Report Status 09/12/2017 FINAL  Final  Urine Culture     Status: Abnormal   Collection Time: 09/11/17 11:52 AM  Result Value Ref Range Status   Specimen Description URINE, RANDOM  Final   Special Requests NONE  Final   Culture >=100,000 COLONIES/mL YEAST (A)  Final   Report Status 09/12/2017 FINAL  Final       Today   Subjective:   Patricia Rojas today has no headache,no chest abdominal pain,no new weakness tingling or numbness, feels much better wants to go home today.  Objective:   Blood pressure 110/73, pulse 68, temperature 98 F (36.7 C), temperature source Oral, resp. rate 14, height 5\' 5"  (1.651 m), weight 103.6 kg (228 lb 8 oz), last menstrual period 02/28/2017, SpO2 100 %.  No intake or output data in the 24 hours ending 09/17/17 2137  Exam Awake Alert, Oriented x 3, No new F.N deficits, Normal affect [place].AT,PERRAL Supple Neck,No JVD, No cervical lymphadenopathy appriciated.  Symmetrical Chest wall  movement, Good air movement bilaterally, CTAB RRR,No Gallops,Rubs or new Murmurs, No Parasternal Heave +ve B.Sounds, Abd Soft, Non tender, No organomegaly appriciated, No rebound -guarding or rigidity. No Cyanosis, Clubbing or edema, No new Rash or bruise  Data Review   CBC w Diff:  Lab Results  Component Value Date   WBC 6.1 09/11/2017   HGB 9.5 (L) 09/11/2017   HGB 13.0 06/05/2013   HCT 28.1 (L) 09/11/2017   HCT 39.3 06/05/2013   PLT 314 09/11/2017   PLT 290 06/05/2013   LYMPHOPCT 11 09/10/2017   MONOPCT 6 09/10/2017   EOSPCT 2 09/10/2017    BASOPCT 1 09/10/2017    CMP:  Lab Results  Component Value Date   NA 137 09/12/2017   NA 143 06/05/2013   K 4.0 09/12/2017   K 3.3 (L) 06/05/2013   CL 107 09/12/2017   CL 110 (H) 06/05/2013   CO2 23 09/12/2017   CO2 27 06/05/2013   BUN 37 (H) 09/12/2017   BUN 9 06/05/2013   CREATININE 2.01 (H) 09/12/2017   CREATININE 0.81 06/05/2013   PROT 9.2 (H) 09/10/2017   ALBUMIN 3.8 09/10/2017   BILITOT 0.5 09/10/2017   ALKPHOS 92 09/10/2017   AST 17 09/10/2017   ALT 12 (L) 09/10/2017  .   Total Time in preparing paper work, data evaluation and todays exam - 42 minutes  Patricia Rojas M.D on 09/13/2017 at 9:37 PM    Note: This dictation was prepared with Dragon dictation along with smaller phrase technology. Any transcriptional errors that result from this process are unintentional.

## 2017-09-17 NOTE — Telephone Encounter (Signed)
Discussed with Dr Grayland Ormond, he states that if she is using max dosing of Norco, he wants to go up on her Fentanyl, and that it is ok to give #90 of the Norco refill.  Per patient, she is using 6 Norco per day, mostly at night. She does not need refill of Fentanyl at this time

## 2017-09-17 NOTE — Telephone Encounter (Signed)
Patient called requesting refill of her Fentanyl and Norco of which she got # 5 patches Fentanyl and #30 tabs of Norco on 10/4. Please advise

## 2017-09-17 NOTE — Telephone Encounter (Signed)
Per Dr Grayland Ormond, double the Fentanyl dose to50 mcg. Patient informed and she will add another patch today(she changed er patch yesterday) She will send someone to pick up prescription for hydrocodone

## 2017-09-17 NOTE — Telephone Encounter (Signed)
Yes, she can have 30 day refill.  Thank you.

## 2017-09-20 ENCOUNTER — Telehealth: Payer: Self-pay | Admitting: *Deleted

## 2017-09-20 MED ORDER — FENTANYL 50 MCG/HR TD PT72: 50.0000 ug | MEDICATED_PATCH | TRANSDERMAL | 0 refills | Status: DC

## 2017-09-20 NOTE — Telephone Encounter (Signed)
Patient called back and states 50 mcg of fentanyl is controlling her pain and she needs new prescription

## 2017-09-28 ENCOUNTER — Ambulatory Visit: Admission: RE | Admit: 2017-09-28 | Payer: Self-pay | Source: Ambulatory Visit

## 2017-09-29 NOTE — Progress Notes (Signed)
[facility]  [phone]) [phone] [phone]) [phone]  ID: Patricia Rojas OB: August 02, 1966  MR#: [id number]  JME#:[id number]  Patient Care Team: [name], MD as PCP - General (Infectious Diseases) Clent Jacks, RN as Registered Nurse  CHIEF COMPLAINT: Squamous cell carcinoma of the cervix.  INTERVAL HISTORY: Patient returns to clinic today for further evaluation, laboratory work, in hospital follow-up. She feels significantly improved and her pain is much better controlled. She continues to have good days and bad days. She continues to have a poor appetite with increased nausea. She has no neurologic complaints. She denies any recent fevers or illnesses. She has no chest pain or shortness of breath. She denies any constipation or diarrhea. She has no urinary complaints. Patient offers no further specific complaints today.  REVIEW OF SYSTEMS:   Review of Systems  Constitutional: Positive for malaise/fatigue. Negative for fever and weight loss.  Respiratory: Negative.  Negative for cough and shortness of breath.   Cardiovascular: Negative.  Negative for chest pain and leg swelling.  Gastrointestinal: Positive for nausea. Negative for abdominal pain, constipation and diarrhea.  Genitourinary: Negative.  Negative for flank pain.  Skin: Negative.  Negative for rash.  Neurological: Positive for weakness.  Psychiatric/Behavioral: Positive for depression. The patient is nervous/anxious.     As per HPI. Otherwise, a complete review of systems is negative.  PAST MEDICAL HISTORY: Past Medical History:  Diagnosis Date  . Cancer (HCC)    cervical  . Constipation   . Hemorrhoids   . N&V (nausea and vomiting)   . PONV (postoperative nausea and vomiting)     PAST SURGICAL HISTORY: Past Surgical History:  Procedure Laterality Date  . CESAREAN SECTION  1986  . CHOLECYSTECTOMY    . HERNIA REPAIR    . IR GENERIC HISTORICAL  02/23/2017   IR NEPHROSTOMY  PLACEMENT RIGHT 02/23/2017 [name], MD [facility]-INTER[facility] RAD  . IR NEPHROSTOMY EXCHANGE RIGHT  04/18/2017  . IR NEPHROSTOMY EXCHANGE RIGHT  06/08/2017  . IR NEPHROSTOMY EXCHANGE RIGHT  08/17/2017    FAMILY HISTORY: Family History  Problem Relation Age of Onset  . Cancer Maternal Uncle   . Cancer Paternal Aunt   . Kidney cancer Neg Hx   . Bladder Cancer Neg Hx     ADVANCED DIRECTIVES (Y/N):  N  HEALTH MAINTENANCE: Social History  Substance Use Topics  . Smoking status: Former Smoker    Types: Cigarettes    Quit date: 51  . Smokeless tobacco: Never Used  . Alcohol use No     Colonoscopy:  PAP:  Bone density:  Lipid panel:  No Known Allergies  Current Outpatient Prescriptions  Medication Sig Dispense Refill  . ALPRAZolam (XANAX) 0.25 MG tablet Take 1-2 tabs daily as needed 60 tablet 0  . amLODipine (NORVASC) 2.5 MG tablet Take 1 tablet (2.5 mg total) by mouth daily. 30 tablet 0  . fentaNYL (DURAGESIC - DOSED MCG/HR) 50 MCG/HR Place 1 patch (50 mcg total) onto the skin every 3 (three) days. 10 patch 0  . HYDROcodone-acetaminophen (NORCO/VICODIN) 5-325 MG tablet Take 1-2 tablets by mouth every 4 (four) hours as needed for moderate pain. 90 tablet 0  . lactulose (CHRONULAC) 10 GM/15ML solution Take 45 mLs (30 g total) by mouth 2 (two) times daily as needed for mild constipation. 240 mL 0  . prochlorperazine (COMPAZINE) 10 MG tablet Take 1 tablet (10 mg total) by mouth every 6 (six) hours as needed for nausea or vomiting. 30 tablet 1  .  promethazine (PHENERGAN) 25 MG tablet Take 1 tablet (25 mg total) by mouth every 6 (six) hours as needed for nausea or vomiting. 30 tablet 1   No current facility-administered medications for this visit.     OBJECTIVE: Vitals:   10/01/17 1422  BP: (!) 147/93  Pulse: 91  Resp: 18  Temp: 97.8 F (36.6 C)     Body mass index is 39.47 kg/m.    ECOG FS:1 - Symptomatic but completely ambulatory  General: Well-developed, well-nourished, no  acute distress. Eyes: Pink conjunctiva, anicteric sclera. Lungs: Clear to auscultation bilaterally. Heart: Regular rate and rhythm. No rubs, murmurs, or gallops. Abdomen: Soft, nontender, nondistended. No organomegaly noted, normoactive bowel sounds. GU: Exam recently performed at [facility]. Musculoskeletal: No edema, cyanosis, or clubbing. Neuro: Alert, answering all questions appropriately. Cranial nerves grossly intact. Skin: No rashes or petechiae noted. Psych: Normal affect.   LAB RESULTS:  Lab Results  Component Value Date   NA 138 10/01/2017   K 4.3 10/01/2017   CL 101 10/01/2017   CO2 24 10/01/2017   GLUCOSE 104 (H) 10/01/2017   BUN 23 (H) 10/01/2017   CREATININE 2.01 (H) 10/01/2017   CALCIUM 9.6 10/01/2017   PROT 9.2 (H) 09/10/2017   ALBUMIN 3.8 09/10/2017   AST 17 09/10/2017   ALT 12 (L) 09/10/2017   ALKPHOS 92 09/10/2017   BILITOT 0.5 09/10/2017   GFRNONAA 28 (L) 10/01/2017   GFRAA 32 (L) 10/01/2017    Lab Results  Component Value Date   WBC 6.1 10/01/2017   NEUTROABS 4.4 10/01/2017   HGB 10.1 (L) 10/01/2017   HCT 30.7 (L) 10/01/2017   MCV 79.8 (L) 10/01/2017   PLT 361 10/01/2017     STUDIES: Ct Abdomen Pelvis Wo Contrast  Result Date: 09/10/2017 CLINICAL DATA:  Right lower quadrant pain.  Cervical carcinoma. EXAM: CT ABDOMEN AND PELVIS WITHOUT CONTRAST TECHNIQUE: Multidetector CT imaging of the abdomen and pelvis was performed following the standard protocol without IV contrast. COMPARISON:  08/12/2017 FINDINGS: Lower chest: No acute findings. Prior cholecystectomy. No evidence of biliary dilatation. Hepatobiliary:  No mass visualized on this unenhanced exam. Pancreas: No mass or inflammatory process visualized on this unenhanced exam. Spleen:  Within normal limits in size. Adrenals/Urinary tract: Right-sided percutaneous nephrostomy tube remains in appropriate position. No evidence of hydronephrosis. Stomach/Bowel: No evidence of obstruction,  inflammatory process, or abnormal fluid collections. Diverticulosis mainly involving the descending sigmoid colon, without evidence of diverticulitis. Vascular/Lymphatic: No pathologically enlarged lymph nodes identified. No evidence of abdominal aortic aneurysm. Aortic atherosclerosis. Reproductive: Stable ill-defined cervical mass extending into right parametrial soft tissues. No other pelvic or adnexal mass identified. No evidence of free fluid. Other:  Stable small paraumbilical hernia containing only fat. Musculoskeletal:  No suspicious bone lesions identified. IMPRESSION: No acute findings. Stable ill-defined cervical mass extending into right parametrial soft tissues. No new or progressive disease identified. Colonic diverticulosis, without radiographic evidence of diverticulitis. Electronically Signed   By: Earle Gell M.D.   On: 09/10/2017 21:29    ASSESSMENT: Squamous cell carcinoma of the cervix.  PLAN:    1. Squamous cell carcinoma of the cervix: Patient completed concurrent chemotherapy and XRT in April 2018. Brachytherapy at [facility] has also been completed. CT scan results from September 10, 2017 reviewed independently and reported as above with a stable ill-defined cervical mass and no new or progressive disease identified.  Although patient had PET scan at [facility] on July 25, 2017 that was possibly suggestive of residual malignancy.  No intervention or treatment planned at this time, but patient has a repeat PET scan at [facility] in November 2018. Return to clinic in 1 month for further evaluation.  2. Pain: continue fentanyl 50 g every 3 days with oxycodone as needed. 3. Nausea: Resolved. Continue Compazine and Phenergan suppositories as needed.  4. Constipation: Continue MiraLAX as needed. 5. Anxiety: Continue Xanax as needed. 6. Renal insufficiency: Right nephrostomy tube in place. Patient reports she has a tube replacement scheduled in the next 1-2 weeks. 7.  Hypertension: Patient was given a refill for her amlodipine today. She was also given a referral to primary care for further management and evaluation.   Patient expressed understanding and was in agreement with this plan. She also understands that She can call clinic at any time with any questions, concerns, or complaints.   Cancer Staging Malignant neoplasm of cervix [facility]) Staging form: Corpus Uteri - Carcinoma and Carcinosarcoma, AJCC 8th Edition - Clinical stage from 02/18/2017: FIGO Stage IIIC1 (cT3b, cN1, cM0) - Signed by Lloyd Huger, MD on 02/18/2017   Lloyd Huger, MD   10/01/2017 2:47 PM

## 2017-10-01 ENCOUNTER — Inpatient Hospital Stay: Payer: Medicaid Other

## 2017-10-01 ENCOUNTER — Encounter (INDEPENDENT_AMBULATORY_CARE_PROVIDER_SITE_OTHER): Payer: Self-pay

## 2017-10-01 ENCOUNTER — Inpatient Hospital Stay (HOSPITAL_BASED_OUTPATIENT_CLINIC_OR_DEPARTMENT_OTHER): Payer: Medicaid Other | Admitting: Oncology

## 2017-10-01 VITALS — BP 147/93 | HR 91 | Temp 97.8°F | Resp 18 | Wt 237.2 lb

## 2017-10-01 DIAGNOSIS — I700 Atherosclerosis of aorta: Secondary | ICD-10-CM

## 2017-10-01 DIAGNOSIS — F419 Anxiety disorder, unspecified: Secondary | ICD-10-CM

## 2017-10-01 DIAGNOSIS — R112 Nausea with vomiting, unspecified: Secondary | ICD-10-CM | POA: Diagnosis not present

## 2017-10-01 DIAGNOSIS — R1011 Right upper quadrant pain: Secondary | ICD-10-CM

## 2017-10-01 DIAGNOSIS — E46 Unspecified protein-calorie malnutrition: Secondary | ICD-10-CM

## 2017-10-01 DIAGNOSIS — Z9221 Personal history of antineoplastic chemotherapy: Secondary | ICD-10-CM

## 2017-10-01 DIAGNOSIS — Z79899 Other long term (current) drug therapy: Secondary | ICD-10-CM | POA: Diagnosis not present

## 2017-10-01 DIAGNOSIS — Z8541 Personal history of malignant neoplasm of cervix uteri: Secondary | ICD-10-CM | POA: Diagnosis present

## 2017-10-01 DIAGNOSIS — N179 Acute kidney failure, unspecified: Secondary | ICD-10-CM

## 2017-10-01 DIAGNOSIS — D649 Anemia, unspecified: Secondary | ICD-10-CM | POA: Diagnosis not present

## 2017-10-01 DIAGNOSIS — F329 Major depressive disorder, single episode, unspecified: Secondary | ICD-10-CM | POA: Diagnosis not present

## 2017-10-01 DIAGNOSIS — K429 Umbilical hernia without obstruction or gangrene: Secondary | ICD-10-CM

## 2017-10-01 DIAGNOSIS — C538 Malignant neoplasm of overlapping sites of cervix uteri: Secondary | ICD-10-CM

## 2017-10-01 DIAGNOSIS — K5900 Constipation, unspecified: Secondary | ICD-10-CM

## 2017-10-01 DIAGNOSIS — Z923 Personal history of irradiation: Secondary | ICD-10-CM

## 2017-10-01 DIAGNOSIS — Z809 Family history of malignant neoplasm, unspecified: Secondary | ICD-10-CM

## 2017-10-01 DIAGNOSIS — Z87891 Personal history of nicotine dependence: Secondary | ICD-10-CM | POA: Diagnosis not present

## 2017-10-01 DIAGNOSIS — K5730 Diverticulosis of large intestine without perforation or abscess without bleeding: Secondary | ICD-10-CM | POA: Diagnosis not present

## 2017-10-01 DIAGNOSIS — C55 Malignant neoplasm of uterus, part unspecified: Secondary | ICD-10-CM

## 2017-10-01 DIAGNOSIS — I10 Essential (primary) hypertension: Secondary | ICD-10-CM | POA: Diagnosis not present

## 2017-10-01 DIAGNOSIS — E871 Hypo-osmolality and hyponatremia: Secondary | ICD-10-CM

## 2017-10-01 LAB — BASIC METABOLIC PANEL
ANION GAP: 13 (ref 5–15)
BUN: 23 mg/dL — ABNORMAL HIGH (ref 6–20)
CALCIUM: 9.6 mg/dL (ref 8.9–10.3)
CO2: 24 mmol/L (ref 22–32)
Chloride: 101 mmol/L (ref 101–111)
Creatinine, Ser: 2.01 mg/dL — ABNORMAL HIGH (ref 0.44–1.00)
GFR, EST AFRICAN AMERICAN: 32 mL/min — AB (ref >60)
GFR, EST NON AFRICAN AMERICAN: 28 mL/min — AB (ref >60)
GLUCOSE: 104 mg/dL — AB (ref 65–99)
Potassium: 4.3 mmol/L (ref 3.5–5.1)
SODIUM: 138 mmol/L (ref 135–145)

## 2017-10-01 LAB — CBC WITH DIFFERENTIAL/PLATELET
BASOS PCT: 1 %
Basophils Absolute: 0.1 10*3/uL (ref 0–0.1)
EOS ABS: 0.3 10*3/uL (ref 0–0.7)
Eosinophils Relative: 5 %
HEMATOCRIT: 30.7 % — AB (ref 35.0–47.0)
HEMOGLOBIN: 10.1 g/dL — AB (ref 12.0–16.0)
LYMPHS ABS: 0.9 10*3/uL — AB (ref 1.0–3.6)
Lymphocytes Relative: 15 %
MCH: 26.1 pg (ref 26.0–34.0)
MCHC: 32.7 g/dL (ref 32.0–36.0)
MCV: 79.8 fL — ABNORMAL LOW (ref 80.0–100.0)
MONO ABS: 0.4 10*3/uL (ref 0.2–0.9)
MONOS PCT: 7 %
NEUTROS PCT: 72 %
Neutro Abs: 4.4 10*3/uL (ref 1.4–6.5)
Platelets: 361 10*3/uL (ref 150–440)
RBC: 3.85 MIL/uL (ref 3.80–5.20)
RDW: 15.8 % — AB (ref 11.5–14.5)
WBC: 6.1 10*3/uL (ref 3.6–11.0)

## 2017-10-01 MED ORDER — AMLODIPINE BESYLATE 2.5 MG PO TABS: 2.5000 mg | ORAL_TABLET | Freq: Every day | ORAL | 0 refills | Status: DC

## 2017-10-01 MED ORDER — PROCHLORPERAZINE MALEATE 10 MG PO TABS: 10.0000 mg | ORAL_TABLET | Freq: Four times a day (QID) | ORAL | 1 refills | Status: DC | PRN

## 2017-10-01 NOTE — Progress Notes (Signed)
Patient denies any concerns today.  

## 2017-10-05 ENCOUNTER — Ambulatory Visit
Admission: RE | Admit: 2017-10-05 | Discharge: 2017-10-05 | Disposition: A | Discharge status: Home / Self Care | Payer: Medicaid Other | Source: Ambulatory Visit | Attending: Urology | Admitting: Urology

## 2017-10-05 DIAGNOSIS — Z436 Encounter for attention to other artificial openings of urinary tract: Secondary | ICD-10-CM | POA: Insufficient documentation

## 2017-10-05 DIAGNOSIS — C539 Malignant neoplasm of cervix uteri, unspecified: Secondary | ICD-10-CM | POA: Insufficient documentation

## 2017-10-05 DIAGNOSIS — N1330 Unspecified hydronephrosis: Secondary | ICD-10-CM

## 2017-10-05 HISTORY — PX: IR NEPHROSTOMY EXCHANGE RIGHT: IMG6070

## 2017-10-05 MED ORDER — MIDAZOLAM HCL 2 MG/ML PO SYRP
2.0000 mg | ORAL_SOLUTION | Freq: Once | ORAL | Status: AC
  Administered 2017-10-05: 2 mg via ORAL

## 2017-10-05 MED ORDER — LIDOCAINE HCL (PF) 1 % IJ SOLN
INTRAMUSCULAR | Status: AC
Start: 2017-10-05 — End: 2017-10-05
  Filled 2017-10-05: qty 30

## 2017-10-05 MED ORDER — IOPAMIDOL (ISOVUE-300) INJECTION 61%
30.0000 mL | Freq: Once | INTRAVENOUS | Status: AC | PRN
  Administered 2017-10-05: 5 mL

## 2017-10-05 MED ORDER — MIDAZOLAM HCL 2 MG/ML PO SYRP
ORAL_SOLUTION | ORAL | Status: AC
  Administered 2017-10-05: 2 mg via ORAL
  Filled 2017-10-05: qty 4

## 2017-10-05 NOTE — Procedures (Signed)
Successful exchange of the right nephrostomy tube.  Patient given po Versed prior to procedure.  No blood loss and no immediate complication.

## 2017-10-10 LAB — SURGICAL PATHOLOGY

## 2017-10-11 ENCOUNTER — Other Ambulatory Visit: Payer: Self-pay | Admitting: *Deleted

## 2017-10-11 MED ORDER — HYDROCODONE-ACETAMINOPHEN 5-325 MG PO TABS: 1.0000 | ORAL_TABLET | ORAL | 0 refills | Status: DC | PRN

## 2017-10-12 ENCOUNTER — Encounter: Payer: Self-pay | Admitting: Oncology

## 2017-10-27 NOTE — Progress Notes (Deleted)
[place]  [phone]) [phone] [phone]) [phone]  ID: Patricia Rojas OB: May 21, 1966  MR#: [id number]  QBV#:[id number]  Patient Care Team: [name], MD as PCP - General (Infectious Diseases) Clent Jacks, RN as Registered Nurse  CHIEF COMPLAINT: Squamous cell carcinoma of the cervix.  INTERVAL HISTORY: Patient returns to clinic today for further evaluation, laboratory work, in hospital follow-up. She feels significantly improved and her pain is much better controlled. She continues to have good days and bad days. She continues to have a poor appetite with increased nausea. She has no neurologic complaints. She denies any recent fevers or illnesses. She has no chest pain or shortness of breath. She denies any constipation or diarrhea. She has no urinary complaints. Patient offers no further specific complaints today.  REVIEW OF SYSTEMS:   Review of Systems  Constitutional: Positive for malaise/fatigue. Negative for fever and weight loss.  Respiratory: Negative.  Negative for cough and shortness of breath.   Cardiovascular: Negative.  Negative for chest pain and leg swelling.  Gastrointestinal: Positive for nausea. Negative for abdominal pain, constipation and diarrhea.  Genitourinary: Negative.  Negative for flank pain.  Skin: Negative.  Negative for rash.  Neurological: Positive for weakness.  Psychiatric/Behavioral: Positive for depression. The patient is nervous/anxious.     As per HPI. Otherwise, a complete review of systems is negative.  PAST MEDICAL HISTORY: Past Medical History:  Diagnosis Date  . Cancer (HCC)    cervical  . Constipation   . Hemorrhoids   . N&V (nausea and vomiting)   . PONV (postoperative nausea and vomiting)     PAST SURGICAL HISTORY: Past Surgical History:  Procedure Laterality Date  . CESAREAN SECTION  1986  . CHOLECYSTECTOMY    . HERNIA REPAIR    . IR GENERIC HISTORICAL  02/23/2017   IR NEPHROSTOMY  PLACEMENT RIGHT 02/23/2017 [name], MD [facility]-INTERV RAD  . IR NEPHROSTOMY EXCHANGE RIGHT  04/18/2017  . IR NEPHROSTOMY EXCHANGE RIGHT  06/08/2017  . IR NEPHROSTOMY EXCHANGE RIGHT  08/17/2017  . IR NEPHROSTOMY EXCHANGE RIGHT  10/05/2017    FAMILY HISTORY: Family History  Problem Relation Age of Onset  . Cancer Maternal Uncle   . Cancer Paternal Aunt   . Kidney cancer Neg Hx   . Bladder Cancer Neg Hx     ADVANCED DIRECTIVES (Y/N):  N  HEALTH MAINTENANCE: Social History   Tobacco Use  . Smoking status: Former Smoker    Types: Cigarettes    Last attempt to quit: 1990    Years since quitting: 28.8  . Smokeless tobacco: Never Used  Substance Use Topics  . Alcohol use: No  . Drug use: No    Comment: stopped 3 weeks ago     Colonoscopy:  PAP:  Bone density:  Lipid panel:  No Known Allergies  Current Outpatient Medications  Medication Sig Dispense Refill  . ALPRAZolam (XANAX) 0.25 MG tablet Take 1-2 tabs daily as needed 60 tablet 0  . amLODipine (NORVASC) 2.5 MG tablet Take 1 tablet (2.5 mg total) by mouth daily. 30 tablet 0  . fentaNYL (DURAGESIC - DOSED MCG/HR) 50 MCG/HR Place 1 patch (50 mcg total) onto the skin every 3 (three) days. 10 patch 0  . HYDROcodone-acetaminophen (NORCO/VICODIN) 5-325 MG tablet Take 1-2 tablets by mouth every 4 (four) hours as needed for moderate pain. 90 tablet 0  . lactulose (CHRONULAC) 10 GM/15ML solution Take 45 mLs (30 g total) by mouth 2 (two) times daily as needed for  mild constipation. 240 mL 0  . prochlorperazine (COMPAZINE) 10 MG tablet Take 1 tablet (10 mg total) by mouth every 6 (six) hours as needed for nausea or vomiting. 30 tablet 1  . promethazine (PHENERGAN) 25 MG tablet Take 1 tablet (25 mg total) by mouth every 6 (six) hours as needed for nausea or vomiting. 30 tablet 1   No current facility-administered medications for this visit.     OBJECTIVE: There were no vitals filed for this visit.   There is no height or weight on  file to calculate BMI.    ECOG FS:1 - Symptomatic but completely ambulatory  General: Well-developed, well-nourished, no acute distress. Eyes: Pink conjunctiva, anicteric sclera. Lungs: Clear to auscultation bilaterally. Heart: Regular rate and rhythm. No rubs, murmurs, or gallops. Abdomen: Soft, nontender, nondistended. No organomegaly noted, normoactive bowel sounds. GU: Exam recently performed at [facility]. Musculoskeletal: No edema, cyanosis, or clubbing. Neuro: Alert, answering all questions appropriately. Cranial nerves grossly intact. Skin: No rashes or petechiae noted. Psych: Normal affect.   LAB RESULTS:  Lab Results  Component Value Date   NA 138 10/01/2017   K 4.3 10/01/2017   CL 101 10/01/2017   CO2 24 10/01/2017   GLUCOSE 104 (H) 10/01/2017   BUN 23 (H) 10/01/2017   CREATININE 2.01 (H) 10/01/2017   CALCIUM 9.6 10/01/2017   PROT 9.2 (H) 09/10/2017   ALBUMIN 3.8 09/10/2017   AST 17 09/10/2017   ALT 12 (L) 09/10/2017   ALKPHOS 92 09/10/2017   BILITOT 0.5 09/10/2017   GFRNONAA 28 (L) 10/01/2017   GFRAA 32 (L) 10/01/2017    Lab Results  Component Value Date   WBC 6.1 10/01/2017   NEUTROABS 4.4 10/01/2017   HGB 10.1 (L) 10/01/2017   HCT 30.7 (L) 10/01/2017   MCV 79.8 (L) 10/01/2017   PLT 361 10/01/2017     STUDIES: Ir Nephrostomy Exchange Right  Result Date: 10/05/2017 INDICATION: 51 year old with cervical cancer. Patient presents for routine exchange of the right percutaneous nephrostomy tube. EXAM: RIGHT PERCUTANEOUS NEPHROSTOMY TUBE EXCHANGE WITH FLUOROSCOPY Physician: Stephan Minister. Anselm Pancoast, MD COMPARISON:  None. MEDICATIONS: None ANESTHESIA/SEDATION: Versed 2 mg p.o. CONTRAST:  9m ISOVUE-300 IOPAMIDOL (ISOVUE-300) INJECTION 61% - administered into the collecting system(s) FLUOROSCOPY TIME:  Fluoroscopy Time: 1 minutes and 12 seconds, 25 mGy COMPLICATIONS: None immediate. PROCEDURE: The procedure was explained to the patient. The risks and benefits of the  procedure were discussed and the patient's questions were addressed. Informed consent was obtained from the patient. Patient was placed prone and right flank was prepped and draped in a sterile fashion. Maximal barrier sterile technique was utilized including caps, mask, sterile gowns, sterile gloves, sterile drape, hand hygiene and skin antiseptic. Contrast was injected through the nephrostomy tube. The nephrostomy tube was cut. It was difficult to advance an Amplatz wire through the catheter. A stiff glide wire easily advanced through the catheter. The catheter was removed over wire. A new 10.2 FP[place]urpose drain was advanced over the wire and reconstituted in the renal pelvis. Contrast injection confirmed placement in the renal pelvis. Skin was anesthetized with 1% lidocaine. Catheter was sutured to the skin. Fluoroscopic and ultrasound images were taken and saved for documentation. FINDINGS: Nephrostomy tube in the right renal pelvis. IMPRESSION: Successful exchange of the right percutaneous nephrostomy tube with fluoroscopic guidance. Electronically Signed   By: AMarkus DaftM.D.   On: 10/05/2017 13:15    ASSESSMENT: Squamous cell carcinoma of the cervix, PDL-1 13%.  PLAN:    1.  Squamous cell carcinoma of the cervix: Patient completed concurrent chemotherapy and XRT in April 2018. Brachytherapy at [facility] has also been completed. CT scan results from September 10, 2017 reviewed independently and reported as above with a stable ill-defined cervical mass and no new or progressive disease identified.  Although patient had PET scan at [facility] on July 25, 2017 that was possibly suggestive of residual malignancy. No intervention or treatment planned at this time, but patient has a repeat PET scan at [facility] in November 2018. Return to clinic in 1 month for further evaluation.  2. Pain: continue fentanyl 50 g every 3 days with oxycodone as needed. 3. Nausea: Resolved. Continue  Compazine and Phenergan suppositories as needed.  4. Constipation: Continue MiraLAX as needed. 5. Anxiety: Continue Xanax as needed. 6. Renal insufficiency: Right nephrostomy tube in place. Patient reports she has a tube replacement scheduled in the next 1-2 weeks. 7. Hypertension: Patient was given a refill for her amlodipine today. She was also given a referral to primary care for further management and evaluation.   Patient expressed understanding and was in agreement with this plan. She also understands that She can call clinic at any time with any questions, concerns, or complaints.   Cancer Staging Malignant neoplasm of cervix [facility]) Staging form: Corpus Uteri - Carcinoma and Carcinosarcoma, AJCC 8th Edition - Clinical stage from 02/18/2017: FIGO Stage IIIC1 (cT3b, cN1, cM0) - Signed by Lloyd Huger, MD on 02/18/2017   Lloyd Huger, MD   10/27/2017 8:22 AM

## 2017-10-29 ENCOUNTER — Inpatient Hospital Stay: Payer: Medicaid Other

## 2017-10-29 ENCOUNTER — Other Ambulatory Visit: Payer: Self-pay | Admitting: *Deleted

## 2017-10-29 ENCOUNTER — Inpatient Hospital Stay: Payer: Medicaid Other | Admitting: Oncology

## 2017-10-29 MED ORDER — HYDROCODONE-ACETAMINOPHEN 5-325 MG PO TABS: 1.0000 | ORAL_TABLET | ORAL | 0 refills | Status: DC | PRN

## 2017-10-29 MED ORDER — FENTANYL 50 MCG/HR TD PT72: 50.0000 ug | MEDICATED_PATCH | TRANSDERMAL | 0 refills | Status: DC

## 2017-10-29 MED ORDER — PROMETHAZINE HCL 25 MG PO TABS: 25.0000 mg | ORAL_TABLET | Freq: Four times a day (QID) | ORAL | 1 refills | Status: DC | PRN

## 2017-11-09 ENCOUNTER — Other Ambulatory Visit: Payer: Self-pay | Admitting: Oncology

## 2017-11-14 NOTE — Progress Notes (Signed)
[place]  Telephone:([phone]) [phone] Fax:(336) [phone]  ID: Patricia Rojas OB: [birth year]  MR#: [id number]  BZJ#:[id number]  Patient Care Team: [name], MD as PCP - General (Infectious Diseases) Clent Jacks, RN as Registered Nurse  CHIEF COMPLAINT: Squamous cell carcinoma of the cervix.  INTERVAL HISTORY: Patient returns to clinic today for further evaluation, laboratory work, in hospital follow-up.  Her pain has been slightly worse the past several weeks.  She was supposed to have a PET scan at [facility], but this has been canceled multiple times secondary to insurance purposes.  She continues to have good days and bad days. She continues to have a poor appetite with increased nausea. She has no neurologic complaints. She denies any recent fevers or illnesses. She has no chest pain or shortness of breath. She denies any constipation or diarrhea. She has no urinary complaints. Patient offers no further specific complaints today.  REVIEW OF SYSTEMS:   Review of Systems  Constitutional: Positive for malaise/fatigue. Negative for fever and weight loss.  Respiratory: Negative.  Negative for cough and shortness of breath.   Cardiovascular: Negative.  Negative for chest pain and leg swelling.  Gastrointestinal: Positive for nausea. Negative for abdominal pain, constipation and diarrhea.  Genitourinary: Negative.  Negative for flank pain.  Skin: Negative.  Negative for rash.  Neurological: Positive for weakness.  Psychiatric/Behavioral: Positive for depression. The patient is nervous/anxious.     As per HPI. Otherwise, a complete review of systems is negative.  PAST MEDICAL HISTORY: Past Medical History:  Diagnosis Date  . Cancer (HCC)    cervical  . Constipation   . Hemorrhoids   . N&V (nausea and vomiting)   . PONV (postoperative nausea and vomiting)     PAST SURGICAL HISTORY: Past Surgical History:  Procedure Laterality Date  . CESAREAN  SECTION  1986  . CHOLECYSTECTOMY    . HERNIA REPAIR    . IR GENERIC HISTORICAL  02/23/2017   IR NEPHROSTOMY PLACEMENT RIGHT 02/23/2017 [name], MD [facility]-INTERV RAD  . IR NEPHROSTOMY EXCHANGE RIGHT  04/18/2017  . IR NEPHROSTOMY EXCHANGE RIGHT  06/08/2017  . IR NEPHROSTOMY EXCHANGE RIGHT  08/17/2017  . IR NEPHROSTOMY EXCHANGE RIGHT  10/05/2017    FAMILY HISTORY: Family History  Problem Relation Age of Onset  . Cancer Maternal Uncle   . Cancer Paternal Aunt   . Kidney cancer Neg Hx   . Bladder Cancer Neg Hx     ADVANCED DIRECTIVES (Y/N):  N  HEALTH MAINTENANCE: Social History   Tobacco Use  . Smoking status: Former Smoker    Types: Cigarettes    Last attempt to quit: 1990    Years since quitting: 28.9  . Smokeless tobacco: Never Used  Substance Use Topics  . Alcohol use: No  . Drug use: No    Comment: stopped 3 weeks ago     Colonoscopy:  PAP:  Bone density:  Lipid panel:  No Known Allergies  Current Outpatient Medications  Medication Sig Dispense Refill  . ALPRAZolam (XANAX) 0.25 MG tablet Take 1-2 tabs daily as needed 60 tablet 0  . amLODipine (NORVASC) 2.5 MG tablet Take 1 tablet (2.5 mg total) by mouth daily. 30 tablet 0  . fentaNYL (DURAGESIC - DOSED MCG/HR) 50 MCG/HR Place 1 patch (50 mcg total) every 3 (three) days onto the skin. 10 patch 0  . HYDROcodone-acetaminophen (NORCO/VICODIN) 5-325 MG tablet Take 1-2 tablets by mouth every 4 (four) hours as needed for moderate pain. [place]  tablet 0  . lactulose (CHRONULAC) 10 GM/15ML solution Take 45 mLs (30 g total) by mouth 2 (two) times daily as needed for mild constipation. 240 mL 0  . ondansetron (ZOFRAN) 8 MG tablet TAKE ONE TABLET BY MOUTH TWICE DAILY AS NEEDED 60 tablet 2  . prochlorperazine (COMPAZINE) 10 MG tablet Take 1 tablet (10 mg total) by mouth every 6 (six) hours as needed for nausea or vomiting. 30 tablet 1  . promethazine (PHENERGAN) 25 MG tablet Take 1 tablet (25 mg total) by mouth every 6 (six) hours  as needed for nausea or vomiting. 30 tablet 1  . fentaNYL (DURAGESIC - DOSED MCG/HR) 25 MCG/HR patch Place 1 patch (25 mcg total) onto the skin every 3 (three) days. 10 patch 0   No current facility-administered medications for this visit.     OBJECTIVE: Vitals:   11/15/17 1456  BP: (!) 128/94  Pulse: 76  Resp: 20  Temp: (!) 97.5 F (36.4 C)     Body mass index is 37.89 kg/m.    ECOG FS:1 - Symptomatic but completely ambulatory  General: Well-developed, well-nourished, no acute distress. Eyes: Pink conjunctiva, anicteric sclera. Lungs: Clear to auscultation bilaterally. Heart: Regular rate and rhythm. No rubs, murmurs, or gallops. Abdomen: Soft, nontender, nondistended. No organomegaly noted, normoactive bowel sounds. GU: Exam recently performed at [facility]. Musculoskeletal: No edema, cyanosis, or clubbing. Neuro: Alert, answering all questions appropriately. Cranial nerves grossly intact. Skin: No rashes or petechiae noted. Psych: Normal affect.   LAB RESULTS:  Lab Results  Component Value Date   NA 136 11/15/2017   K 4.1 11/15/2017   CL 105 11/15/2017   CO2 22 11/15/2017   GLUCOSE 112 (H) 11/15/2017   BUN 28 (H) 11/15/2017   CREATININE 2.04 (H) 11/15/2017   CALCIUM 9.4 11/15/2017   PROT 9.2 (H) 09/10/2017   ALBUMIN 3.8 09/10/2017   AST 17 09/10/2017   ALT 12 (L) 09/10/2017   ALKPHOS 92 09/10/2017   BILITOT 0.5 09/10/2017   GFRNONAA 27 (L) 11/15/2017   GFRAA 31 (L) 11/15/2017    Lab Results  Component Value Date   WBC 7.0 11/15/2017   NEUTROABS 5.3 11/15/2017   HGB 9.2 (L) 11/15/2017   HCT 28.0 (L) 11/15/2017   MCV 78.4 (L) 11/15/2017   PLT 377 11/15/2017     STUDIES: No results found.  ASSESSMENT: Squamous cell carcinoma of the cervix, PDL-1 13%.  PLAN:    1. Squamous cell carcinoma of the cervix: Patient completed concurrent chemotherapy and XRT in April 2018. Brachytherapy at [facility] has also been completed. CT scan results from  September 10, 2017 reviewed independently with a stable ill-defined cervical mass and no new or progressive disease identified.  Although patient had PET scan at [facility] on July 25, 2017 that was possibly suggestive of residual malignancy.  Repeat PET scan has been denied on multiple occasions, therefore if patient does not have any imaging in the next 1-2 weeks she has been instructed to call clinic and we will order a CT scan. Return to clinic in 1 month for further evaluation.  2. Pain: Increase fentanyl to 75 g every 3 days with oxycodone as needed. 3. Nausea: Continue Compazine and Phenergan suppositories as needed.  4. Constipation: Continue MiraLAX as needed. 5. Anxiety: Continue Xanax as needed. 6. Renal insufficiency: Right nephrostomy tube in place.  Continue to monitor creatinine closely.   7. Hypertension: Continue management per primary care.   Patient expressed understanding and was in  agreement with this plan. She also understands that She can call clinic at any time with any questions, concerns, or complaints.   Cancer Staging Malignant neoplasm of cervix [facility]) Staging form: Corpus Uteri - Carcinoma and Carcinosarcoma, AJCC 8th Edition - Clinical stage from 02/18/2017: FIGO Stage IIIC1 (cT3b, cN1, cM0) - Signed by Lloyd Huger, MD on 02/18/2017   Lloyd Huger, MD   11/18/2017 8:50 AM

## 2017-11-15 ENCOUNTER — Inpatient Hospital Stay: Payer: Medicaid Other

## 2017-11-15 ENCOUNTER — Inpatient Hospital Stay: Payer: Medicaid Other | Attending: Oncology | Admitting: Oncology

## 2017-11-15 ENCOUNTER — Other Ambulatory Visit: Payer: Self-pay

## 2017-11-15 ENCOUNTER — Encounter: Payer: Self-pay | Admitting: Oncology

## 2017-11-15 VITALS — BP 128/94 | HR 76 | Temp 97.5°F | Resp 20 | Wt 227.7 lb

## 2017-11-15 DIAGNOSIS — C55 Malignant neoplasm of uterus, part unspecified: Secondary | ICD-10-CM

## 2017-11-15 DIAGNOSIS — I10 Essential (primary) hypertension: Secondary | ICD-10-CM | POA: Insufficient documentation

## 2017-11-15 DIAGNOSIS — C538 Malignant neoplasm of overlapping sites of cervix uteri: Secondary | ICD-10-CM

## 2017-11-15 DIAGNOSIS — Z87891 Personal history of nicotine dependence: Secondary | ICD-10-CM | POA: Diagnosis not present

## 2017-11-15 DIAGNOSIS — K5900 Constipation, unspecified: Secondary | ICD-10-CM | POA: Diagnosis not present

## 2017-11-15 DIAGNOSIS — N2889 Other specified disorders of kidney and ureter: Secondary | ICD-10-CM | POA: Insufficient documentation

## 2017-11-15 DIAGNOSIS — C539 Malignant neoplasm of cervix uteri, unspecified: Secondary | ICD-10-CM | POA: Diagnosis not present

## 2017-11-15 DIAGNOSIS — F419 Anxiety disorder, unspecified: Secondary | ICD-10-CM | POA: Diagnosis not present

## 2017-11-15 DIAGNOSIS — Z809 Family history of malignant neoplasm, unspecified: Secondary | ICD-10-CM | POA: Diagnosis not present

## 2017-11-15 DIAGNOSIS — R110 Nausea: Secondary | ICD-10-CM | POA: Insufficient documentation

## 2017-11-15 DIAGNOSIS — Z79899 Other long term (current) drug therapy: Secondary | ICD-10-CM | POA: Insufficient documentation

## 2017-11-15 DIAGNOSIS — R630 Anorexia: Secondary | ICD-10-CM | POA: Diagnosis not present

## 2017-11-15 LAB — BASIC METABOLIC PANEL
ANION GAP: 9 (ref 5–15)
BUN: 28 mg/dL — ABNORMAL HIGH (ref 6–20)
CHLORIDE: 105 mmol/L (ref 101–111)
CO2: 22 mmol/L (ref 22–32)
CREATININE: 2.04 mg/dL — AB (ref 0.44–1.00)
Calcium: 9.4 mg/dL (ref 8.9–10.3)
GFR calc non Af Amer: 27 mL/min — ABNORMAL LOW (ref >60)
GFR, EST AFRICAN AMERICAN: 31 mL/min — AB (ref >60)
Glucose, Bld: 112 mg/dL — ABNORMAL HIGH (ref 65–99)
Potassium: 4.1 mmol/L (ref 3.5–5.1)
Sodium: 136 mmol/L (ref 135–145)

## 2017-11-15 LAB — CBC WITH DIFFERENTIAL/PLATELET
Basophils Absolute: 0.1 10*3/uL (ref 0–0.1)
Basophils Relative: 1 %
Eosinophils Absolute: 0.3 10*3/uL (ref 0–0.7)
Eosinophils Relative: 5 %
HEMATOCRIT: 28 % — AB (ref 35.0–47.0)
HEMOGLOBIN: 9.2 g/dL — AB (ref 12.0–16.0)
LYMPHS ABS: 0.9 10*3/uL — AB (ref 1.0–3.6)
Lymphocytes Relative: 12 %
MCH: 25.9 pg — AB (ref 26.0–34.0)
MCHC: 33 g/dL (ref 32.0–36.0)
MCV: 78.4 fL — ABNORMAL LOW (ref 80.0–100.0)
MONOS PCT: 6 %
Monocytes Absolute: 0.4 10*3/uL (ref 0.2–0.9)
NEUTROS ABS: 5.3 10*3/uL (ref 1.4–6.5)
NEUTROS PCT: 76 %
Platelets: 377 10*3/uL (ref 150–440)
RBC: 3.57 MIL/uL — ABNORMAL LOW (ref 3.80–5.20)
RDW: 16 % — ABNORMAL HIGH (ref 11.5–14.5)
WBC: 7 10*3/uL (ref 3.6–11.0)

## 2017-11-15 LAB — MAGNESIUM: Magnesium: 1.9 mg/dL (ref 1.7–2.4)

## 2017-11-15 MED ORDER — PROMETHAZINE HCL 25 MG PO TABS: 25.0000 mg | ORAL_TABLET | Freq: Four times a day (QID) | ORAL | 1 refills | Status: DC | PRN

## 2017-11-15 MED ORDER — FENTANYL 25 MCG/HR TD PT72: 25.0000 ug | MEDICATED_PATCH | TRANSDERMAL | 0 refills | Status: DC

## 2017-11-15 MED ORDER — HYDROCODONE-ACETAMINOPHEN 5-325 MG PO TABS: 1.0000 | ORAL_TABLET | ORAL | 0 refills | Status: DC | PRN

## 2017-11-15 NOTE — Progress Notes (Signed)
Nutrition Assessment   Reason for Assessment:   Patient identified on Malnutrition Screening report for weight loss and poor appetite  ASSESSMENT:  51 year old female with squamous cell carcinoma of cervix.  Patient has completed chemotherapy and radiation therapy in April 2018.  Brachytherapy at [facility] also was completed.  Per patient waiting for PET approval.  Past medical history of renal insufficiency with right nephrostomy tube, constipation, HTN  Met with patient today in clinic.  Patient tearful during much of visit.  Reports that she wants to eat and feel better but she just can't.  Does not have an appetite.  Reports issues with nausea and constipation.  Reports she has only drank 2 sprite today.  Yesterday ate fairly good.  Asked what she ate and reports snacked on foods.  Probed several different ways to ask what patient is eating on "good" days but unable to provide examples.  Does not like supplement drinks.    Nutrition Focused Physical Exam: deferred  Medications: hydrocodone-acetaminophen, zofran, compazine, phenergan  Labs: glucose 112, BUN 28, creatinine 2.04  Anthropometrics:   Height: 65 inches Weight: 227 lb 11.2 oz Noted 254 lb 9/5 BMI: 37  11% weight loss in the last 3 months, significant   Estimated Energy Needs  Kcals: 2200-2500 calories/d Protein: 85-114 g/d (1.5-2.0 g/kg IBW) Fluid: > 2.2 L/d  NUTRITION DIAGNOSIS: Malnutrition related to  altered GI function (nausea, vomiting, constipation) as evidenced by 11% weight loss in the last 3 months and eating less than 75% of energy needs for > or equal to 1 month   MALNUTRITION DIAGNOSIS: Patient meets criteria for severe malnutrition in context of chronic illness as evidenced by 11% weight loss in the last 3 months and eating < 75% of energy needs for > or equal to 1 month   INTERVENTION:   Offered counseling services available at cancer center. Patient declined at this time Discussed strategies to  avoid constipation. Fact sheet given. Also discussed bowel regimen with RN, Tillie Rung and [name] to follow-up with patient at visit today. Encouraged small frequent meals. Discussed ways to increase calories and protein. Fact sheet given.   Patient may benefit from appetite stimulant if intake does not improve.      MONITORING, EVALUATION, GOAL: weight trends, intake   NEXT VISIT: Jan 7 following MD visit  Roger Kettles B. Zenia Resides, [place], [place] Registered Dietitian [phone] (pager)

## 2017-11-15 NOTE — Progress Notes (Signed)
Patient denies any concerns today.  

## 2017-11-27 ENCOUNTER — Telehealth: Payer: Self-pay | Admitting: Urology

## 2017-11-27 NOTE — Telephone Encounter (Signed)
Dr Erlene Quan wanted to see her before we schedule the tube exchange.

## 2017-11-27 NOTE — Telephone Encounter (Signed)
Patient had to reschd her Pet scan so I had to reschd her follow up app She said it is time to have her nep. Tubes changed, wanted to know when she would be able to get that set up?   Sharyn Lull

## 2017-11-28 ENCOUNTER — Ambulatory Visit: Payer: Self-pay | Admitting: Urology

## 2017-11-30 ENCOUNTER — Other Ambulatory Visit: Payer: Self-pay | Admitting: *Deleted

## 2017-11-30 MED ORDER — HYDROCODONE-ACETAMINOPHEN 5-325 MG PO TABS: 1.0000 | ORAL_TABLET | ORAL | 0 refills | Status: DC | PRN

## 2017-11-30 MED ORDER — FENTANYL 75 MCG/HR TD PT72: 75.0000 ug | MEDICATED_PATCH | TRANSDERMAL | 0 refills | Status: DC

## 2017-12-16 NOTE — Progress Notes (Signed)
[place]  [phone] [phone]  ID: Patricia Rojas OB: 13-Jan-1966  MR#: [id number]  QDU#:[id number]  Patient Care Team: [name], MD as PCP - General (Infectious Diseases) Clent Jacks, RN as Registered Nurse  CHIEF COMPLAINT: Squamous cell carcinoma of the cervix.  INTERVAL HISTORY: Patient returns to clinic today for further evaluation and discussion of her PET scan results.  Her pain is better controlled and she has discontinued her fentanyl patch. She continues to have good days and bad days. She continues to have a poor appetite, but does not complain of much nausea today. She has no neurologic complaints. She denies any recent fevers or illnesses. She has no chest pain or shortness of breath. She denies any constipation or diarrhea. She has no urinary complaints. Patient offers no further specific complaints today.  REVIEW OF SYSTEMS:   Review of Systems  Constitutional: Positive for malaise/fatigue. Negative for fever and weight loss.  Respiratory: Negative.  Negative for cough and shortness of breath.   Cardiovascular: Negative.  Negative for chest pain and leg swelling.  Gastrointestinal: Positive for nausea. Negative for abdominal pain, constipation and diarrhea.  Genitourinary: Negative.  Negative for flank pain.  Skin: Negative.  Negative for rash.  Neurological: Positive for weakness.  Psychiatric/Behavioral: Positive for depression. The patient is nervous/anxious.     As per HPI. Otherwise, a complete review of systems is negative.  PAST MEDICAL HISTORY: Past Medical History:  Diagnosis Date  . Cancer (HCC)    cervical  . Constipation   . Hemorrhoids   . N&V (nausea and vomiting)   . PONV (postoperative nausea and vomiting)     PAST SURGICAL HISTORY: Past Surgical History:  Procedure Laterality Date  . CESAREAN SECTION  1986  . CHOLECYSTECTOMY    . HERNIA REPAIR    . IR GENERIC HISTORICAL  02/23/2017   IR NEPHROSTOMY PLACEMENT RIGHT 02/23/2017 [name], MD [facility]INTERV RAD  . IR NEPHROSTOMY EXCHANGE RIGHT  04/18/2017  . IR NEPHROSTOMY EXCHANGE RIGHT  06/08/2017  . IR NEPHROSTOMY EXCHANGE RIGHT  08/17/2017  . IR NEPHROSTOMY EXCHANGE RIGHT  10/05/2017    FAMILY HISTORY: Family History  Problem Relation Age of Onset  . Cancer Maternal Uncle   . Cancer Paternal Aunt   . Kidney cancer Neg Hx   . Bladder Cancer Neg Hx     ADVANCED DIRECTIVES (Y/N):  N  HEALTH MAINTENANCE: Social History   Tobacco Use  . Smoking status: Former Smoker    Types: Cigarettes    Last attempt to quit: 1990    Years since quitting: 29.0  . Smokeless tobacco: Never Used  Substance Use Topics  . Alcohol use: No  . Drug use: No    Comment: stopped 3 weeks ago     Colonoscopy:  PAP:  Bone density:  Lipid panel:  No Known Allergies  Current Outpatient Medications  Medication Sig Dispense Refill  . ALPRAZolam (XANAX) 0.25 MG tablet Take 1-2 tabs daily as needed 60 tablet 0  . amLODipine (NORVASC) 2.5 MG tablet Take 1 tablet (2.5 mg total) by mouth daily. 30 tablet 0  . HYDROcodone-acetaminophen (NORCO/VICODIN) 5-325 MG tablet Take 1-2 tablets by mouth every 4 (four) hours as needed for moderate pain. 90 tablet 0  . lactulose (CHRONULAC) 10 GM/15ML solution Take 45 mLs (30 g total) by mouth 2 (two) times daily as needed for mild constipation. 240 mL 0  . ondansetron (ZOFRAN) 8 MG tablet TAKE ONE TABLET BY MOUTH  TWICE DAILY AS NEEDED 60 tablet 2  . prochlorperazine (COMPAZINE) 10 MG tablet Take 1 tablet (10 mg total) by mouth every 6 (six) hours as needed for nausea or vomiting. 30 tablet 1  . promethazine (PHENERGAN) 25 MG tablet Take 1 tablet (25 mg total) by mouth every 6 (six) hours as needed for nausea or vomiting. 30 tablet 1   No current facility-administered medications for this visit.     OBJECTIVE: Vitals:   12/17/17 1410  BP: 116/84  Pulse: 67  Resp: 18  Temp: 97.8 F (36.6 C)      Body mass index is 38.07 kg/m.    ECOG FS:1 - Symptomatic but completely ambulatory  General: Well-developed, well-nourished, no acute distress. Eyes: Pink conjunctiva, anicteric sclera. Lungs: Clear to auscultation bilaterally. Heart: Regular rate and rhythm. No rubs, murmurs, or gallops. Abdomen: Soft, nontender, nondistended. No organomegaly noted, normoactive bowel sounds. GU: Exam recently performed at [facility]. Musculoskeletal: No edema, cyanosis, or clubbing. Neuro: Alert, answering all questions appropriately. Cranial nerves grossly intact. Skin: No rashes or petechiae noted. Psych: Normal affect.   LAB RESULTS:  Lab Results  Component Value Date   NA 136 12/17/2017   K 3.8 12/17/2017   CL 103 12/17/2017   CO2 25 12/17/2017   GLUCOSE 91 12/17/2017   BUN 20 12/17/2017   CREATININE 1.59 (H) 12/17/2017   CALCIUM 8.6 (L) 12/17/2017   PROT 9.2 (H) 09/10/2017   ALBUMIN 3.8 09/10/2017   AST 17 09/10/2017   ALT 12 (L) 09/10/2017   ALKPHOS 92 09/10/2017   BILITOT 0.5 09/10/2017   GFRNONAA 37 (L) 12/17/2017   GFRAA 42 (L) 12/17/2017    Lab Results  Component Value Date   WBC 7.9 12/17/2017   NEUTROABS 5.9 12/17/2017   HGB 9.7 (L) 12/17/2017   HCT 29.7 (L) 12/17/2017   MCV 83.1 12/17/2017   PLT 342 12/17/2017     STUDIES: No results found.  ASSESSMENT: Squamous cell carcinoma of the cervix, PDL-1 13%.  PLAN:    1. Squamous cell carcinoma of the cervix: Patient completed concurrent chemotherapy and XRT in April 2018. Brachytherapy at [facility] has also been completed.  PET scan results from [facility] on November 29, 2017 reviewed independently with concern of local recurrence of disease.  Have referred patient to gynecology oncology for further evaluation and consideration of biopsy of lesion.  If biopsy positive, patient possibly could proceed with surgery.  She will also likely require chemotherapy whether or not she has surgery.  Return to  clinic in approximately 1 week to discuss her biopsy results and treatment planning if necessary.   2. Pain: Patient has discontinued her fentanyl patch.  Continue oxycodone as needed.   3. Nausea: Continue Compazine and Phenergan suppositories as needed.  4. Constipation: Continue MiraLAX as needed. 5. Anxiety: Continue Xanax as needed. 6. Renal insufficiency: Right nephrostomy tube in place.  Continue to monitor creatinine closely.  Patient has an appointment with urology in the near future. 7. Hypertension: Continue management per primary care.   Patient expressed understanding and was in agreement with this plan. She also understands that She can call clinic at any time with any questions, concerns, or complaints.   Cancer Staging Malignant neoplasm of cervix [facility]) Staging form: Corpus Uteri - Carcinoma and Carcinosarcoma, AJCC 8th Edition - Clinical stage from 02/18/2017: FIGO Stage IIIC1 (cT3b, cN1, cM0) - Signed by Lloyd Huger, MD on 02/18/2017   Lloyd Huger, MD   12/21/2017 3:29  PM

## 2017-12-17 ENCOUNTER — Encounter: Payer: Self-pay | Admitting: Oncology

## 2017-12-17 ENCOUNTER — Inpatient Hospital Stay (HOSPITAL_BASED_OUTPATIENT_CLINIC_OR_DEPARTMENT_OTHER): Payer: Medicaid Other | Admitting: Oncology

## 2017-12-17 ENCOUNTER — Inpatient Hospital Stay: Payer: Medicaid Other

## 2017-12-17 ENCOUNTER — Inpatient Hospital Stay: Payer: Medicaid Other | Attending: Oncology

## 2017-12-17 ENCOUNTER — Other Ambulatory Visit: Payer: Self-pay

## 2017-12-17 VITALS — BP 116/84 | HR 67 | Temp 97.8°F | Resp 18 | Wt 228.8 lb

## 2017-12-17 DIAGNOSIS — Z9221 Personal history of antineoplastic chemotherapy: Secondary | ICD-10-CM | POA: Diagnosis not present

## 2017-12-17 DIAGNOSIS — C538 Malignant neoplasm of overlapping sites of cervix uteri: Secondary | ICD-10-CM

## 2017-12-17 DIAGNOSIS — I10 Essential (primary) hypertension: Secondary | ICD-10-CM | POA: Diagnosis not present

## 2017-12-17 DIAGNOSIS — R52 Pain, unspecified: Secondary | ICD-10-CM

## 2017-12-17 DIAGNOSIS — N289 Disorder of kidney and ureter, unspecified: Secondary | ICD-10-CM

## 2017-12-17 DIAGNOSIS — Z936 Other artificial openings of urinary tract status: Secondary | ICD-10-CM

## 2017-12-17 DIAGNOSIS — C539 Malignant neoplasm of cervix uteri, unspecified: Secondary | ICD-10-CM | POA: Diagnosis not present

## 2017-12-17 DIAGNOSIS — K5900 Constipation, unspecified: Secondary | ICD-10-CM | POA: Diagnosis not present

## 2017-12-17 DIAGNOSIS — Z923 Personal history of irradiation: Secondary | ICD-10-CM | POA: Insufficient documentation

## 2017-12-17 DIAGNOSIS — R110 Nausea: Secondary | ICD-10-CM | POA: Insufficient documentation

## 2017-12-17 DIAGNOSIS — Z79899 Other long term (current) drug therapy: Secondary | ICD-10-CM | POA: Diagnosis not present

## 2017-12-17 DIAGNOSIS — F419 Anxiety disorder, unspecified: Secondary | ICD-10-CM | POA: Insufficient documentation

## 2017-12-17 DIAGNOSIS — R630 Anorexia: Secondary | ICD-10-CM

## 2017-12-17 DIAGNOSIS — Z87891 Personal history of nicotine dependence: Secondary | ICD-10-CM | POA: Diagnosis not present

## 2017-12-17 DIAGNOSIS — C55 Malignant neoplasm of uterus, part unspecified: Secondary | ICD-10-CM

## 2017-12-17 LAB — CBC WITH DIFFERENTIAL/PLATELET
BASOS ABS: 0.1 10*3/uL (ref 0–0.1)
BASOS PCT: 1 %
EOS PCT: 4 %
Eosinophils Absolute: 0.3 10*3/uL (ref 0–0.7)
HCT: 29.7 % — ABNORMAL LOW (ref 35.0–47.0)
Hemoglobin: 9.7 g/dL — ABNORMAL LOW (ref 12.0–16.0)
Lymphocytes Relative: 15 %
Lymphs Abs: 1.1 10*3/uL (ref 1.0–3.6)
MCH: 27.1 pg (ref 26.0–34.0)
MCHC: 32.6 g/dL (ref 32.0–36.0)
MCV: 83.1 fL (ref 80.0–100.0)
MONO ABS: 0.5 10*3/uL (ref 0.2–0.9)
MONOS PCT: 6 %
Neutro Abs: 5.9 10*3/uL (ref 1.4–6.5)
Neutrophils Relative %: 74 %
PLATELETS: 342 10*3/uL (ref 150–440)
RBC: 3.58 MIL/uL — ABNORMAL LOW (ref 3.80–5.20)
RDW: 19.1 % — AB (ref 11.5–14.5)
WBC: 7.9 10*3/uL (ref 3.6–11.0)

## 2017-12-17 LAB — BASIC METABOLIC PANEL
Anion gap: 8 (ref 5–15)
BUN: 20 mg/dL (ref 6–20)
CALCIUM: 8.6 mg/dL — AB (ref 8.9–10.3)
CO2: 25 mmol/L (ref 22–32)
Chloride: 103 mmol/L (ref 101–111)
Creatinine, Ser: 1.59 mg/dL — ABNORMAL HIGH (ref 0.44–1.00)
GFR calc Af Amer: 42 mL/min — ABNORMAL LOW (ref >60)
GFR, EST NON AFRICAN AMERICAN: 37 mL/min — AB (ref >60)
GLUCOSE: 91 mg/dL (ref 65–99)
Potassium: 3.8 mmol/L (ref 3.5–5.1)
Sodium: 136 mmol/L (ref 135–145)

## 2017-12-17 LAB — MAGNESIUM: Magnesium: 1.8 mg/dL (ref 1.7–2.4)

## 2017-12-17 MED ORDER — PROMETHAZINE HCL 25 MG PO TABS: 25.0000 mg | ORAL_TABLET | Freq: Four times a day (QID) | ORAL | 1 refills | Status: DC | PRN

## 2017-12-17 MED ORDER — HYDROCODONE-ACETAMINOPHEN 5-325 MG PO TABS: 1.0000 | ORAL_TABLET | ORAL | 0 refills | Status: DC | PRN

## 2017-12-17 NOTE — Progress Notes (Signed)
Appointment arranged with Ms. Spoelstra to see Dr. Fransisca Connors 1/9 at 0900 for possible biopsy and further treatment planning. Oncology Nurse Navigator Documentation  Navigator Location: [facility] Onc (12/17/17 1500)   )Navigator Encounter Type: Clinic/MDC (12/17/17 1500) Telephone: Clinic/MDC Follow-up (12/17/17 1500)                                                  Time Spent with Patient: 15 (12/17/17 1500)

## 2017-12-17 NOTE — Progress Notes (Signed)
Nutrition  Patient declined nutrition follow-up today.  Reports that she is doing better and eating better.  Has gained some weight (noted 228 lb 12.8 oz today) increased from 227 lb 11.2 oz.  Patient knows to contact me with further questions or concerns.    Ayron Fillinger B. Zenia Resides, [place], [place] Registered Dietitian [phone] (pager)

## 2017-12-17 NOTE — Progress Notes (Signed)
Patient recently seen at [facility], had PET scan.

## 2017-12-18 ENCOUNTER — Ambulatory Visit: Payer: Self-pay | Admitting: Urology

## 2017-12-19 ENCOUNTER — Ambulatory Visit (INDEPENDENT_AMBULATORY_CARE_PROVIDER_SITE_OTHER): Payer: Medicaid Other | Admitting: Urology

## 2017-12-19 ENCOUNTER — Inpatient Hospital Stay (HOSPITAL_BASED_OUTPATIENT_CLINIC_OR_DEPARTMENT_OTHER): Payer: Medicaid Other | Admitting: Obstetrics and Gynecology

## 2017-12-19 ENCOUNTER — Other Ambulatory Visit: Payer: Self-pay | Admitting: Radiology

## 2017-12-19 ENCOUNTER — Other Ambulatory Visit: Payer: Self-pay | Admitting: Obstetrics and Gynecology

## 2017-12-19 ENCOUNTER — Encounter: Payer: Self-pay | Admitting: Urology

## 2017-12-19 VITALS — BP 112/79 | HR 79 | Ht 64.0 in | Wt 227.0 lb

## 2017-12-19 VITALS — BP 118/83 | HR 87 | Temp 98.0°F | Resp 18 | Ht 65.0 in | Wt 227.1 lb

## 2017-12-19 DIAGNOSIS — C539 Malignant neoplasm of cervix uteri, unspecified: Secondary | ICD-10-CM | POA: Diagnosis not present

## 2017-12-19 DIAGNOSIS — Z87891 Personal history of nicotine dependence: Secondary | ICD-10-CM

## 2017-12-19 DIAGNOSIS — Z79899 Other long term (current) drug therapy: Secondary | ICD-10-CM

## 2017-12-19 DIAGNOSIS — N1330 Unspecified hydronephrosis: Secondary | ICD-10-CM | POA: Diagnosis not present

## 2017-12-19 DIAGNOSIS — C538 Malignant neoplasm of overlapping sites of cervix uteri: Secondary | ICD-10-CM | POA: Diagnosis not present

## 2017-12-19 DIAGNOSIS — Z9221 Personal history of antineoplastic chemotherapy: Secondary | ICD-10-CM

## 2017-12-19 DIAGNOSIS — Z923 Personal history of irradiation: Secondary | ICD-10-CM | POA: Diagnosis not present

## 2017-12-19 NOTE — Progress Notes (Signed)
12/19/2017 1:34 PM   Patricia Rojas Jun 17, 1966 [id number]  Referring provider: [name], MD [place], [place] [address]  No chief complaint on file.   HPI: 52 year old female with right hydronephrosis secondary to stage IIIB cervical cancer managed with right PCN.    She is managed Dr. Fransisca Connors, [name], and Dr. Baruch Gouty receiving ciplatin, radiation, and brachytherapy at [facility].  Most recent PET scan12/27/18 shows residual diease in pelvis.  She underwent a biopsy and consideration of further chemotherapy versus surgery were discussed.  In terms of her nephrostomy tube, she underwent attempted a clamping trial which was unsuccessful in 06/2017 . She developed leakage around the tube, severe pain requiring the tube to be unclamped. She subsequently was admitted twice for pyelonephritis.  She was briefly under the care of infectious disease and on suppressive antibiotics for a period of time which is now completed.  Her creatinine remains elevated 1.59 as of 12/17/17 from her previous baseline of 0.68 prior to her dx.  Her nephrostomy tube was last exchanged on 10/05/2017.  She had no infections or issues since her last exchange.  Of note, her bag and tubing recently turned purplish black following a recent scan.  Her urine itself is clear and yellow.  No recent fevers or chills.  No flank pain.   PMH: Past Medical History:  Diagnosis Date  . Cancer (HCC)    cervical  . Constipation   . Hemorrhoids   . N&V (nausea and vomiting)   . PONV (postoperative nausea and vomiting)     Surgical History: Past Surgical History:  Procedure Laterality Date  . CESAREAN SECTION  1986  . CHOLECYSTECTOMY    . HERNIA REPAIR    . IR GENERIC HISTORICAL  02/23/2017   IR NEPHROSTOMY PLACEMENT RIGHT 02/23/2017 [name], MD [facility]-INTERV RAD  . IR NEPHROSTOMY EXCHANGE RIGHT  04/18/2017  . IR NEPHROSTOMY EXCHANGE RIGHT  06/08/2017  . IR NEPHROSTOMY EXCHANGE RIGHT   08/17/2017  . IR NEPHROSTOMY EXCHANGE RIGHT  10/05/2017    Home Medications:  Allergies as of 12/19/2017   No Known Allergies     Medication List        Accurate as of 12/19/17  1:34 PM. Always use your most recent med list.          ALPRAZolam 0.25 MG tablet Commonly known as:  XANAX Take 1-2 tabs daily as needed   amLODipine 2.5 MG tablet Commonly known as:  NORVASC Take 1 tablet (2.5 mg total) by mouth daily.   HYDROcodone-acetaminophen 5-325 MG tablet Commonly known as:  NORCO/VICODIN Take 1-2 tablets by mouth every 4 (four) hours as needed for moderate pain.   lactulose 10 GM/15ML solution Commonly known as:  CHRONULAC Take 45 mLs (30 g total) by mouth 2 (two) times daily as needed for mild constipation.   ondansetron 8 MG tablet Commonly known as:  ZOFRAN TAKE ONE TABLET BY MOUTH TWICE DAILY AS NEEDED   prochlorperazine 10 MG tablet Commonly known as:  COMPAZINE Take 1 tablet (10 mg total) by mouth every 6 (six) hours as needed for nausea or vomiting.   promethazine 25 MG tablet Commonly known as:  PHENERGAN Take 1 tablet (25 mg total) by mouth every 6 (six) hours as needed for nausea or vomiting.       Allergies: No Known Allergies  Family History: Family History  Problem Relation Age of Onset  . Cancer Maternal Uncle   . Cancer Paternal Aunt   .  Kidney cancer Neg Hx   . Bladder Cancer Neg Hx     Social History:  reports that she quit smoking about 29 years ago. Her smoking use included cigarettes. she has never used smokeless tobacco. She reports that she does not drink alcohol or use drugs.  ROS: UROLOGY Frequent Urination?: No Hard to postpone urination?: No Burning/pain with urination?: No Get up at night to urinate?: No Leakage of urine?: No Urine stream starts and stops?: No Trouble starting stream?: No Do you have to strain to urinate?: No Blood in urine?: No Urinary tract infection?: No Sexually transmitted disease?: No Injury to  kidneys or bladder?: No Painful intercourse?: No Weak stream?: No Currently pregnant?: No Vaginal bleeding?: No Last menstrual period?: n  Gastrointestinal Nausea?: No Vomiting?: No Indigestion/heartburn?: No Diarrhea?: No Constipation?: Yes  Constitutional Fever: No Night sweats?: No Weight loss?: No Fatigue?: No  Skin Skin rash/lesions?: No Itching?: No  Eyes Blurred vision?: No Double vision?: No  Ears/Nose/Throat Sore throat?: No Sinus problems?: No  Hematologic/Lymphatic Swollen glands?: No Easy bruising?: No  Cardiovascular Leg swelling?: No Chest pain?: No  Respiratory Cough?: No Shortness of breath?: No  Endocrine Excessive thirst?: No  Musculoskeletal Back pain?: No Joint pain?: No  Neurological Headaches?: No Dizziness?: No  Psychologic Depression?: No Anxiety?: No  Physical Exam: BP 112/79   Pulse 79   Ht 5\' 4"  (1.626 m)   Wt 227 lb (103 kg)   LMP 02/28/2017 (Approximate)   BMI 38.96 kg/m   Constitutional:  Alert and oriented, No acute distress. HEENT: [place] AT, moist mucus membranes.  Trachea midline, no masses. Cardiovascular: No clubbing, cyanosis, or edema. Respiratory: Normal respiratory effort, no increased work of breathing. GI: Abdomen is soft, nontender, nondistended, no abdominal masses GU: Right nephrostomy tube clear yellow urine.  Tubing and bag stained purplish black. Skin: No rashes, bruises or suspicious lesions.. Neurologic: Grossly intact, no focal deficits, moving all 4 extremities. Psychiatric: Normal mood and affect.  Not tearful today as on previous occasions.  Laboratory Data: Lab Results  Component Value Date   WBC 7.9 12/17/2017   HGB 9.7 (L) 12/17/2017   HCT 29.7 (L) 12/17/2017   MCV 83.1 12/17/2017   PLT 342 12/17/2017    Lab Results  Component Value Date   CREATININE 1.59 (H) 12/17/2017    Urinalysis Lab Results  Component Value Date   APPEARANCEUR CLOUDY (A) 09/11/2017   LEUKOCYTESUR  SMALL (A) 09/11/2017   PROTEINUR NEGATIVE 09/11/2017   GLUCOSEU NEGATIVE 09/11/2017   RBCU 0-5 09/11/2017   BILIRUBINUR NEGATIVE 09/11/2017   NITRITE NEGATIVE 09/11/2017    Lab Results  Component Value Date   BACTERIA RARE (A) 09/11/2017    Pertinent Imaging: PET scan results from 11/2017 reviewed in care everywhere  Chart review performed of all medical oncology  Assessment & Plan:    1. Hydronephrosis, unspecified hydronephrosis type Due for right nephrostomy tube exchange preprocedure urine culture,  Will treat the day before, day of, and the day after if she goes any clonal bacteria Previously declined internalization of stent - CULTURE, URINE COMPREHENSIVE  2. Cervical cancer, FIGO stage IIIB (HCC) Appears to have evidence of persistent pelvic disease Repeat biopsy from today pending Reviewed notes from Dr. Patterson Hammersmith likely need more extensive surgical resection and/or chemo pending results   Return in about 3 months (around 03/19/2018) for discuss next tube exchange.  Hollice Espy, MD  [facility] [address], [place], [place] [address] [phone]

## 2017-12-19 NOTE — Progress Notes (Signed)
Chaperoned pelvic exam. ECC and Pap sent to pathology/lab. Follow up already arranged. Oncology Nurse Navigator Documentation  Navigator Location: [facility] Onc (12/19/17 0900)   )Navigator Encounter Type: Follow-up Appt (12/19/17 0900)                     Patient Visit Type: GynOnc (12/19/17 0900)                              Time Spent with Patient: 30 (12/19/17 0900)

## 2017-12-19 NOTE — Progress Notes (Signed)
Gynecologic Oncology Interval Visit   Referring Provider: ED  Chief Concern: Stage IIIb cervical cancer with concern for recurrence  Subjective:  Patricia Rojas is a 52 y.o. multiparous female who presents for follow up of Stage IIIb cervical cancer.  She has completed concurrent chemotherapy and external beam XRT in April 2018. Brachytherapy at [facility] was completed with HDR x 5 on 04/20/2017.  07/25/2017 PET/CT  Impression:  1. Hypermetabolic FDG uptake in the region of the cervix. Post- treatment  changes may contribute to the increased radiotracer activity, but the  degree of radioactivity is concerning for residual disease.   2. No evidence of FDG avid nodal or distant metastatic disease.  Dr. Christel Mormon saw her on 07/25/2017 and had a negative pelvic exam. He recommended repeat imaging in 3 months at [facility].  This was done two weeks ago and showed persistent decreased size of FDG activity in cervix, but increased SUV max.  She continues to have right PCN and has constant RLQ pain treated with oxycodone.  No bleeding or discharge.   Gynecologic Oncology History Patricia Rojas is a pleasant multiparous female diagnosed with stage IIIb cervical cancer who is seen in consultation from ED. Please see prior notes for details.   02/07/17 CT scan shows large cervical mass and and right hydronephrosis. 1. Near circumferential irregular masslike density involving the lower uterus or cervix highly concerning for primary neoplasm. 2. Uterine/cervical mass causes obstruction of the distal right ureter in the pelvis with moderate hydroureteronephrosis. 3. Left external 8 mm iliac node is not enlarged by size criteria, however is indeterminate. 4. Colonic diverticulosis without diverticulitis. 5. Small nodes adjacent to the distal esophagus are of uncertain etiology and significance.   02/07/17 Cervical biopsy invasive squamous cell carcinoma. She was treated with primary chemoradiation. She has completed  concurrent chemotherapy and external beam XRT in April 2018. Brachytherapy at [facility] was completed with HDR x 5 on 04/20/2017.  Required right PCN for ureteral obstruction. 08/12/2017-08/15/2017 [place] admission for urinary tract infection associated with nephrostomy catheter.   Past Medical History: Past Medical History:  Diagnosis Date  . Cancer (HCC)    cervical  . Constipation   . Hemorrhoids   . N&V (nausea and vomiting)   . PONV (postoperative nausea and vomiting)     Past Surgical History: Past Surgical History:  Procedure Laterality Date  . CESAREAN SECTION  1986  . CHOLECYSTECTOMY    . HERNIA REPAIR    . IR GENERIC HISTORICAL  02/23/2017   IR NEPHROSTOMY PLACEMENT RIGHT 02/23/2017 [name], MD [facility]INTERV RAD  . IR NEPHROSTOMY EXCHANGE RIGHT  04/18/2017  . IR NEPHROSTOMY EXCHANGE RIGHT  06/08/2017  . IR NEPHROSTOMY EXCHANGE RIGHT  08/17/2017  . IR NEPHROSTOMY EXCHANGE RIGHT  10/05/2017   Family History: Family History  Problem Relation Age of Onset  . Cancer Maternal Uncle   . Cancer Paternal Aunt   . Kidney cancer Neg Hx   . Bladder Cancer Neg Hx     Social History: Social History   Socioeconomic History  . Marital status: Single    Spouse name: Not on file  . Number of children: Not on file  . Years of education: Not on file  . Highest education level: Not on file  Social Needs  . Financial resource strain: Not on file  . Food insecurity - worry: Not on file  . Food insecurity - inability: Not on file  . Transportation needs - medical: Not on file  .  Transportation needs - non-medical: Not on file  Occupational History  . Not on file  Tobacco Use  . Smoking status: Former Smoker    Types: Cigarettes    Last attempt to quit: 1990    Years since quitting: 29.0  . Smokeless tobacco: Never Used  Substance and Sexual Activity  . Alcohol use: No  . Drug use: No    Comment: stopped 3 weeks ago  . Sexual activity: Not Currently  Other Topics  Concern  . Not on file  Social History Narrative  . Not on file    Allergies: No Known Allergies  Current Medications: Current Outpatient Medications  Medication Sig Dispense Refill  . ALPRAZolam (XANAX) 0.25 MG tablet Take 1-2 tabs daily as needed 60 tablet 0  . amLODipine (NORVASC) 2.5 MG tablet Take 1 tablet (2.5 mg total) by mouth daily. 30 tablet 0  . HYDROcodone-acetaminophen (NORCO/VICODIN) 5-325 MG tablet Take 1-2 tablets by mouth every 4 (four) hours as needed for moderate pain. 90 tablet 0  . lactulose (CHRONULAC) 10 GM/15ML solution Take 45 mLs (30 g total) by mouth 2 (two) times daily as needed for mild constipation. 240 mL 0  . ondansetron (ZOFRAN) 8 MG tablet TAKE ONE TABLET BY MOUTH TWICE DAILY AS NEEDED 60 tablet 2  . prochlorperazine (COMPAZINE) 10 MG tablet Take 1 tablet (10 mg total) by mouth every 6 (six) hours as needed for nausea or vomiting. 30 tablet 1  . promethazine (PHENERGAN) 25 MG tablet Take 1 tablet (25 mg total) by mouth every 6 (six) hours as needed for nausea or vomiting. 30 tablet 1   No current facility-administered medications for this visit.     Review of Systems  General: fatigue, decreased appetite  HEENT: no complaints  Lungs: no complaints  Cardiac: no complaints  GI: abdominal pain, nausea/vomiting/constipation  GU: no complaints  Musculoskeletal: no complaints  Extremities: no complaints  Skin: no complaints  Neuro: no complaints  Endocrine: no complaints  Psych: no complaints       Objective:  Physical Examination:    Vitals:   12/19/17 0906  BP: 118/83  Pulse: 87  Resp: 18  Temp: 98 F (36.7 C)  TempSrc: Tympanic  Weight: 227 lb 1.6 oz (103 kg)  Height: 5\' 5"  (1.651 m)    Body mass index is 37.79 kg/m.    ECOG Performance Status: 1 - Symptomatic but completely ambulatory  General appearance: alert, cooperative and appears stated age HEENT:PERRLA, neck supple with midline trachea and thyroid without  masses Lymph node survey: non-palpable inguinal Heart: regular rate and rhythm Lungs: clear to A and P Abdomen: obese, soft  Extremities: extremities normal, atraumatic, no cyanosis or edema  Neurological exam reveals alert, oriented, normal speech, no focal findings or movement disorder noted.  Pelvic: exam chaperoned by nurse;  Vulva: normal appearing vulva with no masses, tenderness or lesions; Vagina: normal vagina visually but some agglutination broken down at apex; Adnexa:tenderness on the right and no masses but limited exam due to habitus; Cervix: The cervix is firm to palpation and limited mobility, some thickening on the right posterior area, the os is open and PAP brushing and ECC done; Uterus: not palpable. Adnexa/RV: on digital exam there is firmness and nodularity on the right cervix that I may extend to the parametrium.  Lab Review Lab Results  Component Value Date   WBC 7.9 12/17/2017   HGB 9.7 (L) 12/17/2017   HCT 29.7 (L) 12/17/2017   MCV 83.1 12/17/2017  PLT 342 12/17/2017   BMP Latest Ref Rng & Units 12/17/2017 11/15/2017 10/01/2017  Glucose 65 - 99 mg/dL 91 112(H) 104(H)  BUN 6 - 20 mg/dL 20 28(H) 23(H)  Creatinine 0.44 - 1.00 mg/dL 1.59(H) 2.04(H) 2.01(H)  Sodium 135 - 145 mmol/L 136 136 138  Potassium 3.5 - 5.1 mmol/L 3.8 4.1 4.3  Chloride 101 - 111 mmol/L 103 105 101  CO2 22 - 32 mmol/L 25 22 24   Calcium 8.9 - 10.3 mg/dL 8.6(L) 9.4 9.6   12/06/17 PET image of cervix above  Procedure note: Patient signed consent for ECC.  No barriers to learning and time out performed. There was more than expected bleeding both with the pap brush and the ECC.  Small amount of blood and tissue obtained.  She tolerated the procedure well and no excessive bleeding.      Assessment:  Patricia Rojas is a 52 y.o. female diagnosed with stage IIIB cervical squamous cell cancer 2/18.  CT showed the cervical mass extending into the right parametrial with some right ureteral obstruction and  hydronephrosis s/p R PCN.  S/p primary chemoradiation and HDR brachytherapy.  Post treatment PET scans have been concerning for treatment effect versus residual disease. Symptoms and exam concerning for persistent disease. No evidence of distant disease.  Medical co-morbidities complicating care: prior chemoradiation.  Plan:   Problem List Items Addressed This Visit      Genitourinary   Malignant neoplasm of cervix ([place]) - Primary     Will follow up results of PAP and ECC today.  Since PET scan shows no evidence of metastatic disease we can explore surgical management with exenteration if biopsy today is positive.  If biopsies negative can consider EUA and true cut biopsies to get better tissue sample. Will contact her next week with results.  Seeing Dr Erlene Quan in Urology for PCN follow up today.  Still needs PCN for now.    The patient's diagnosis, an outline of the further diagnostic and laboratory studies which will be required, the recommendation, and alternatives were discussed.  All questions were answered to the patient's satisfaction.   [name], MD

## 2017-12-19 NOTE — Progress Notes (Signed)
Back pain, pelvic pain, abd. Pain and it is about the same. Not changed.

## 2017-12-20 LAB — SURGICAL PATHOLOGY

## 2017-12-21 DIAGNOSIS — Z0000 Encounter for general adult medical examination without abnormal findings: Secondary | ICD-10-CM

## 2017-12-21 LAB — PAP LB AND HPV HIGH-RISK
HPV, HIGH-RISK: NEGATIVE
PAP Smear Comment: 0

## 2017-12-22 LAB — CULTURE, URINE COMPREHENSIVE

## 2017-12-23 NOTE — Progress Notes (Signed)
[place]  [phone] [phone]) [phone]  ID: Patricia Rojas OB: Dec 05, 1966  MR#: [id number]  PPI#:[id number]  Patient Care Team: [name], MD as PCP - General (Infectious Diseases) Clent Jacks, RN as Registered Nurse  CHIEF COMPLAINT: Squamous cell carcinoma of the cervix.  INTERVAL HISTORY: Patient returns to clinic today for further evaluation, discussion of her biopsy results, and discussion of additional diagnostic planning.  She currently feels well and at her baseline. She continues to have good days and bad days.  Her appetite has mildly improved.  She has no neurologic complaints. She denies any recent fevers or illnesses. She has no chest pain or shortness of breath. She denies any constipation or diarrhea. She has no urinary complaints. Patient offers no further specific complaints today.  REVIEW OF SYSTEMS:   Review of Systems  Constitutional: Positive for malaise/fatigue. Negative for fever and weight loss.  Respiratory: Negative.  Negative for cough and shortness of breath.   Cardiovascular: Negative.  Negative for chest pain and leg swelling.  Gastrointestinal: Positive for nausea. Negative for abdominal pain, constipation and diarrhea.  Genitourinary: Negative.  Negative for flank pain.  Skin: Negative.  Negative for rash.  Neurological: Positive for weakness.  Psychiatric/Behavioral: Positive for depression. The patient is nervous/anxious.     As per HPI. Otherwise, a complete review of systems is negative.  PAST MEDICAL HISTORY: Past Medical History:  Diagnosis Date  . Cancer (HCC)    cervical  . Constipation   . Hemorrhoids   . N&V (nausea and vomiting)   . PONV (postoperative nausea and vomiting)     PAST SURGICAL HISTORY: Past Surgical History:  Procedure Laterality Date  . CESAREAN SECTION  1986  . CHOLECYSTECTOMY    . HERNIA REPAIR    . IR GENERIC HISTORICAL  02/23/2017   IR NEPHROSTOMY PLACEMENT RIGHT  02/23/2017 [name], MD [facility]-INTERV RAD  . IR NEPHROSTOMY EXCHANGE RIGHT  04/18/2017  . IR NEPHROSTOMY EXCHANGE RIGHT  06/08/2017  . IR NEPHROSTOMY EXCHANGE RIGHT  08/17/2017  . IR NEPHROSTOMY EXCHANGE RIGHT  10/05/2017  . IR NEPHROSTOMY EXCHANGE RIGHT  12/28/2017    FAMILY HISTORY: Family History  Problem Relation Age of Onset  . Cancer Maternal Uncle   . Cancer Paternal Aunt   . Kidney cancer Neg Hx   . Bladder Cancer Neg Hx     ADVANCED DIRECTIVES (Y/N):  N  HEALTH MAINTENANCE: Social History   Tobacco Use  . Smoking status: Former Smoker    Types: Cigarettes    Last attempt to quit: 1990    Years since quitting: 29.0  . Smokeless tobacco: Never Used  Substance Use Topics  . Alcohol use: No  . Drug use: No    Comment: stopped 3 weeks ago     Colonoscopy:  PAP:  Bone density:  Lipid panel:  No Known Allergies  Current Outpatient Medications  Medication Sig Dispense Refill  . ALPRAZolam (XANAX) 0.25 MG tablet Take 1-2 tabs daily as needed (Patient taking differently: Take 0.125-0.25 mg by mouth daily as needed for anxiety. ) 60 tablet 0  . amLODipine (NORVASC) 2.5 MG tablet Take 1 tablet (2.5 mg total) by mouth daily. 30 tablet 0  . ciprofloxacin (CIPRO) 250 MG tablet Take 1 tablet (250 mg total) by mouth 2 (two) times daily. 6 tablet 0  . HYDROcodone-acetaminophen (NORCO/VICODIN) 5-325 MG tablet Take 1-2 tablets by mouth every 4 (four) hours as needed for moderate pain. 90 tablet 0  .  lactulose (CHRONULAC) 10 GM/15ML solution Take 45 mLs (30 g total) by mouth 2 (two) times daily as needed for mild constipation. 240 mL 0  . ondansetron (ZOFRAN) 8 MG tablet TAKE ONE TABLET BY MOUTH TWICE DAILY AS NEEDED (Patient taking differently: Take 8 mg by mouth twice daily as needed for nausea) 60 tablet 2  . prochlorperazine (COMPAZINE) 10 MG tablet Take 1 tablet (10 mg total) by mouth every 6 (six) hours as needed for nausea or vomiting. 30 tablet 1  . promethazine (PHENERGAN)  25 MG tablet Take 1 tablet (25 mg total) by mouth every 6 (six) hours as needed for nausea or vomiting. 30 tablet 1  . acetaminophen (TYLENOL) 500 MG tablet Take 1,000 mg by mouth daily as needed for moderate pain.    Marland Kitchen docusate sodium (COLACE) 50 MG capsule Take 50 mg by mouth daily as needed for mild constipation.     No current facility-administered medications for this visit.     OBJECTIVE: Vitals:   12/26/17 1512  BP: 118/68  Pulse: 90  Resp: 20  Temp: 98.4 F (36.9 C)     Body mass index is 38.66 kg/m.    ECOG FS:1 - Symptomatic but completely ambulatory  General: Well-developed, well-nourished, no acute distress. Eyes: Pink conjunctiva, anicteric sclera. Lungs: Clear to auscultation bilaterally. Heart: Regular rate and rhythm. No rubs, murmurs, or gallops. Abdomen: Soft, nontender, nondistended. No organomegaly noted, normoactive bowel sounds. GU: Exam recently performed at [facility]. Musculoskeletal: No edema, cyanosis, or clubbing. Neuro: Alert, answering all questions appropriately. Cranial nerves grossly intact. Skin: No rashes or petechiae noted. Psych: Normal affect.   LAB RESULTS:  Lab Results  Component Value Date   NA 136 12/17/2017   K 3.8 12/17/2017   CL 103 12/17/2017   CO2 25 12/17/2017   GLUCOSE 91 12/17/2017   BUN 20 12/17/2017   CREATININE 1.59 (H) 12/17/2017   CALCIUM 8.6 (L) 12/17/2017   PROT 9.2 (H) 09/10/2017   ALBUMIN 3.8 09/10/2017   AST 17 09/10/2017   ALT 12 (L) 09/10/2017   ALKPHOS 92 09/10/2017   BILITOT 0.5 09/10/2017   GFRNONAA 37 (L) 12/17/2017   GFRAA 42 (L) 12/17/2017    Lab Results  Component Value Date   WBC 7.9 12/17/2017   NEUTROABS 5.9 12/17/2017   HGB 9.7 (L) 12/17/2017   HCT 29.7 (L) 12/17/2017   MCV 83.1 12/17/2017   PLT 342 12/17/2017     STUDIES: Ir Nephrostomy Exchange Right  Result Date: 12/28/2017 INDICATION: History of endometrial mass with distal right ureteral obstruction and hydronephrosis,  post ultrasound fluoroscopic guided nephrostomy catheter placement on 02/23/2017 Patient has subsequently undergone serial fluoroscopic guided exchanges and returns today for routine exchange. Note, patient has discussed potential ureteral stent placement however at the present time, Dr. Erlene Quan does not feel the patient will be able to tolerate the ureteral stent given chronic pelvic pain. EXAM: FLUOROSCOPIC GUIDED RIGHT SIDED NEPHROSTOMY CATHETER EXCHANGE COMPARISON:  Multiple previous fluoroscopic guided right-sided nephrostomy catheter exchanges, most recently on 10/05/2017; CT abdomen and pelvis - 09/11/2019 CONTRAST:  10 mL Isovue-300 administered into the collecting system FLUOROSCOPY TIME:  48 seconds (26 mGy) COMPLICATIONS: None immediate. TECHNIQUE: Informed written consent was obtained from the patient after a discussion of the risks, benefits and alternatives to treatment. Questions regarding the procedure were encouraged and answered. A timeout was performed prior to the initiation of the procedure. The right flank and external portion of existing nephrostomy catheter were prepped and draped in  the usual sterile fashion. A sterile drape was applied covering the operative field. Maximum barrier sterile technique with sterile gowns and gloves were used for the procedure. A timeout was performed prior to the initiation of the procedure. A pre procedural spot fluoroscopic image was obtained after contrast was injected via the existing nephrostomy catheter demonstrating appropriate positioning within the renal pelvis. The existing nephrostomy catheter was cut and cannulated with a Benson wire which was coiled within the renal pelvis. Under intermittent fluoroscopic guidance, the existing nephrostomy catheter was exchanged for a new 10.2 [place] all-purpose drainage catheter. Contrast injection confirmed appropriate positioning within the renal pelvis and a post exchange fluoroscopic image was obtained. The  catheter was locked and secured to the skin with a suture. A dressing was placed. The patient tolerated the procedure well without immediate postprocedural complication. FINDINGS: The existing nephrostomy catheter is appropriately positioned and functioning. Limited right-sided antegrade nephrostogram demonstrates persistent occlusion of the distal aspect the right ureter at the level of the right hemipelvis. After successful fluoroscopic guided exchange, the new nephrostomy catheter is coiled and locked within the right renal pelvis. IMPRESSION: Successful fluoroscopic guided exchange of right sided 10.2 French percutaneous nephrostomy catheter. Electronically Signed   By: Sandi Mariscal M.D.   On: 12/28/2017 14:11    ASSESSMENT: Squamous cell carcinoma of the cervix, PDL-1 13%.  PLAN:    1. Squamous cell carcinoma of the cervix: Patient completed concurrent chemotherapy and XRT in April 2018. Brachytherapy at [facility] has also been completed.  PET scan results from [facility] on November 29, 2017 reviewed independently with concern of local recurrence of disease.  Patient had a biopsy completed by gynecology oncology last week that was negative, but since there is a high suspicion of recurrent disease patient will go to surgery on January 09, 2018 to have additional biopsies under anesthesia.  She will then return to clinic 1-2 weeks after her biopsy is to discuss the results and treatment planning if necessary.   2. Pain: Patient has discontinued her fentanyl patch.  Continue oxycodone as needed.   3. Nausea: Continue Compazine and Phenergan suppositories as needed.  4. Constipation: Continue MiraLAX as needed. 5. Anxiety: Continue Xanax as needed. 6. Renal insufficiency: Right nephrostomy tube in place.  Continue to monitor creatinine closely.  Continue follow-up with urology as indicated.  Her next appointment is on March 19, 2018.   7. Hypertension: Continue management per primary  care.   Patient expressed understanding and was in agreement with this plan. She also understands that She can call clinic at any time with any questions, concerns, or complaints.   Cancer Staging Malignant neoplasm of cervix [facility]) Staging form: Corpus Uteri - Carcinoma and Carcinosarcoma, AJCC 8th Edition - Clinical stage from 02/18/2017: FIGO Stage IIIC1 (cT3b, cN1, cM0) - Signed by Lloyd Huger, MD on 02/18/2017   Lloyd Huger, MD   12/30/2017 8:20 AM

## 2017-12-24 ENCOUNTER — Telehealth: Payer: Self-pay

## 2017-12-24 MED ORDER — CIPROFLOXACIN HCL 250 MG PO TABS: 250.0000 mg | ORAL_TABLET | Freq: Two times a day (BID) | ORAL | 0 refills | Status: DC

## 2017-12-24 NOTE — Telephone Encounter (Signed)
Spoke with pt regarding neph tube exchange. LM with IR requesting update for when it will be scheduled. Will call pt once appt has been made. Pt voices understanding.

## 2017-12-24 NOTE — Telephone Encounter (Signed)
Spoke with pt and made aware abx were sent to pharmacy. Made pt aware how to take medication. Pt voiced understanding. Pt inquired about when she was going to have neph tube exchanged.

## 2017-12-24 NOTE — Telephone Encounter (Signed)
-----   Message from Hollice Espy, MD sent at 12/22/2017  4:00 PM EST ----- Please prescribe Cipro 250 mg twice daily times 3 days to be started the day before, the day of, and the day after nephrostomy tube exchange.  Hollice Espy, MD

## 2017-12-25 NOTE — Telephone Encounter (Signed)
Made pt aware of nephrostomy tube exchange scheduled on 12/28/2017 with arrival time of 8:30. Instructions given. Pt voices understanding.

## 2017-12-26 ENCOUNTER — Other Ambulatory Visit: Payer: Self-pay

## 2017-12-26 ENCOUNTER — Inpatient Hospital Stay (HOSPITAL_BASED_OUTPATIENT_CLINIC_OR_DEPARTMENT_OTHER): Payer: Medicaid Other | Admitting: Obstetrics and Gynecology

## 2017-12-26 ENCOUNTER — Inpatient Hospital Stay (HOSPITAL_BASED_OUTPATIENT_CLINIC_OR_DEPARTMENT_OTHER): Payer: Medicaid Other | Admitting: Oncology

## 2017-12-26 ENCOUNTER — Encounter: Payer: Self-pay | Admitting: Oncology

## 2017-12-26 ENCOUNTER — Other Ambulatory Visit: Payer: Self-pay | Admitting: Radiology

## 2017-12-26 VITALS — BP 118/68 | HR 90 | Temp 98.4°F | Resp 20 | Wt 225.2 lb

## 2017-12-26 DIAGNOSIS — F419 Anxiety disorder, unspecified: Secondary | ICD-10-CM | POA: Diagnosis not present

## 2017-12-26 DIAGNOSIS — Z923 Personal history of irradiation: Secondary | ICD-10-CM

## 2017-12-26 DIAGNOSIS — C538 Malignant neoplasm of overlapping sites of cervix uteri: Secondary | ICD-10-CM | POA: Diagnosis not present

## 2017-12-26 DIAGNOSIS — I10 Essential (primary) hypertension: Secondary | ICD-10-CM | POA: Diagnosis not present

## 2017-12-26 DIAGNOSIS — K5909 Other constipation: Secondary | ICD-10-CM | POA: Diagnosis not present

## 2017-12-26 DIAGNOSIS — Z87891 Personal history of nicotine dependence: Secondary | ICD-10-CM

## 2017-12-26 DIAGNOSIS — R110 Nausea: Secondary | ICD-10-CM

## 2017-12-26 DIAGNOSIS — Z9221 Personal history of antineoplastic chemotherapy: Secondary | ICD-10-CM | POA: Diagnosis not present

## 2017-12-26 DIAGNOSIS — Z79899 Other long term (current) drug therapy: Secondary | ICD-10-CM | POA: Diagnosis not present

## 2017-12-26 NOTE — H&P (View-Only) (Signed)
Gynecologic Oncology Interval Visit   Referring Provider: Dr Christel Mormon  Chief Concern: Stage IIIb cervical cancer with concern for recurrence  Subjective:  Patricia Rojas is a 52 y.o. multiparous female who presents for follow up of Stage IIIb cervical cancer.  She has completed concurrent chemotherapy and external beam XRT in April 2018. [facility] with HDR x 5 on 04/20/2017.  07/25/2017 PET/CT  Impression:  1. Hypermetabolic FDG uptake in the region of the cervix. Post- treatment  changes may contribute to the increased radiotracer activity, but the  degree of radioactivity is concerning for residual disease.   2. No evidence of FDG avid nodal or distant metastatic disease.  Dr. Christel Mormon saw her on 07/25/2017 and had a negative pelvic exam. He recommended repeat imaging in 3 months at [facility].  This was done two weeks ago and showed persistent decreased size of FDG activity in cervix, but increased SUV max.  She continues to have right PCN and has constant RLQ pain treated with oxycodone.  No bleeding or discharge.   Endocervical curretage last week negative, but there is some nodularity in right parametria.   Gynecologic Oncology History Patricia Rojas is a pleasant multiparous female diagnosed with stage IIIb cervical cancer who is seen in consultation from ED. Please see prior notes for details.   02/07/17 CT scan shows large cervical mass and and right hydronephrosis. 1. Near circumferential irregular masslike density involving the lower uterus or cervix highly concerning for primary neoplasm. 2. Uterine/cervical mass causes obstruction of the distal right ureter in the pelvis with moderate hydroureteronephrosis. 3. Left external 8 mm iliac node is not enlarged by size criteria, however is indeterminate. 4. Colonic diverticulosis without diverticulitis. 5. Small nodes adjacent to the distal esophagus are of uncertain etiology and significance.   02/07/17 Cervical  biopsy invasive squamous cell carcinoma. She was treated with primary chemoradiation. She has completed concurrent chemotherapy and external beam XRT in April 2018. Brachytherapy at [facility] was completed with HDR x 5 on 04/20/2017.  Required right PCN for ureteral obstruction. 08/12/2017-08/15/2017 [place] admission for urinary tract infection associated with nephrostomy catheter.   Past Medical History: Past Medical History:  Diagnosis Date  . Cancer (HCC)    cervical  . Constipation   . Hemorrhoids   . N&V (nausea and vomiting)   . PONV (postoperative nausea and vomiting)     Past Surgical History: Past Surgical History:  Procedure Laterality Date  . CESAREAN SECTION  1986  . CHOLECYSTECTOMY    . HERNIA REPAIR    . IR GENERIC HISTORICAL  02/23/2017   IR NEPHROSTOMY PLACEMENT RIGHT 02/23/2017 Patricia Cleveland, MD [facility]-INTERV RAD  . IR NEPHROSTOMY EXCHANGE RIGHT  04/18/2017  . IR NEPHROSTOMY EXCHANGE RIGHT  06/08/2017  . IR NEPHROSTOMY EXCHANGE RIGHT  08/17/2017  . IR NEPHROSTOMY EXCHANGE RIGHT  10/05/2017   Family History: Family History  Problem Relation Age of Onset  . Cancer Maternal Uncle   . Cancer Paternal Aunt   . Kidney cancer Neg Hx   . Bladder Cancer Neg Hx     Social History: Social History   Socioeconomic History  . Marital status: Single    Spouse name: Not on file  . Number of children: Not on file  . Years of education: Not on file  . Highest education level: Not on file  Social Needs  . Financial resource strain: Not on file  . Food insecurity - worry: Not on file  . Food  insecurity - inability: Not on file  . Transportation needs - medical: Not on file  . Transportation needs - non-medical: Not on file  Occupational History  . Not on file  Tobacco Use  . Smoking status: Former Smoker    Types: Cigarettes    Last attempt to quit: 1990    Years since quitting: 29.0  . Smokeless tobacco: Never Used  Substance and Sexual Activity  . Alcohol use: No    . Drug use: No    Comment: stopped 3 weeks ago  . Sexual activity: Not Currently  Other Topics Concern  . Not on file  Social History Narrative  . Not on file    Allergies: No Known Allergies  Current Medications: Current Outpatient Medications  Medication Sig Dispense Refill  . ALPRAZolam (XANAX) 0.25 MG tablet Take 1-2 tabs daily as needed 60 tablet 0  . amLODipine (NORVASC) 2.5 MG tablet Take 1 tablet (2.5 mg total) by mouth daily. 30 tablet 0  . ciprofloxacin (CIPRO) 250 MG tablet Take 1 tablet (250 mg total) by mouth 2 (two) times daily. 6 tablet 0  . HYDROcodone-acetaminophen (NORCO/VICODIN) 5-325 MG tablet Take 1-2 tablets by mouth every 4 (four) hours as needed for moderate pain. 90 tablet 0  . lactulose (CHRONULAC) 10 GM/15ML solution Take 45 mLs (30 g total) by mouth 2 (two) times daily as needed for mild constipation. 240 mL 0  . ondansetron (ZOFRAN) 8 MG tablet TAKE ONE TABLET BY MOUTH TWICE DAILY AS NEEDED 60 tablet 2  . prochlorperazine (COMPAZINE) 10 MG tablet Take 1 tablet (10 mg total) by mouth every 6 (six) hours as needed for nausea or vomiting. 30 tablet 1  . promethazine (PHENERGAN) 25 MG tablet Take 1 tablet (25 mg total) by mouth every 6 (six) hours as needed for nausea or vomiting. 30 tablet 1   No current facility-administered medications for this visit.     Review of Systems  General: fatigue, decreased appetite  HEENT: no complaints  Lungs: no complaints  Cardiac: no complaints  GI: abdominal pain, nausea/vomiting/constipation  GU: no complaints  Musculoskeletal: no complaints  Extremities: no complaints  Skin: no complaints  Neuro: no complaints  Endocrine: no complaints  Psych: no complaints     Objective:  Physical Examination:    ECOG Performance Status: 1 - Symptomatic but completely ambulatory  General appearance: alert, cooperative and appears stated age HEENT:PERRLA, neck supple with midline trachea and thyroid without masses Lymph  node survey: non-palpable inguinal Heart: regular rate and rhythm Lungs: clear to A and P Abdomen: obese, soft  Extremities: extremities normal, atraumatic, no cyanosis or edema  Neurological exam reveals alert, oriented, normal speech, no focal findings or movement disorder noted.  Pelvic: exam chaperoned by nurse;  Vulva: normal appearing vulva with no masses, tenderness or lesions; Vagina: normal vagina visually but some agglutination broken down at apex; Adnexa:tenderness on the right and no masses but limited exam due to habitus; Cervix: The cervix is firm to palpation and limited mobility, some thickening on the right posterior area, the os is open and PAP brushing and ECC done; Uterus: not palpable. Adnexa/RV: on digital exam there is firmness and nodularity on the right cervix that I may extend to the parametrium.  Lab Review Lab Results  Component Value Date   WBC 7.9 12/17/2017   HGB 9.7 (L) 12/17/2017   HCT 29.7 (L) 12/17/2017   MCV 83.1 12/17/2017   PLT 342 12/17/2017   BMP Latest Ref Rng &  Units 12/17/2017 11/15/2017 10/01/2017  Glucose 65 - 99 mg/dL 91 112(H) 104(H)  BUN 6 - 20 mg/dL 20 28(H) 23(H)  Creatinine 0.44 - 1.00 mg/dL 1.59(H) 2.04(H) 2.01(H)  Sodium 135 - 145 mmol/L 136 136 138  Potassium 3.5 - 5.1 mmol/L 3.8 4.1 4.3  Chloride 101 - 111 mmol/L 103 105 101  CO2 22 - 32 mmol/L 25 22 24   Calcium 8.9 - 10.3 mg/dL 8.6(L) 9.4 9.6   12/06/17 PET image of cervix above     Assessment:  ZAMARIYAH FURUKAWA is a 52 y.o. female diagnosed with stage IIIB cervical squamous cell cancer 2/18.  CT showed the cervical mass extending into the right parametrial with some right ureteral obstruction and hydronephrosis s/p R PCN.  S/p primary chemoradiation and HDR brachytherapy.  Post treatment PET scans have been concerning for treatment effect versus residual disease. Symptoms and exam concerning for persistent disease. No evidence of distant disease.   Endocervical currettage last week  negative.  Medical co-morbidities complicating care: prior chemoradiation.  Plan:   Problem List Items Addressed This Visit      Genitourinary   Malignant neoplasm of cervix ([place]) - Primary     Will plan for EUA, vaginal/cervical biopsies and pelvic true cut biopsies 01/09/18. Risks discussed including bleeding, infection and damage to adjacent structures.   Since PET scan shows no evidence of metastatic disease we can explore surgical management with exenteration if biopsy shows recurrence, but ongoing ureteral obstruction is concerning with regards to attaining surgical margins. Chemotherapy or immunotherapy represent potential non-surgical options.    The patient's diagnosis, an outline of the further diagnostic and laboratory studies which will be required, the recommendation, and alternatives were discussed.  All questions were answered to the patient's satisfaction.   [name], MD

## 2017-12-26 NOTE — Progress Notes (Signed)
Patient here today for results.  

## 2017-12-26 NOTE — Progress Notes (Signed)
Gynecologic Oncology Interval Visit   Referring Provider: Dr Christel Mormon  Chief Concern: Stage IIIb cervical cancer with concern for recurrence  Subjective:  Patricia Rojas is a 52 y.o. multiparous female who presents for follow up of Stage IIIb cervical cancer.  She has completed concurrent chemotherapy and external beam XRT in April 2018. Brachytherapy at [facility] was completed with HDR x 5 on 04/20/2017.  07/25/2017 PET/CT  Impression:  1. Hypermetabolic FDG uptake in the region of the cervix. Post- treatment  changes may contribute to the increased radiotracer activity, but the  degree of radioactivity is concerning for residual disease.   2. No evidence of FDG avid nodal or distant metastatic disease.  Dr. Christel Mormon saw her on 07/25/2017 and had a negative pelvic exam. He recommended repeat imaging in 3 months at [facility].  This was done two weeks ago and showed persistent decreased size of FDG activity in cervix, but increased SUV max.  She continues to have right PCN and has constant RLQ pain treated with oxycodone.  No bleeding or discharge.   Endocervical curretage last week negative, but there is some nodularity in right parametria.   Gynecologic Oncology History Patricia Rojas is a pleasant multiparous female diagnosed with stage IIIb cervical cancer who is seen in consultation from ED. Please see prior notes for details.   02/07/17 CT scan shows large cervical mass and and right hydronephrosis. 1. Near circumferential irregular masslike density involving the lower uterus or cervix highly concerning for primary neoplasm. 2. Uterine/cervical mass causes obstruction of the distal right ureter in the pelvis with moderate hydroureteronephrosis. 3. Left external 8 mm iliac node is not enlarged by size criteria, however is indeterminate. 4. Colonic diverticulosis without diverticulitis. 5. Small nodes adjacent to the distal esophagus are of uncertain etiology and significance.   02/07/17 Cervical  biopsy invasive squamous cell carcinoma. She was treated with primary chemoradiation. She has completed concurrent chemotherapy and external beam XRT in April 2018. Brachytherapy at [facility] was completed with HDR x 5 on 04/20/2017.  Required right PCN for ureteral obstruction. 08/12/2017-08/15/2017 [facility] admission for urinary tract infection associated with nephrostomy catheter.   Past Medical History: Past Medical History:  Diagnosis Date  . Cancer (HCC)    cervical  . Constipation   . Hemorrhoids   . N&V (nausea and vomiting)   . PONV (postoperative nausea and vomiting)     Past Surgical History: Past Surgical History:  Procedure Laterality Date  . CESAREAN SECTION  1986  . CHOLECYSTECTOMY    . HERNIA REPAIR    . IR GENERIC HISTORICAL  02/23/2017   IR NEPHROSTOMY PLACEMENT RIGHT 02/23/2017 Patricia Cleveland, MD [facility]-INTERV RAD  . IR NEPHROSTOMY EXCHANGE RIGHT  04/18/2017  . IR NEPHROSTOMY EXCHANGE RIGHT  06/08/2017  . IR NEPHROSTOMY EXCHANGE RIGHT  08/17/2017  . IR NEPHROSTOMY EXCHANGE RIGHT  10/05/2017   Family History: Family History  Problem Relation Age of Onset  . Cancer Maternal Uncle   . Cancer Paternal Aunt   . Kidney cancer Neg Hx   . Bladder Cancer Neg Hx     Social History: Social History   Socioeconomic History  . Marital status: Single    Spouse name: Not on file  . Number of children: Not on file  . Years of education: Not on file  . Highest education level: Not on file  Social Needs  . Financial resource strain: Not on file  . Food insecurity - worry: Not on file  . Food  insecurity - inability: Not on file  . Transportation needs - medical: Not on file  . Transportation needs - non-medical: Not on file  Occupational History  . Not on file  Tobacco Use  . Smoking status: Former Smoker    Types: Cigarettes    Last attempt to quit: 1990    Years since quitting: 29.0  . Smokeless tobacco: Never Used  Substance and Sexual Activity  . Alcohol use: No    . Drug use: No    Comment: stopped 3 weeks ago  . Sexual activity: Not Currently  Other Topics Concern  . Not on file  Social History Narrative  . Not on file    Allergies: No Known Allergies  Current Medications: Current Outpatient Medications  Medication Sig Dispense Refill  . ALPRAZolam (XANAX) 0.25 MG tablet Take 1-2 tabs daily as needed 60 tablet 0  . amLODipine (NORVASC) 2.5 MG tablet Take 1 tablet (2.5 mg total) by mouth daily. 30 tablet 0  . ciprofloxacin (CIPRO) 250 MG tablet Take 1 tablet (250 mg total) by mouth 2 (two) times daily. 6 tablet 0  . HYDROcodone-acetaminophen (NORCO/VICODIN) 5-325 MG tablet Take 1-2 tablets by mouth every 4 (four) hours as needed for moderate pain. 90 tablet 0  . lactulose (CHRONULAC) 10 GM/15ML solution Take 45 mLs (30 g total) by mouth 2 (two) times daily as needed for mild constipation. 240 mL 0  . ondansetron (ZOFRAN) 8 MG tablet TAKE ONE TABLET BY MOUTH TWICE DAILY AS NEEDED 60 tablet 2  . prochlorperazine (COMPAZINE) 10 MG tablet Take 1 tablet (10 mg total) by mouth every 6 (six) hours as needed for nausea or vomiting. 30 tablet 1  . promethazine (PHENERGAN) 25 MG tablet Take 1 tablet (25 mg total) by mouth every 6 (six) hours as needed for nausea or vomiting. 30 tablet 1   No current facility-administered medications for this visit.     Review of Systems  General: fatigue, decreased appetite  HEENT: no complaints  Lungs: no complaints  Cardiac: no complaints  GI: abdominal pain, nausea/vomiting/constipation  GU: no complaints  Musculoskeletal: no complaints  Extremities: no complaints  Skin: no complaints  Neuro: no complaints  Endocrine: no complaints  Psych: no complaints     Objective:  Physical Examination:    ECOG Performance Status: 1 - Symptomatic but completely ambulatory  General appearance: alert, cooperative and appears stated age HEENT:PERRLA, neck supple with midline trachea and thyroid without masses Lymph  node survey: non-palpable inguinal Heart: regular rate and rhythm Lungs: clear to A and P Abdomen: obese, soft  Extremities: extremities normal, atraumatic, no cyanosis or edema  Neurological exam reveals alert, oriented, normal speech, no focal findings or movement disorder noted.  Pelvic: exam chaperoned by nurse;  Vulva: normal appearing vulva with no masses, tenderness or lesions; Vagina: normal vagina visually but some agglutination broken down at apex; Adnexa:tenderness on the right and no masses but limited exam due to habitus; Cervix: The cervix is firm to palpation and limited mobility, some thickening on the right posterior area, the os is open and PAP brushing and ECC done; Uterus: not palpable. Adnexa/RV: on digital exam there is firmness and nodularity on the right cervix that I may extend to the parametrium.  Lab Review Lab Results  Component Value Date   WBC 7.9 12/17/2017   HGB 9.7 (L) 12/17/2017   HCT 29.7 (L) 12/17/2017   MCV 83.1 12/17/2017   PLT 342 12/17/2017   BMP Latest Ref Rng &  Units 12/17/2017 11/15/2017 10/01/2017  Glucose 65 - 99 mg/dL 91 112(H) 104(H)  BUN 6 - 20 mg/dL 20 28(H) 23(H)  Creatinine 0.44 - 1.00 mg/dL 1.59(H) 2.04(H) 2.01(H)  Sodium 135 - 145 mmol/L 136 136 138  Potassium 3.5 - 5.1 mmol/L 3.8 4.1 4.3  Chloride 101 - 111 mmol/L 103 105 101  CO2 22 - 32 mmol/L 25 22 24   Calcium 8.9 - 10.3 mg/dL 8.6(L) 9.4 9.6   12/06/17 PET image of cervix above     Assessment:  Patricia Rojas is a 52 y.o. female diagnosed with stage IIIB cervical squamous cell cancer 2/18.  CT showed the cervical mass extending into the right parametrial with some right ureteral obstruction and hydronephrosis s/p R PCN.  S/p primary chemoradiation and HDR brachytherapy.  Post treatment PET scans have been concerning for treatment effect versus residual disease. Symptoms and exam concerning for persistent disease. No evidence of distant disease.   Endocervical currettage last week  negative.  Medical co-morbidities complicating care: prior chemoradiation.  Plan:   Problem List Items Addressed This Visit      Genitourinary   Malignant neoplasm of cervix ([place]) - Primary     Will plan for EUA, vaginal/cervical biopsies and pelvic true cut biopsies 01/09/18. Risks discussed including bleeding, infection and damage to adjacent structures.   Since PET scan shows no evidence of metastatic disease we can explore surgical management with exenteration if biopsy shows recurrence, but ongoing ureteral obstruction is concerning with regards to attaining surgical margins. Chemotherapy or immunotherapy represent potential non-surgical options.    The patient's diagnosis, an outline of the further diagnostic and laboratory studies which will be required, the recommendation, and alternatives were discussed.  All questions were answered to the patient's satisfaction.   [name], MD

## 2017-12-27 ENCOUNTER — Other Ambulatory Visit: Payer: Self-pay | Admitting: Radiology

## 2017-12-28 ENCOUNTER — Ambulatory Visit
Admission: RE | Admit: 2017-12-28 | Discharge: 2017-12-28 | Disposition: A | Discharge status: Home / Self Care | Payer: Medicaid Other | Source: Ambulatory Visit | Attending: Urology | Admitting: Urology

## 2017-12-28 ENCOUNTER — Ambulatory Visit: Admission: RE | Admit: 2017-12-28 | Payer: Medicaid Other | Source: Ambulatory Visit

## 2017-12-28 DIAGNOSIS — N131 Hydronephrosis with ureteral stricture, not elsewhere classified: Secondary | ICD-10-CM | POA: Diagnosis not present

## 2017-12-28 DIAGNOSIS — N1330 Unspecified hydronephrosis: Secondary | ICD-10-CM

## 2017-12-28 DIAGNOSIS — Z436 Encounter for attention to other artificial openings of urinary tract: Secondary | ICD-10-CM | POA: Insufficient documentation

## 2017-12-28 HISTORY — PX: IR NEPHROSTOMY EXCHANGE RIGHT: IMG6070

## 2017-12-28 MED ORDER — LIDOCAINE HCL (PF) 1 % IJ SOLN
INTRAMUSCULAR | Status: AC
  Filled 2017-12-28: qty 30

## 2017-12-28 MED ORDER — IOPAMIDOL (ISOVUE-300) INJECTION 61%
30.0000 mL | Freq: Once | INTRAVENOUS | Status: AC | PRN
  Administered 2017-12-28: 09:00:00 10 mL

## 2017-12-28 NOTE — Procedures (Signed)
Pre Procedure Dx: Hydronephrosis Post Procedure Dx: Same  Successful right sided PCN exchange.    EBL: None   No immediate complications.   Jay Conni Knighton, MD Pager #: [phone]   

## 2017-12-31 ENCOUNTER — Telehealth: Payer: Self-pay

## 2017-12-31 NOTE — Telephone Encounter (Signed)
Notified of PAT appointment 1/23 at 1115 in the [facility] [address]. Read back performed. Oncology Nurse Navigator Documentation  Navigator Location: [facility] Onc (12/31/17 0900)   )Navigator Encounter Type: Telephone (12/31/17 0900)                     Patient Visit Type: GynOnc (12/31/17 0900)                              Time Spent with Patient: 15 (12/31/17 0900)

## 2018-01-01 ENCOUNTER — Other Ambulatory Visit: Payer: Self-pay | Admitting: *Deleted

## 2018-01-01 ENCOUNTER — Ambulatory Visit: Payer: Self-pay | Admitting: Urology

## 2018-01-01 ENCOUNTER — Telehealth: Payer: Self-pay | Admitting: Nurse Practitioner

## 2018-01-01 MED ORDER — HYDROCODONE-ACETAMINOPHEN 5-325 MG PO TABS: 1.0000 | ORAL_TABLET | ORAL | 0 refills | Status: DC | PRN

## 2018-01-01 NOTE — Telephone Encounter (Signed)
  Refill requested. [place] reviewed. Results below. Refill sent.

## 2018-01-02 ENCOUNTER — Other Ambulatory Visit: Payer: Self-pay

## 2018-01-02 ENCOUNTER — Encounter
Admission: RE | Admit: 2018-01-02 | Discharge: 2018-01-02 | Disposition: A | Discharge status: Home / Self Care | Payer: Medicaid Other | Source: Ambulatory Visit | Attending: Obstetrics and Gynecology | Admitting: Obstetrics and Gynecology

## 2018-01-02 DIAGNOSIS — Z01812 Encounter for preprocedural laboratory examination: Secondary | ICD-10-CM | POA: Diagnosis present

## 2018-01-02 HISTORY — DX: Gastro-esophageal reflux disease without esophagitis: K21.9

## 2018-01-02 HISTORY — DX: Major depressive disorder, single episode, unspecified: F32.9

## 2018-01-02 HISTORY — DX: Depression, unspecified: F32.A

## 2018-01-02 HISTORY — DX: Anxiety disorder, unspecified: F41.9

## 2018-01-02 LAB — CBC WITH DIFFERENTIAL/PLATELET
Basophils Absolute: 0.1 10*3/uL (ref 0–0.1)
Basophils Relative: 1 %
Eosinophils Absolute: 0.3 10*3/uL (ref 0–0.7)
Eosinophils Relative: 4 %
HEMATOCRIT: 33.2 % — AB (ref 35.0–47.0)
HEMOGLOBIN: 11.1 g/dL — AB (ref 12.0–16.0)
LYMPHS ABS: 1.1 10*3/uL (ref 1.0–3.6)
Lymphocytes Relative: 13 %
MCH: 27.4 pg (ref 26.0–34.0)
MCHC: 33.4 g/dL (ref 32.0–36.0)
MCV: 82 fL (ref 80.0–100.0)
MONOS PCT: 6 %
Monocytes Absolute: 0.5 10*3/uL (ref 0.2–0.9)
NEUTROS ABS: 6.5 10*3/uL (ref 1.4–6.5)
NEUTROS PCT: 76 %
Platelets: 326 10*3/uL (ref 150–440)
RBC: 4.05 MIL/uL (ref 3.80–5.20)
RDW: 17.4 % — ABNORMAL HIGH (ref 11.5–14.5)
WBC: 8.4 10*3/uL (ref 3.6–11.0)

## 2018-01-02 LAB — URINALYSIS, ROUTINE W REFLEX MICROSCOPIC
BACTERIA UA: NONE SEEN
BILIRUBIN URINE: NEGATIVE
Glucose, UA: NEGATIVE mg/dL
Ketones, ur: NEGATIVE mg/dL
Nitrite: NEGATIVE
Protein, ur: 30 mg/dL — AB
SPECIFIC GRAVITY, URINE: 1.016 (ref 1.005–1.030)
pH: 5 (ref 5.0–8.0)

## 2018-01-02 LAB — APTT: aPTT: 30 seconds (ref 24–36)

## 2018-01-02 LAB — PROTIME-INR
INR: 0.94
Prothrombin Time: 12.5 seconds (ref 11.4–15.2)

## 2018-01-02 LAB — BASIC METABOLIC PANEL
ANION GAP: 9 (ref 5–15)
BUN: 19 mg/dL (ref 6–20)
CHLORIDE: 104 mmol/L (ref 101–111)
CO2: 23 mmol/L (ref 22–32)
Calcium: 9.5 mg/dL (ref 8.9–10.3)
Creatinine, Ser: 1.52 mg/dL — ABNORMAL HIGH (ref 0.44–1.00)
GFR calc non Af Amer: 39 mL/min — ABNORMAL LOW (ref >60)
GFR, EST AFRICAN AMERICAN: 45 mL/min — AB (ref >60)
GLUCOSE: 89 mg/dL (ref 65–99)
Potassium: 3.9 mmol/L (ref 3.5–5.1)
Sodium: 136 mmol/L (ref 135–145)

## 2018-01-02 NOTE — Pre-Procedure Instructions (Signed)
Abnormal urinalysis result faxed to Dr Fransisca Connors at the [facility]

## 2018-01-02 NOTE — Patient Instructions (Signed)
Your procedure is scheduled on: January 10, [facility] Report to Same Day Surgery on the 2nd floor in the [place]. To find out your arrival time, please call [phone] between 1PM - 3PM on: Tuesday January 08, 2018  REMEMBER: Instructions that are not followed completely may result in serious medical risk, up to and including death; or upon the discretion of your surgeon and anesthesiologist your surgery may need to be rescheduled.  Do not eat food after midnight the night before your procedure.  No gum chewing or hard candies.  You may however, drink CLEAR liquids up to 2 hours before you are scheduled to arrive at the hospital for your procedure.  Do not drink clear liquids within 2 hours of the start of your surgery.  Clear liquids include: - water  - apple juice without pulp - clear gatorade - black coffee or tea (Do NOT add anything to the coffee or tea) Do NOT drink anything that is not on this list.  Type 1 and Type 2 diabetics should only drink water.  No Alcohol for 24 hours before or after surgery.  No Smoking including e-cigarettes for 24 hours prior to surgery. No chewable tobacco products for at least 6 hours prior to surgery. No nicotine patches on the day of surgery.  Notify your doctor if there is any change in your medical condition (cold, fever, infection).  Do not wear jewelry, make-up, hairpins, clips or nail polish.  Do not wear lotions, powders, or perfumes. You may wear deodorant.  Do not shave 48 hours prior to surgery. Men may shave face and neck.  Contacts and dentures may not be worn into surgery.  Do not bring valuables to the hospital. [facility] is not responsible for any belongings or valuables.   TAKE THESE MEDICATIONS THE MORNING OF SURGERY WITH A SIP OF WATER: AMLODIPINE XANAX IF NEEDED  VICODIN IF NEEDED   Stop Anti-inflammatories such as Advil, Aleve, Ibuprofen, Motrin, Naproxen, Naprosyn, Goodie powder, or aspirin  products. (May take Tylenol or Acetaminophen if needed.)  Stop ANY OVER THE COUNTER supplements until after surgery. (May continue Vitamin D, Vitamin B, and multivitamin.)  If you are being discharged the day of surgery, you will not be allowed to drive home. You will need someone to drive you home and stay with you that night.   Please call the number above if you have any questions about these instructions.

## 2018-01-08 ENCOUNTER — Telehealth: Payer: Self-pay

## 2018-01-08 NOTE — Telephone Encounter (Signed)
Received call from OR in regards to U/A results. Results sent to Dr. Fransisca Connors to review. Oncology Nurse Navigator Documentation  Navigator Location: [facility] (01/08/18 1300)   )Navigator Encounter Type: Telephone (01/08/18 1300)                     Patient Visit Type: GynOnc (01/08/18 1300)                              Time Spent with Patient: 15 (01/08/18 1300)

## 2018-01-09 ENCOUNTER — Encounter
Admission: RE | Disposition: A | Discharge status: Home / Self Care | Payer: Self-pay | Source: Ambulatory Visit | Attending: Obstetrics and Gynecology

## 2018-01-09 ENCOUNTER — Ambulatory Visit: Payer: Self-pay

## 2018-01-09 ENCOUNTER — Ambulatory Visit
Admission: RE | Admit: 2018-01-09 | Discharge: 2018-01-09 | Disposition: A | Discharge status: Home / Self Care | Payer: Medicaid Other | Source: Ambulatory Visit | Attending: Obstetrics and Gynecology | Admitting: Obstetrics and Gynecology

## 2018-01-09 ENCOUNTER — Ambulatory Visit: Payer: Medicaid Other | Admitting: Certified Registered Nurse Anesthetist

## 2018-01-09 DIAGNOSIS — F419 Anxiety disorder, unspecified: Secondary | ICD-10-CM | POA: Insufficient documentation

## 2018-01-09 DIAGNOSIS — Z9221 Personal history of antineoplastic chemotherapy: Secondary | ICD-10-CM | POA: Insufficient documentation

## 2018-01-09 DIAGNOSIS — Z6837 Body mass index (BMI) 37.0-37.9, adult: Secondary | ICD-10-CM | POA: Insufficient documentation

## 2018-01-09 DIAGNOSIS — Z87891 Personal history of nicotine dependence: Secondary | ICD-10-CM | POA: Insufficient documentation

## 2018-01-09 DIAGNOSIS — N135 Crossing vessel and stricture of ureter without hydronephrosis: Secondary | ICD-10-CM | POA: Insufficient documentation

## 2018-01-09 DIAGNOSIS — I10 Essential (primary) hypertension: Secondary | ICD-10-CM | POA: Insufficient documentation

## 2018-01-09 DIAGNOSIS — Z79899 Other long term (current) drug therapy: Secondary | ICD-10-CM | POA: Insufficient documentation

## 2018-01-09 DIAGNOSIS — N1330 Unspecified hydronephrosis: Secondary | ICD-10-CM | POA: Diagnosis not present

## 2018-01-09 DIAGNOSIS — C539 Malignant neoplasm of cervix uteri, unspecified: Secondary | ICD-10-CM | POA: Diagnosis not present

## 2018-01-09 DIAGNOSIS — K219 Gastro-esophageal reflux disease without esophagitis: Secondary | ICD-10-CM | POA: Diagnosis not present

## 2018-01-09 DIAGNOSIS — Z923 Personal history of irradiation: Secondary | ICD-10-CM | POA: Diagnosis not present

## 2018-01-09 DIAGNOSIS — C538 Malignant neoplasm of overlapping sites of cervix uteri: Secondary | ICD-10-CM

## 2018-01-09 DIAGNOSIS — N765 Ulceration of vagina: Secondary | ICD-10-CM | POA: Diagnosis not present

## 2018-01-09 HISTORY — PX: VULVA /PERINEUM BIOPSY: SHX319

## 2018-01-09 LAB — TYPE AND SCREEN
ABO/RH(D): O POS
Antibody Screen: NEGATIVE

## 2018-01-09 LAB — POCT PREGNANCY, URINE: PREG TEST UR: NEGATIVE

## 2018-01-09 SURGERY — EXAM UNDER ANESTHESIA
Anesthesia: General

## 2018-01-09 MED ORDER — ACETAMINOPHEN 10 MG/ML IV SOLN
INTRAVENOUS | Status: DC | PRN
  Administered 2018-01-09: 1000 mg via INTRAVENOUS

## 2018-01-09 MED ORDER — KETOROLAC TROMETHAMINE 30 MG/ML IJ SOLN
INTRAMUSCULAR | Status: AC
  Administered 2018-01-09: 30 mg via INTRAVENOUS
  Filled 2018-01-09: qty 1

## 2018-01-09 MED ORDER — PROPOFOL 500 MG/50ML IV EMUL
INTRAVENOUS | Status: AC
  Filled 2018-01-09: qty 50

## 2018-01-09 MED ORDER — PROPOFOL 10 MG/ML IV BOLUS
INTRAVENOUS | Status: DC | PRN
  Administered 2018-01-09: 200 mg via INTRAVENOUS

## 2018-01-09 MED ORDER — HYDROMORPHONE HCL 1 MG/ML IJ SOLN
0.2500 mg | INTRAMUSCULAR | Status: DC | PRN
  Administered 2018-01-09 (×4): 0.5 mg via INTRAVENOUS

## 2018-01-09 MED ORDER — DEXAMETHASONE SODIUM PHOSPHATE 10 MG/ML IJ SOLN
INTRAMUSCULAR | Status: AC
  Filled 2018-01-09: qty 1

## 2018-01-09 MED ORDER — MIDAZOLAM HCL 2 MG/2ML IJ SOLN
INTRAMUSCULAR | Status: AC
  Filled 2018-01-09: qty 2

## 2018-01-09 MED ORDER — SCOPOLAMINE 1 MG/3DAYS TD PT72
MEDICATED_PATCH | TRANSDERMAL | Status: AC
  Administered 2018-01-09: 1.5 mg via TRANSDERMAL
  Filled 2018-01-09: qty 1

## 2018-01-09 MED ORDER — ONDANSETRON HCL 4 MG/2ML IJ SOLN: 4.0000 mg | Freq: Once | INTRAMUSCULAR | Status: DC | PRN

## 2018-01-09 MED ORDER — CHLORHEXIDINE GLUCONATE CLOTH 2 % EX PADS: 6.0000 | MEDICATED_PAD | Freq: Once | CUTANEOUS | Status: DC

## 2018-01-09 MED ORDER — FENTANYL CITRATE (PF) 100 MCG/2ML IJ SOLN
INTRAMUSCULAR | Status: AC
  Administered 2018-01-09: 25 ug via INTRAVENOUS
  Filled 2018-01-09: qty 2

## 2018-01-09 MED ORDER — SUCCINYLCHOLINE CHLORIDE 20 MG/ML IJ SOLN
INTRAMUSCULAR | Status: DC | PRN
  Administered 2018-01-09: 100 mg via INTRAVENOUS

## 2018-01-09 MED ORDER — FERRIC SUBSULFATE 259 MG/GM EX SOLN
CUTANEOUS | Status: DC | PRN
  Administered 2018-01-09: 1 via TOPICAL

## 2018-01-09 MED ORDER — HYDROMORPHONE HCL 1 MG/ML IJ SOLN
INTRAMUSCULAR | Status: AC
  Administered 2018-01-09: 0.5 mg via INTRAVENOUS
  Filled 2018-01-09: qty 1

## 2018-01-09 MED ORDER — LIDOCAINE HCL (PF) 2 % IJ SOLN
INTRAMUSCULAR | Status: AC
  Filled 2018-01-09: qty 10

## 2018-01-09 MED ORDER — SODIUM CHLORIDE 0.9 % IV SOLN
INTRAVENOUS | Status: DC
  Administered 2018-01-09 (×2): via INTRAVENOUS

## 2018-01-09 MED ORDER — ACETAMINOPHEN NICU IV SYRINGE 10 MG/ML
INTRAVENOUS | Status: AC
  Filled 2018-01-09: qty 1

## 2018-01-09 MED ORDER — FENTANYL CITRATE (PF) 100 MCG/2ML IJ SOLN
25.0000 ug | INTRAMUSCULAR | Status: AC | PRN
  Administered 2018-01-09 (×6): 25 ug via INTRAVENOUS

## 2018-01-09 MED ORDER — FENTANYL CITRATE (PF) 100 MCG/2ML IJ SOLN
INTRAMUSCULAR | Status: DC | PRN
  Administered 2018-01-09 (×4): 50 ug via INTRAVENOUS

## 2018-01-09 MED ORDER — SCOPOLAMINE 1 MG/3DAYS TD PT72
1.0000 | MEDICATED_PATCH | Freq: Once | TRANSDERMAL | Status: DC
  Administered 2018-01-09: 1.5 mg via TRANSDERMAL

## 2018-01-09 MED ORDER — ROCURONIUM BROMIDE 100 MG/10ML IV SOLN
INTRAVENOUS | Status: DC | PRN
  Administered 2018-01-09: 30 mg via INTRAVENOUS
  Administered 2018-01-09: 20 mg via INTRAVENOUS

## 2018-01-09 MED ORDER — DEXAMETHASONE SODIUM PHOSPHATE 10 MG/ML IJ SOLN
INTRAMUSCULAR | Status: DC | PRN
  Administered 2018-01-09: 10 mg via INTRAVENOUS

## 2018-01-09 MED ORDER — ONDANSETRON HCL 4 MG/2ML IJ SOLN
INTRAMUSCULAR | Status: AC
  Filled 2018-01-09: qty 2

## 2018-01-09 MED ORDER — KETOROLAC TROMETHAMINE 30 MG/ML IJ SOLN
30.0000 mg | Freq: Once | INTRAMUSCULAR | Status: AC
  Administered 2018-01-09: 30 mg via INTRAVENOUS

## 2018-01-09 MED ORDER — FENTANYL CITRATE (PF) 100 MCG/2ML IJ SOLN
INTRAMUSCULAR | Status: AC
  Filled 2018-01-09: qty 2

## 2018-01-09 MED ORDER — PROPOFOL 10 MG/ML IV BOLUS
INTRAVENOUS | Status: AC
  Filled 2018-01-09: qty 20

## 2018-01-09 MED ORDER — FAMOTIDINE 20 MG PO TABS
ORAL_TABLET | ORAL | Status: AC
  Administered 2018-01-09: 20 mg via ORAL
  Filled 2018-01-09: qty 1

## 2018-01-09 MED ORDER — DIPHENHYDRAMINE HCL 50 MG/ML IJ SOLN
INTRAMUSCULAR | Status: DC | PRN
  Administered 2018-01-09: 12.5 mg via INTRAVENOUS

## 2018-01-09 MED ORDER — LIDOCAINE-EPINEPHRINE 1 %-1:100000 IJ SOLN
INTRAMUSCULAR | Status: AC
  Filled 2018-01-09: qty 1

## 2018-01-09 MED ORDER — PROPOFOL 500 MG/50ML IV EMUL
INTRAVENOUS | Status: DC | PRN
  Administered 2018-01-09: 125 ug/kg/min via INTRAVENOUS

## 2018-01-09 MED ORDER — MIDAZOLAM HCL 2 MG/2ML IJ SOLN
INTRAMUSCULAR | Status: DC | PRN
  Administered 2018-01-09: 2 mg via INTRAVENOUS

## 2018-01-09 MED ORDER — ONDANSETRON HCL 4 MG/2ML IJ SOLN
INTRAMUSCULAR | Status: DC | PRN
  Administered 2018-01-09: 4 mg via INTRAVENOUS

## 2018-01-09 MED ORDER — FAMOTIDINE 20 MG PO TABS
20.0000 mg | ORAL_TABLET | Freq: Once | ORAL | Status: AC
  Administered 2018-01-09: 20 mg via ORAL

## 2018-01-09 MED ORDER — FERRIC SUBSULFATE 259 MG/GM EX SOLN
CUTANEOUS | Status: AC
  Filled 2018-01-09: qty 8

## 2018-01-09 MED ORDER — SUGAMMADEX SODIUM 200 MG/2ML IV SOLN
INTRAVENOUS | Status: DC | PRN
  Administered 2018-01-09: 204.2 mg via INTRAVENOUS

## 2018-01-09 SURGICAL SUPPLY — 36 items
APPLICATOR COTTON TIP 6IN STRL (MISCELLANEOUS) ×3 IMPLANT
BLADE SURG 15 STRL LF DISP TIS (BLADE) IMPLANT
BLADE SURG 15 STRL SS (BLADE)
BLADE SURG SZ10 CARB STEEL (BLADE) IMPLANT
CANISTER SUCT 1200ML W/VALVE (MISCELLANEOUS) ×3 IMPLANT
CATH ROBINSON RED A/P 16FR (CATHETERS) ×3 IMPLANT
CNTNR SPEC 2.5X3XGRAD LEK (MISCELLANEOUS)
CONT SPEC 4OZ STER OR WHT (MISCELLANEOUS)
CONTAINER SPEC 2.5X3XGRAD LEK (MISCELLANEOUS) IMPLANT
DRAPE PERI LITHO V/GYN (MISCELLANEOUS) ×3 IMPLANT
DRAPE UNDER BUTTOCK W/FLU (DRAPES) ×3 IMPLANT
DRSG TELFA 3X8 NADH (GAUZE/BANDAGES/DRESSINGS) ×3 IMPLANT
ELECT CAUTERY BLADE 6.4 (BLADE) IMPLANT
ELECT CAUTERY NEEDLE TIP 1.0 (MISCELLANEOUS)
ELECT REM PT RETURN 9FT ADLT (ELECTROSURGICAL) ×3
ELECTRODE CAUTERY NEDL TIP 1.0 (MISCELLANEOUS) IMPLANT
ELECTRODE REM PT RTRN 9FT ADLT (ELECTROSURGICAL) ×1 IMPLANT
GLOVE BIO SURGEON STRL SZ8 (GLOVE) ×6 IMPLANT
GLOVE INDICATOR 8.0 STRL GRN (GLOVE) ×6 IMPLANT
GOWN STRL REUS W/ TWL LRG LVL3 (GOWN DISPOSABLE) ×1 IMPLANT
GOWN STRL REUS W/ TWL XL LVL3 (GOWN DISPOSABLE) ×1 IMPLANT
GOWN STRL REUS W/TWL LRG LVL3 (GOWN DISPOSABLE) ×2
GOWN STRL REUS W/TWL XL LVL3 (GOWN DISPOSABLE) ×2
NEEDLE HYPO 22GX1.5 SAFETY (NEEDLE) ×3 IMPLANT
NS IRRIG 500ML POUR BTL (IV SOLUTION) ×3 IMPLANT
PACK BASIN MINOR ARMC (MISCELLANEOUS) ×3 IMPLANT
PAD OB MATERNITY 4.3X12.25 (PERSONAL CARE ITEMS) ×3 IMPLANT
PAD PREP 24X41 OB/GYN DISP (PERSONAL CARE ITEMS) ×3 IMPLANT
SOL PREP PVP 2OZ (MISCELLANEOUS) ×3
SOLUTION PREP PVP 2OZ (MISCELLANEOUS) ×1 IMPLANT
SPONGE XRAY 4X4 16PLY STRL (MISCELLANEOUS) ×6 IMPLANT
SUT VIC AB 2-0 SH 27 (SUTURE)
SUT VIC AB 2-0 SH 27XBRD (SUTURE) IMPLANT
SUT VIC AB 3-0 SH 27 (SUTURE)
SUT VIC AB 3-0 SH 27X BRD (SUTURE) IMPLANT
SYR CONTROL 10ML (SYRINGE) ×3 IMPLANT

## 2018-01-09 NOTE — Anesthesia Postprocedure Evaluation (Signed)
Anesthesia Post Note  Patient: Patricia Rojas  Procedure(s) Performed: EXAM UNDER ANESTHESIA, PELVIC TRUE CUT BIOPSIES (N/A ) VAGINAL  AND CERVIX BIOPSY (N/A )  Patient location during evaluation: PACU Anesthesia Type: General Level of consciousness: awake and alert Pain management: pain level controlled Vital Signs Assessment: post-procedure vital signs reviewed and stable Respiratory status: spontaneous breathing, nonlabored ventilation, respiratory function stable and patient connected to nasal cannula oxygen Cardiovascular status: blood pressure returned to baseline and stable Postop Assessment: no apparent nausea or vomiting Anesthetic complications: no     Last Vitals:  Vitals:   01/09/18 1035 01/09/18 1052  BP: 122/76 120/66  Pulse: 71 84  Resp: 12 16  Temp: 36.6 C   SpO2: 100% 98%    Last Pain:  Vitals:   01/09/18 1053  TempSrc:   PainSc: 4                  Molli Barrows

## 2018-01-09 NOTE — Anesthesia Preprocedure Evaluation (Signed)
Anesthesia Evaluation  Patient identified by MRN, date of birth, ID band Patient awake    Reviewed: Allergy & Precautions, H&P , NPO status , Patient's Chart, lab work & pertinent test results, reviewed documented beta blocker date and time   History of Anesthesia Complications (+) PONV and history of anesthetic complications  Airway Mallampati: III  TM Distance: >3 FB Neck ROM: full    Dental  (+) Teeth Intact   Pulmonary neg pulmonary ROS, former smoker,    Pulmonary exam normal        Cardiovascular Exercise Tolerance: Good hypertension, negative cardio ROS Normal cardiovascular exam Rhythm:regular Rate:Normal     Neuro/Psych PSYCHIATRIC DISORDERS negative neurological ROS  negative psych ROS   GI/Hepatic negative GI ROS, Neg liver ROS, GERD  Medicated,  Endo/Other  negative endocrine ROSMorbid obesity  Renal/GU Renal diseasenegative Renal ROS  negative genitourinary   Musculoskeletal   Abdominal   Peds  Hematology negative hematology ROS (+)   Anesthesia Other Findings Past Medical History: No date: Anxiety No date: Cancer [facility])     Comment:  cervical No date: Constipation No date: Depression No date: GERD (gastroesophageal reflux disease) No date: Hemorrhoids No date: N&V (nausea and vomiting) No date: PONV (postoperative nausea and vomiting) Past Surgical History: 1986: CESAREAN SECTION No date: CHOLECYSTECTOMY No date: HERNIA REPAIR 02/23/2017: IR GENERIC HISTORICAL     Comment:  IR NEPHROSTOMY PLACEMENT RIGHT 02/23/2017 [name],              MD [facility]-INTERV RAD 04/18/2017: IR NEPHROSTOMY EXCHANGE RIGHT 06/08/2017: IR NEPHROSTOMY EXCHANGE RIGHT 08/17/2017: IR NEPHROSTOMY EXCHANGE RIGHT 10/05/2017: IR NEPHROSTOMY EXCHANGE RIGHT 12/28/2017: IR NEPHROSTOMY EXCHANGE RIGHT   Reproductive/Obstetrics negative OB ROS                             Anesthesia Physical Anesthesia  Plan  ASA: II  Anesthesia Plan: General ETT   Post-op Pain Management:    Induction:   PONV Risk Score and Plan:   Airway Management Planned:   Additional Equipment:   Intra-op Plan:   Post-operative Plan:   Informed Consent: I have reviewed the patients History and Physical, chart, labs and discussed the procedure including the risks, benefits and alternatives for the proposed anesthesia with the patient or authorized representative who has indicated his/her understanding and acceptance.   Dental Advisory Given  Plan Discussed with: CRNA  Anesthesia Plan Comments:         Anesthesia Quick Evaluation

## 2018-01-09 NOTE — Transfer of Care (Signed)
Immediate Anesthesia Transfer of Care Note  Patient: Patricia Rojas  Procedure(s) Performed: EXAM UNDER ANESTHESIA, PELVIC TRUE CUT BIOPSIES (N/A ) VAGINAL  AND CERVIX BIOPSY (N/A )  Patient Location: PACU  Anesthesia Type:General  Level of Consciousness: awake, alert  and oriented  Airway & Oxygen Therapy: Patient Spontanous Breathing and Patient connected to face mask oxygen  Post-op Assessment: Report given to RN and Post -op Vital signs reviewed and stable  Post vital signs: Reviewed and stable  Last Vitals:  Vitals:   01/09/18 0608  BP: (!) 138/91  Pulse: 89  Resp: 14  Temp: (!) 36.2 C  SpO2: 100%    Last Pain:  Vitals:   01/09/18 0608  TempSrc: Tympanic  PainSc: 0-No pain         Complications: No apparent anesthesia complications

## 2018-01-09 NOTE — Discharge Instructions (Signed)
AMBULATORY SURGERY  DISCHARGE INSTRUCTIONS   1) The drugs that you were given will stay in your system until tomorrow so for the next 24 hours you should not:  A) Drive an automobile B) Make any legal decisions C) Drink any alcoholic beverage   2) You may resume regular meals tomorrow.  Today it is better to start with liquids and gradually work up to solid foods.  You may eat anything you prefer, but it is better to start with liquids, then soup and crackers, and gradually work up to solid foods.   3) Please notify your doctor immediately if you have any unusual bleeding, trouble breathing, redness and pain at the surgery site, drainage, fever, or pain not relieved by medication. 4)   5) Your post-operative visit with Dr.  Fransisca Connors, will call with results                               Please call to schedule your post-operative visit.  6) Additional Instructions:      Cervical Biopsy, Care After Refer to this sheet in the next few weeks. These instructions provide you with information about caring for yourself after your procedure. Your health care provider may also give you more specific instructions. Your treatment has been planned according to current medical practices, but problems sometimes occur. Call your health care provider if you have any problems or questions after your procedure. What can I expect after the procedure? After the procedure, it is common to have:  Cramping or mild pain for a few days.  Slight bleeding from the vagina for a few days.  Dark-colored vaginal discharge for a few days.  Follow these instructions at home:  Take over-the-counter and prescription medicines only as told by your health care provider.  Return to your normal activities as told by your health care provider. Ask your health care provider what activities are safe for you.  Use a sanitary napkin until bleeding and discharge stop.  Do not use tampons until your health care  provider approves.  Do not douche until your health care provider approves.  Do not have sex until your health care provider approves.  Keep all follow-up visits as told by your health care provider. This is important. Contact a health care provider if:  You have a fever or chills.  You have bad-smelling vaginal discharge.  You have itching or irritation around the vagina.  You have lower abdominal pain. Get help right away if:  You develop heavy vaginal bleeding that soaks more than one sanitary pad an hour.  You faint.  You have very bad lower abdominal pain. This information is not intended to replace advice given to you by your health care provider. Make sure you discuss any questions you have with your health care provider. Document Released: 08/18/2015 Document Revised: 05/04/2016 Document Reviewed: 04/14/2015 Elsevier Interactive Patient Education  2018 Elsevier Inc.  [name], MD

## 2018-01-09 NOTE — Op Note (Signed)
   Operative Note  01/09/2018 9:01 AM  PRE-OP DIAGNOSIS: CERVICAL CANCER s/p CHEMORADIATION, CONCERN FOR RECURRENCE    POST-OP DIAGNOSIS: SAME  SURGEON: Surgeon(s) and Role:    [name], MD - Primary  ANESTHESIA: General ET  PROCEDURE: EXAM UNDER ANESTHESIA, TRU CUT BIOPSY RIGHT PARAMETRIA VAGINAL BIOPSIES   ESTIMATED BLOOD LOSS: less than 50 mL  DRAINS: NONE   TOTAL IV FLUIDS: 1000 ML  SPECIMENS: RIGHT VAGINAL FORNIX, RIGHT PARAMETRIA   COMPLICATIONS: NONE  DISPOSITION: PACU - hemodynamically stable.  CONDITION: stable  INDICATIONS: Patient underwent chemoradiation last year for cervical cancer.  She has persistent right hydronephrosis with need for PCN and PET positive activity in the right parametria concerning for recurrent cancer.  Prior cervical/vaginal biopsies in clinic negative.  Plan is for additional biopsies of vagina and tru cut biopsy to get a deeper sample of the parametria.  FINDINGS: Exam under anesthesia revealed no palpable masses and the parametria are smooth bilaterally. There is an area of necrosis in the right vaginal fornix of about 3 cm.       PROCEDURE IN DETAIL: After informed consent was obtained, the patient was taken to the operating room where anesthesia was obtained without difficulty. The patient was positioned in the dorsal lithotomy position in [employer] stirrups.  Time-out was performed. The patient was examined under anesthesia, and the above findings were noted. She was prepped and draped in a normal sterile fashion.  The patient's bladder was catheterized with an in and out catheter.    A speculum as placed inside the patient's vagina. A punch biopsy forceps was used to take samples of tissue from the right vaginal fornix.  A tru cut needle was passed twice into the right parametria and only minimal tissue was obtained.  There was some significant bleeding after the tru cut biopsy and the area was compressed for 20 minutes and  stopped completely. Monsels was also placed at the vaginal apex to further assure hemostasis.   The patient tolerated the procedure well.  Sponge, lap and needle counts were correct.  The patient was taken to the recovery room in excellent condition.   Antibiotics: none  VTE prophylaxis: none.  [name], MD

## 2018-01-09 NOTE — Anesthesia Procedure Notes (Signed)
Procedure Name: Intubation Performed by: Demetrius Charity, CRNA Pre-anesthesia Checklist: Patient identified, Patient being monitored, Timeout performed, Emergency Drugs available and Suction available Patient Re-evaluated:Patient Re-evaluated prior to induction Oxygen Delivery Method: Circle system utilized Preoxygenation: Pre-oxygenation with 100% oxygen Induction Type: IV induction Ventilation: Mask ventilation without difficulty Laryngoscope Size: Mac and 3 Grade View: Grade I Tube type: Oral Tube size: 7.0 mm Number of attempts: 1 Airway Equipment and Method: Stylet Placement Confirmation: ETT inserted through vocal cords under direct vision,  positive ETCO2 and breath sounds checked- equal and bilateral Secured at: 23 cm Tube secured with: Tape Dental Injury: Teeth and Oropharynx as per pre-operative assessment

## 2018-01-09 NOTE — Interval H&P Note (Signed)
History and Physical Interval Note:  01/09/2018 7:18 AM  Patricia Rojas  has presented today for surgery, with the diagnosis of CERVICAL CANCER HISTORY  The various methods of treatment have been discussed with the patient and family. After consideration of risks, benefits and other options for treatment, the patient has consented to  Procedure(s): EXAM UNDER ANESTHESIA, PELVIC TRUE CUT BIOPSIES (N/A) VAGINA AND CERVIX BIOPSY (N/A) as a surgical intervention .  The patient's history has been reviewed, patient examined, no change in status, stable for surgery.  I have reviewed the patient's chart and labs.  Questions were answered to the patient's satisfaction.     [name]

## 2018-01-09 NOTE — Anesthesia Post-op Follow-up Note (Signed)
Anesthesia QCDR form completed.        

## 2018-01-10 ENCOUNTER — Encounter: Payer: Self-pay | Admitting: Obstetrics and Gynecology

## 2018-01-14 ENCOUNTER — Telehealth: Payer: Self-pay

## 2018-01-14 LAB — SURGICAL PATHOLOGY

## 2018-01-14 NOTE — Telephone Encounter (Signed)
Called and notified Patricia Rojas of biopsy results below. Will review with Dr. Fransisca Connors for anything further needed. She has follow up with [name] 2/12.  DIAGNOSIS:  A. VAGINAL FORNIX, RIGHT; BIOPSY:  - ULCERATION, MARKED INFLAMMATION, AND STROMAL CYTOLOGIC CHANGES  CONSISTENT WITH RADIATION EFFECT.  - NEGATIVE FOR MALIGNANCY.   B. RIGHT PELVIS; TRU-CUT BIOPSY:  - FIBROADIPOSE TISSUE.  - NEGATIVE FOR MALIGNANCY.    Oncology Nurse Navigator Documentation  Navigator Location: [facility] (01/14/18 1600)   )Navigator Encounter Type: Telephone;Diagnostic Results (01/14/18 1600) Telephone: Outgoing Call;Diagnostic Results (01/14/18 1600)                   Patient Visit Type: GynOnc (01/14/18 1600)                              Time Spent with Patient: 15 (01/14/18 1600)

## 2018-01-16 ENCOUNTER — Other Ambulatory Visit: Payer: Self-pay | Admitting: *Deleted

## 2018-01-16 MED ORDER — PROMETHAZINE HCL 25 MG PO TABS: 25.0000 mg | ORAL_TABLET | Freq: Four times a day (QID) | ORAL | 1 refills | Status: DC | PRN

## 2018-01-16 MED ORDER — HYDROCODONE-ACETAMINOPHEN 5-325 MG PO TABS: 1.0000 | ORAL_TABLET | ORAL | 0 refills | Status: DC | PRN

## 2018-01-18 NOTE — Progress Notes (Signed)
[place]  [phone] [phone]  ID: Patricia Rojas OB: [birth year]  MR#: [id number]  RJJ#:[id number]  Patient Care Team: [name], MD as PCP - General (Infectious Diseases) Clent Jacks, RN as Registered Nurse  CHIEF COMPLAINT: Squamous cell carcinoma of the cervix.  INTERVAL HISTORY: Patient returns to clinic today for further evaluation, discussion of her surgical biopsy results.  She currently feels well and at her baseline. She continues to have good days and bad days.  Her pain is well controlled today.  Her appetite continues to improve.  She has no neurologic complaints. She denies any recent fevers or illnesses. She has no chest pain or shortness of breath. She denies any constipation or diarrhea. She has no urinary complaints. Patient offers no further specific complaints today.  REVIEW OF SYSTEMS:   Review of Systems  Constitutional: Negative for fever, malaise/fatigue and weight loss.  Respiratory: Negative.  Negative for cough and shortness of breath.   Cardiovascular: Negative.  Negative for chest pain and leg swelling.  Gastrointestinal: Negative for abdominal pain, constipation, diarrhea and nausea.  Genitourinary: Negative.  Negative for flank pain.  Skin: Negative.  Negative for rash.  Neurological: Negative.  Negative for weakness.  Psychiatric/Behavioral: Negative for depression. The patient is nervous/anxious.     As per HPI. Otherwise, a complete review of systems is negative.  PAST MEDICAL HISTORY: Past Medical History:  Diagnosis Date  . Anxiety   . Cancer (HCC)    cervical  . Constipation   . Depression   . GERD (gastroesophageal reflux disease)   . Hemorrhoids   . N&V (nausea and vomiting)   . PONV (postoperative nausea and vomiting)     PAST SURGICAL HISTORY: Past Surgical History:  Procedure Laterality Date  . CESAREAN SECTION  1986  . CHOLECYSTECTOMY    . HERNIA REPAIR    . IR GENERIC  HISTORICAL  02/23/2017   IR NEPHROSTOMY PLACEMENT RIGHT 02/23/2017 [name], MD [facility]INTER[facility] RAD  . IR NEPHROSTOMY EXCHANGE RIGHT  04/18/2017  . IR NEPHROSTOMY EXCHANGE RIGHT  06/08/2017  . IR NEPHROSTOMY EXCHANGE RIGHT  08/17/2017  . IR NEPHROSTOMY EXCHANGE RIGHT  10/05/2017  . IR NEPHROSTOMY EXCHANGE RIGHT  12/28/2017  . VULVA /PERINEUM BIOPSY N/A 01/09/2018   Procedure: VAGINAL  AND CERVIX BIOPSY;  Surgeon: [name], MD;  Location: [facility] ORS;  Service: Gynecology;  Laterality: N/A;    FAMILY HISTORY: Family History  Problem Relation Age of Onset  . Cancer Maternal Uncle   . Cancer Paternal Aunt   . Kidney cancer Neg Hx   . Bladder Cancer Neg Hx     ADVANCED DIRECTIVES (Y/N):  N  HEALTH MAINTENANCE: Social History   Tobacco Use  . Smoking status: Former Smoker    Types: Cigarettes    Last attempt to quit: 1990    Years since quitting: 29.1  . Smokeless tobacco: Never Used  Substance Use Topics  . Alcohol use: No  . Drug use: No    Comment: stopped 3 weeks ago     Colonoscopy:  PAP:  Bone density:  Lipid panel:  No Known Allergies  Current Outpatient Medications  Medication Sig Dispense Refill  . acetaminophen (TYLENOL) 500 MG tablet Take 1,000 mg by mouth daily as needed for moderate pain.    Marland Kitchen amLODipine (NORVASC) 2.5 MG tablet Take 1 tablet (2.5 mg total) by mouth daily. (Patient not taking: Reported on 01/25/2018) 30 tablet 0  . docusate sodium (COLACE) 50 MG  capsule Take 50 mg by mouth daily as needed for mild constipation.    Marland Kitchen HYDROcodone-acetaminophen (NORCO/VICODIN) 5-325 MG tablet Take 1-2 tablets by mouth every 4 (four) hours as needed for moderate pain. 90 tablet 0  . ondansetron (ZOFRAN) 8 MG tablet TAKE ONE TABLET BY MOUTH TWICE DAILY AS NEEDED (Patient taking differently: Take 8 mg by mouth twice daily as needed for nausea) 60 tablet 2  . prochlorperazine (COMPAZINE) 10 MG tablet Take 1 tablet (10 mg total) by mouth every 6 (six) hours as needed  for nausea or vomiting. 30 tablet 1  . promethazine (PHENERGAN) 25 MG tablet Take 1 tablet (25 mg total) by mouth every 6 (six) hours as needed for nausea or vomiting. 30 tablet 1  . ALPRAZolam (XANAX) 0.25 MG tablet Take 1-2 tabs daily as needed (Patient not taking: Reported on 01/09/2018) 60 tablet 0  . lactulose (CHRONULAC) 10 GM/15ML solution Take 45 mLs (30 g total) by mouth 2 (two) times daily as needed for mild constipation. (Patient not taking: Reported on 01/09/2018) 240 mL 0   No current facility-administered medications for this visit.     OBJECTIVE: Vitals:   01/22/18 0957  BP: 136/84  Pulse: 89  Resp: 20  Temp: (!) 97.2 F (36.2 C)     Body mass index is 37.91 kg/m.    ECOG FS:1 - Symptomatic but completely ambulatory  General: Well-developed, well-nourished, no acute distress. Eyes: Pink conjunctiva, anicteric sclera. Lungs: Clear to auscultation bilaterally. Heart: Regular rate and rhythm. No rubs, murmurs, or gallops. Abdomen: Soft, nontender, nondistended. No organomegaly noted, normoactive bowel sounds. GU: Exam recently performed at [facility]. Musculoskeletal: No edema, cyanosis, or clubbing. Neuro: Alert, answering all questions appropriately. Cranial nerves grossly intact. Skin: No rashes or petechiae noted. Psych: Normal affect.   LAB RESULTS:  Lab Results  Component Value Date   NA 136 01/02/2018   K 3.9 01/02/2018   CL 104 01/02/2018   CO2 23 01/02/2018   GLUCOSE 89 01/02/2018   BUN 19 01/02/2018   CREATININE 1.52 (H) 01/02/2018   CALCIUM 9.5 01/02/2018   PROT 9.2 (H) 09/10/2017   ALBUMIN 3.8 09/10/2017   AST 17 09/10/2017   ALT 12 (L) 09/10/2017   ALKPHOS 92 09/10/2017   BILITOT 0.5 09/10/2017   GFRNONAA 39 (L) 01/02/2018   GFRAA 45 (L) 01/02/2018    Lab Results  Component Value Date   WBC 8.4 01/02/2018   NEUTROABS 6.5 01/02/2018   HGB 11.1 (L) 01/02/2018   HCT 33.2 (L) 01/02/2018   MCV 82.0 01/02/2018   PLT 326 01/02/2018      STUDIES: Ir Nephrostomy Exchange Right  Result Date: 12/28/2017 INDICATION: History of endometrial mass with distal right ureteral obstruction and hydronephrosis, post ultrasound fluoroscopic guided nephrostomy catheter placement on 02/23/2017 Patient has subsequently undergone serial fluoroscopic guided exchanges and returns today for routine exchange. Note, patient has discussed potential ureteral stent placement however at the present time, Dr. Erlene Quan does not feel the patient will be able to tolerate the ureteral stent given chronic pelvic pain. EXAM: FLUOROSCOPIC GUIDED RIGHT SIDED NEPHROSTOMY CATHETER EXCHANGE COMPARISON:  Multiple previous fluoroscopic guided right-sided nephrostomy catheter exchanges, most recently on 10/05/2017; CT abdomen and pelvis - 09/11/2019 CONTRAST:  10 mL Isovue-300 administered into the collecting system FLUOROSCOPY TIME:  48 seconds (26 mGy) COMPLICATIONS: None immediate. TECHNIQUE: Informed written consent was obtained from the patient after a discussion of the risks, benefits and alternatives to treatment. Questions regarding the procedure were encouraged and  answered. A timeout was performed prior to the initiation of the procedure. The right flank and external portion of existing nephrostomy catheter were prepped and draped in the usual sterile fashion. A sterile drape was applied covering the operative field. Maximum barrier sterile technique with sterile gowns and gloves were used for the procedure. A timeout was performed prior to the initiation of the procedure. A pre procedural spot fluoroscopic image was obtained after contrast was injected via the existing nephrostomy catheter demonstrating appropriate positioning within the renal pelvis. The existing nephrostomy catheter was cut and cannulated with a Benson wire which was coiled within the renal pelvis. Under intermittent fluoroscopic guidance, the existing nephrostomy catheter was exchanged for a new 10.2  [place] all-purpose drainage catheter. Contrast injection confirmed appropriate positioning within the renal pelvis and a post exchange fluoroscopic image was obtained. The catheter was locked and secured to the skin with a suture. A dressing was placed. The patient tolerated the procedure well without immediate postprocedural complication. FINDINGS: The existing nephrostomy catheter is appropriately positioned and functioning. Limited right-sided antegrade nephrostogram demonstrates persistent occlusion of the distal aspect the right ureter at the level of the right hemipelvis. After successful fluoroscopic guided exchange, the new nephrostomy catheter is coiled and locked within the right renal pelvis. IMPRESSION: Successful fluoroscopic guided exchange of right sided 10.2 French percutaneous nephrostomy catheter. Electronically Signed   By: Sandi Mariscal M.D.   On: 12/28/2017 14:11    ASSESSMENT: Squamous cell carcinoma of the cervix, PDL-1 13%.  PLAN:    1. Squamous cell carcinoma of the cervix: Patient completed concurrent chemotherapy and XRT in April 2018. Brachytherapy at [facility] has also been completed.  PET scan results from [facility] on November 29, 2017 reviewed independently with concern of local recurrence of disease, but recent surgical biopsy did not reveal any evidence of malignancy.  No further intervention is needed at this time.  Return to clinic in 3 months with repeat imaging using PET scan and further evaluation.   2. Pain: Patient has discontinued her fentanyl patch.  Continue oxycodone as needed.   3. Nausea: Patient does not complain of this today.  Continue Compazine and Phenergan suppositories as needed.  4. Constipation: Continue MiraLAX as needed. 5. Anxiety: Continue Xanax as needed. 6. Renal insufficiency: Right nephrostomy tube in place.  Continue to monitor creatinine closely.  Continue follow-up with urology as indicated.  Her most recent tube exchange  occurred on December 28, 2017.   7. Hypertension: Continue management per primary care.   Patient expressed understanding and was in agreement with this plan. She also understands that She can call clinic at any time with any questions, concerns, or complaints.   Cancer Staging Malignant neoplasm of cervix [facility]) Staging form: Corpus Uteri - Carcinoma and Carcinosarcoma, AJCC 8th Edition - Clinical stage from 02/18/2017: FIGO Stage IIIC1 (cT3b, cN1, cM0) - Signed by Lloyd Huger, MD on 02/18/2017   Lloyd Huger, MD   01/25/2018 12:39 PM

## 2018-01-22 ENCOUNTER — Inpatient Hospital Stay: Payer: Medicaid Other | Attending: Oncology | Admitting: Oncology

## 2018-01-22 VITALS — BP 136/84 | HR 89 | Temp 97.2°F | Resp 20 | Wt 227.8 lb

## 2018-01-22 DIAGNOSIS — N289 Disorder of kidney and ureter, unspecified: Secondary | ICD-10-CM | POA: Insufficient documentation

## 2018-01-22 DIAGNOSIS — R52 Pain, unspecified: Secondary | ICD-10-CM | POA: Diagnosis not present

## 2018-01-22 DIAGNOSIS — Z936 Other artificial openings of urinary tract status: Secondary | ICD-10-CM | POA: Diagnosis not present

## 2018-01-22 DIAGNOSIS — I10 Essential (primary) hypertension: Secondary | ICD-10-CM | POA: Insufficient documentation

## 2018-01-22 DIAGNOSIS — C538 Malignant neoplasm of overlapping sites of cervix uteri: Secondary | ICD-10-CM

## 2018-01-22 DIAGNOSIS — C539 Malignant neoplasm of cervix uteri, unspecified: Secondary | ICD-10-CM | POA: Diagnosis not present

## 2018-01-22 NOTE — Progress Notes (Signed)
Patient denies any concerns today.  

## 2018-01-25 ENCOUNTER — Telehealth: Payer: Self-pay | Admitting: Radiology

## 2018-01-25 ENCOUNTER — Other Ambulatory Visit: Payer: Self-pay | Admitting: Radiology

## 2018-01-25 ENCOUNTER — Telehealth: Payer: Self-pay

## 2018-01-25 ENCOUNTER — Ambulatory Visit (INDEPENDENT_AMBULATORY_CARE_PROVIDER_SITE_OTHER): Payer: Medicaid Other | Admitting: Urology

## 2018-01-25 ENCOUNTER — Encounter: Payer: Self-pay | Admitting: Urology

## 2018-01-25 VITALS — BP 107/63 | HR 87 | Ht 65.0 in | Wt 230.0 lb

## 2018-01-25 DIAGNOSIS — C539 Malignant neoplasm of cervix uteri, unspecified: Secondary | ICD-10-CM

## 2018-01-25 DIAGNOSIS — N135 Crossing vessel and stricture of ureter without hydronephrosis: Secondary | ICD-10-CM

## 2018-01-25 NOTE — Telephone Encounter (Signed)
Called and spoke with Patricia Rojas. Dr. Fransisca Connors would like for her to follow up and see Dr. Christel Mormon in the next 1-2 months. She stated she would call and get appointment. Oncology Nurse Navigator Documentation  Navigator Location: [facility] (01/25/18 1400)   )Navigator Encounter Type: Telephone (01/25/18 1400) Telephone: [place] Call (01/25/18 1400)                   Patient Visit Type: GynOnc (01/25/18 1400)                              Time Spent with Patient: 15 (01/25/18 1400)

## 2018-01-25 NOTE — Telephone Encounter (Signed)
Pt notified of appt for stent placement in IR. Questions answered. Pt has no further questions at this time.

## 2018-01-25 NOTE — Progress Notes (Signed)
01/25/2018 9:18 AM   Patricia Rojas 20-Apr-1966 [id number]  Referring provider: [name], MD [name], [name] [id number]  Chief Complaint  Patient presents with  . Follow-up    HPI: 52 year old woman who returns today to discuss further management of her right ureteral obstruction.  She has a personal history of stage IIIb cervical cancer with resulting right hydronephrosis managed with a right PCN tube.  She has had previous failed clamp trials.  Most recent nephrostogram on 12/28/2017 showed continued persistent lesion of the distal aspect of the right ureter at the level of the right hemi-pelvis.  Most recently, she is undergone extensive workup to assess for possible residual disease in her pelvis.  She had low-grade metabolic activity on PET scan.  Tru-Cut biopsy recently performed by Dr. [name] shows no evidence of disease, biopsy consistent with inflammation and radiation changes.  Her creatinine remains elevated 1.59 as of 1/7/19from her previous baseline of 0.68 prior to her dx.  She is extremely anxious to get back to work and back to normal life.  She continues to struggle with chronic pelvic pain.   PMH: Past Medical History:  Diagnosis Date  . Anxiety   . Cancer (HCC)    cervical  . Constipation   . Depression   . GERD (gastroesophageal reflux disease)   . Hemorrhoids   . N&V (nausea and vomiting)   . PONV (postoperative nausea and vomiting)     Surgical History: Past Surgical History:  Procedure Laterality Date  . CESAREAN SECTION  1986  . CHOLECYSTECTOMY    . HERNIA REPAIR    . IR GENERIC HISTORICAL  02/23/2017   IR NEPHROSTOMY PLACEMENT RIGHT 02/23/2017 [name], MD [facility]INTER[facility] RAD  . IR NEPHROSTOMY EXCHANGE RIGHT  04/18/2017  . IR NEPHROSTOMY EXCHANGE RIGHT  06/08/2017  . IR NEPHROSTOMY EXCHANGE RIGHT  08/17/2017  . IR NEPHROSTOMY EXCHANGE RIGHT  10/05/2017  . IR NEPHROSTOMY EXCHANGE RIGHT  12/28/2017  . VULVA  /PERINEUM BIOPSY N/A 01/09/2018   Procedure: VAGINAL  AND CERVIX BIOPSY;  Surgeon: [name], MD;  Location: [facility] ORS;  Service: Gynecology;  Laterality: N/A;    Home Medications:  Allergies as of 01/25/2018   No Known Allergies     Medication List        Accurate as of 01/25/18  9:18 AM. Always use your most recent med list.          acetaminophen 500 MG tablet Commonly known as:  TYLENOL Take 1,000 mg by mouth daily as needed for moderate pain.   ALPRAZolam 0.25 MG tablet Commonly known as:  XANAX Take 1-2 tabs daily as needed   amLODipine 2.5 MG tablet Commonly known as:  NORVASC Take 1 tablet (2.5 mg total) by mouth daily.   docusate sodium 50 MG capsule Commonly known as:  COLACE Take 50 mg by mouth daily as needed for mild constipation.   HYDROcodone-acetaminophen 5-325 MG tablet Commonly known as:  NORCO/VICODIN Take 1-2 tablets by mouth every 4 (four) hours as needed for moderate pain.   lactulose 10 GM/15ML solution Commonly known as:  CHRONULAC Take 45 mLs (30 g total) by mouth 2 (two) times daily as needed for mild constipation.   ondansetron 8 MG tablet Commonly known as:  ZOFRAN TAKE ONE TABLET BY MOUTH TWICE DAILY AS NEEDED   prochlorperazine 10 MG tablet Commonly known as:  COMPAZINE Take 1 tablet (10 mg total) by mouth every 6 (six) hours as needed for nausea  or vomiting.   promethazine 25 MG tablet Commonly known as:  PHENERGAN Take 1 tablet (25 mg total) by mouth every 6 (six) hours as needed for nausea or vomiting.       Allergies: No Known Allergies  Family History: Family History  Problem Relation Age of Onset  . Cancer Maternal Uncle   . Cancer Paternal Aunt   . Kidney cancer Neg Hx   . Bladder Cancer Neg Hx     Social History:  reports that she quit smoking about 29 years ago. Her smoking use included cigarettes. she has never used smokeless tobacco. She reports that she does not drink alcohol or use  drugs.  ROS: UROLOGY Frequent Urination?: No Hard to postpone urination?: No Burning/pain with urination?: No Get up at night to urinate?: No Leakage of urine?: No Urine stream starts and stops?: No Trouble starting stream?: No Do you have to strain to urinate?: No Blood in urine?: No Urinary tract infection?: No Sexually transmitted disease?: No Injury to kidneys or bladder?: No Painful intercourse?: No Weak stream?: No Currently pregnant?: No Vaginal bleeding?: No Last menstrual period?: N/A  Gastrointestinal Nausea?: No Vomiting?: No Indigestion/heartburn?: No Diarrhea?: No Constipation?: Yes  Constitutional Fever: No Night sweats?: No Weight loss?: No Fatigue?: No  Skin Skin rash/lesions?: No Itching?: No  Eyes Blurred vision?: No Double vision?: No  Ears/Nose/Throat Sore throat?: No Sinus problems?: No  Hematologic/Lymphatic Swollen glands?: No Easy bruising?: No  Cardiovascular Leg swelling?: No Chest pain?: No  Respiratory Cough?: No Shortness of breath?: No  Endocrine Excessive thirst?: No  Musculoskeletal Back pain?: No Joint pain?: No  Neurological Headaches?: No Dizziness?: No  Psychologic Depression?: No Anxiety?: No  Physical Exam: BP 107/63 (BP Location: Left Arm, Patient Position: Sitting, Cuff Size: Large)   Pulse 87   Ht 5\' 5"  (1.651 m)   Wt 230 lb (104.3 kg)   LMP 02/28/2017 (Approximate)   SpO2 98%   BMI 38.27 kg/m   Constitutional:  Alert and oriented, No acute distress. HEENT: [place] AT, moist mucus membranes.  Trachea midline, no masses. Cardiovascular: No clubbing, cyanosis, or edema. Respiratory: Normal respiratory effort, no increased work of breathing. GI: Abdomen is soft, nontender, nondistended, no abdominal masses.  Obese.   GU: Had a nephrostomy tube in place, secured with butterfly clip and suture.  No surrounding erythema.  Draining clear yellow urine. Skin: No rashes, bruises or suspicious  lesions. Neurologic: Grossly intact, no focal deficits, moving all 4 extremities. Psychiatric: Normal mood and affect.  Laboratory Data: Lab Results  Component Value Date   WBC 8.4 01/02/2018   HGB 11.1 (L) 01/02/2018   HCT 33.2 (L) 01/02/2018   MCV 82.0 01/02/2018   PLT 326 01/02/2018    Lab Results  Component Value Date   CREATININE 1.52 (H) 01/02/2018   Urinalysis N/a  Pertinent Imaging: Limited nephrostogram from 12/28/2017 was personally reviewed today with the patient.  Assessment & Plan:    1. Ureteral obstruction Given that she has no evidence of disease at this point time, would like to consider options for more definitive management of her ureteral obstruction We discussed option of this point time including further diagnostic workup Would recommend internalization of her stent with more formal antegrade nephrostogram to evaluate the degree of ureteral obstruction Thereafter, we can proceed to the operating room for her next ureteral stent exchange with retrograde and possible ureteroscopy or endoscopic intervention is deemed appropriate We also briefly discussed today that at some point in time,  more definitive reconstruction may be necessary such as reimplant She understands all of this is agreeable with formal nephrostograms and antegrade ureteral stent placement - IR URETERAL STENT PLACEMENT EXISTING ACCESS RIGHT; Future - IR NEPHROSTOGRAM RIGHT THRU EXISTING ACCESS; Future  2. Cervical cancer, FIGO stage IIIB (HCC) Status post chemo/radiation No evidence of disease at this point time   Hollice Espy, MD  [place] [address] [place], [place] [address] [phone]  I spent 25 min with this patient of which greater than 50% was spent in counseling and coordination of care with the patient.   Case was discussed with Dr. Fransisca Connors personally.

## 2018-01-25 NOTE — Telephone Encounter (Signed)
-----   Message from Hollice Espy, MD sent at 01/25/2018  9:20 AM EST ----- We will start with internalizing to an indwelling stent and work towards more definitive management of her presumed stricture.

## 2018-01-28 DIAGNOSIS — N135 Crossing vessel and stricture of ureter without hydronephrosis: Secondary | ICD-10-CM

## 2018-01-30 ENCOUNTER — Ambulatory Visit: Payer: Self-pay

## 2018-02-01 ENCOUNTER — Other Ambulatory Visit: Payer: Self-pay | Admitting: *Deleted

## 2018-02-01 MED ORDER — HYDROCODONE-ACETAMINOPHEN 5-325 MG PO TABS: 1.0000 | ORAL_TABLET | ORAL | 0 refills | Status: DC | PRN

## 2018-02-07 ENCOUNTER — Other Ambulatory Visit: Payer: Self-pay | Admitting: Radiology

## 2018-02-08 ENCOUNTER — Ambulatory Visit: Admission: RE | Admit: 2018-02-08 | Payer: Medicaid Other | Source: Ambulatory Visit

## 2018-02-14 ENCOUNTER — Other Ambulatory Visit: Payer: Self-pay | Admitting: Radiology

## 2018-02-15 ENCOUNTER — Other Ambulatory Visit: Payer: Self-pay | Admitting: Radiology

## 2018-02-15 ENCOUNTER — Ambulatory Visit
Admission: RE | Admit: 2018-02-15 | Discharge: 2018-02-15 | Disposition: A | Discharge status: Home / Self Care | Payer: Medicaid Other | Source: Ambulatory Visit | Attending: Urology | Admitting: Urology

## 2018-02-15 ENCOUNTER — Other Ambulatory Visit: Payer: Self-pay | Admitting: Urology

## 2018-02-15 DIAGNOSIS — F419 Anxiety disorder, unspecified: Secondary | ICD-10-CM | POA: Insufficient documentation

## 2018-02-15 DIAGNOSIS — Z87891 Personal history of nicotine dependence: Secondary | ICD-10-CM | POA: Insufficient documentation

## 2018-02-15 DIAGNOSIS — F329 Major depressive disorder, single episode, unspecified: Secondary | ICD-10-CM | POA: Diagnosis not present

## 2018-02-15 DIAGNOSIS — Z8541 Personal history of malignant neoplasm of cervix uteri: Secondary | ICD-10-CM | POA: Diagnosis not present

## 2018-02-15 DIAGNOSIS — Z936 Other artificial openings of urinary tract status: Secondary | ICD-10-CM | POA: Diagnosis not present

## 2018-02-15 DIAGNOSIS — N135 Crossing vessel and stricture of ureter without hydronephrosis: Secondary | ICD-10-CM

## 2018-02-15 DIAGNOSIS — N1330 Unspecified hydronephrosis: Secondary | ICD-10-CM

## 2018-02-15 DIAGNOSIS — K219 Gastro-esophageal reflux disease without esophagitis: Secondary | ICD-10-CM | POA: Insufficient documentation

## 2018-02-15 HISTORY — PX: IR NEPHROSTOGRAM RIGHT THRU EXISTING ACCESS: IMG6062

## 2018-02-15 HISTORY — PX: IR URETERAL STENT PLACEMENT EXISTING ACCESS RIGHT: IMG6074

## 2018-02-15 LAB — BASIC METABOLIC PANEL
Anion gap: 9 (ref 5–15)
BUN: 19 mg/dL (ref 6–20)
CALCIUM: 9.6 mg/dL (ref 8.9–10.3)
CO2: 22 mmol/L (ref 22–32)
CREATININE: 1.23 mg/dL — AB (ref 0.44–1.00)
Chloride: 108 mmol/L (ref 101–111)
GFR calc Af Amer: 58 mL/min — ABNORMAL LOW (ref >60)
GFR calc non Af Amer: 50 mL/min — ABNORMAL LOW (ref >60)
GLUCOSE: 92 mg/dL (ref 65–99)
Potassium: 4.2 mmol/L (ref 3.5–5.1)
Sodium: 139 mmol/L (ref 135–145)

## 2018-02-15 LAB — CBC
HCT: 35.4 % (ref 35.0–47.0)
Hemoglobin: 11.7 g/dL — ABNORMAL LOW (ref 12.0–16.0)
MCH: 27.3 pg (ref 26.0–34.0)
MCHC: 33 g/dL (ref 32.0–36.0)
MCV: 82.9 fL (ref 80.0–100.0)
PLATELETS: 235 10*3/uL (ref 150–440)
RBC: 4.26 MIL/uL (ref 3.80–5.20)
RDW: 16.2 % — ABNORMAL HIGH (ref 11.5–14.5)
WBC: 4.8 10*3/uL (ref 3.6–11.0)

## 2018-02-15 LAB — PROTIME-INR
INR: 0.93
PROTHROMBIN TIME: 12.4 s (ref 11.4–15.2)

## 2018-02-15 MED ORDER — SODIUM CHLORIDE 0.9 % IV SOLN
INTRAVENOUS | Status: DC
  Administered 2018-02-15: 10:00:00 via INTRAVENOUS

## 2018-02-15 MED ORDER — MIDAZOLAM HCL 5 MG/5ML IJ SOLN
INTRAMUSCULAR | Status: AC | PRN
  Administered 2018-02-15 (×2): 1 mg via INTRAVENOUS
  Administered 2018-02-15: 0.5 mg via INTRAVENOUS
  Administered 2018-02-15: 1 mg via INTRAVENOUS
  Administered 2018-02-15: 0.5 mg via INTRAVENOUS

## 2018-02-15 MED ORDER — MIDAZOLAM HCL 5 MG/5ML IJ SOLN
INTRAMUSCULAR | Status: AC
  Filled 2018-02-15: qty 5

## 2018-02-15 MED ORDER — FENTANYL CITRATE (PF) 100 MCG/2ML IJ SOLN
INTRAMUSCULAR | Status: AC
  Filled 2018-02-15: qty 4

## 2018-02-15 MED ORDER — LIDOCAINE HCL (PF) 1 % IJ SOLN
INTRAMUSCULAR | Status: AC
  Filled 2018-02-15: qty 30

## 2018-02-15 MED ORDER — IOHEXOL 300 MG/ML  SOLN
50.0000 mL | Freq: Once | INTRAMUSCULAR | Status: AC | PRN
  Administered 2018-02-15: 11:00:00 10 mL via INTRAVENOUS

## 2018-02-15 MED ORDER — FENTANYL CITRATE (PF) 100 MCG/2ML IJ SOLN
INTRAMUSCULAR | Status: AC | PRN
  Administered 2018-02-15 (×2): 25 ug via INTRAVENOUS
  Administered 2018-02-15: 50 ug via INTRAVENOUS
  Administered 2018-02-15: 25 ug via INTRAVENOUS

## 2018-02-15 MED ORDER — LIDOCAINE HCL (PF) 1 % IJ SOLN
INTRAMUSCULAR | Status: AC | PRN
  Administered 2018-02-15: 5 mL

## 2018-02-15 MED ORDER — CIPROFLOXACIN IN D5W 400 MG/200ML IV SOLN
INTRAVENOUS | Status: AC
  Filled 2018-02-15: qty 200

## 2018-02-15 MED ORDER — CIPROFLOXACIN IN D5W 400 MG/200ML IV SOLN
400.0000 mg | INTRAVENOUS | Status: AC
  Administered 2018-02-15: 400 mg via INTRAVENOUS

## 2018-02-15 NOTE — Procedures (Signed)
Interventional Radiology Procedure Note  Procedure:  Image guided right antegrade nephrostogram, with new right ureteral stent 28cm, and exchange for new PCN, which is capped Complications: None Recommendations:  - Ok to shower - Do not submerge   - Routine care  - 1 hour dC home - will return in about 1 week for fluoro guided removal of the PCN.    Signed,  Dulcy Fanny. Earleen Newport, DO

## 2018-02-15 NOTE — H&P (Signed)
Chief Complaint: Right ureteral obstruction   Referring Physician(s): Hollice Espy  Supervising Physician: Corrie Mckusick  Patient Status: [facility] - Out-pt  History of Present Illness: Patricia Rojas is a 52 y.o. female presents for right antegrade nephrostogram and ureteral stent placement.   Patricia Rojas has a history of right ureteral obstruction secondary to a diagnosis of cervical cancer, stage IIIB.    Prior nephrostomy has been performed.    She now presents for stenting.   Past Medical History:  Diagnosis Date  . Anxiety   . Cancer (HCC)    cervical  . Constipation   . Depression   . GERD (gastroesophageal reflux disease)   . Hemorrhoids   . N&V (nausea and vomiting)   . PONV (postoperative nausea and vomiting)     Past Surgical History:  Procedure Laterality Date  . CESAREAN SECTION  1986  . CHOLECYSTECTOMY    . HERNIA REPAIR    . IR GENERIC HISTORICAL  02/23/2017   IR NEPHROSTOMY PLACEMENT RIGHT 02/23/2017 [name], MD [facility]  . IR NEPHROSTOMY EXCHANGE RIGHT  04/18/2017  . IR NEPHROSTOMY EXCHANGE RIGHT  06/08/2017  . IR NEPHROSTOMY EXCHANGE RIGHT  08/17/2017  . IR NEPHROSTOMY EXCHANGE RIGHT  10/05/2017  . IR NEPHROSTOMY EXCHANGE RIGHT  12/28/2017  . VULVA /PERINEUM BIOPSY N/A 01/09/2018   Procedure: VAGINAL  AND CERVIX BIOPSY;  Surgeon: [name], MD;  Location: [facility] ORS;  Service: Gynecology;  Laterality: N/A;    Allergies: Patient has no known allergies.  Medications: Prior to Admission medications   Medication Sig Start Date End Date Taking? Authorizing Provider  acetaminophen (TYLENOL) 500 MG tablet Take 1,000 mg by mouth daily as needed for moderate pain.   Yes [provider]  ALPRAZolam Duanne Moron) 0.25 MG tablet Take 1-2 tabs daily as needed 02/22/17  Yes Lloyd Huger, MD  amLODipine (NORVASC) 2.5 MG tablet Take 1 tablet (2.5 mg total) by mouth daily. 10/01/17  Yes Lloyd Huger, MD  docusate sodium (COLACE) 50 MG  capsule Take 50 mg by mouth daily as needed for mild constipation.   Yes [provider]  HYDROcodone-acetaminophen (NORCO/VICODIN) 5-325 MG tablet Take 1-2 tablets by mouth every 4 (four) hours as needed for moderate pain. 02/01/18  Yes Lloyd Huger, MD  lactulose (CHRONULAC) 10 GM/15ML solution Take 45 mLs (30 g total) by mouth 2 (two) times daily as needed for mild constipation. 09/13/17  Yes Epifanio Lesches, MD  ondansetron (ZOFRAN) 8 MG tablet TAKE ONE TABLET BY MOUTH TWICE DAILY AS NEEDED Patient taking differently: Take 8 mg by mouth twice daily as needed for nausea 11/09/17  Yes Lloyd Huger, MD  prochlorperazine (COMPAZINE) 10 MG tablet Take 1 tablet (10 mg total) by mouth every 6 (six) hours as needed for nausea or vomiting. 10/01/17  Yes Lloyd Huger, MD  promethazine (PHENERGAN) 25 MG tablet Take 1 tablet (25 mg total) by mouth every 6 (six) hours as needed for nausea or vomiting. 01/16/18  Yes Jacquelin Hawking, NP     Family History  Problem Relation Age of Onset  . Cancer Maternal Uncle   . Cancer Paternal Aunt   . Kidney cancer Neg Hx   . Bladder Cancer Neg Hx     Social History   Socioeconomic History  . Marital status: Single    Spouse name: None  . Number of children: None  . Years of education: None  . Highest education level: None  Social Needs  .  Financial resource strain: None  . Food insecurity - worry: None  . Food insecurity - inability: None  . Transportation needs - medical: None  . Transportation needs - non-medical: None  Occupational History  . None  Tobacco Use  . Smoking status: Former Smoker    Types: Cigarettes    Last attempt to quit: 1990    Years since quitting: 29.2  . Smokeless tobacco: Never Used  Substance and Sexual Activity  . Alcohol use: No  . Drug use: No    Comment: stopped 3 weeks ago  . Sexual activity: Not Currently  Other Topics Concern  . None  Social History Narrative  . None    ECOG  Status: 1 - Symptomatic but completely ambulatory  Review of Systems: A 12 point ROS discussed and pertinent positives are indicated in the HPI above.  All other systems are negative.  Review of Systems  Vital Signs: BP (!) 149/95   Pulse 78   Temp 98.5 F (36.9 C) (Oral)   Resp 15   LMP 02/28/2017 (Approximate)   SpO2 97%   Physical Exam General: 52  yo female appearing   stated age.  Well-developed, well-nourished.  No distress. HEENT: Atraumatic, normocephalic.  Conjugate gaze, extra-ocular motor intact. No scleral icterus or scleral injection. No lesions on external ears, nose, lips, or gums.  Oral mucosa moist, pink.  Neck: Symmetric with no goiter enlargement.  Chest/Lungs:  Symmetric chest with inspiration/expiration.  No labored breathing.  Clear to auscultation with no wheezes, rhonchi, or rales.  Heart:  RRR, with no third heart sounds appreciated. No JVD appreciated.  Abdomen:  Soft, NT/ND, with + bowel sounds.   Genito-urinary: Deferred Neurologic: Alert & Oriented to person, place, and time.   Normal affect and insight.  Appropriate questions.  Moving all 4 extremities with gross sensory intact.   Imaging: No results found.  Labs:  CBC: Recent Labs    11/15/17 1351 12/17/17 1330 01/02/18 1133 02/15/18 0929  WBC 7.0 7.9 8.4 4.8  HGB 9.2* 9.7* 11.1* 11.7*  HCT 28.0* 29.7* 33.2* 35.4  PLT 377 342 326 235    COAGS: Recent Labs    02/23/17 0719 06/10/17 0911 08/17/17 0705 01/02/18 1133 02/15/18 0929  INR 0.99 1.16 1.06 0.94 0.93  APTT 25  --   --  30  --     BMP: Recent Labs    11/15/17 1351 12/17/17 1330 01/02/18 1133 02/15/18 0929  NA 136 136 136 139  K 4.1 3.8 3.9 4.2  CL 105 103 104 108  CO2 22 25 23 22   GLUCOSE 112* 91 89 92  BUN 28* 20 19 19   CALCIUM 9.4 8.6* 9.5 9.6  CREATININE 2.04* 1.59* 1.52* 1.23*  GFRNONAA 27* 37* 39* 50*  GFRAA 31* 42* 45* 58*    LIVER FUNCTION TESTS: Recent Labs    06/27/17 0718 08/12/17 0323  08/23/17 1945 09/10/17 1405  BILITOT 0.4 0.5 0.5 0.5  AST 18 16 21 17   ALT 9* 10* 9* 12*  ALKPHOS 70 72 84 92  PROT 8.5* 8.5* 9.2* 9.2*  ALBUMIN 3.2* 3.7 4.0 3.8    TUMOR MARKERS: No results for input(s): AFPTM, CEA, CA199, CHROMGRNA in the last 8760 hours.  Assessment and Plan:  52 yo female with history of right ureteral obstruction and prior PCN, now presents for antegrade nephrostogram and attempt at ureteral stent placement.    I have discussed with her our plan, which will most likely to be the stent  and a capped PCN, which is a conservative approach to maintain kidney access in the case of a failed stent.  This can be removed at a later date.  She understands and has signed consent.   Risks and benefits discussed with the patient including bleeding, infection, damage to adjacent structures, ureteral injury given presence of cancer/blockage, and sepsis.  All of the patient's questions were answered, patient is agreeable to proceed. Consent signed and in chart.  Thank you for this interesting consult.  I greatly enjoyed meeting Patricia Rojas and look forward to participating in their care.  A copy of this report was sent to the requesting provider on this date.  Electronically Signed: Corrie Mckusick, DO 02/15/2018, 10:03 AM   I spent a total of    25 Minutes in face to face in clinical consultation, greater than 50% of which was counseling/coordinating care for right antegrade nephrostogram, possible antegrade ureteral stent, possible right PCN exchange.

## 2018-02-18 ENCOUNTER — Other Ambulatory Visit: Payer: Self-pay | Admitting: *Deleted

## 2018-02-18 MED ORDER — HYDROCODONE-ACETAMINOPHEN 5-325 MG PO TABS: 1.0000 | ORAL_TABLET | ORAL | 0 refills | Status: DC | PRN

## 2018-02-18 MED ORDER — PROMETHAZINE HCL 25 MG PO TABS: 25.0000 mg | ORAL_TABLET | Freq: Four times a day (QID) | ORAL | 1 refills | Status: DC | PRN

## 2018-02-22 ENCOUNTER — Ambulatory Visit
Admission: RE | Admit: 2018-02-22 | Discharge: 2018-02-22 | Disposition: A | Discharge status: Home / Self Care | Payer: Medicaid Other | Source: Ambulatory Visit | Attending: Interventional Radiology | Admitting: Interventional Radiology

## 2018-02-22 ENCOUNTER — Ambulatory Visit
Admission: RE | Admit: 2018-02-22 | Discharge: 2018-02-22 | Disposition: A | Discharge status: Home / Self Care | Payer: Medicaid Other | Source: Ambulatory Visit | Attending: Urology | Admitting: Urology

## 2018-02-22 DIAGNOSIS — N131 Hydronephrosis with ureteral stricture, not elsewhere classified: Secondary | ICD-10-CM | POA: Insufficient documentation

## 2018-02-22 DIAGNOSIS — Z8541 Personal history of malignant neoplasm of cervix uteri: Secondary | ICD-10-CM | POA: Diagnosis not present

## 2018-02-22 DIAGNOSIS — N1330 Unspecified hydronephrosis: Secondary | ICD-10-CM

## 2018-02-22 MED ORDER — SODIUM CHLORIDE 0.9 % IJ SOLN: 10.0000 mL | INTRAMUSCULAR | Status: DC | PRN

## 2018-02-22 MED ORDER — IOPAMIDOL (ISOVUE-300) INJECTION 61%: 30.0000 mL | Freq: Once | INTRAVENOUS | Status: DC | PRN

## 2018-02-22 NOTE — Procedures (Signed)
Pre Procedure Dx: Hydronephrosis Post Procedural Dx: Same  Right sided antegrade nephrostogram demonstrates wide patency of R double J ureteral stent. Successful fluoro guided removal of R sided PCN.  EBL: Minimal  Complications: None immediate.  Ronny Bacon, MD Pager #: [phone]

## 2018-03-06 ENCOUNTER — Other Ambulatory Visit: Payer: Self-pay | Admitting: *Deleted

## 2018-03-06 MED ORDER — HYDROCODONE-ACETAMINOPHEN 5-325 MG PO TABS: 1.0000 | ORAL_TABLET | ORAL | 0 refills | Status: DC | PRN

## 2018-03-06 NOTE — Telephone Encounter (Signed)
Patient requesting refill of her Norco, she is using #90 approximately every 2 weeks and is NOT on a long acting medicine. Do you want to start her on something long acting? Please advise. Discussed with Dr Grayland Ormond, ok to refill and states he needs to discuss with patient that she needs to go on a long acting medicine.

## 2018-03-19 ENCOUNTER — Ambulatory Visit: Payer: Medicaid Other | Admitting: Urology

## 2018-03-20 ENCOUNTER — Other Ambulatory Visit: Payer: Self-pay | Admitting: Radiology

## 2018-03-20 ENCOUNTER — Encounter: Payer: Self-pay | Admitting: Urology

## 2018-03-20 ENCOUNTER — Other Ambulatory Visit: Payer: Self-pay | Admitting: *Deleted

## 2018-03-20 ENCOUNTER — Ambulatory Visit (INDEPENDENT_AMBULATORY_CARE_PROVIDER_SITE_OTHER): Payer: Medicaid Other | Admitting: Urology

## 2018-03-20 VITALS — BP 134/88 | HR 90 | Ht 65.0 in | Wt 240.0 lb

## 2018-03-20 DIAGNOSIS — C539 Malignant neoplasm of cervix uteri, unspecified: Secondary | ICD-10-CM

## 2018-03-20 DIAGNOSIS — N135 Crossing vessel and stricture of ureter without hydronephrosis: Secondary | ICD-10-CM

## 2018-03-20 LAB — URINALYSIS, COMPLETE
Bilirubin, UA: NEGATIVE
GLUCOSE, UA: NEGATIVE
Ketones, UA: NEGATIVE
NITRITE UA: NEGATIVE
Specific Gravity, UA: 1.02 (ref 1.005–1.030)
Urobilinogen, Ur: 0.2 mg/dL (ref 0.2–1.0)
pH, UA: 5 (ref 5.0–7.5)

## 2018-03-20 LAB — MICROSCOPIC EXAMINATION: WBC, UA: >30 /hpf — AB (ref 0–5)

## 2018-03-20 MED ORDER — OXYBUTYNIN CHLORIDE 5 MG PO TABS: 5.0000 mg | ORAL_TABLET | Freq: Three times a day (TID) | ORAL | 0 refills | Status: DC | PRN

## 2018-03-20 MED ORDER — HYDROCODONE-ACETAMINOPHEN 5-325 MG PO TABS: 1.0000 | ORAL_TABLET | Freq: Four times a day (QID) | ORAL | 0 refills | Status: DC | PRN

## 2018-03-20 NOTE — Progress Notes (Signed)
03/20/2018 8:47 AM   Patricia Rojas 03-Jan-1966 [id number]  Referring provider: [name], MD [place], [place] [address]  Chief Complaint  Patient presents with  . Hydronephrosis    1 month    HPI: 52 year old woman who returns today to discuss further management of her right ureteral obstruction.  She has a personal history of stage IIIb cervical cancer with resulting right hydronephrosis managed with a right PCN tube.  Most recently underwent internalization with a double-J stent followed by staged nephrostomy tube removal.  Antegrade nephrostogram showed narrowing of the right distal ureter at the pelvic inlet.    She prefers the stent to the nephrostomy tube, however, she does have urinary frequency and urgency which is very bothersome to her.  She also has some dull aching pain in the mornings which improves throughout the day.  She is not taking any anticholinergic medications.  No dysuria or gross hematuria.  No fevers or chills.  She is undergone extensive workup to assess for possible residual disease in her pelvis.  She had low-grade metabolic activity on PET scan.  Tru-Cut biopsy recently performed by Dr. [name] shows no evidence of disease, biopsy consistent with inflammation and radiation changes.  Her creatinine remains elevated 1.23 as of 3/8/19from her previous baseline of 0.68 prior to her dx.   PMH: Past Medical History:  Diagnosis Date  . Anxiety   . Cancer (HCC)    cervical  . Constipation   . Depression   . GERD (gastroesophageal reflux disease)   . Hemorrhoids   . N&V (nausea and vomiting)   . PONV (postoperative nausea and vomiting)     Surgical History: Past Surgical History:  Procedure Laterality Date  . CESAREAN SECTION  1986  . CHOLECYSTECTOMY    . HERNIA REPAIR    . IR GENERIC HISTORICAL  02/23/2017   IR NEPHROSTOMY PLACEMENT RIGHT 02/23/2017 [name], MD [facility]  . IR NEPHROSTOGRAM RIGHT THRU  EXISTING ACCESS  02/15/2018  . IR NEPHROSTOMY EXCHANGE RIGHT  04/18/2017  . IR NEPHROSTOMY EXCHANGE RIGHT  06/08/2017  . IR NEPHROSTOMY EXCHANGE RIGHT  08/17/2017  . IR NEPHROSTOMY EXCHANGE RIGHT  10/05/2017  . IR NEPHROSTOMY EXCHANGE RIGHT  12/28/2017  . IR URETERAL STENT PLACEMENT EXISTING ACCESS RIGHT  02/15/2018  . VULVA /PERINEUM BIOPSY N/A 01/09/2018   Procedure: VAGINAL  AND CERVIX BIOPSY;  Surgeon: [name], MD;  Location: [facility] ORS;  Service: Gynecology;  Laterality: N/A;    Home Medications:  Allergies as of 03/20/2018   No Known Allergies     Medication List        Accurate as of 03/20/18  8:47 AM. Always use your most recent med list.          acetaminophen 500 MG tablet Commonly known as:  TYLENOL Take 1,000 mg by mouth daily as needed for moderate pain.   ALPRAZolam 0.25 MG tablet Commonly known as:  XANAX Take 1-2 tabs daily as needed   amLODipine 2.5 MG tablet Commonly known as:  NORVASC Take 1 tablet (2.5 mg total) by mouth daily.   docusate sodium 50 MG capsule Commonly known as:  COLACE Take 50 mg by mouth daily as needed for mild constipation.   lactulose 10 GM/15ML solution Commonly known as:  CHRONULAC Take 45 mLs (30 g total) by mouth 2 (two) times daily as needed for mild constipation.   oxybutynin 5 MG tablet Commonly known as:  DITROPAN Take 1 tablet (5 mg  total) by mouth every 8 (eight) hours as needed for bladder spasms.   prochlorperazine 10 MG tablet Commonly known as:  COMPAZINE Take 1 tablet (10 mg total) by mouth every 6 (six) hours as needed for nausea or vomiting.   promethazine 25 MG tablet Commonly known as:  PHENERGAN Take 1 tablet (25 mg total) by mouth every 6 (six) hours as needed for nausea or vomiting.       Allergies: No Known Allergies  Family History: Family History  Problem Relation Age of Onset  . Cancer Maternal Uncle   . Cancer Paternal Aunt   . Kidney cancer Neg Hx   . Bladder Cancer Neg Hx     Social  History:  reports that she quit smoking about 29 years ago. Her smoking use included cigarettes. She has never used smokeless tobacco. She reports that she does not drink alcohol or use drugs.  ROS: UROLOGY Frequent Urination?: Yes Hard to postpone urination?: Yes Burning/pain with urination?: No Get up at night to urinate?: Yes Leakage of urine?: Yes Urine stream starts and stops?: Yes Trouble starting stream?: No Do you have to strain to urinate?: No Blood in urine?: No Urinary tract infection?: No Sexually transmitted disease?: No Injury to kidneys or bladder?: No Painful intercourse?: No Weak stream?: Yes Currently pregnant?: No Vaginal bleeding?: No Last menstrual period?: n  Gastrointestinal Nausea?: No Vomiting?: No Indigestion/heartburn?: No Diarrhea?: No Constipation?: No  Constitutional Fever: No Night sweats?: No Weight loss?: No Fatigue?: No  Skin Skin rash/lesions?: No Itching?: No  Eyes Blurred vision?: No Double vision?: No  Ears/Nose/Throat Sore throat?: No Sinus problems?: No  Hematologic/Lymphatic Swollen glands?: No Easy bruising?: No  Cardiovascular Leg swelling?: No Chest pain?: No  Respiratory Cough?: No Shortness of breath?: No  Endocrine Excessive thirst?: No  Musculoskeletal Back pain?: Yes Joint pain?: No  Neurological Headaches?: No Dizziness?: No  Psychologic Depression?: No Anxiety?: No  Physical Exam: BP 134/88   Pulse 90   Ht 5\' 5"  (1.651 m)   Wt 240 lb (108.9 kg)   LMP 02/28/2017 (Approximate)   BMI 39.94 kg/m   Constitutional:  Alert and oriented, No acute distress.  More upbeat today. HEENT: [place] AT, moist mucus membranes.  Trachea midline, no masses. Cardiovascular: No clubbing, cyanosis, or edema. Respiratory: Normal respiratory effort, no increased work of breathing. GI: Abdomen is soft, nontender, nondistended, no abdominal masses.  Obese.   Skin: No rashes, bruises or suspicious  lesions. Neurologic: Grossly intact, no focal deficits, moving all 4 extremities. Psychiatric: Normal mood and affect.  Laboratory Data: Lab Results  Component Value Date   WBC 4.8 02/15/2018   HGB 11.7 (L) 02/15/2018   HCT 35.4 02/15/2018   MCV 82.9 02/15/2018   PLT 235 02/15/2018    Lab Results  Component Value Date   CREATININE 1.23 (H) 02/15/2018   Urinalysis N/a  Pertinent Imaging: Antegrade nephrostogram from 02/15/2018 as well as 1540 reviewed personally today.  It appears that there is stenosis and tapering of the right distal ureter near the level of the iliacs.  Antegrade double-J ureteral stent was placed.  Assessment & Plan:    1. Ureteral obstruction Given that she has no evidence of disease at this point time, would like to consider options for more definitive management of her ureteral obstruction Tolerating the stent well with some bladder spasm, will prescribe oxybutynin as needed to help but advised to start stool softener given side effects of dry eyes, dry mouth, constipation We discussed  how to proceed at this point from a stepwise fashion. Options including chronic indwelling double-J ureteral stent, possible endoscopic manipulation/balloon dilation/endopyelotomy versus more definitive ureteral reimplant with possible Boari flap were discussed as options.  Risk and benefits of each technique were discussed.  She will be due for stent exchange in the near future. I recommended proceeding to the operating room for right retrograde pyelogram, ureteral stent exchange with consideration of laser incision of the ureter versus balloon dilation if there is a short area of narrowing which appears amenable to endoscopic manipulation.  If the stricture is relatively long and dense, will plan on just replacing the stent and arranging for definitive treatment.  She understands the risks of the above including eating, infection, damage surrounding structures, ureteral  damage/avulsion.  All questions answered.  2. Cervical cancer, FIGO stage IIIB ([place]) Status post chemo/radiation No evidence of disease at this point time   Hollice Espy, MD  [facility] [address] [place], [place] [address] [phone]

## 2018-03-20 NOTE — H&P (View-Only) (Signed)
03/20/2018 8:47 AM   Patricia Rojas Apr 13, 1966 [id number]  Referring provider: [name], MD [place], [place] [address]  Chief Complaint  Patient presents with  . Hydronephrosis    1 month    HPI: 52 year old woman who returns today to discuss further management of her right ureteral obstruction.  She has a personal history of stage IIIb cervical cancer with resulting right hydronephrosis managed with a right PCN tube.  Most recently underwent internalization with a double-J stent followed by staged nephrostomy tube removal.  Antegrade nephrostogram showed narrowing of the right distal ureter at the pelvic inlet.    She prefers the stent to the nephrostomy tube, however, she does have urinary frequency and urgency which is very bothersome to her.  She also has some dull aching pain in the mornings which improves throughout the day.  She is not taking any anticholinergic medications.  No dysuria or gross hematuria.  No fevers or chills.  She is undergone extensive workup to assess for possible residual disease in her pelvis.  She had low-grade metabolic activity on PET scan.  Tru-Cut biopsy recently performed by Dr. [name] shows no evidence of disease, biopsy consistent with inflammation and radiation changes.  Her creatinine remains elevated 1.23 as of 3/8/19from her previous baseline of 0.68 prior to her dx.   PMH: Past Medical History:  Diagnosis Date  . Anxiety   . Cancer (HCC)    cervical  . Constipation   . Depression   . GERD (gastroesophageal reflux disease)   . Hemorrhoids   . N&V (nausea and vomiting)   . PONV (postoperative nausea and vomiting)     Surgical History: Past Surgical History:  Procedure Laterality Date  . CESAREAN SECTION  1986  . CHOLECYSTECTOMY    . HERNIA REPAIR    . IR GENERIC HISTORICAL  02/23/2017   IR NEPHROSTOMY PLACEMENT RIGHT 02/23/2017 [name], MD [facility]  . IR NEPHROSTOGRAM RIGHT THRU  EXISTING ACCESS  02/15/2018  . IR NEPHROSTOMY EXCHANGE RIGHT  04/18/2017  . IR NEPHROSTOMY EXCHANGE RIGHT  06/08/2017  . IR NEPHROSTOMY EXCHANGE RIGHT  08/17/2017  . IR NEPHROSTOMY EXCHANGE RIGHT  10/05/2017  . IR NEPHROSTOMY EXCHANGE RIGHT  12/28/2017  . IR URETERAL STENT PLACEMENT EXISTING ACCESS RIGHT  02/15/2018  . VULVA /PERINEUM BIOPSY N/A 01/09/2018   Procedure: VAGINAL  AND CERVIX BIOPSY;  Surgeon: [name], MD;  Location: [facility] ORS;  Service: Gynecology;  Laterality: N/A;    Home Medications:  Allergies as of 03/20/2018   No Known Allergies     Medication List        Accurate as of 03/20/18  8:47 AM. Always use your most recent med list.          acetaminophen 500 MG tablet Commonly known as:  TYLENOL Take 1,000 mg by mouth daily as needed for moderate pain.   ALPRAZolam 0.25 MG tablet Commonly known as:  XANAX Take 1-2 tabs daily as needed   amLODipine 2.5 MG tablet Commonly known as:  NORVASC Take 1 tablet (2.5 mg total) by mouth daily.   docusate sodium 50 MG capsule Commonly known as:  COLACE Take 50 mg by mouth daily as needed for mild constipation.   lactulose 10 GM/15ML solution Commonly known as:  CHRONULAC Take 45 mLs (30 g total) by mouth 2 (two) times daily as needed for mild constipation.   oxybutynin 5 MG tablet Commonly known as:  DITROPAN Take 1 tablet (5 mg  total) by mouth every 8 (eight) hours as needed for bladder spasms.   prochlorperazine 10 MG tablet Commonly known as:  COMPAZINE Take 1 tablet (10 mg total) by mouth every 6 (six) hours as needed for nausea or vomiting.   promethazine 25 MG tablet Commonly known as:  PHENERGAN Take 1 tablet (25 mg total) by mouth every 6 (six) hours as needed for nausea or vomiting.       Allergies: No Known Allergies  Family History: Family History  Problem Relation Age of Onset  . Cancer Maternal Uncle   . Cancer Paternal Aunt   . Kidney cancer Neg Hx   . Bladder Cancer Neg Hx     Social  History:  reports that she quit smoking about 29 years ago. Her smoking use included cigarettes. She has never used smokeless tobacco. She reports that she does not drink alcohol or use drugs.  ROS: UROLOGY Frequent Urination?: Yes Hard to postpone urination?: Yes Burning/pain with urination?: No Get up at night to urinate?: Yes Leakage of urine?: Yes Urine stream starts and stops?: Yes Trouble starting stream?: No Do you have to strain to urinate?: No Blood in urine?: No Urinary tract infection?: No Sexually transmitted disease?: No Injury to kidneys or bladder?: No Painful intercourse?: No Weak stream?: Yes Currently pregnant?: No Vaginal bleeding?: No Last menstrual period?: n  Gastrointestinal Nausea?: No Vomiting?: No Indigestion/heartburn?: No Diarrhea?: No Constipation?: No  Constitutional Fever: No Night sweats?: No Weight loss?: No Fatigue?: No  Skin Skin rash/lesions?: No Itching?: No  Eyes Blurred vision?: No Double vision?: No  Ears/Nose/Throat Sore throat?: No Sinus problems?: No  Hematologic/Lymphatic Swollen glands?: No Easy bruising?: No  Cardiovascular Leg swelling?: No Chest pain?: No  Respiratory Cough?: No Shortness of breath?: No  Endocrine Excessive thirst?: No  Musculoskeletal Back pain?: Yes Joint pain?: No  Neurological Headaches?: No Dizziness?: No  Psychologic Depression?: No Anxiety?: No  Physical Exam: BP 134/88   Pulse 90   Ht 5\' 5"  (1.651 m)   Wt 240 lb (108.9 kg)   LMP 02/28/2017 (Approximate)   BMI 39.94 kg/m   Constitutional:  Alert and oriented, No acute distress.  More upbeat today. HEENT: [place] AT, moist mucus membranes.  Trachea midline, no masses. Cardiovascular: No clubbing, cyanosis, or edema. Respiratory: Normal respiratory effort, no increased work of breathing. GI: Abdomen is soft, nontender, nondistended, no abdominal masses.  Obese.   Skin: No rashes, bruises or suspicious  lesions. Neurologic: Grossly intact, no focal deficits, moving all 4 extremities. Psychiatric: Normal mood and affect.  Laboratory Data: Lab Results  Component Value Date   WBC 4.8 02/15/2018   HGB 11.7 (L) 02/15/2018   HCT 35.4 02/15/2018   MCV 82.9 02/15/2018   PLT 235 02/15/2018    Lab Results  Component Value Date   CREATININE 1.23 (H) 02/15/2018   Urinalysis N/a  Pertinent Imaging: Antegrade nephrostogram from 02/15/2018 as well as 3710 reviewed personally today.  It appears that there is stenosis and tapering of the right distal ureter near the level of the iliacs.  Antegrade double-J ureteral stent was placed.  Assessment & Plan:    1. Ureteral obstruction Given that she has no evidence of disease at this point time, would like to consider options for more definitive management of her ureteral obstruction Tolerating the stent well with some bladder spasm, will prescribe oxybutynin as needed to help but advised to start stool softener given side effects of dry eyes, dry mouth, constipation We discussed  how to proceed at this point from a stepwise fashion. Options including chronic indwelling double-J ureteral stent, possible endoscopic manipulation/balloon dilation/endopyelotomy versus more definitive ureteral reimplant with possible Boari flap were discussed as options.  Risk and benefits of each technique were discussed.  She will be due for stent exchange in the near future. I recommended proceeding to the operating room for right retrograde pyelogram, ureteral stent exchange with consideration of laser incision of the ureter versus balloon dilation if there is a short area of narrowing which appears amenable to endoscopic manipulation.  If the stricture is relatively long and dense, will plan on just replacing the stent and arranging for definitive treatment.  She understands the risks of the above including eating, infection, damage surrounding structures, ureteral  damage/avulsion.  All questions answered.  2. Cervical cancer, FIGO stage IIIB ([place]) Status post chemo/radiation No evidence of disease at this point time   Hollice Espy, MD  [facility] [address] [place], [place] [address] [phone]

## 2018-03-21 MED ORDER — OXYBUTYNIN CHLORIDE 5 MG PO TABS: 5.0000 mg | ORAL_TABLET | Freq: Three times a day (TID) | ORAL | 1 refills | Status: DC | PRN

## 2018-03-21 NOTE — Addendum Note (Signed)
Addended by: Tommy Rainwater on: 03/21/2018 09:19 AM   Modules accepted: Orders

## 2018-03-22 ENCOUNTER — Other Ambulatory Visit: Payer: Self-pay

## 2018-03-22 ENCOUNTER — Encounter
Admission: RE | Admit: 2018-03-22 | Discharge: 2018-03-22 | Disposition: A | Discharge status: Home / Self Care | Payer: Medicaid Other | Source: Ambulatory Visit | Attending: Urology | Admitting: Urology

## 2018-03-22 HISTORY — DX: Essential (primary) hypertension: I10

## 2018-03-22 NOTE — Pre-Procedure Instructions (Signed)
ECG 12-lead4/25/2018 [facility] Component Name Value Ref Range  Vent Rate (bpm) 99   PR Interval (msec) 128   QRS Interval (msec) 80   QT Interval (msec) 348   QTc (msec) 446   Result Narrative  Normal sinus rhythm Normal ECG No previous ECGs available I reviewed and concur with this report. Electronically signed [email or web address], MD, AUGUSTUS ([id number]) on 04/04/2017 7:45:45 PM  Status Results Details   Encounter Summary

## 2018-03-22 NOTE — Patient Instructions (Signed)
Your procedure is scheduled on: 03-25-18 Report to Same Day Surgery 2nd floor medical mall [facility] Entrance-take elevator on left to 2nd floor.  Check in with surgery information desk.) To find out your arrival time please call [phone] between 1PM - 3PM on 03-22-18  Remember: Instructions that are not followed completely may result in serious medical risk, up to and including death, or upon the discretion of your surgeon and anesthesiologist your surgery may need to be rescheduled.    _x___ 1. Do not eat food after midnight the night before your procedure. NO GUM OR CANDY AFTER MIDNIGHT.  You may drink clear liquids up to 2 hours before you are scheduled to arrive at the hospital for your procedure.  Do not drink clear liquids within 2 hours of your scheduled arrival to the hospital.  Clear liquids include  --Water or Apple juice without pulp  --Clear carbohydrate beverage such as ClearFast or Gatorade  --Black Coffee or Clear Tea (No milk, no creamers, do not add anything to  the coffee or Tea     __x__ 2. No Alcohol for 24 hours before or after surgery.   __x__3. No Smoking or e-cigarettes for 24 prior to surgery.  Do not use any chewable tobacco products for at least 6 hour prior to surgery   ____  4. Bring all medications with you on the day of surgery if instructed.    __x__ 5. Notify your doctor if there is any change in your medical condition     (cold, fever, infections).    x___6. On the morning of surgery brush your teeth with toothpaste and water.  You may rinse your mouth with mouth wash if you wish.  Do not swallow any toothpaste or mouthwash.   Do not wear jewelry, make-up, hairpins, clips or nail polish.  Do not wear lotions, powders, or perfumes. You may wear deodorant.  Do not shave 48 hours prior to surgery. Men may shave face and neck.  Do not bring valuables to the hospital.    [facility] is not responsible for any belongings or valuables.     Contacts, dentures or bridgework may not be worn into surgery.  Leave your suitcase in the car. After surgery it may be brought to your room.  For patients admitted to the hospital, discharge time is determined by your treatment team.  _  Patients discharged the day of surgery will not be allowed to drive home.  You will need someone to drive you home and stay with you the night of your procedure.    _x___ TAKE THE FOLLOWING MEDICATION THE MORNING OF SURGERY. These include:  1. AMLODIPINE  2. YOU MAY TAKE YOUR XANAX DAY OF SURGERY IF NEEDED  3. YOU MAY TAKE YOUR HYDROCODONE DAY OF SURGERY IF NEEDED WITH SMALL SIP OF WATER  4.  5.  6.  ____Fleets enema or Magnesium Citrate as directed.   ____ Use CHG Soap or sage wipes as directed on instruction sheet   ____ Use inhalers on the day of surgery and bring to hospital day of surgery  ____ Stop Metformin and Janumet 2 days prior to surgery.    ____ Take 1/2 of usual insulin dose the night before surgery and none on the morning surgery.   ____ Follow recommendations from Cardiologist, Pulmonologist or PCP regarding  stopping Aspirin, Coumadin, Plavix ,Eliquis, Effient, or Pradaxa, and Pletal.  X____Stop Anti-inflammatories such as Advil, Aleve, Ibuprofen, Motrin, Naproxen, Naprosyn, Goodies powders or aspirin  products NOW-OK to take Tylenol OR HYDROCODONE IF NEEDED   ____ Stop supplements until after surgery.     ____ Bring C-Pap to the hospital.

## 2018-03-23 LAB — CULTURE, URINE COMPREHENSIVE

## 2018-03-24 MED ORDER — CEFAZOLIN SODIUM-DEXTROSE 2-4 GM/100ML-% IV SOLN
2.0000 g | INTRAVENOUS | Status: AC
  Administered 2018-03-25: 2 g via INTRAVENOUS
  Filled 2018-03-24: qty 100

## 2018-03-25 ENCOUNTER — Other Ambulatory Visit: Payer: Self-pay

## 2018-03-25 ENCOUNTER — Ambulatory Visit: Payer: Medicaid Other | Admitting: Anesthesiology

## 2018-03-25 ENCOUNTER — Ambulatory Visit
Admission: RE | Admit: 2018-03-25 | Discharge: 2018-03-25 | Disposition: A | Discharge status: Home / Self Care | Payer: Medicaid Other | Source: Ambulatory Visit | Attending: Urology | Admitting: Urology

## 2018-03-25 ENCOUNTER — Encounter: Payer: Self-pay | Admitting: *Deleted

## 2018-03-25 ENCOUNTER — Encounter
Admission: RE | Disposition: A | Discharge status: Home / Self Care | Payer: Self-pay | Source: Ambulatory Visit | Attending: Urology

## 2018-03-25 DIAGNOSIS — I10 Essential (primary) hypertension: Secondary | ICD-10-CM | POA: Diagnosis not present

## 2018-03-25 DIAGNOSIS — Z87891 Personal history of nicotine dependence: Secondary | ICD-10-CM | POA: Insufficient documentation

## 2018-03-25 DIAGNOSIS — Z923 Personal history of irradiation: Secondary | ICD-10-CM | POA: Diagnosis not present

## 2018-03-25 DIAGNOSIS — Z8541 Personal history of malignant neoplasm of cervix uteri: Secondary | ICD-10-CM | POA: Insufficient documentation

## 2018-03-25 DIAGNOSIS — F329 Major depressive disorder, single episode, unspecified: Secondary | ICD-10-CM | POA: Insufficient documentation

## 2018-03-25 DIAGNOSIS — Z809 Family history of malignant neoplasm, unspecified: Secondary | ICD-10-CM | POA: Insufficient documentation

## 2018-03-25 DIAGNOSIS — Z9049 Acquired absence of other specified parts of digestive tract: Secondary | ICD-10-CM | POA: Insufficient documentation

## 2018-03-25 DIAGNOSIS — F419 Anxiety disorder, unspecified: Secondary | ICD-10-CM | POA: Insufficient documentation

## 2018-03-25 DIAGNOSIS — N135 Crossing vessel and stricture of ureter without hydronephrosis: Secondary | ICD-10-CM

## 2018-03-25 DIAGNOSIS — Z9221 Personal history of antineoplastic chemotherapy: Secondary | ICD-10-CM | POA: Insufficient documentation

## 2018-03-25 DIAGNOSIS — K5900 Constipation, unspecified: Secondary | ICD-10-CM | POA: Diagnosis not present

## 2018-03-25 DIAGNOSIS — K219 Gastro-esophageal reflux disease without esophagitis: Secondary | ICD-10-CM | POA: Insufficient documentation

## 2018-03-25 DIAGNOSIS — N131 Hydronephrosis with ureteral stricture, not elsewhere classified: Secondary | ICD-10-CM | POA: Diagnosis not present

## 2018-03-25 DIAGNOSIS — F418 Other specified anxiety disorders: Secondary | ICD-10-CM | POA: Insufficient documentation

## 2018-03-25 DIAGNOSIS — R569 Unspecified convulsions: Secondary | ICD-10-CM | POA: Insufficient documentation

## 2018-03-25 HISTORY — PX: CYSTOSCOPY W/ URETERAL STENT PLACEMENT: SHX1429

## 2018-03-25 HISTORY — PX: CYSTOSCOPY W/ RETROGRADES: SHX1426

## 2018-03-25 SURGERY — CYSTOSCOPY, FLEXIBLE, WITH STENT REPLACEMENT
Anesthesia: General | Site: Ureter | Laterality: Right | Wound class: Clean Contaminated

## 2018-03-25 MED ORDER — SCOPOLAMINE 1 MG/3DAYS TD PT72
MEDICATED_PATCH | TRANSDERMAL | Status: AC
  Filled 2018-03-25: qty 1

## 2018-03-25 MED ORDER — DEXAMETHASONE SODIUM PHOSPHATE 10 MG/ML IJ SOLN
INTRAMUSCULAR | Status: AC
  Filled 2018-03-25: qty 1

## 2018-03-25 MED ORDER — ONDANSETRON HCL 4 MG/2ML IJ SOLN
INTRAMUSCULAR | Status: AC
  Filled 2018-03-25: qty 2

## 2018-03-25 MED ORDER — FAMOTIDINE 20 MG PO TABS
ORAL_TABLET | ORAL | Status: AC
  Administered 2018-03-25: 20 mg
  Filled 2018-03-25: qty 1

## 2018-03-25 MED ORDER — MIDAZOLAM HCL 2 MG/2ML IJ SOLN
INTRAMUSCULAR | Status: AC
  Filled 2018-03-25: qty 2

## 2018-03-25 MED ORDER — FENTANYL CITRATE (PF) 100 MCG/2ML IJ SOLN
INTRAMUSCULAR | Status: AC
  Administered 2018-03-25: 25 ug via INTRAVENOUS
  Filled 2018-03-25: qty 2

## 2018-03-25 MED ORDER — LIDOCAINE HCL (CARDIAC) 20 MG/ML IV SOLN
INTRAVENOUS | Status: DC | PRN
  Administered 2018-03-25: 80 mg via INTRAVENOUS

## 2018-03-25 MED ORDER — CEFAZOLIN SODIUM-DEXTROSE 2-3 GM-%(50ML) IV SOLR
INTRAVENOUS | Status: AC
  Filled 2018-03-25: qty 50

## 2018-03-25 MED ORDER — LACTATED RINGERS IV SOLN
INTRAVENOUS | Status: DC
  Administered 2018-03-25: 08:00:00 via INTRAVENOUS

## 2018-03-25 MED ORDER — PROPOFOL 500 MG/50ML IV EMUL
INTRAVENOUS | Status: AC
  Filled 2018-03-25: qty 50

## 2018-03-25 MED ORDER — MIDAZOLAM HCL 2 MG/2ML IJ SOLN
INTRAMUSCULAR | Status: DC | PRN
  Administered 2018-03-25: 2 mg via INTRAVENOUS

## 2018-03-25 MED ORDER — IOTHALAMATE MEGLUMINE 43 % IV SOLN
INTRAVENOUS | Status: DC | PRN
  Administered 2018-03-25: 30 mL via URETHRAL

## 2018-03-25 MED ORDER — HYDROMORPHONE HCL 1 MG/ML IJ SOLN
0.5000 mg | INTRAMUSCULAR | Status: AC | PRN
  Administered 2018-03-25 (×2): 0.5 mg via INTRAVENOUS

## 2018-03-25 MED ORDER — PROPOFOL 10 MG/ML IV BOLUS
INTRAVENOUS | Status: DC | PRN
  Administered 2018-03-25: 50 mg via INTRAVENOUS
  Administered 2018-03-25: 200 mg via INTRAVENOUS

## 2018-03-25 MED ORDER — ONDANSETRON HCL 4 MG/2ML IJ SOLN
4.0000 mg | Freq: Once | INTRAMUSCULAR | Status: AC | PRN
  Administered 2018-03-25: 4 mg via INTRAVENOUS

## 2018-03-25 MED ORDER — DEXAMETHASONE SODIUM PHOSPHATE 10 MG/ML IJ SOLN
INTRAMUSCULAR | Status: DC | PRN
  Administered 2018-03-25: 10 mg via INTRAVENOUS

## 2018-03-25 MED ORDER — FENTANYL CITRATE (PF) 100 MCG/2ML IJ SOLN
INTRAMUSCULAR | Status: DC | PRN
  Administered 2018-03-25 (×2): 50 ug via INTRAVENOUS

## 2018-03-25 MED ORDER — HYDROMORPHONE HCL 1 MG/ML IJ SOLN
INTRAMUSCULAR | Status: AC
  Administered 2018-03-25: 0.5 mg via INTRAVENOUS
  Filled 2018-03-25: qty 1

## 2018-03-25 MED ORDER — ONDANSETRON HCL 4 MG/2ML IJ SOLN
INTRAMUSCULAR | Status: DC | PRN
  Administered 2018-03-25: 4 mg via INTRAVENOUS

## 2018-03-25 MED ORDER — LIDOCAINE HCL (PF) 2 % IJ SOLN
INTRAMUSCULAR | Status: AC
  Filled 2018-03-25: qty 10

## 2018-03-25 MED ORDER — FAMOTIDINE 20 MG PO TABS: 20.0000 mg | ORAL_TABLET | Freq: Once | ORAL | Status: DC

## 2018-03-25 MED ORDER — SCOPOLAMINE 1 MG/3DAYS TD PT72
1.0000 | MEDICATED_PATCH | Freq: Once | TRANSDERMAL | Status: DC
  Administered 2018-03-25: 1.5 mg via TRANSDERMAL

## 2018-03-25 MED ORDER — FENTANYL CITRATE (PF) 100 MCG/2ML IJ SOLN
25.0000 ug | INTRAMUSCULAR | Status: AC | PRN
  Administered 2018-03-25 (×6): 25 ug via INTRAVENOUS

## 2018-03-25 MED ORDER — FENTANYL CITRATE (PF) 100 MCG/2ML IJ SOLN
INTRAMUSCULAR | Status: AC
  Filled 2018-03-25: qty 2

## 2018-03-25 SURGICAL SUPPLY — 19 items
BAG DRAIN CYSTO-URO LG1000N (MISCELLANEOUS) ×3 IMPLANT
BRUSH SCRUB EZ  4% CHG (MISCELLANEOUS) ×1
BRUSH SCRUB EZ 4% CHG (MISCELLANEOUS) ×2 IMPLANT
CATH URETL 5X70 OPEN END (CATHETERS) ×3 IMPLANT
CONRAY 43 FOR UROLOGY 50M (MISCELLANEOUS) ×3 IMPLANT
DRAPE UTILITY 15X26 TOWEL STRL (DRAPES) ×3 IMPLANT
GLOVE BIO SURGEON STRL SZ 6.5 (GLOVE) ×3 IMPLANT
GOWN STRL REUS W/ TWL LRG LVL3 (GOWN DISPOSABLE) ×4 IMPLANT
GOWN STRL REUS W/TWL LRG LVL3 (GOWN DISPOSABLE) ×2
KIT TURNOVER CYSTO (KITS) ×3 IMPLANT
PACK CYSTO AR (MISCELLANEOUS) ×3 IMPLANT
SENSORWIRE 0.038 NOT ANGLED (WIRE) ×3
SET CYSTO W/LG BORE CLAMP LF (SET/KITS/TRAYS/PACK) ×3 IMPLANT
SOL .9 NS 3000ML IRR  AL (IV SOLUTION) ×1
SOL .9 NS 3000ML IRR UROMATIC (IV SOLUTION) ×2 IMPLANT
STENT URO INLAY 6FRX26CM (STENTS) ×3 IMPLANT
SURGILUBE 2OZ TUBE FLIPTOP (MISCELLANEOUS) ×3 IMPLANT
WATER STERILE IRR 1000ML POUR (IV SOLUTION) ×3 IMPLANT
WIRE SENSOR 0.038 NOT ANGLED (WIRE) ×2 IMPLANT

## 2018-03-25 NOTE — Anesthesia Postprocedure Evaluation (Signed)
Anesthesia Post Note  Patient: Patricia Rojas  Procedure(s) Performed: CYSTOSCOPY WITH STENT REPLACEMENT (Right Ureter) CYSTOSCOPY WITH RETROGRADE PYELOGRAM (Right Ureter)  Patient location during evaluation: PACU Anesthesia Type: General Level of consciousness: awake and alert Pain management: pain level controlled Vital Signs Assessment: post-procedure vital signs reviewed and stable Respiratory status: spontaneous breathing and respiratory function stable Cardiovascular status: stable Anesthetic complications: no     Last Vitals:  Vitals:   03/25/18 0753 03/25/18 1006  BP: 131/83 (!) 140/94  Pulse: 73 81  Resp: 18 14  Temp: 36.7 C 36.6 C  SpO2: 99% 100%    Last Pain:  Vitals:   03/25/18 1006  TempSrc:   PainSc: 8                  Dmoni Fortson K

## 2018-03-25 NOTE — Op Note (Signed)
Date of procedure: 03/25/18  Preoperative diagnosis:  1. Right ureteral stricture  Postoperative diagnosis:  1. Same as above  Procedure: 1. Right retrograde pyelogram 2. Right ureteral stent exchange  Surgeon: Hollice Espy, MD  Anesthesia: General  Complications: None  Intraoperative findings: Long at least 8.5 cm right distal ureteral stricture starting from near the level of the iliac vessels all the way down to the level of the bladder.  EBL: Minimal  Specimens: None  Drains: 6 x 26 French double-J ureteral stent, Bard Optima  Indication: Patricia Rojas is a 52 y.o. patient with history of cervical cancer status post radiation with right distal ureteral stricture previously managed with an indwelling nephrostomy tube more recently converted to an indwelling double-J tube..  After reviewing the management options for treatment, she elected to proceed with the above surgical procedure(s). We have discussed the potential benefits and risks of the procedure, side effects of the proposed treatment, the likelihood of the patient achieving the goals of the procedure, and any potential problems that might occur during the procedure or recuperation. Informed consent has been obtained.  Description of procedure:  The patient was taken to the operating room and general anesthesia was induced.  The patient was placed in the dorsal lithotomy position, prepped and draped in the usual sterile fashion, and preoperative antibiotics were administered. A preoperative time-out was performed.   A 21 French scope was advanced per urethra into the bladder.  The bladder was carefully inspected and noted to be free of any lesions, tumors, or stones.  There is a small amount of erythema adjacent to the coil of the stent consistent with irritation from the stent itself.  Attention was turned to the right ureteral orifice from which a ureteral stent was seen emanating.  The distal coil was grasped with a  stent grasper brought to level the urethral meatus.   This was then cannulated using a sensor wire to the level of the kidney and the stent was removed leaving the safety wire in place.  A 5 French open-ended ureteral catheter was then inserted just within the mouth of the UO and a retrograde pyelogram was performed.  This revealed a long narrow distal ureteral stricture extending from approximately the level of the iliac vessels all the way down to the bladder, at least 8.5 cm in length, diffuse and significant.  There was fairly significant abrupt caliber change at the level of the stricture.  Given the severity and length of the stricture, balloon dilation under endopyelotomy did not seem like a reasonable option thus not undertaken.  This point time, the stent was replaced by advancing a Bard Optima 6 x 26 French double-J ureteral stent was advanced over the wire up to level of the kidney.  The wires partially drawn until focal stone within the renal pelvis.  The wires and fully withdrawn a full coils noted sitting nicely within the bladder.  The bladder was then drained.  Plan: A follow-up with the patient to discuss her options.  Given the length of the ureteral stricture, would likely refer her to a tertiary care center for a ureteral reimplant which may necessitate mobilization of the kidney and possibly Bori flap.  Hollice Espy, M.D.

## 2018-03-25 NOTE — Transfer of Care (Signed)
Immediate Anesthesia Transfer of Care Note  Patient: Patricia Rojas  Procedure(s) Performed: CYSTOSCOPY WITH STENT REPLACEMENT (Right Ureter) CYSTOSCOPY WITH RETROGRADE PYELOGRAM (Right Ureter)  Patient Location: PACU  Anesthesia Type:General  Level of Consciousness: awake, alert  and oriented  Airway & Oxygen Therapy: Patient Spontanous Breathing and Patient connected to face mask oxygen  Post-op Assessment: Report given to RN and Post -op Vital signs reviewed and stable  Post vital signs: Reviewed and stable  Last Vitals:  Vitals Value Taken Time  BP    Temp    Pulse    Resp    SpO2      Last Pain:  Vitals:   03/25/18 0753  TempSrc: Tympanic  PainSc: 6       Patients Stated Pain Goal: 3 (09/02/29 0762)  Complications: No apparent anesthesia complications

## 2018-03-25 NOTE — Anesthesia Post-op Follow-up Note (Signed)
Anesthesia QCDR form completed.        

## 2018-03-25 NOTE — Anesthesia Preprocedure Evaluation (Signed)
Anesthesia Evaluation  Patient identified by MRN, date of birth, ID band  Reviewed: Allergy & Precautions, NPO status , Patient's Chart, lab work & pertinent test results  History of Anesthesia Complications (+) PONV and history of anesthetic complications  Airway Mallampati: II       Dental   Pulmonary neg sleep apnea, neg COPD, former smoker,           Cardiovascular hypertension, Pt. on medications (-) Past MI and (-) CHF (-) dysrhythmias (-) Valvular Problems/Murmurs     Neuro/Psych neg Seizures Anxiety Depression    GI/Hepatic Neg liver ROS, GERD  Medicated,  Endo/Other  neg diabetes  Renal/GU Renal disease     Musculoskeletal   Abdominal   Peds  Hematology   Anesthesia Other Findings   Reproductive/Obstetrics                             Anesthesia Physical Anesthesia Plan  ASA: III  Anesthesia Plan: General   Post-op Pain Management:    Induction:   PONV Risk Score and Plan: 3 and Ondansetron and Dexamethasone  Airway Management Planned: LMA  Additional Equipment:   Intra-op Plan:   Post-operative Plan:   Informed Consent: I have reviewed the patients History and Physical, chart, labs and discussed the procedure including the risks, benefits and alternatives for the proposed anesthesia with the patient or authorized representative who has indicated his/her understanding and acceptance.     Plan Discussed with:   Anesthesia Plan Comments:         Anesthesia Quick Evaluation

## 2018-03-25 NOTE — Interval H&P Note (Signed)
History and Physical Interval Note:  03/25/2018 9:08 AM  Patricia Rojas  has presented today for surgery, with the diagnosis of right ureteral stricture  The various methods of treatment have been discussed with the patient and family. After consideration of risks, benefits and other options for treatment, the patient has consented to  Procedure(s): CYSTOSCOPY WITH STENT REPLACEMENT (Right) CYSTOSCOPY WITH RETROGRADE PYELOGRAM (Right) BALLOON DILATION of right ureter (Right) HOLMIUM LASER APPLICATION for incision of ureter (Right) as a surgical intervention .  The patient's history has been reviewed, patient examined, no change in status, stable for surgery.  I have reviewed the patient's chart and labs.  Questions were answered to the patient's satisfaction.    RRR CTAB  Hollice Espy

## 2018-03-25 NOTE — Anesthesia Procedure Notes (Signed)
Procedure Name: LMA Insertion Performed by: Dmiya Malphrus, CRNA Pre-anesthesia Checklist: Patient identified, Patient being monitored, Timeout performed, Emergency Drugs available and Suction available Patient Re-evaluated:Patient Re-evaluated prior to induction Oxygen Delivery Method: Circle system utilized Preoxygenation: Pre-oxygenation with 100% oxygen Induction Type: IV induction Ventilation: Mask ventilation without difficulty LMA: LMA inserted LMA Size: 4.0 Tube type: Oral Number of attempts: 1 Placement Confirmation: positive ETCO2 and breath sounds checked- equal and bilateral Tube secured with: Tape Dental Injury: Teeth and Oropharynx as per pre-operative assessment        

## 2018-03-25 NOTE — Discharge Instructions (Signed)

## 2018-03-26 ENCOUNTER — Encounter: Payer: Self-pay | Admitting: Urology

## 2018-03-27 IMAGING — CT CT ABD-PELV W/O CM
2 of 4 series · 17 of 46 positions shown, 19 images · non-contrast
Comparison: 08/12/2017

CLINICAL DATA: Right lower quadrant pain.  Cervical carcinoma.

EXAM:
CT ABDOMEN AND PELVIS WITHOUT CONTRAST
TECHNIQUE: Multidetector CT imaging of the abdomen and pelvis was performed
following the standard protocol without IV contrast.

[Series 2: axial st · axial · 0.82mm/px · z∈[-906,-481]mm · 14 of 93 slices shown, 16 images]
[im 4/93  soft-tissue]
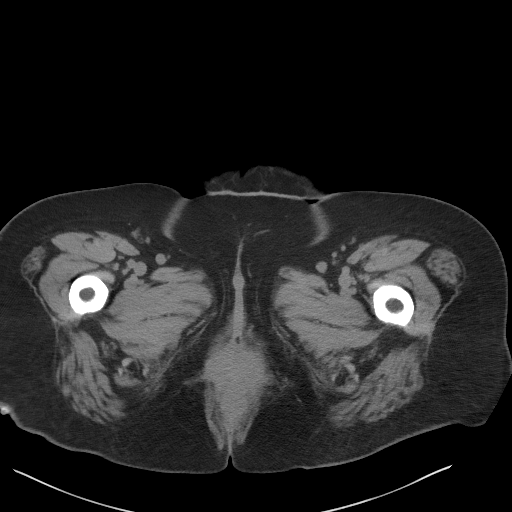
[im 4/93  bone]
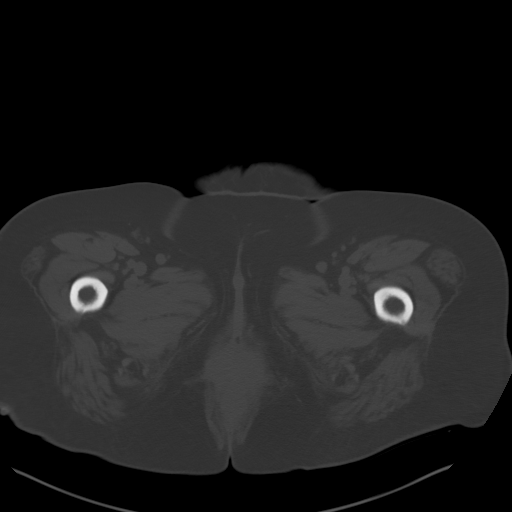
[im 12/93  soft-tissue]
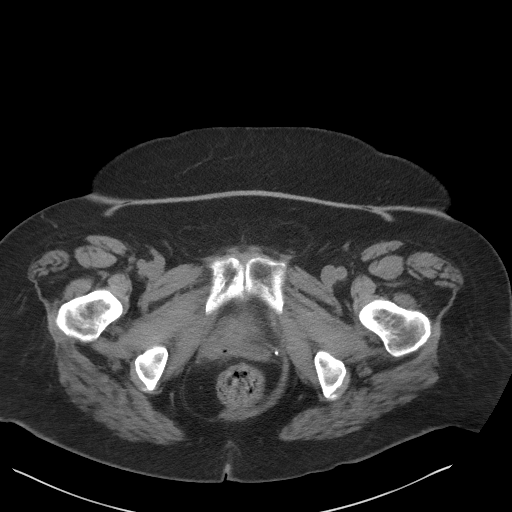
[im 20/93  soft-tissue]
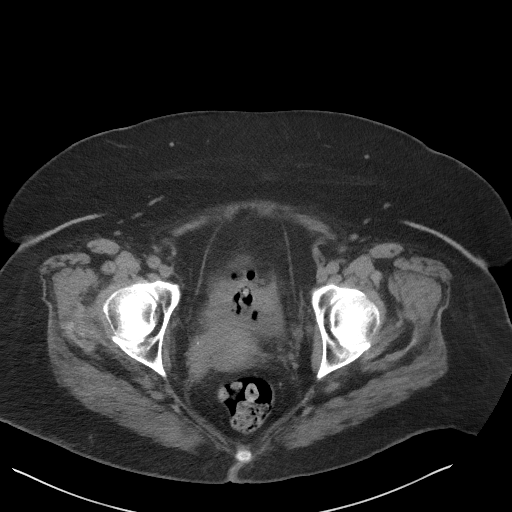
[im 24/93  soft-tissue]
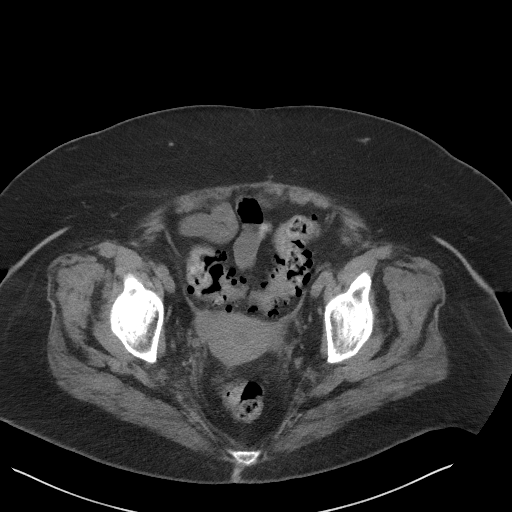
[im 31/93  soft-tissue]
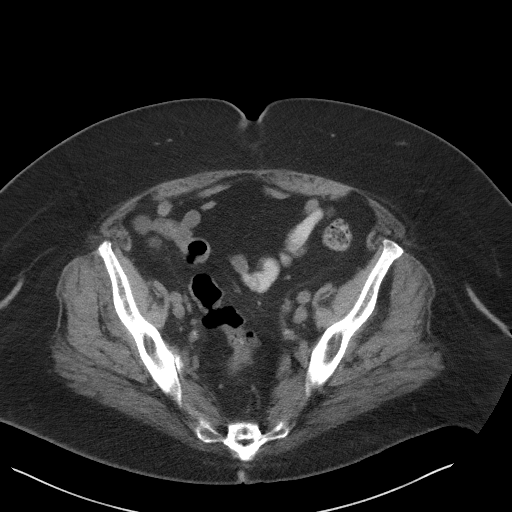
[im 39/93  soft-tissue]
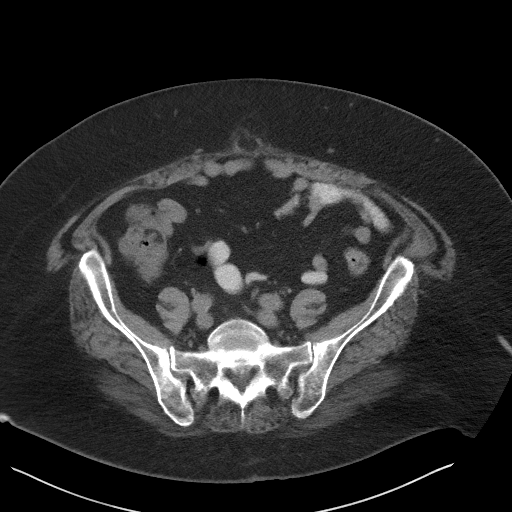
[im 43/93  soft-tissue]
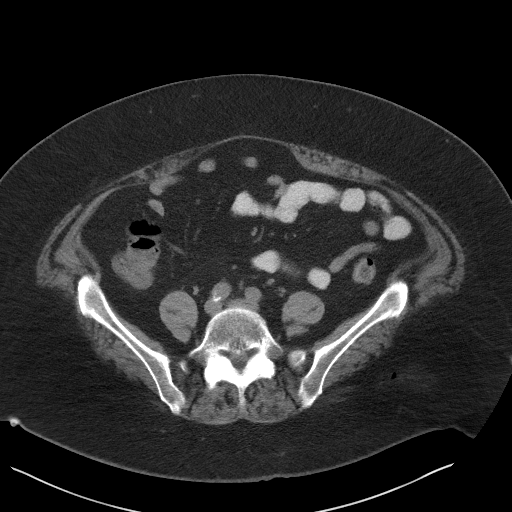
[im 50/93  soft-tissue]
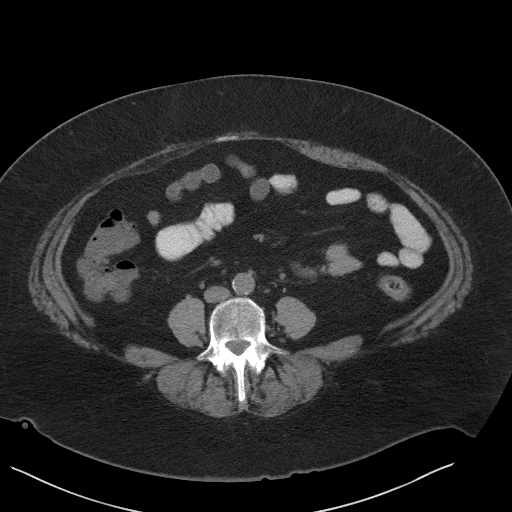
[im 54/93  soft-tissue]
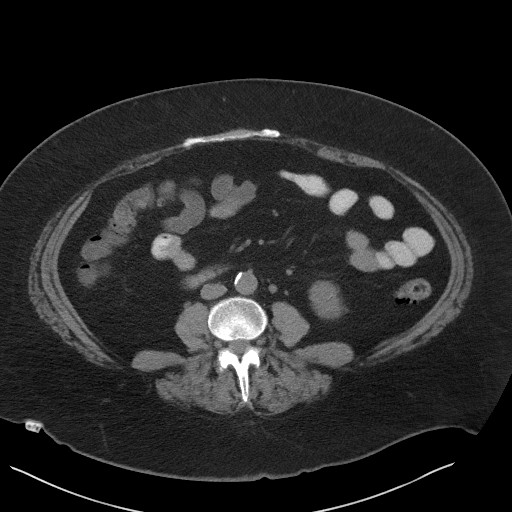
[im 54/93  bone]
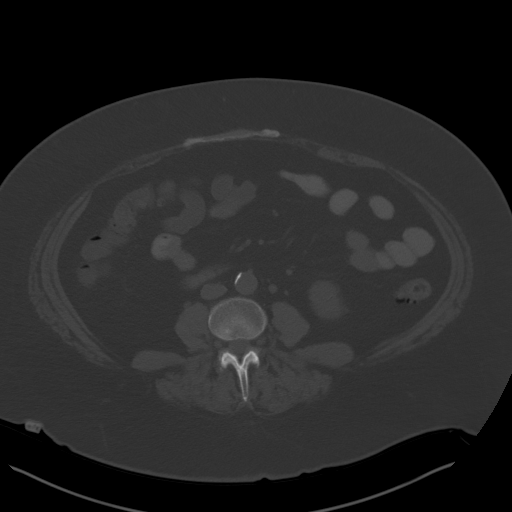
[im 62/93  soft-tissue]
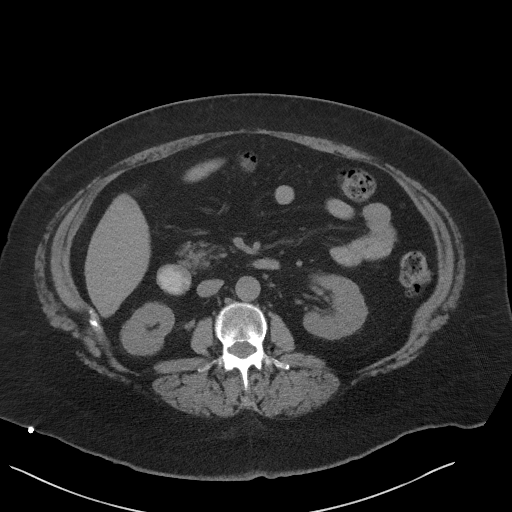
[im 70/93  soft-tissue]
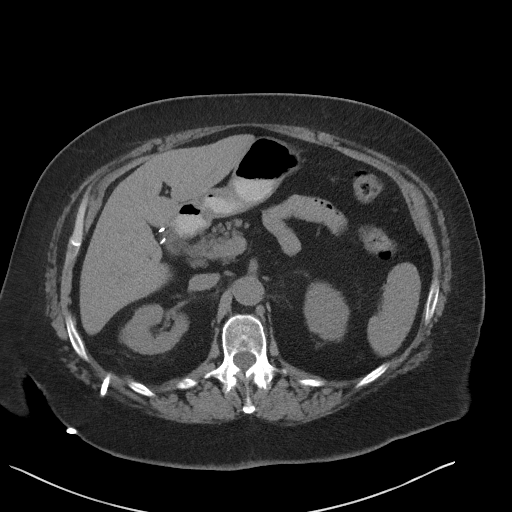
[im 73/93  soft-tissue]
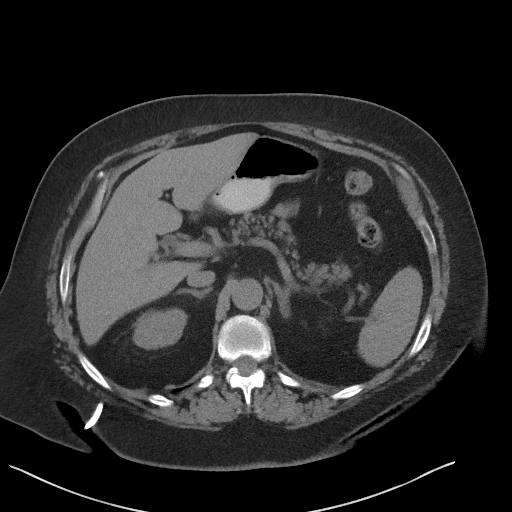
[im 81/93  soft-tissue]
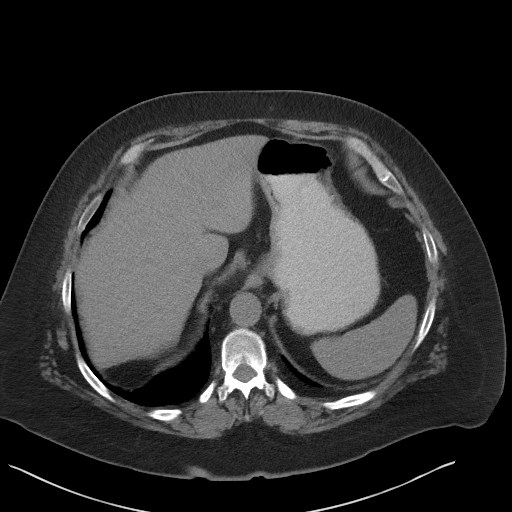
[im 89/93  soft-tissue]
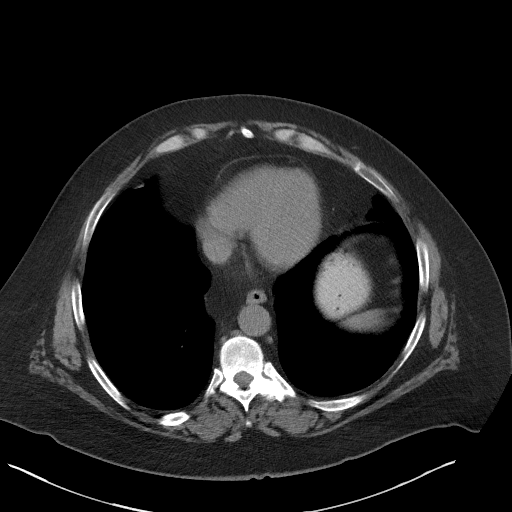

[Series 5: coronal st · coronal · 0.85mm/px · 3 of 111 slices shown]
[im 37/111  soft-tissue]
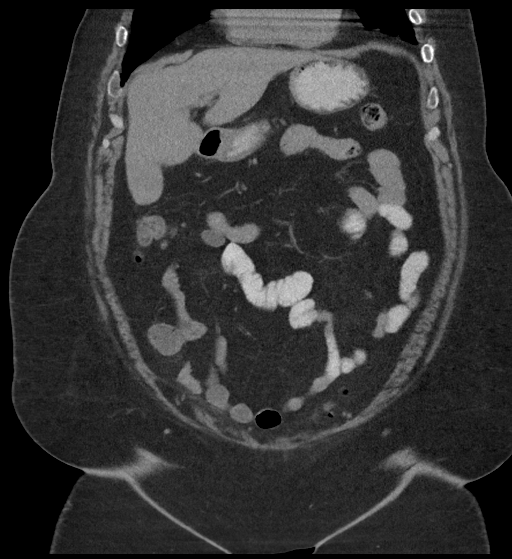
[im 49/111  soft-tissue]
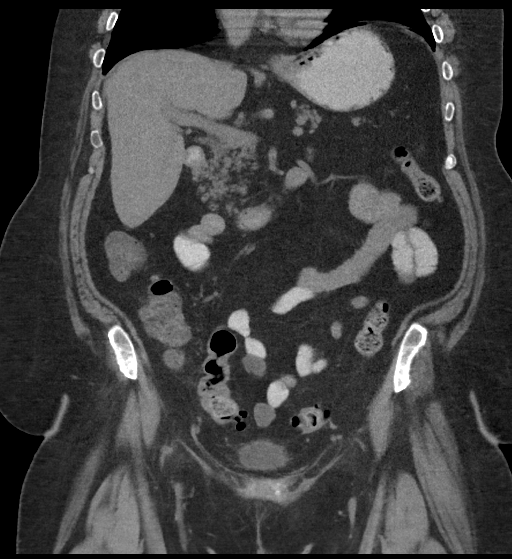
[im 62/111  soft-tissue]
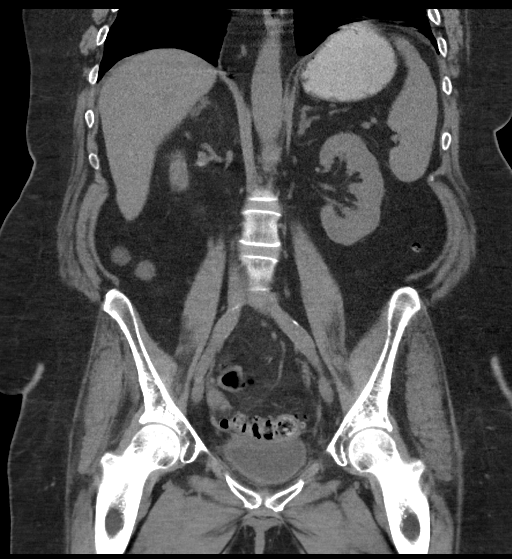

[17 of 46 positions shown; findings below may reference images not displayed]

FINDINGS: Lower chest: No acute findings. Prior cholecystectomy. No evidence
of biliary dilatation.

Hepatobiliary:  No mass visualized on this unenhanced exam.

Pancreas: No mass or inflammatory process visualized on this
unenhanced exam.

Spleen:  Within normal limits in size.

Adrenals/Urinary tract: Right-sided percutaneous nephrostomy tube
remains in appropriate position. No evidence of hydronephrosis.

Stomach/Bowel: No evidence of obstruction, inflammatory process, or
abnormal fluid collections. Diverticulosis mainly involving the
descending sigmoid colon, without evidence of diverticulitis.

Vascular/Lymphatic: No pathologically enlarged lymph nodes
identified. No evidence of abdominal aortic aneurysm. Aortic
atherosclerosis.

Reproductive: Stable ill-defined cervical mass extending into right
parametrial soft tissues. No other pelvic or adnexal mass
identified. No evidence of free fluid.

Other:  Stable small paraumbilical hernia containing only fat.

Musculoskeletal:  No suspicious bone lesions identified.
IMPRESSION: No acute findings.

Stable ill-defined cervical mass extending into right parametrial
soft tissues. No new or progressive disease identified.

Colonic diverticulosis, without radiographic evidence of
diverticulitis.

## 2018-04-05 ENCOUNTER — Other Ambulatory Visit: Payer: Self-pay | Admitting: *Deleted

## 2018-04-05 MED ORDER — HYDROCODONE-ACETAMINOPHEN 5-325 MG PO TABS: 1.0000 | ORAL_TABLET | Freq: Four times a day (QID) | ORAL | 0 refills | Status: DC | PRN

## 2018-04-05 MED ORDER — PROMETHAZINE HCL 25 MG PO TABS: 25.0000 mg | ORAL_TABLET | Freq: Four times a day (QID) | ORAL | 1 refills | Status: DC | PRN

## 2018-04-09 ENCOUNTER — Encounter: Payer: Self-pay | Admitting: Urology

## 2018-04-09 ENCOUNTER — Ambulatory Visit (INDEPENDENT_AMBULATORY_CARE_PROVIDER_SITE_OTHER): Payer: Medicaid Other | Admitting: Urology

## 2018-04-09 VITALS — BP 131/82 | HR 77 | Ht 65.0 in | Wt 240.0 lb

## 2018-04-09 DIAGNOSIS — N135 Crossing vessel and stricture of ureter without hydronephrosis: Secondary | ICD-10-CM | POA: Diagnosis not present

## 2018-04-09 NOTE — Progress Notes (Signed)
04/09/2018 9:27 AM   Patricia Rojas June 25, 1966 [id number]  Referring provider: [name], MD [address] [place], [place] [address]  Chief Complaint  Patient presents with  . Ureteral Obstruction    follow up    HPI: 52 year old female with significant right distal ureteral stricture status post ureteral stent exchange, retrograde pyelogram he returns today to discuss definitive management of her right distal ureteral stricture.  She recently taken to the operating room on 03/25/2018 for placement of a Bard Optima right ureteral stent and retrograde pyelogram.  Unfortunately, she has a very tight long right distal ureteral stricture measuring at least 8.5 cm, see pictures in media.  She does have some difficulty tolerating the stent.  She has urgency frequency as well as flank pain associated with this.  She does take narcotics on a regular basis.  She will be due for another PET scan at [facility] next week.  She has managed by Dr. Fransisca Connors.  Recent Tru-Cut biopsies showed no evidence of residual disease.  She has had some low-grade metabolic activity on her previous PET scans.  Please see my previous notes for details of her medical history.  She is a history of stage IIIb cervical cancer with resulting right hydronephrosis managed with a right PCN tube.  More recently, she went internalization with a right double-J stent.  Her creatinine remains elevated1.23 as of 3/8/19from her previous baseline of 0.68prior to her dx.  PMH: Past Medical History:  Diagnosis Date  . Anxiety   . Cancer (HCC)    cervical  . Constipation   . Depression   . GERD (gastroesophageal reflux disease)   . Hemorrhoids   . Hypertension   . N&V (nausea and vomiting)   . PONV (postoperative nausea and vomiting)    PT RECEIVED SCOPLAMINE PATCH FOR 01-09-18 SURGERY AND SHE HAD NO N/V    Surgical History: Past Surgical History:  Procedure Laterality Date  . CESAREAN SECTION  1986  .  CHOLECYSTECTOMY    . CYSTOSCOPY W/ RETROGRADES Right 03/25/2018   Procedure: CYSTOSCOPY WITH RETROGRADE PYELOGRAM;  Surgeon: Hollice Espy, MD;  Location: [facility] ORS;  Service: Urology;  Laterality: Right;  . CYSTOSCOPY W/ URETERAL STENT PLACEMENT Right 03/25/2018   Procedure: CYSTOSCOPY WITH STENT REPLACEMENT;  Surgeon: Hollice Espy, MD;  Location: [facility] ORS;  Service: Urology;  Laterality: Right;  . HERNIA REPAIR    . IR GENERIC HISTORICAL  02/23/2017   IR NEPHROSTOMY PLACEMENT RIGHT 02/23/2017 [name], MD [facility]INTER[facility] RAD  . IR NEPHROSTOGRAM RIGHT THRU EXISTING ACCESS  02/15/2018  . IR NEPHROSTOMY EXCHANGE RIGHT  04/18/2017  . IR NEPHROSTOMY EXCHANGE RIGHT  06/08/2017  . IR NEPHROSTOMY EXCHANGE RIGHT  08/17/2017  . IR NEPHROSTOMY EXCHANGE RIGHT  10/05/2017  . IR NEPHROSTOMY EXCHANGE RIGHT  12/28/2017  . IR URETERAL STENT PLACEMENT EXISTING ACCESS RIGHT  02/15/2018  . VULVA /PERINEUM BIOPSY N/A 01/09/2018   Procedure: VAGINAL  AND CERVIX BIOPSY;  Surgeon: [name], MD;  Location: [facility] ORS;  Service: Gynecology;  Laterality: N/A;    Home Medications:  Allergies as of 04/09/2018   No Known Allergies     Medication List        Accurate as of 04/09/18  9:27 AM. Always use your most recent med list.          acetaminophen 500 MG tablet Commonly known as:  TYLENOL Take 1,000 mg by mouth daily as needed for moderate pain.   ALPRAZolam 0.25 MG tablet Commonly known  as:  XANAX Take 1-2 tabs daily as needed   amLODipine 2.5 MG tablet Commonly known as:  NORVASC Take 1 tablet (2.5 mg total) by mouth daily.   calcium carbonate 500 MG chewable tablet Commonly known as:  TUMS - dosed in mg elemental calcium Chew 2 tablets by mouth daily as needed for indigestion or heartburn.   HYDROcodone-acetaminophen 5-325 MG tablet Commonly known as:  NORCO/VICODIN Take 1 tablet by mouth every 6 (six) hours as needed for moderate pain.   lactulose 10 GM/15ML solution Commonly known as:   CHRONULAC Take 45 mLs (30 g total) by mouth 2 (two) times daily as needed for mild constipation.   ondansetron 8 MG tablet Commonly known as:  ZOFRAN Take 8 mg by mouth 2 (two) times daily as needed for nausea or vomiting.   oxybutynin 5 MG tablet Commonly known as:  DITROPAN Take 1 tablet (5 mg total) by mouth every 8 (eight) hours as needed for bladder spasms.   prochlorperazine 10 MG tablet Commonly known as:  COMPAZINE Take 1 tablet (10 mg total) by mouth every 6 (six) hours as needed for nausea or vomiting.   promethazine 25 MG tablet Commonly known as:  PHENERGAN Take 1 tablet (25 mg total) by mouth every 6 (six) hours as needed for nausea or vomiting.   senna-docusate 8.6-50 MG tablet Commonly known as:  Senokot-S Take 1 tablet by mouth daily.       Allergies: No Known Allergies  Family History: Family History  Problem Relation Age of Onset  . Cancer Maternal Uncle   . Cancer Paternal Aunt   . Kidney cancer Neg Hx   . Bladder Cancer Neg Hx     Social History:  reports that she quit smoking about 29 years ago. Her smoking use included cigarettes. She has never used smokeless tobacco. She reports that she does not drink alcohol or use drugs.  ROS: UROLOGY Frequent Urination?: Yes Hard to postpone urination?: Yes Burning/pain with urination?: No Get up at night to urinate?: Yes Leakage of urine?: Yes Urine stream starts and stops?: Yes Trouble starting stream?: No Do you have to strain to urinate?: No Blood in urine?: No Urinary tract infection?: No Sexually transmitted disease?: No Injury to kidneys or bladder?: No Painful intercourse?: No Weak stream?: No Currently pregnant?: No Vaginal bleeding?: No Last menstrual period?: n  Gastrointestinal Nausea?: No Vomiting?: No Indigestion/heartburn?: No Diarrhea?: No Constipation?: No  Constitutional Fever: No Night sweats?: No Weight loss?: No Fatigue?: No  Skin Skin rash/lesions?: No Itching?:  No  Eyes Blurred vision?: No Double vision?: No  Ears/Nose/Throat Sore throat?: No Sinus problems?: No  Hematologic/Lymphatic Swollen glands?: No Easy bruising?: No  Cardiovascular Leg swelling?: No Chest pain?: No  Respiratory Cough?: No Shortness of breath?: No  Endocrine Excessive thirst?: No  Musculoskeletal Back pain?: Yes Joint pain?: No  Neurological Headaches?: No Dizziness?: No  Psychologic Depression?: No Anxiety?: No  Physical Exam: BP 131/82   Pulse 77   Ht 5\' 5"  (1.651 m)   Wt 240 lb (108.9 kg)   LMP 02/28/2017 (Approximate)   BMI 39.94 kg/m   Constitutional:  Alert and oriented, No acute distress.   HEENT: [place] AT, moist mucus membranes.  Trachea midline, no masses. Cardiovascular: No clubbing, cyanosis, or edema. Respiratory: Normal respiratory effort, no increased work of breathing. Abd: Obese.   Skin: No rashes, bruises or suspicious lesions. Neurologic: Grossly intact, no focal deficits, moving all 4 extremities. Psychiatric: Emotional, tearful at times as  on most other occasions.  Laboratory Data: Lab Results  Component Value Date   WBC 4.8 02/15/2018   HGB 11.7 (L) 02/15/2018   HCT 35.4 02/15/2018   MCV 82.9 02/15/2018   PLT 235 02/15/2018    Lab Results  Component Value Date   CREATININE 1.23 (H) 02/15/2018    Urinalysis N/a  Pertinent Imaging: Retrograde pyelogram photographs were reviewed today personally with the patient.  Please see these under media.  Assessment & Plan:    1. Ureteral stricture Long distal right ureteral stricture, at least 8.5 cm based on retrograde pyelogram currently managed with indwelling ureteral stent (Bard Optima which can remain in place for up to 6 months) Given the complexity of her case, length of her ureteral stricture, and history of irradiated tissue, I feel that she would be best served by definitive reconstruction at a tertiary care center such as [facility] where she is receiving care  for her cervical cancer Case was discussed with Dr. Fransisca Connors is agreeable this plan Will refer to [facility] urology for further evaluation We did briefly discuss today options for definitive management of her stricture which will likely include ureteral reimplant possibly with Boari flap given the length of her disease ureter  She will follow-up as needed or contact the office if she does not hear from [facility] in the near future  - Ambulatory referral to Urology  Hollice Espy, MD  [facility] [address] [place], [place] [address] [phone]

## 2018-04-11 ENCOUNTER — Ambulatory Visit: Payer: Medicaid Other | Admitting: Urology

## 2018-04-16 ENCOUNTER — Ambulatory Visit: Payer: Medicaid Other

## 2018-04-19 ENCOUNTER — Other Ambulatory Visit: Payer: Self-pay | Admitting: *Deleted

## 2018-04-19 MED ORDER — HYDROCODONE-ACETAMINOPHEN 5-325 MG PO TABS: 1.0000 | ORAL_TABLET | Freq: Four times a day (QID) | ORAL | 0 refills | Status: DC | PRN

## 2018-04-19 NOTE — Telephone Encounter (Signed)
Please send refill for hydrocodone to patient's pharmacy and let patient know when complete. [phone]

## 2018-04-19 NOTE — Telephone Encounter (Signed)
Patient called requesting refill of hydrocodone.

## 2018-04-20 ENCOUNTER — Other Ambulatory Visit: Payer: Self-pay | Admitting: Oncology

## 2018-04-20 DIAGNOSIS — C539 Malignant neoplasm of cervix uteri, unspecified: Secondary | ICD-10-CM

## 2018-04-22 ENCOUNTER — Ambulatory Visit: Payer: Medicaid Other | Admitting: Oncology

## 2018-04-22 ENCOUNTER — Other Ambulatory Visit: Payer: Medicaid Other

## 2018-04-26 ENCOUNTER — Ambulatory Visit: Admission: RE | Admit: 2018-04-26 | Payer: Medicaid Other | Source: Ambulatory Visit

## 2018-04-29 ENCOUNTER — Inpatient Hospital Stay: Payer: Medicaid Other | Admitting: Oncology

## 2018-05-02 ENCOUNTER — Other Ambulatory Visit: Payer: Self-pay | Admitting: *Deleted

## 2018-05-02 MED ORDER — PROCHLORPERAZINE MALEATE 10 MG PO TABS: 10.0000 mg | ORAL_TABLET | Freq: Four times a day (QID) | ORAL | 1 refills | Status: DC | PRN

## 2018-05-02 MED ORDER — HYDROCODONE-ACETAMINOPHEN 5-325 MG PO TABS: 1.0000 | ORAL_TABLET | Freq: Four times a day (QID) | ORAL | 0 refills | Status: DC | PRN

## 2018-05-13 ENCOUNTER — Ambulatory Visit
Admission: RE | Admit: 2018-05-13 | Discharge: 2018-05-13 | Disposition: A | Discharge status: Home / Self Care | Payer: Medicaid Other | Source: Ambulatory Visit | Attending: Oncology | Admitting: Oncology

## 2018-05-13 DIAGNOSIS — I700 Atherosclerosis of aorta: Secondary | ICD-10-CM | POA: Insufficient documentation

## 2018-05-13 DIAGNOSIS — C539 Malignant neoplasm of cervix uteri, unspecified: Secondary | ICD-10-CM | POA: Diagnosis present

## 2018-05-13 MED ORDER — IOPAMIDOL (ISOVUE-300) INJECTION 61%
100.0000 mL | Freq: Once | INTRAVENOUS | Status: AC | PRN
  Administered 2018-05-13: 100 mL via INTRAVENOUS

## 2018-05-15 DIAGNOSIS — N135 Crossing vessel and stricture of ureter without hydronephrosis: Secondary | ICD-10-CM | POA: Insufficient documentation

## 2018-05-16 ENCOUNTER — Telehealth: Payer: Self-pay | Admitting: Urology

## 2018-05-16 ENCOUNTER — Other Ambulatory Visit: Payer: Self-pay | Admitting: *Deleted

## 2018-05-16 DIAGNOSIS — N1330 Unspecified hydronephrosis: Secondary | ICD-10-CM | POA: Insufficient documentation

## 2018-05-16 MED ORDER — HYDROCODONE-ACETAMINOPHEN 5-325 MG PO TABS: 1.0000 | ORAL_TABLET | Freq: Four times a day (QID) | ORAL | 0 refills | Status: DC | PRN

## 2018-05-16 NOTE — Telephone Encounter (Signed)
I do not see any documentation from her notes a do that this is the plan.  Once I hear from them or see this documented, I can certainly help arrange this if that is what they want.  I do see that she needs pictures of her retrograde pyelogram from the operating room.  This is scanned in the chart under media.  Please either get these photographs to the patient or fax them to [facility] for review.  Hollice Espy, MD

## 2018-05-16 NOTE — Telephone Encounter (Signed)
Patient is calling and stating that the doctor at [facility] is telling her that she will need the stent taking out and she will need another nephrostomy tube placed 6 weeks prior to her having surgery? Can you call her and find out what she needs and she wants to know if we can do this so that she doesn't have to go to [facility] to have this done? Please call the patient at [phone]. I told her that this may need to be done their since we have referred her out to [facility] and this is what they are asking for but I didn't know and it would have to be decided by you.   Sharyn Lull

## 2018-05-16 NOTE — Telephone Encounter (Signed)
I faxed the picture's to [facility] and called the patient and advised her that they were going to review her case and if there was something that we needed to to they would let us know.   Thanks,  Sharyn Lull

## 2018-05-18 NOTE — Progress Notes (Signed)
[facility]  Telephone:(336) [phone] Fax:(336) [phone]  ID: Patricia Rojas OB: 1966-08-16  MR#: [id number]  IDP#:[id number]  Patient Care Team: [name], MD as PCP - General (Infectious Diseases) Clent Jacks, RN as Registered Nurse  CHIEF COMPLAINT: Squamous cell carcinoma of the cervix.  INTERVAL HISTORY: Patient returns to clinic today for further evaluation and discussion of her CT scan results.  She continues to have pain, but states it is much better controlled.  She currently feels well and is at her baseline. She has no neurologic complaints. She denies any recent fevers or illnesses. She has no chest pain or shortness of breath.  She denies any nausea, vomiting, constipation, or diarrhea.  She has no urinary complaints.  Patient offers no further specific complaints today.  REVIEW OF SYSTEMS:   Review of Systems  Constitutional: Negative.  Negative for fever, malaise/fatigue and weight loss.  Respiratory: Negative.  Negative for cough and shortness of breath.   Cardiovascular: Negative.  Negative for chest pain and leg swelling.  Gastrointestinal: Negative.  Negative for abdominal pain, constipation, diarrhea and nausea.  Genitourinary: Negative.  Negative for flank pain.  Musculoskeletal: Negative.   Skin: Negative.  Negative for rash.  Neurological: Negative.  Negative for sensory change, focal weakness and weakness.  Psychiatric/Behavioral: Negative for depression. The patient is nervous/anxious.     As per HPI. Otherwise, a complete review of systems is negative.  PAST MEDICAL HISTORY: Past Medical History:  Diagnosis Date  . Anxiety   . Cancer (HCC)    cervical  . Constipation   . Depression   . GERD (gastroesophageal reflux disease)   . Hemorrhoids   . Hypertension   . N&V (nausea and vomiting)   . PONV (postoperative nausea and vomiting)    PT RECEIVED SCOPLAMINE PATCH FOR 01-09-18 SURGERY AND SHE HAD NO N/V    PAST SURGICAL  HISTORY: Past Surgical History:  Procedure Laterality Date  . CESAREAN SECTION  1986  . CHOLECYSTECTOMY    . CYSTOSCOPY W/ RETROGRADES Right 03/25/2018   Procedure: CYSTOSCOPY WITH RETROGRADE PYELOGRAM;  Surgeon: Hollice Espy, MD;  Location: [facility] ORS;  Service: Urology;  Laterality: Right;  . CYSTOSCOPY W/ URETERAL STENT PLACEMENT Right 03/25/2018   Procedure: CYSTOSCOPY WITH STENT REPLACEMENT;  Surgeon: Hollice Espy, MD;  Location: [facility] ORS;  Service: Urology;  Laterality: Right;  . HERNIA REPAIR    . IR GENERIC HISTORICAL  02/23/2017   IR NEPHROSTOMY PLACEMENT RIGHT 02/23/2017 [name], MD [facility]INTER[facility]  . IR NEPHROSTOGRAM RIGHT THRU EXISTING ACCESS  02/15/2018  . IR NEPHROSTOMY EXCHANGE RIGHT  04/18/2017  . IR NEPHROSTOMY EXCHANGE RIGHT  06/08/2017  . IR NEPHROSTOMY EXCHANGE RIGHT  08/17/2017  . IR NEPHROSTOMY EXCHANGE RIGHT  10/05/2017  . IR NEPHROSTOMY EXCHANGE RIGHT  12/28/2017  . IR URETERAL STENT PLACEMENT EXISTING ACCESS RIGHT  02/15/2018  . VULVA /PERINEUM BIOPSY N/A 01/09/2018   Procedure: VAGINAL  AND CERVIX BIOPSY;  Surgeon: [name], MD;  Location: [facility] ORS;  Service: Gynecology;  Laterality: N/A;    FAMILY HISTORY: Family History  Problem Relation Age of Onset  . Cancer Maternal Uncle   . Cancer Paternal Aunt   . Kidney cancer Neg Hx   . Bladder Cancer Neg Hx     ADVANCED DIRECTIVES (Y/N):  N  HEALTH MAINTENANCE: Social History   Tobacco Use  . Smoking status: Former Smoker    Types: Cigarettes    Last attempt to quit: 1990    Years  since quitting: 29.4  . Smokeless tobacco: Never Used  Substance Use Topics  . Alcohol use: No  . Drug use: No    Comment: stopped 3 weeks ago     Colonoscopy:  PAP:  Bone density:  Lipid panel:  No Known Allergies  Current Outpatient Medications  Medication Sig Dispense Refill  . acetaminophen (TYLENOL) 500 MG tablet Take 1,000 mg by mouth daily as needed for moderate pain.    Marland Kitchen ALPRAZolam (XANAX) 0.25 MG  tablet Take 1-2 tabs daily as needed 60 tablet 0  . amLODipine (NORVASC) 2.5 MG tablet Take 1 tablet (2.5 mg total) by mouth daily. (Patient taking differently: Take 2.5 mg by mouth every Rojas. ) 30 tablet 0  . calcium carbonate (TUMS - DOSED IN MG ELEMENTAL CALCIUM) 500 MG chewable tablet Chew 2 tablets by mouth daily as needed for indigestion or heartburn.    Marland Kitchen HYDROcodone-acetaminophen (NORCO/VICODIN) 5-325 MG tablet Take 1 tablet by mouth every 6 (six) hours as needed for moderate pain. 60 tablet 0  . lactulose (CHRONULAC) 10 GM/15ML solution Take 45 mLs (30 g total) by mouth 2 (two) times daily as needed for mild constipation. 240 mL 0  . ondansetron (ZOFRAN) 8 MG tablet Take 8 mg by mouth 2 (two) times daily as needed for nausea or vomiting.    . prochlorperazine (COMPAZINE) 10 MG tablet Take 1 tablet (10 mg total) by mouth every 6 (six) hours as needed for nausea or vomiting. 30 tablet 1  . promethazine (PHENERGAN) 25 MG tablet Take 1 tablet (25 mg total) by mouth every 6 (six) hours as needed for nausea or vomiting. 30 tablet 1  . senna-docusate (SENOKOT-S) 8.6-50 MG tablet Take 1 tablet by mouth daily.    . solifenacin (VESICARE) 10 MG tablet Take by mouth.    . morphine (MS CONTIN) 30 MG 12 hr tablet Take 1 tablet (30 mg total) by mouth every 12 (twelve) hours. 60 tablet 0  . oxybutynin (DITROPAN) 5 MG tablet Take 1 tablet (5 mg total) by mouth every 8 (eight) hours as needed for bladder spasms. (Patient not taking: Reported on 05/21/2018) 30 tablet 1   No current facility-administered medications for this visit.     OBJECTIVE: Vitals:   05/21/18 0926  BP: 134/87  Temp: 97.8 F (36.6 C)     Body mass index is 42.22 kg/m.    ECOG FS:1 - Symptomatic but completely ambulatory  General: Well-developed, well-nourished, no acute distress. Eyes: Pink conjunctiva, anicteric sclera. Lungs: Clear to auscultation bilaterally. Heart: Regular rate and rhythm. No rubs, murmurs, or  gallops. Abdomen: Soft, nontender, nondistended. No organomegaly noted, normoactive bowel sounds. GU: Exam recently performed by gynecology oncology. Musculoskeletal: No edema, cyanosis, or clubbing. Neuro: Alert, answering all questions appropriately. Cranial nerves grossly intact. Skin: No rashes or petechiae noted. Psych: Normal affect.  LAB RESULTS:  Lab Results  Component Value Date   NA 139 02/15/2018   K 4.2 02/15/2018   CL 108 02/15/2018   CO2 22 02/15/2018   GLUCOSE 92 02/15/2018   BUN 19 02/15/2018   CREATININE 1.23 (H) 02/15/2018   CALCIUM 9.6 02/15/2018   PROT 9.2 (H) 09/10/2017   ALBUMIN 3.8 09/10/2017   AST 17 09/10/2017   ALT 12 (L) 09/10/2017   ALKPHOS 92 09/10/2017   BILITOT 0.5 09/10/2017   GFRNONAA 50 (L) 02/15/2018   GFRAA 58 (L) 02/15/2018    Lab Results  Component Value Date   WBC 4.8 02/15/2018   NEUTROABS 6.5  01/02/2018   HGB 11.7 (L) 02/15/2018   HCT 35.4 02/15/2018   MCV 82.9 02/15/2018   PLT 235 02/15/2018     STUDIES: Ct Abdomen Pelvis W Contrast  Result Date: 05/13/2018 CLINICAL DATA:  History of cervical cancer with distal right ureteral obstruction status post nephrostomy tube placement. EXAM: CT ABDOMEN AND PELVIS WITH CONTRAST TECHNIQUE: Multidetector CT imaging of the abdomen and pelvis was performed using the standard protocol following bolus administration of intravenous contrast. CONTRAST:  112m ISOVUE-300 IOPAMIDOL (ISOVUE-300) INJECTION 61% COMPARISON:  09/10/2017 FINDINGS: Lower chest: No acute abnormality. Hepatobiliary: No focal liver abnormality is seen. Status post cholecystectomy. No biliary dilatation. Pancreas: Unremarkable. No pancreatic ductal dilatation or surrounding inflammatory changes. Spleen: Normal in size without focal abnormality. Adrenals/Urinary Tract: Normal appearance of the adrenal glands. Right-sided nephroureteral stent is in place. No hydronephrosis identified. Mild bilateral renal cortical scarring  identified. The distal end of the right ureteral stent is in the urinary bladder. No focal bladder abnormality noted. Stomach/Bowel: The stomach is normal. The small bowel loops have a normal course and caliber. The appendix is visualized and appears normal. No pathologic dilatation of the colon. Distal colonic diverticulosis noted without acute inflammation. Vascular/Lymphatic: Aortic atherosclerosis noted. No aneurysm. No abdominal adenopathy. Reproductive: There has been interval decrease in size of patient's known cervical mass which is eccentric to the right measuring 3.2 x 2.2 by 2.1 cm (volume = 7.7 cm^3), image 71/2. Previously this measured 3.6 by 2.9 by 4.8 cm (volume = 26 cm^3). A portion of this lesion continues to involve the distal right ureter, image 116/4. Diffusely heterogeneous appearance of the uterus which has decreased in volume from the previous exam, image 114/4. Currently this measures 3.7 x 4.3 by 5.0 cm (volume = 42 cm^3). Previously 4.4 x 5.7 by 5.6 cm (volume = 74 cm^3). No adnexal mass identified. Other: No free fluid or fluid collections within the abdomen or pelvis. Musculoskeletal: No acute or significant osseous findings. IMPRESSION: 1. Interval decrease in size of cervical mass compared with 06/27/2017. There is continued soft tissue component involving the distal right ureter. 2. Interval decrease in size of uterus which has a diffusely heterogeneous enhancement pattern, new from previous exam. Etiology is indeterminate. Findings may reflect changes secondary to external beam radiation. 3. No mass or adenopathy identified to suggest metastatic disease. 4.  Aortic Atherosclerosis (ICD10-I70.0). Electronically Signed   By: TKerby MoorsM.D.   On: 05/13/2018 11:30    ASSESSMENT: Squamous cell carcinoma of the cervix, PDL-1 13%.  PLAN:    1. Squamous cell carcinoma of the cervix: Patient completed concurrent chemotherapy and XRT in April 2018. [facility].  [facility] with concern of local recurrence of disease, but surgical biopsy did not reveal any evidence of malignancy.  CT scan results from May 13, 2018 reviewed continued decrease of size of known cervical mass.  No intervention is needed at this time.  Return to clinic in 3 months with repeat imaging and further evaluation. 2.  Ureteral stricture: [facility]  Continue follow-up as indicated.  Imaging and follow-up as above may change based on her surgical date. 3.  Pain: Continue oxycodone as needed.  Patient expressed understanding that [facility] with [facility] pain she will be referred to pain clinic. 4. Anxiety: Continue Xanax as needed.  5. Renal insufficiency: Right  ureteral stent in place.  Possible surgery as above.  Approximately 30 minutes was spent in discussion of which greater than 50% was consultation.   Patient expressed understanding and was in agreement with this plan. She also understands that She can call clinic at any time with any questions, concerns, or complaints.   Cancer Staging Malignant neoplasm of cervix [facility]) Staging form: Corpus Uteri - Carcinoma and Carcinosarcoma, AJCC 8th Edition - Clinical stage from 02/18/2017: FIGO Stage IIIC1 (cT3b, cN1, cM0) - Signed by Lloyd Huger, MD on 02/18/2017   Lloyd Huger, MD   05/23/2018 10:08 PM

## 2018-05-21 ENCOUNTER — Other Ambulatory Visit: Payer: Self-pay

## 2018-05-21 ENCOUNTER — Inpatient Hospital Stay: Payer: Medicaid Other | Attending: Oncology | Admitting: Oncology

## 2018-05-21 ENCOUNTER — Encounter: Payer: Self-pay | Admitting: Oncology

## 2018-05-21 VITALS — BP 134/87 | Temp 97.8°F | Wt 253.7 lb

## 2018-05-21 DIAGNOSIS — C539 Malignant neoplasm of cervix uteri, unspecified: Secondary | ICD-10-CM | POA: Insufficient documentation

## 2018-05-21 DIAGNOSIS — R1031 Right lower quadrant pain: Secondary | ICD-10-CM | POA: Diagnosis not present

## 2018-05-21 DIAGNOSIS — F419 Anxiety disorder, unspecified: Secondary | ICD-10-CM | POA: Diagnosis not present

## 2018-05-21 DIAGNOSIS — Z87891 Personal history of nicotine dependence: Secondary | ICD-10-CM | POA: Insufficient documentation

## 2018-05-21 DIAGNOSIS — N289 Disorder of kidney and ureter, unspecified: Secondary | ICD-10-CM | POA: Diagnosis not present

## 2018-05-21 DIAGNOSIS — G8929 Other chronic pain: Secondary | ICD-10-CM | POA: Diagnosis not present

## 2018-05-21 DIAGNOSIS — C538 Malignant neoplasm of overlapping sites of cervix uteri: Secondary | ICD-10-CM

## 2018-05-21 DIAGNOSIS — M545 Low back pain: Secondary | ICD-10-CM | POA: Insufficient documentation

## 2018-05-21 MED ORDER — ALPRAZOLAM 0.25 MG PO TABS: ORAL_TABLET | ORAL | 0 refills | Status: DC

## 2018-05-21 MED ORDER — MORPHINE SULFATE ER 30 MG PO TBCR: 30.0000 mg | EXTENDED_RELEASE_TABLET | Freq: Two times a day (BID) | ORAL | 0 refills | Status: DC

## 2018-05-21 MED ORDER — HYDROCODONE-ACETAMINOPHEN 5-325 MG PO TABS: 1.0000 | ORAL_TABLET | Freq: Four times a day (QID) | ORAL | 0 refills | Status: DC | PRN

## 2018-05-21 NOTE — Progress Notes (Signed)
Patient here to discuss CT results.  Is having pain in lower right abdomen and back pain today on 7/10.  She has good and bad days with the pain and today seems to be a bad day.

## 2018-05-23 ENCOUNTER — Other Ambulatory Visit: Payer: Self-pay | Admitting: Radiology

## 2018-05-23 DIAGNOSIS — N135 Crossing vessel and stricture of ureter without hydronephrosis: Secondary | ICD-10-CM

## 2018-05-23 DIAGNOSIS — N1330 Unspecified hydronephrosis: Secondary | ICD-10-CM

## 2018-05-23 NOTE — Telephone Encounter (Signed)
Sounds good, please arrange.    Hollice Espy, MD

## 2018-05-23 NOTE — Telephone Encounter (Signed)
Dr Erlene Quan - Pt called to arrange this after speaking with [facility]. Please advise.   Telephone Encounter - Coralyn Mark, Audrea Muscat, MD - 05/22/2018 5:11 PM EDT I called Mrs. Mckain to follow-up on our visit from last week in regards to her right ureteral stricture secondary to treatment of cervical cancer.  I discussed her case with our reconstruction team, who said that since she is 1 year out from her radiation therapy it is reasonable to go ahead and begin planning for potential reconstruction surgery.  The first step that they requested is for her to have her nephrostomy tube replaced in indwelling ureteral stent removed. Then, once she has been without indwelling ureteral stent for approximately 6 weeks, they will see her in the office to perform antegrade nephrostogram in order to maximally characterize the location, length, degree of her distal ureteral stricture. Based on the results of that study, they will make the determination of exactly which reconstructive procedure would be best for her.  Going forward, her plan is to contact her local urologist's office to set up a nephrostomy placement and stent removal. Once she has those dates, I have asked her to contact our office and let us know so that I can schedule her appointment for antegrade nephrostogram with the reconstruction team.   She is on board and agreeable with plan.  Mellody Memos, MD 05/22/2018 5:17 PM

## 2018-05-24 NOTE — Telephone Encounter (Signed)
Patient notified of appointment date/arrival time. Instructions given. Questions answered. Pt voices understanding.

## 2018-05-28 ENCOUNTER — Telehealth: Payer: Self-pay | Admitting: *Deleted

## 2018-05-28 NOTE — Telephone Encounter (Signed)
I think she was on a fentanyl patch before, but I don;t remember if she's had issues with it. 2mcg to start if she can take it.

## 2018-05-28 NOTE — Telephone Encounter (Signed)
Patient called and reports that the [facility] is making her nauseated and is requesting that something different be prescribed. Please advise

## 2018-05-28 NOTE — Telephone Encounter (Signed)
Ok, lets do the pain clinic referral sooner than later.  Continue oxycodone.  Thanks.

## 2018-05-28 NOTE — Telephone Encounter (Addendum)
She states they did her the same way as [facility] with nausea.   PLAN:    1. Squamous cell carcinoma of the cervix: Patient completed concurrent chemotherapy and XRT in April 2018. [facility]rapy at [facility] has also been completed.  [facility] reviewed independently with concern of local recurrence of disease, but surgical biopsy did not reveal any evidence of malignancy.  CT scan results from May 13, 2018 reviewed continued decrease of size of known cervical mass.  No intervention is needed at this time.  Return to clinic in 3 months with repeat imaging and further evaluation. 2.  Ureteral stricture: Patient is possibly having reconstruction at [facility].  Continue follow-up as indicated.  Imaging and follow-up as above may change based on her surgical date. 3.  Pain: Continue oxycodone as needed.  Patient expressed understanding that the conclusion of her surgery at [facility] she still is having issues with chronic pain she will be referred to pain clinic. 4. Anxiety: Continue Xanax as needed. 5. Renal insufficiency: Right  ureteral stent in place.  Possible surgery as above.

## 2018-05-29 ENCOUNTER — Other Ambulatory Visit: Payer: Self-pay | Admitting: *Deleted

## 2018-05-29 DIAGNOSIS — G8929 Other chronic pain: Secondary | ICD-10-CM

## 2018-05-29 NOTE — Telephone Encounter (Signed)
Patient called back and states her bottle shows refill was on 05/16/18, I called pharmacy and they confirmed her last prescription she picked up was on 6/6, but they have the 6/11 prescription on profile for her and I called her and let her know to call them to see when she can get it refilled. She thanked me for calling and letting her know

## 2018-05-29 NOTE — Telephone Encounter (Signed)
Per Tillie Rung, referral sent to pain clinic

## 2018-05-29 NOTE — Telephone Encounter (Addendum)
Patient called and asked what the decision was and I advised her that he decided to refer her to pain clinic. I explained to her that it may take a week or 2 before she gets an appointment. I also asked if she tried taking her nausea medicine before her ER medicine and she said she did and still has nausea. She said she would just have to get along with the Norco and asked if it can be refilled. I checked her chart and told her it cannot be refilled until 06/04/18 and that she is prescribed 1 tablet every 6 hours as needed and if she is taking more that that then she is not taking it as directed. She states she told doctor at appointment that she is taking more than prescribed. I told her we would not refill until it is due, she is taking almost twice as much as she should be

## 2018-06-04 ENCOUNTER — Other Ambulatory Visit: Payer: Self-pay | Admitting: Radiology

## 2018-06-05 ENCOUNTER — Ambulatory Visit
Admission: RE | Admit: 2018-06-05 | Discharge: 2018-06-05 | Disposition: A | Discharge status: Home / Self Care | Payer: Medicaid Other | Source: Ambulatory Visit | Attending: Urology | Admitting: Urology

## 2018-06-05 DIAGNOSIS — Z8541 Personal history of malignant neoplasm of cervix uteri: Secondary | ICD-10-CM | POA: Diagnosis not present

## 2018-06-05 DIAGNOSIS — N135 Crossing vessel and stricture of ureter without hydronephrosis: Secondary | ICD-10-CM | POA: Insufficient documentation

## 2018-06-05 DIAGNOSIS — N1330 Unspecified hydronephrosis: Secondary | ICD-10-CM

## 2018-06-05 HISTORY — PX: IR URETERAL STENT PERC REMOVAL MOD SED: IMG2338

## 2018-06-05 HISTORY — PX: IR NEPHROSTOMY PLACEMENT RIGHT: IMG6064

## 2018-06-05 LAB — BASIC METABOLIC PANEL
Anion gap: 8 (ref 5–15)
BUN: 23 mg/dL — ABNORMAL HIGH (ref 6–20)
CO2: 24 mmol/L (ref 22–32)
Calcium: 9 mg/dL (ref 8.9–10.3)
Chloride: 107 mmol/L (ref 98–111)
Creatinine, Ser: 1.38 mg/dL — ABNORMAL HIGH (ref 0.44–1.00)
GFR calc Af Amer: 50 mL/min — ABNORMAL LOW (ref >60)
GFR calc non Af Amer: 43 mL/min — ABNORMAL LOW (ref >60)
Glucose, Bld: 92 mg/dL (ref 70–99)
Potassium: 4.3 mmol/L (ref 3.5–5.1)
Sodium: 139 mmol/L (ref 135–145)

## 2018-06-05 LAB — CBC
HCT: 39.3 % (ref 35.0–47.0)
Hemoglobin: 13.2 g/dL (ref 12.0–16.0)
MCH: 27.5 pg (ref 26.0–34.0)
MCHC: 33.7 g/dL (ref 32.0–36.0)
MCV: 81.7 fL (ref 80.0–100.0)
PLATELETS: 264 10*3/uL (ref 150–440)
RBC: 4.81 MIL/uL (ref 3.80–5.20)
RDW: 15.2 % — ABNORMAL HIGH (ref 11.5–14.5)
WBC: 5.5 10*3/uL (ref 3.6–11.0)

## 2018-06-05 LAB — PROTIME-INR
INR: 0.95
PROTHROMBIN TIME: 12.6 s (ref 11.4–15.2)

## 2018-06-05 MED ORDER — HYDROCODONE-ACETAMINOPHEN 5-325 MG PO TABS: 1.0000 | ORAL_TABLET | ORAL | Status: DC | PRN

## 2018-06-05 MED ORDER — SODIUM CHLORIDE 0.9% FLUSH: 5.0000 mL | Freq: Three times a day (TID) | INTRAVENOUS | Status: DC

## 2018-06-05 MED ORDER — CIPROFLOXACIN IN D5W 400 MG/200ML IV SOLN
INTRAVENOUS | Status: AC
  Administered 2018-06-05: 12:00:00
  Filled 2018-06-05: qty 200

## 2018-06-05 MED ORDER — ONDANSETRON HCL 4 MG/2ML IJ SOLN
INTRAMUSCULAR | Status: AC
  Administered 2018-06-05: 4 mg
  Filled 2018-06-05: qty 2

## 2018-06-05 MED ORDER — ONDANSETRON HCL 4 MG/2ML IJ SOLN
4.0000 mg | Freq: Once | INTRAMUSCULAR | Status: DC
  Filled 2018-06-05: qty 2

## 2018-06-05 MED ORDER — MIDAZOLAM HCL 2 MG/2ML IJ SOLN
INTRAMUSCULAR | Status: AC
  Filled 2018-06-05: qty 2

## 2018-06-05 MED ORDER — MIDAZOLAM HCL 5 MG/5ML IJ SOLN
INTRAMUSCULAR | Status: AC | PRN
  Administered 2018-06-05: 1 mg via INTRAVENOUS
  Administered 2018-06-05: 0.5 mg via INTRAVENOUS
  Administered 2018-06-05: 1 mg via INTRAVENOUS
  Administered 2018-06-05: 2 mg via INTRAVENOUS
  Administered 2018-06-05: 0.5 mg via INTRAVENOUS

## 2018-06-05 MED ORDER — MIDAZOLAM HCL 2 MG/2ML IJ SOLN
INTRAMUSCULAR | Status: AC | PRN
  Administered 2018-06-05: 0.5 mg via INTRAVENOUS

## 2018-06-05 MED ORDER — FENTANYL CITRATE (PF) 100 MCG/2ML IJ SOLN
INTRAMUSCULAR | Status: AC | PRN
  Administered 2018-06-05 (×6): 25 ug via INTRAVENOUS
  Administered 2018-06-05 (×2): 50 ug via INTRAVENOUS
  Administered 2018-06-05: 25 ug via INTRAVENOUS

## 2018-06-05 MED ORDER — MIDAZOLAM HCL 5 MG/5ML IJ SOLN
INTRAMUSCULAR | Status: AC
  Filled 2018-06-05: qty 5

## 2018-06-05 MED ORDER — LIDOCAINE HCL (PF) 1 % IJ SOLN
INTRAMUSCULAR | Status: AC | PRN
  Administered 2018-06-05: 10 mL

## 2018-06-05 MED ORDER — LIDOCAINE HCL (PF) 1 % IJ SOLN
INTRAMUSCULAR | Status: AC
Start: 2018-06-05 — End: ?
  Filled 2018-06-05: qty 30

## 2018-06-05 MED ORDER — HYDROMORPHONE HCL 1 MG/ML IJ SOLN
INTRAMUSCULAR | Status: AC
  Filled 2018-06-05: qty 1

## 2018-06-05 MED ORDER — FENTANYL CITRATE (PF) 100 MCG/2ML IJ SOLN
INTRAMUSCULAR | Status: AC
  Filled 2018-06-05: qty 4

## 2018-06-05 MED ORDER — IOPAMIDOL (ISOVUE-300) INJECTION 61%
30.0000 mL | Freq: Once | INTRAVENOUS | Status: AC | PRN
  Administered 2018-06-05: 15 mL via INTRAVENOUS

## 2018-06-05 MED ORDER — SODIUM CHLORIDE 0.9 % IV SOLN
INTRAVENOUS | Status: DC
  Administered 2018-06-05: 12:00:00 via INTRAVENOUS

## 2018-06-05 MED ORDER — HYDROMORPHONE HCL 1 MG/ML IJ SOLN
1.0000 mg | Freq: Once | INTRAMUSCULAR | Status: AC
  Administered 2018-06-05: 1 mg via INTRAVENOUS

## 2018-06-05 MED ORDER — CIPROFLOXACIN IN D5W 400 MG/200ML IV SOLN: 400.0000 mg | INTRAVENOUS | Status: DC

## 2018-06-05 MED ORDER — FENTANYL CITRATE (PF) 100 MCG/2ML IJ SOLN
INTRAMUSCULAR | Status: AC
  Filled 2018-06-05: qty 2

## 2018-06-05 NOTE — Discharge Instructions (Signed)
Percutaneous Nephrostomy, Care After  This sheet gives you information about how to care for yourself after your procedure. Your health care provider may also give you more specific instructions. If you have problems or questions, contact your health care provider.  What can I expect after the procedure?  After the procedure, it is common to have:  · Some soreness where the nephrostomy tube was inserted (tube insertion site).  · Blood-tinged drainage from the nephrostomy tube for the first 24 hours.    Follow these instructions at home:  Activity  · Return to your normal activities as told by your health care provider. Ask your health care provider what activities are safe for you.  · Avoid activities that may cause the nephrostomy tubing to bend.  · Do not take baths, swim, or use a hot tub until your health care provider approves. Ask your health care provider if you can take showers. Cover the nephrostomy tube dressing with a watertight covering when you take a shower.  · Do not drive for 24 hours if you were given a medicine to help you relax (sedative).  Care of the tube insertion site  · Follow instructions from your health care provider about how to take care of your tube insertion site. Make sure you:  ? Wash your hands with soap and water before you change your bandage (dressing). If soap and water are not available, use hand sanitizer.  ? Change your dressing as told by your health care provider. Be careful not to pull on the tube while removing the dressing.  ? When you change the dressing, wash the skin around the tube, rinse well, and pat the skin dry.  · Check the tube insertion area every day for signs of infection. Check for:  ? More redness, swelling, or pain.  ? More fluid or blood.  ? Warmth.  ? Pus or a bad smell.  Care of the nephrostomy tube and drainage bag  · Always keep the tubing, the leg bag, or the bedside drainage bags below the level of the kidney so that your urine drains  freely.  · When connecting your nephrostomy tube to a drainage bag, make sure that there are no kinks in the tubing and that your urine is draining freely. You may want to use an elastic bandage to wrap any exposed tubing that goes from the nephrostomy tube to any of the connecting tubes.  · At night, you may want to connect your nephrostomy tube or the leg bag to a larger bedside drainage bag.  · Follow instructions from your health care provider about how to empty or change the drainage bag.  · Empty the drainage bag when it becomes ? full.  · Replace the drainage bag and any extension tubing that is connected to your nephrostomy tube every 3 weeks or as often as told by your health care provider. Your health care provider will explain how to change the drainage bag and extension tubing.  General instructions  · Take over-the-counter and prescription medicines only as told by your health care provider.  · Keep all follow-up visits as told by your health care provider. This is important.  Contact a health care provider if:  · You have problems with any of the valves or tubing.  · You have persistent pain or soreness in your back.  · You have more redness, swelling, or pain around your tube insertion site.  · You have more fluid or blood coming from   your tube insertion site.  · Your tube insertion site feels warm to the touch.  · You have pus or a bad smell coming from your tube insertion site.  · You have increased urine output or you feel burning when urinating.  Get help right away if:  · You have pain in your abdomen during the first week.  · You have chest pain or have trouble breathing.  · You have a new appearance of blood in your urine.  · You have a fever or chills.  · You have back pain that is not relieved by your medicine.  · You have decreased urine output.  · Your nephrostomy tube comes out.  This information is not intended to replace advice given to you by your health care provider. Make sure you  discuss any questions you have with your health care provider.  Document Released: 07/20/2004 Document Revised: 09/08/2016 Document Reviewed: 09/08/2016  Elsevier Interactive Patient Education © 2018 Elsevier Inc.

## 2018-06-05 NOTE — Procedures (Signed)
  Procedure: R perc nephrostomy placement, retrieval R JJ stent   EBL:   minimal Complications:  none immediate  See full dictation in [facility].  Dillard Cannon MD Main # [phone] Pager  [phone]

## 2018-06-05 NOTE — OR Nursing (Signed)
Pt requested pre med for nausea, zofran IV given as ordered by Dr Vernard Gambles

## 2018-06-14 ENCOUNTER — Other Ambulatory Visit: Payer: Self-pay | Admitting: Oncology

## 2018-06-14 ENCOUNTER — Telehealth: Payer: Self-pay | Admitting: Internal Medicine

## 2018-06-14 ENCOUNTER — Other Ambulatory Visit: Payer: Self-pay | Admitting: *Deleted

## 2018-06-14 MED ORDER — HYDROCODONE-ACETAMINOPHEN 5-325 MG PO TABS: 1.0000 | ORAL_TABLET | Freq: Four times a day (QID) | ORAL | 0 refills | Status: DC | PRN

## 2018-06-14 MED ORDER — PROMETHAZINE HCL 25 MG PO TABS: 25.0000 mg | ORAL_TABLET | Freq: Four times a day (QID) | ORAL | 0 refills | Status: DC | PRN

## 2018-06-14 NOTE — Telephone Encounter (Signed)
Pt called for hydrocodone/ pheneragn refill; at 4:55 pm; states she had called in AM; and nobody had returned her call/refilled. will refill for 1 week. She will call office on Monday; 7/78 for further refills.

## 2018-06-17 ENCOUNTER — Other Ambulatory Visit: Payer: Self-pay | Admitting: *Deleted

## 2018-06-17 MED ORDER — PROMETHAZINE HCL 25 MG PO TABS: 25.0000 mg | ORAL_TABLET | Freq: Four times a day (QID) | ORAL | 1 refills | Status: DC | PRN

## 2018-06-17 MED ORDER — HYDROCODONE-ACETAMINOPHEN 5-325 MG PO TABS: 1.0000 | ORAL_TABLET | Freq: Four times a day (QID) | ORAL | 0 refills | Status: DC | PRN

## 2018-06-17 NOTE — Telephone Encounter (Signed)
Ok to refill both.  Thanks.

## 2018-06-17 NOTE — Telephone Encounter (Signed)
Patient had requested refill of Norco and Phenergan Friday and the phenergan was denied and she is upset that she was only given #30 Norco. She wants this straightened out. Please advise

## 2018-06-27 ENCOUNTER — Telehealth: Payer: Self-pay | Admitting: *Deleted

## 2018-06-27 ENCOUNTER — Telehealth: Payer: Self-pay | Admitting: Nurse Practitioner

## 2018-06-27 MED ORDER — HYDROCODONE-ACETAMINOPHEN 5-325 MG PO TABS: 1.0000 | ORAL_TABLET | Freq: Four times a day (QID) | ORAL | 0 refills | Status: DC | PRN

## 2018-06-27 NOTE — Telephone Encounter (Signed)
Patient called to request refill for oxycodone. MD has been giving patient 30 day supply.

## 2018-06-27 NOTE — Telephone Encounter (Signed)
Patient called Patricia Rojas requesting refill of oxycodone. Chart review reveals patient currently on Norco 5-325.   As mandated by the [place] STOP Act (Strengthen Opioid Misuse Prevention), the [place] Controlled Substance Reporting System ([place]) was reviewed for this patient.  Below is the past 27-months of controlled substance prescriptions as displayed by the registry.  I have personally consulted with my supervising physician, [name], who agrees that continuation of opiate therapy is medically appropriate at this time and agrees to provide continual monitoring, including urine/blood drug screens, as indicated.   Patient has been scheduled for referral to pain management and has appointment on 07/16/18.   [place] Reviewed:     Based on [place] information, patient received 60 tablets of Norco since 06/14/2018 which based on prescribed dosing, is a 15 day supply. Will schedule fill date for refill on 06/30/18 accordingly. Will provide patient with 7 day supply of medication or 28 tablets.   Prescription sent electronically using [employer] secure transmission to requested pharmacy.    Beckey Rutter, DNP, AGNP-[facility]

## 2018-06-28 ENCOUNTER — Telehealth: Payer: Self-pay | Admitting: *Deleted

## 2018-06-28 NOTE — Telephone Encounter (Signed)
Patient would like refill for hydrocodone. Her pharmacy told her that she could not refill until 721. She states that she has taken 4 a day as prescribed and is out of medication. Her last refill was 7/11 for 30 tabs.

## 2018-06-28 NOTE — Telephone Encounter (Signed)
Called patient to discuss her concerns. Explained that based on [place] she had received 60 pills since 06/14/18. If she was taking as prescribed she would not be due for refill until 06/30/18 and that this is why her insurance did not cover her previous prescription (it was too early). She states she 'must've took too many' and is used to calling for prescription every Friday. Explained rationale and reminded her of scheduled pain clinic appt. She was pleasant and thanked for call.

## 2018-07-03 ENCOUNTER — Telehealth: Payer: Self-pay | Admitting: Radiology

## 2018-07-03 ENCOUNTER — Encounter: Payer: Self-pay | Admitting: Radiology

## 2018-07-03 NOTE — Telephone Encounter (Signed)
Please provide her with a letter stating that she has a history of ureteral obstruction secondary to treatment from her cervical malignancy.  As such, she is required percutaneous nephrostomy tube and ureteral stent.  She has had chronic pain related to these interventions and also severe urinary symptoms.  Hollice Espy, MD

## 2018-07-03 NOTE — Telephone Encounter (Signed)
Letter left at front desk for patient to pick up.

## 2018-07-03 NOTE — Telephone Encounter (Signed)
Patient requests a letter from Dr Erlene Quan supporting the need for disability due to her condition to be used during her disability hearing. Please advise.

## 2018-07-05 ENCOUNTER — Other Ambulatory Visit: Payer: Self-pay | Admitting: *Deleted

## 2018-07-05 MED ORDER — HYDROCODONE-ACETAMINOPHEN 5-325 MG PO TABS: 1.0000 | ORAL_TABLET | Freq: Four times a day (QID) | ORAL | 0 refills | Status: AC | PRN

## 2018-07-05 NOTE — Telephone Encounter (Signed)
Patient reports that she has an appointment with [facility] on 8/6

## 2018-07-16 ENCOUNTER — Ambulatory Visit: Payer: Medicaid Other | Admitting: Student in an Organized Health Care Education/Training Program

## 2018-08-07 ENCOUNTER — Ambulatory Visit: Payer: Medicaid Other | Admitting: Student in an Organized Health Care Education/Training Program

## 2018-08-14 ENCOUNTER — Telehealth: Payer: Self-pay | Admitting: Radiology

## 2018-08-14 NOTE — Telephone Encounter (Signed)
Patient called to request appointment for right nephrostomy tube exchange per [facility] recommendation.  Below is the note provided in [place] from [facility]. Plan: The patient completed UDS today. Her bladder has good capacity and safe for reimplant.   In review of her case with Dr. Terance Hart, it was noted that her BMI is now 89. Given the risks involved with increased BMI and her radiation history, it was recommended that the surgery be post-poned until she has BMI under 40. Patient is upset about delay, but seems to understand the risks. She understands she will need to get down to 230 lb and will call us when she is about half way there to arrange for scheduling. In the meantime, she will continue to managed with PCN. She will have exchanged every 3 months. I have placed referral with nutritionist to help with weight loss.  I personally performed the service, non-incident to. [facility])  Bayard Hugger, PA   Electronically signed by Bayard Hugger, PA at 08/13/2018 8:02 PM EDT

## 2018-08-14 NOTE — Telephone Encounter (Signed)
If we need to keep doing this for her, we can, however, they can also call our interventional radiology and fax orders.  That would be my preference rather than getting this thirdhand.  Hollice Espy, MD

## 2018-08-23 ENCOUNTER — Ambulatory Visit
Admission: RE | Admit: 2018-08-23 | Discharge: 2018-08-23 | Disposition: A | Discharge status: Home / Self Care | Payer: Medicaid Other | Source: Ambulatory Visit | Attending: Oncology | Admitting: Oncology

## 2018-08-23 ENCOUNTER — Inpatient Hospital Stay: Payer: Medicaid Other | Attending: Oncology

## 2018-08-23 DIAGNOSIS — C539 Malignant neoplasm of cervix uteri, unspecified: Secondary | ICD-10-CM | POA: Diagnosis present

## 2018-08-23 DIAGNOSIS — K429 Umbilical hernia without obstruction or gangrene: Secondary | ICD-10-CM | POA: Insufficient documentation

## 2018-08-23 DIAGNOSIS — C538 Malignant neoplasm of overlapping sites of cervix uteri: Secondary | ICD-10-CM

## 2018-08-23 DIAGNOSIS — Z87891 Personal history of nicotine dependence: Secondary | ICD-10-CM | POA: Insufficient documentation

## 2018-08-23 DIAGNOSIS — N289 Disorder of kidney and ureter, unspecified: Secondary | ICD-10-CM | POA: Diagnosis not present

## 2018-08-23 DIAGNOSIS — Z936 Other artificial openings of urinary tract status: Secondary | ICD-10-CM | POA: Insufficient documentation

## 2018-08-23 HISTORY — DX: Malignant neoplasm of cervix uteri, unspecified: C53.9

## 2018-08-23 LAB — COMPREHENSIVE METABOLIC PANEL
ALK PHOS: 100 U/L (ref 38–126)
ALT: 16 U/L (ref 0–44)
ANION GAP: 7 (ref 5–15)
AST: 24 U/L (ref 15–41)
Albumin: 4.3 g/dL (ref 3.5–5.0)
BILIRUBIN TOTAL: 0.8 mg/dL (ref 0.3–1.2)
BUN: 24 mg/dL — AB (ref 6–20)
CO2: 25 mmol/L (ref 22–32)
Calcium: 9.6 mg/dL (ref 8.9–10.3)
Chloride: 106 mmol/L (ref 98–111)
Creatinine, Ser: 1.39 mg/dL — ABNORMAL HIGH (ref 0.44–1.00)
GFR calc Af Amer: 50 mL/min — ABNORMAL LOW (ref >60)
GFR, EST NON AFRICAN AMERICAN: 43 mL/min — AB (ref >60)
Glucose, Bld: 94 mg/dL (ref 70–99)
POTASSIUM: 4.4 mmol/L (ref 3.5–5.1)
Sodium: 138 mmol/L (ref 135–145)
TOTAL PROTEIN: 8.1 g/dL (ref 6.5–8.1)

## 2018-08-23 LAB — CBC WITH DIFFERENTIAL/PLATELET
BASOS ABS: 0.1 10*3/uL (ref 0–0.1)
Basophils Relative: 1 %
EOS PCT: 7 %
Eosinophils Absolute: 0.3 10*3/uL (ref 0–0.7)
HCT: 40.7 % (ref 35.0–47.0)
Hemoglobin: 13.4 g/dL (ref 12.0–16.0)
LYMPHS PCT: 28 %
Lymphs Abs: 1.4 10*3/uL (ref 1.0–3.6)
MCH: 27.3 pg (ref 26.0–34.0)
MCHC: 33 g/dL (ref 32.0–36.0)
MCV: 82.9 fL (ref 80.0–100.0)
MONO ABS: 0.4 10*3/uL (ref 0.2–0.9)
Monocytes Relative: 9 %
NEUTROS ABS: 2.8 10*3/uL (ref 1.4–6.5)
Neutrophils Relative %: 55 %
PLATELETS: 271 10*3/uL (ref 150–440)
RBC: 4.91 MIL/uL (ref 3.80–5.20)
RDW: 15.3 % — AB (ref 11.5–14.5)
WBC: 5 10*3/uL (ref 3.6–11.0)

## 2018-08-23 MED ORDER — IOPAMIDOL (ISOVUE-300) INJECTION 61%
80.0000 mL | Freq: Once | INTRAVENOUS | Status: AC | PRN
  Administered 2018-08-23: 75 mL via INTRAVENOUS

## 2018-08-24 NOTE — Progress Notes (Signed)
Patricia Rojas  Telephone:([phone]) [phone] Fax:(336) [phone]  ID: Patricia Rojas OB: 11/20/1966  MR#: [id number]  MLY#:[id number]  Patient Care Team: [name], MD as PCP - General (Infectious Diseases) Clent Jacks, RN as Registered Nurse  CHIEF COMPLAINT: Squamous cell carcinoma of the cervix.  INTERVAL HISTORY: Patient returns to clinic today for further evaluation and discussion of her imaging results.  She is mildly uncomfortable today secondary to having her nephrostomy tube changed yesterday, but otherwise feels well.  She no longer has chronic pain and has discontinued all narcotics.  She otherwise feels well and is asymptomatic. She has no neurologic complaints. She denies any recent fevers or illnesses. She has no chest pain or shortness of breath.  She denies any nausea, vomiting, constipation, or diarrhea.  She has no urinary complaints.  Patient offers no further specific complaints today.  REVIEW OF SYSTEMS:   Review of Systems  Constitutional: Negative.  Negative for fever, malaise/fatigue and weight loss.  Respiratory: Negative.  Negative for cough and shortness of breath.   Cardiovascular: Negative.  Negative for chest pain and leg swelling.  Gastrointestinal: Negative.  Negative for abdominal pain, constipation, diarrhea and nausea.  Genitourinary: Negative.  Negative for flank pain.  Musculoskeletal: Negative.  Negative for back pain.  Skin: Negative.  Negative for rash.  Neurological: Negative.  Negative for sensory change, focal weakness, weakness and headaches.  Psychiatric/Behavioral: Negative.  Negative for depression. The patient is not nervous/anxious.     As per HPI. Otherwise, a complete review of systems is negative.  PAST MEDICAL HISTORY: Past Medical History:  Diagnosis Date  . Anxiety   . Cervical cancer ([place])    internal rad tx's and chemo tx's.   . Constipation   . Depression   . GERD (gastroesophageal reflux disease)     . Hemorrhoids   . Hypertension   . N&V (nausea and vomiting)   . PONV (postoperative nausea and vomiting)    PT RECEIVED SCOPLAMINE PATCH FOR 01-09-18 SURGERY AND SHE HAD NO N/V    PAST SURGICAL HISTORY: Past Surgical History:  Procedure Laterality Date  . CESAREAN SECTION  1986  . CHOLECYSTECTOMY    . CYSTOSCOPY W/ RETROGRADES Right 03/25/2018   Procedure: CYSTOSCOPY WITH RETROGRADE PYELOGRAM;  Surgeon: Hollice Espy, MD;  Location: [facility] ORS;  Service: Urology;  Laterality: Right;  . CYSTOSCOPY W/ URETERAL STENT PLACEMENT Right 03/25/2018   Procedure: CYSTOSCOPY WITH STENT REPLACEMENT;  Surgeon: Hollice Espy, MD;  Location: [facility] ORS;  Service: Urology;  Laterality: Right;  . HERNIA REPAIR    . IR GENERIC HISTORICAL  02/23/2017   IR NEPHROSTOMY PLACEMENT RIGHT 02/23/2017 [name], MD [facility]-INTERV RAD  . IR NEPHROSTOGRAM RIGHT THRU EXISTING ACCESS  02/15/2018  . IR NEPHROSTOMY EXCHANGE RIGHT  04/18/2017  . IR NEPHROSTOMY EXCHANGE RIGHT  06/08/2017  . IR NEPHROSTOMY EXCHANGE RIGHT  08/17/2017  . IR NEPHROSTOMY EXCHANGE RIGHT  10/05/2017  . IR NEPHROSTOMY EXCHANGE RIGHT  12/28/2017  . IR NEPHROSTOMY PLACEMENT RIGHT  06/05/2018  . IR URETERAL STENT PERC REMOVAL MOD SED  06/05/2018  . IR URETERAL STENT PLACEMENT EXISTING ACCESS RIGHT  02/15/2018  . VULVA /PERINEUM BIOPSY N/A 01/09/2018   Procedure: VAGINAL  AND CERVIX BIOPSY;  Surgeon: [name], MD;  Location: [facility] ORS;  Service: Gynecology;  Laterality: N/A;    FAMILY HISTORY: Family History  Problem Relation Age of Onset  . Cancer Maternal Uncle   . Cancer Paternal Aunt   . Kidney cancer Neg  Hx   . Bladder Cancer Neg Hx     ADVANCED DIRECTIVES (Y/N):  N  HEALTH MAINTENANCE: Social History   Tobacco Use  . Smoking status: Former Smoker    Types: Cigarettes    Last attempt to quit: 1990    Years since quitting: 29.7  . Smokeless tobacco: Never Used  Substance Use Topics  . Alcohol use: No  . Drug use: No     Comment: stopped 3 weeks ago     Colonoscopy:  PAP:  Bone density:  Lipid panel:  No Known Allergies  Current Outpatient Medications  Medication Sig Dispense Refill  . amLODipine (NORVASC) 2.5 MG tablet Take 1 tablet (2.5 mg total) by mouth daily. (Patient taking differently: Take 2.5 mg by mouth every Rojas. ) 30 tablet 0  . escitalopram (LEXAPRO) 10 MG tablet TAKE 1 TABLET BY MOUTH EVERY DAY FOR MOOD  1   No current facility-administered medications for this visit.     OBJECTIVE: Vitals:   08/27/18 1331  BP: (!) 149/97  Pulse: 81  Resp: 20  Temp: 97.8 F (36.6 C)     Body mass index is 42.93 kg/m.    ECOG FS:0 - Asymptomatic  General: Well-developed, well-nourished, no acute distress. Eyes: Pink conjunctiva, anicteric sclera. HEENT: Normocephalic, moist mucous membranes. Lungs: Clear to auscultation bilaterally. Heart: Regular rate and rhythm. No rubs, murmurs, or gallops. Abdomen: Soft, nontender, nondistended. No organomegaly noted, normoactive bowel sounds.  Nephrostomy tube in place. Musculoskeletal: No edema, cyanosis, or clubbing. Neuro: Alert, answering all questions appropriately. Cranial nerves grossly intact. Skin: No rashes or petechiae noted. Psych: Normal affect.  LAB RESULTS:  Lab Results  Component Value Date   NA 138 08/23/2018   K 4.4 08/23/2018   CL 106 08/23/2018   CO2 25 08/23/2018   GLUCOSE 94 08/23/2018   BUN 24 (H) 08/23/2018   CREATININE 1.39 (H) 08/23/2018   CALCIUM 9.6 08/23/2018   PROT 8.1 08/23/2018   ALBUMIN 4.3 08/23/2018   AST 24 08/23/2018   ALT 16 08/23/2018   ALKPHOS 100 08/23/2018   BILITOT 0.8 08/23/2018   GFRNONAA 43 (L) 08/23/2018   GFRAA 50 (L) 08/23/2018    Lab Results  Component Value Date   WBC 5.0 08/23/2018   NEUTROABS 2.8 08/23/2018   HGB 13.4 08/23/2018   HCT 40.7 08/23/2018   MCV 82.9 08/23/2018   PLT 271 08/23/2018     STUDIES: Ct Abdomen Pelvis W Contrast  Result Date: 08/23/2018 CLINICAL  DATA:  52 year old female with history of squamous cell carcinoma of the cervix originally diagnosed in March 2018. Status post radiation therapy and chemotherapy. Restaging examination. EXAM: CT ABDOMEN AND PELVIS WITH CONTRAST TECHNIQUE: Multidetector CT imaging of the abdomen and pelvis was performed using the standard protocol following bolus administration of intravenous contrast. CONTRAST:  24m ISOVUE-300 IOPAMIDOL (ISOVUE-300) INJECTION 61% COMPARISON:  CT the abdomen and pelvis 05/13/2018. FINDINGS: Lower chest: Unremarkable. Hepatobiliary: No suspicious cystic or solid hepatic lesions. No intra or extrahepatic biliary ductal dilatation. Status post cholecystectomy. Pancreas: No pancreatic mass. No pancreatic ductal dilatation. No pancreatic or peripancreatic fluid or inflammatory changes. Spleen: Unremarkable. Adrenals/Urinary Tract: Previously noted right-sided ureteral stent has been removed. There is now a percutaneous nephrostomy tube in position with the tip reformed in the right renal pelvis. Bilateral kidneys and adrenal glands are otherwise normal in appearance. No hydroureteronephrosis. Urinary bladder is normal in appearance. Stomach/Bowel: Normal appearance of the stomach. No pathologic dilatation of small bowel or colon. Numerous colonic  diverticulae are noted, particularly in the sigmoid colon, without surrounding inflammatory changes to suggest an acute diverticulitis at this time. Normal appendix. Vascular/Lymphatic: Aortic atherosclerosis, without evidence of aneurysm or dissection in the abdominal or pelvic vasculature. No lymphadenopathy noted in the abdomen or pelvis. Reproductive: Uterus and ovaries are unremarkable in appearance. Other: Small umbilical hernia containing only omental fat. Postoperative changes of mesh repair for epigastric ventral hernia. No significant volume of ascites. No pneumoperitoneum. Musculoskeletal: There are no aggressive appearing lytic or blastic lesions  noted in the visualized portions of the skeleton. IMPRESSION: 1. No findings to suggest metastatic disease in the abdomen or pelvis. 2. Small umbilical hernia containing only omental fat. 3. Interval removal of right sided ureteral stent. New right percutaneous nephrostomy tube appears appropriately positioned. No hydroureteronephrosis. 4. Additional incidental findings, as above. Electronically Signed   By: Vinnie Langton M.D.   On: 08/23/2018 13:11    ASSESSMENT: Squamous cell carcinoma of the cervix, PDL-1 13%.  PLAN:    1. Squamous cell carcinoma of the cervix: Patient completed concurrent chemotherapy and XRT in April 2018. Brachytherapy at [facility] was completed shortly thereafter.  CT scan results from August 23, 2018 reviewed independently and reported as above with no findings to suggest metastatic or recurrent disease.  No intervention is needed at this time.  Return to clinic in 6 months with repeat imaging and further evaluation.  2.  Ureteral stricture: Patient had a nephrostomy tube changed yesterday.  She reports that reconstruction is planned, but she needs to lose 40 pounds prior to surgery.  Patient was given a referral to dietary. 3.  Pain: Patient does not complain of this today and has not discontinued all narcotics. 4.  Renal insufficiency: Nephrostomy tube in place.  Creatinine appears to be about her baseline.    Patient expressed understanding and was in agreement with this plan. She also understands that She can call clinic at any time with any questions, concerns, or complaints.   Cancer Staging Malignant neoplasm of cervix [facility]) Staging form: Corpus Uteri - Carcinoma and Carcinosarcoma, AJCC 8th Edition - Clinical stage from 02/18/2017: FIGO Stage IIIC1 (cT3b, cN1, cM0) - Signed by Lloyd Huger, MD on 02/18/2017   Lloyd Huger, MD   08/27/2018 1:39 PM

## 2018-08-27 ENCOUNTER — Inpatient Hospital Stay (HOSPITAL_BASED_OUTPATIENT_CLINIC_OR_DEPARTMENT_OTHER): Payer: Medicaid Other | Admitting: Oncology

## 2018-08-27 ENCOUNTER — Encounter: Payer: Self-pay | Admitting: Oncology

## 2018-08-27 VITALS — BP 149/97 | HR 81 | Temp 97.8°F | Resp 20 | Wt 258.0 lb

## 2018-08-27 DIAGNOSIS — Z936 Other artificial openings of urinary tract status: Secondary | ICD-10-CM | POA: Diagnosis not present

## 2018-08-27 DIAGNOSIS — N289 Disorder of kidney and ureter, unspecified: Secondary | ICD-10-CM | POA: Diagnosis not present

## 2018-08-27 DIAGNOSIS — C539 Malignant neoplasm of cervix uteri, unspecified: Secondary | ICD-10-CM | POA: Diagnosis not present

## 2018-08-27 DIAGNOSIS — C538 Malignant neoplasm of overlapping sites of cervix uteri: Secondary | ICD-10-CM

## 2018-08-27 DIAGNOSIS — Z87891 Personal history of nicotine dependence: Secondary | ICD-10-CM | POA: Diagnosis not present

## 2018-08-27 NOTE — Progress Notes (Signed)
Patient denies any concerns today.  

## 2018-09-02 ENCOUNTER — Inpatient Hospital Stay: Payer: Medicaid Other

## 2018-09-02 NOTE — Progress Notes (Signed)
Nutrition Follow-up:  Patient referred for help with loosing weight for upcoming surgery.   Patient currently under observation for cervical cancer.  Patient has completed chemotherapy and radiation therapy on April 2018.  Patient with ureteral stricture and has nephrostomy tube placed.  Patient planning to have reconstruction in December 2019 but has been asked to lose 40 pounds prior to surgery.    Met with patient today in clinic.  Patient reports that appetite is good/normal.  Reports that she has cut out soda (pepsi) and mainly drinking water and G-2.  Reports that she has already lost 7 pounds.   Medications: reviewed  Labs: reviewed  Anthropometrics:   Height: 65 inches Weight: 258 lb  BMI: 42   INTERVENTION:  Reviewed strategies to help with weight loss.   Encouraged patient to review information on [employer] Sample meal plan given today with calorie goal to promote weight loss. Encouraged discussing exercise options with MD and if safe to begin exercise program to aide in weight loss efforts.   If patient needs more support would recommend referral to [facility] ([place]) at [facility] for further weight loss management     MONITORING, EVALUATION, GOAL: none at this time   NEXT VISIT: no follow up planned  Patricia Rojas B. Zenia Resides, [place], [place] Registered Dietitian [phone] (pager)

## 2018-09-11 ENCOUNTER — Inpatient Hospital Stay: Payer: Medicaid Other

## 2018-10-09 ENCOUNTER — Inpatient Hospital Stay: Payer: Medicaid Other | Attending: Obstetrics and Gynecology | Admitting: Obstetrics and Gynecology

## 2018-10-09 VITALS — BP 126/87 | HR 82 | Temp 99.0°F | Resp 18 | Ht 65.0 in | Wt 257.7 lb

## 2018-10-09 DIAGNOSIS — Z08 Encounter for follow-up examination after completed treatment for malignant neoplasm: Secondary | ICD-10-CM | POA: Insufficient documentation

## 2018-10-09 DIAGNOSIS — Z9221 Personal history of antineoplastic chemotherapy: Secondary | ICD-10-CM | POA: Diagnosis not present

## 2018-10-09 DIAGNOSIS — C538 Malignant neoplasm of overlapping sites of cervix uteri: Secondary | ICD-10-CM

## 2018-10-09 DIAGNOSIS — Z8541 Personal history of malignant neoplasm of cervix uteri: Secondary | ICD-10-CM | POA: Diagnosis not present

## 2018-10-09 DIAGNOSIS — Z87891 Personal history of nicotine dependence: Secondary | ICD-10-CM | POA: Diagnosis not present

## 2018-10-09 DIAGNOSIS — Z923 Personal history of irradiation: Secondary | ICD-10-CM | POA: Diagnosis not present

## 2018-10-09 NOTE — Progress Notes (Signed)
Pt here for f/u she has some pelvic pain on right side and she feels it is scar tissue. She also has abd. Pain and back pain from nephrostomy tube that is still in place

## 2018-10-09 NOTE — Progress Notes (Signed)
Gynecologic Oncology Interval Visit   Referring Provider: Dr Christel Mormon & [name]  Chief Concern: Stage IIIb cervical cancer  Subjective:  Patricia Rojas is a 52 y.o. multiparous female who returns to clinic today for follow up for history of stage IIIb cervical cancer s/p concurrent chemotherapy and XRT 03/2017 followed by vaginal brachytherapy at [facility] with HDR x 5 on 04/20/2017.    CT abdomen pelvis on 08/23/2018 showed no findings suggestive of metastatic disease in abdomen or pelvis.  Small umbilical hernia containing omental fat, right percutaneous nephrostomy tube placed without evidence of hydroureteronephrosis. Has occasional pulling pain in RLQ.   Today, patient states reconstruction for ureteral stricture with Dr Terance Hart at [facility] has been delayed due to risk of complications d/t her BMI. Goal is to get down to 230 lbs and then plan for surgery. In interim, she will continue to be managed with PCN with exchanges every 3 months.    Gynecologic Oncology History Patricia Rojas is a pleasant multiparous female diagnosed with stage IIIb cervical cancer who is seen in consultation from ED. Please see prior notes for details.   02/07/17 CT scan shows large cervical mass and and right hydronephrosis. 1. Near circumferential irregular masslike density involving the lower uterus or cervix highly concerning for primary neoplasm. 2. Uterine/cervical mass causes obstruction of the distal right ureter in the pelvis with moderate hydroureteronephrosis. 3. Left external 8 mm iliac node is not enlarged by size criteria, however is indeterminate. 4. Colonic diverticulosis without diverticulitis. 5. Small nodes adjacent to the distal esophagus are of uncertain etiology and significance.   02/07/17 Cervical biopsy invasive squamous cell carcinoma. She was treated with primary chemoradiation. She has completed concurrent chemotherapy and external beam XRT in April 2018. Brachytherapy at [facility] was completed  with HDR x 5 on 04/20/2017.  Required right PCN for ureteral obstruction. 08/12/2017-08/15/2017 [facility] admission for urinary tract infection associated with nephrostomy catheter.  07/25/2017 PET/CT  Impression:  1. Hypermetabolic FDG uptake in the region of the cervix. Post- treatmentchanges may contribute to the increased radiotracer activity, but thedegree of radioactivity is concerning for residual disease.   2. No evidence of FDG avid nodal or distant metastatic disease.  Dr. Christel Mormon saw her on 07/25/2017 and had a negative pelvic exam. He recommended repeat imaging in 3 months at [facility].  This was done two weeks ago and showed persistent decreased size of FDG activity in cervix, but increased SUV max.  She continues to have right PCN and has constant RLQ pain treated with oxycodone.  No bleeding or discharge.   12/19/2017-endocervical curettings DIAGNOSIS:  A. ENDOCERVIX; CURETTAGE:  - ULCERATION, MARKED INFLAMMATION, AND STROMAL CYTOLOGIC CHANGES SUGGESTING RADIATION EFFECT.  - NEGATIVE FOR MALIGNANCY.  12/19/2017 Liquid Pap and HR HPV - Negative for intraepithelial lesion and malignancy -HR HPV negative  Patient seen in GYN oncology clinic by Dr. Fransisca Connors on 12/26/2017.  On exam, vaginal agglutination broken down at apex right adnexal tenderness without masses but exam limited due to body habitus, cervix was firm to palpation with limited mobility and some thickening on the right posterior area.  Digital exam firmness and nodularity of the right cervix thought to possibly extend into parametrium noted.   On 01/09/2018 patient underwent EUA, vaginal/cervical biopsies, and pelvic Tru-Cut biopsies. A. VAGINAL FORNIX, RIGHT; BIOPSY:  - ULCERATION, MARKED INFLAMMATION, AND STROMAL CYTOLOGIC CHANGES CONSISTENT WITH RADIATION EFFECT.  - NEGATIVE FOR MALIGNANCY.   B. RIGHT PELVIS; TRU-CUT BIOPSY:  - FIBROADIPOSE TISSUE.  - NEGATIVE  FOR MALIGNANCY.   Past Medical History: Past Medical History:   Diagnosis Date  . Anxiety   . Cervical cancer ([place])    internal rad tx's and chemo tx's.   . Constipation   . Depression   . GERD (gastroesophageal reflux disease)   . Hemorrhoids   . Hypertension   . N&V (nausea and vomiting)   . PONV (postoperative nausea and vomiting)    PT RECEIVED SCOPLAMINE PATCH FOR 01-09-18 SURGERY AND SHE HAD NO N/V   Past Surgical History: Past Surgical History:  Procedure Laterality Date  . CESAREAN SECTION  1986  . CHOLECYSTECTOMY    . CYSTOSCOPY W/ RETROGRADES Right 03/25/2018   Procedure: CYSTOSCOPY WITH RETROGRADE PYELOGRAM;  Surgeon: Hollice Espy, MD;  Location: [facility] ORS;  Service: Urology;  Laterality: Right;  . CYSTOSCOPY W/ URETERAL STENT PLACEMENT Right 03/25/2018   Procedure: CYSTOSCOPY WITH STENT REPLACEMENT;  Surgeon: Hollice Espy, MD;  Location: [facility] ORS;  Service: Urology;  Laterality: Right;  . HERNIA REPAIR    . IR GENERIC HISTORICAL  02/23/2017   IR NEPHROSTOMY PLACEMENT RIGHT 02/23/2017 [name], MD [facility]-INTERV RAD  . IR NEPHROSTOGRAM RIGHT THRU EXISTING ACCESS  02/15/2018  . IR NEPHROSTOMY EXCHANGE RIGHT  04/18/2017  . IR NEPHROSTOMY EXCHANGE RIGHT  06/08/2017  . IR NEPHROSTOMY EXCHANGE RIGHT  08/17/2017  . IR NEPHROSTOMY EXCHANGE RIGHT  10/05/2017  . IR NEPHROSTOMY EXCHANGE RIGHT  12/28/2017  . IR NEPHROSTOMY PLACEMENT RIGHT  06/05/2018  . IR URETERAL STENT PERC REMOVAL MOD SED  06/05/2018  . IR URETERAL STENT PLACEMENT EXISTING ACCESS RIGHT  02/15/2018  . VULVA /PERINEUM BIOPSY N/A 01/09/2018   Procedure: VAGINAL  AND CERVIX BIOPSY;  Surgeon: [name], MD;  Location: [facility] ORS;  Service: Gynecology;  Laterality: N/A;   Family History: Family History  Problem Relation Age of Onset  . Cancer Maternal Uncle   . Cancer Paternal Aunt   . Kidney cancer Neg Hx   . Bladder Cancer Neg Hx    Social History: Social History   Socioeconomic History  . Marital status: Single    Spouse name: Not on file  . Number of children: Not  on file  . Years of education: Not on file  . Highest education level: Not on file  Occupational History  . Not on file  Social Needs  . Financial resource strain: Not on file  . Food insecurity:    Worry: Not on file    Inability: Not on file  . Transportation needs:    Medical: Not on file    Non-medical: Not on file  Tobacco Use  . Smoking status: Former Smoker    Types: Cigarettes    Last attempt to quit: 1990    Years since quitting: 29.8  . Smokeless tobacco: Never Used  Substance and Sexual Activity  . Alcohol use: No  . Drug use: No    Comment: stopped 3 weeks ago  . Sexual activity: Not Currently  Lifestyle  . Physical activity:    Days per week: Not on file    Minutes per session: Not on file  . Stress: Not on file  Relationships  . Social connections:    Talks on phone: Not on file    Gets together: Not on file    Attends religious service: Not on file    Active member of club or organization: Not on file    Attends meetings of clubs or organizations: Not on file    Relationship status: Not  on file  . Intimate partner violence:    Fear of current or ex partner: Not on file    Emotionally abused: Not on file    Physically abused: Not on file    Forced sexual activity: Not on file  Other Topics Concern  . Not on file  Social History Narrative  . Not on file   Allergies: No Known Allergies  Current Medications: Current Outpatient Medications  Medication Sig Dispense Refill  . amLODipine (NORVASC) 2.5 MG tablet Take 1 tablet (2.5 mg total) by mouth daily. (Patient taking differently: Take 2.5 mg by mouth every morning. ) 30 tablet 0  . escitalopram (LEXAPRO) 10 MG tablet TAKE 1 TABLET BY MOUTH EVERY DAY FOR MOOD  1   No current facility-administered medications for this visit.    Review of Systems General:  fatigue Skin: no complaints Eyes: no complaints HEENT: no complaints Breasts: no complaints Pulmonary: no complaints Cardiac: no  complaints Gastrointestinal: abdominal pain at nephrostomy tube site. Constipation (chronic) Genitourinary/Sexual: no complaints Gyn: Pelvic pain (chronic; no worse) Musculoskeletal: back pain at nephrostomy tube site Hematology: no complaints Neurologic/Psych: no complaints  Objective:  Physical Examination:  Today's Vitals   10/09/18 1130  BP: 126/87  Pulse: 82  Resp: 18  Temp: 99 F (37.2 C)  TempSrc: Tympanic  Weight: 257 lb 11.2 oz (116.9 kg)  Height: 5\' 5"  (1.651 m)  PainSc: 4    Body mass index is 42.88 kg/m.  ECOG Performance Status: 1 - Symptomatic but completely ambulatory  GENERAL: Patient is a well appearing female in no acute distress HEENT:  PERRL, neck supple with midline trachea. Thyroid without masses.  NODES:  No cervical, supraclavicular, axillary, or inguinal lymphadenopathy palpated.  LUNGS:  Clear to auscultation bilaterally.  No wheezes or rhonchi. HEART:  Regular rate and rhythm. No murmur appreciated. ABDOMEN:  Soft, nontender.  Positive, normoactive bowel sounds. Well healed surgical scars. MSK:  No focal spinal tenderness to palpation. Full range of motion bilaterally in the upper extremities. EXTREMITIES:  No peripheral edema.   SKIN:  Clear with no obvious rashes or skin changes. No nail dyscrasia. Nephrostomy tubes in place. NEURO:  Nonfocal. Well oriented.  Appropriate affect.  Pelvic: exam chaperoned by nurse;  Vulva: normal appearing vulva with no masses, tenderness or lesions; Vagina: normal vagina visually; Adnexa:tenderness on the right and no masses but limited exam due to habitus; Cervix: Not easily visualized and there may be some agglutination, but not easily opened up with manual dilation; Uterus: not palpable. Adnexa/RV: there is some smooth thickening of the right parametria consistent with radiation effect.  Lab Review Lab Results  Component Value Date   WBC 5.0 08/23/2018   HGB 13.4 08/23/2018   HCT 40.7 08/23/2018   MCV 82.9  08/23/2018   PLT 271 08/23/2018   BMP Latest Ref Rng & Units 08/23/2018 06/05/2018 02/15/2018  Glucose 70 - 99 mg/dL 94 92 92  BUN 6 - 20 mg/dL 24(H) 23(H) 19  Creatinine 0.44 - 1.00 mg/dL 1.39(H) 1.38(H) 1.23(H)  Sodium 135 - 145 mmol/L 138 139 139  Potassium 3.5 - 5.1 mmol/L 4.4 4.3 4.2  Chloride 98 - 111 mmol/L 106 107 108  CO2 22 - 32 mmol/L 25 24 22   Calcium 8.9 - 10.3 mg/dL 9.6 9.0 9.6   Assessment:  Patricia Rojas is a 52 y.o. female diagnosed with stage IIIb cervical squamous cell carcinoma s/p concurrent chemotherapy and radiation 03/2017 followed by vaginal brachytherapy at [facility].  Previously, CT  showed cervical mass extending into the right parametrium with some right ureteral obstruction and hydronephrosis s/p R PCN.  Posttreatment PET concerning for treatment effect versus residual disease, symptoms and exam also concerning.  Endocervical curettage 12/2017 was negative.  EUA on 01/09/2018.  Vaginal biopsy and Tru-Cut right pelvic biopsy negative for malignancy, pathology consistent with inflammation. NED on exam today with normal CT scan 9/19.  Persistent right ureteral stricture with PCN.  Dr Terance Hart planning for repair in 12/19 if she loses some more weight.   Medical co-morbidities complicating care: prior chemoradiation, BMI 42.88, Plan:   Problem List Items Addressed This Visit      Genitourinary   Malignant neoplasm of cervix ([place]) - Primary     She will RTC in 3-4 months for follow up.  Will also follow up with Dr Terance Hart for management of right ureteral stricture probably related to radiation.  He is planning open surgical repair, and think this is reasonable in view of recent reassuring CT scan.    The patient's diagnosis, an outline of the further diagnostic and laboratory studies which will be required, the recommendation, and alternatives were discussed.  All questions were answered to the patient's satisfaction.    [name], MD

## 2019-01-22 ENCOUNTER — Other Ambulatory Visit: Payer: Self-pay

## 2019-01-22 ENCOUNTER — Other Ambulatory Visit: Payer: Self-pay | Admitting: Obstetrics and Gynecology

## 2019-01-22 ENCOUNTER — Other Ambulatory Visit: Payer: Self-pay | Admitting: *Deleted

## 2019-01-22 ENCOUNTER — Encounter: Payer: Self-pay | Admitting: *Deleted

## 2019-01-22 ENCOUNTER — Inpatient Hospital Stay: Payer: Medicaid Other | Attending: Obstetrics and Gynecology | Admitting: Obstetrics and Gynecology

## 2019-01-22 VITALS — BP 129/87 | HR 83 | Temp 97.4°F | Resp 18 | Wt 268.0 lb

## 2019-01-22 DIAGNOSIS — Z923 Personal history of irradiation: Secondary | ICD-10-CM

## 2019-01-22 DIAGNOSIS — Z9221 Personal history of antineoplastic chemotherapy: Secondary | ICD-10-CM

## 2019-01-22 DIAGNOSIS — Z87891 Personal history of nicotine dependence: Secondary | ICD-10-CM | POA: Insufficient documentation

## 2019-01-22 DIAGNOSIS — C539 Malignant neoplasm of cervix uteri, unspecified: Secondary | ICD-10-CM | POA: Insufficient documentation

## 2019-01-22 DIAGNOSIS — C538 Malignant neoplasm of overlapping sites of cervix uteri: Secondary | ICD-10-CM

## 2019-01-22 DIAGNOSIS — R109 Unspecified abdominal pain: Secondary | ICD-10-CM | POA: Insufficient documentation

## 2019-01-22 LAB — URINALYSIS, COMPLETE (UACMP) WITH MICROSCOPIC
BILIRUBIN URINE: NEGATIVE
GLUCOSE, UA: NEGATIVE mg/dL
Ketones, ur: NEGATIVE mg/dL
NITRITE: POSITIVE — AB
Protein, ur: 30 mg/dL — AB
SPECIFIC GRAVITY, URINE: 1.013 (ref 1.005–1.030)
WBC, UA: >50 WBC/hpf — ABNORMAL HIGH (ref 0–5)
pH: 5 (ref 5.0–8.0)

## 2019-01-22 NOTE — Progress Notes (Signed)
PET will be scheduled once approved with insurance. Urine sent from right nephrostomy tube to the lab for U/A and culture. Pap sent to main lab for send out to [facility].

## 2019-01-22 NOTE — Progress Notes (Signed)
Here for follow up. Overall stated she is " tired " denies sob. Pain per pain eval. Pain level now is 6 per pt .

## 2019-01-22 NOTE — Progress Notes (Signed)
Gynecologic Oncology Interval Visit   Referring Provider: Dr Christel Mormon & [name]  Chief Concern: Stage IIIb cervical cancer  Subjective:  Patricia Rojas is a 53 y.o. multiparous female who returns to clinic today for follow up for history of stage IIIb cervical cancer s/p concurrent chemotherapy and XRT 03/2017 followed by vaginal brachytherapy at [facility] with HDR x 5 on 04/20/2017.   Patient last seen in clinic by Dr. Fransisca Connors on 10/09/2018 and was no evidence of disease at that time.  At that time, Reconstruction for ureteral stricture with Dr Terance Hart at [facility] has been delayed due to risk of complications d/t her BMI. Goal is to get down to 230 lbs and then plan for surgery.  she will continue to be managed with PCN with exchanges every 3 months.  Last exchange on 11/21/2018.  Today, she reports pain in right flank that radiates to mid-abdomen. Last urine culture on 08/13/2018 grew > 100,000 colonies of Aerococcus urinae. Says she has been referred to nephrology and is scheduled to see Dr. Ardyth Man 02/24/2019.   Gynecologic Oncology History Patricia Rojas is a pleasant multiparous female diagnosed with stage IIIb cervical cancer who is seen in consultation from ED. Please see prior notes for details.   02/07/17 CT scan shows large cervical mass and and right hydronephrosis. 1. Near circumferential irregular masslike density involving the lower uterus or cervix highly concerning for primary neoplasm. 2. Uterine/cervical mass causes obstruction of the distal right ureter in the pelvis with moderate hydroureteronephrosis. 3. Left external 8 mm iliac node is not enlarged by size criteria, however is indeterminate. 4. Colonic diverticulosis without diverticulitis. 5. Small nodes adjacent to the distal esophagus are of uncertain etiology and significance.   02/07/17 Cervical biopsy invasive squamous cell carcinoma. She was treated with primary chemoradiation. She has completed concurrent chemotherapy and external beam  XRT in April 2018. Brachytherapy at [facility] was completed with HDR x 5 on 04/20/2017.  Required right PCN for ureteral obstruction. 08/12/2017-08/15/2017 [facility] admission for urinary tract infection associated with nephrostomy catheter.  07/25/2017 PET/CT  Impression:  1. Hypermetabolic FDG uptake in the region of the cervix. Post- treatmentchanges may contribute to the increased radiotracer activity, but thedegree of radioactivity is concerning for residual disease.   2. No evidence of FDG avid nodal or distant metastatic disease.  Dr. Christel Mormon saw her on 07/25/2017 and had a negative pelvic exam. He recommended repeat imaging in 3 months at [facility].  This was done two weeks ago and showed persistent decreased size of FDG activity in cervix, but increased SUV max.  She continues to have right PCN and has constant RLQ pain treated with oxycodone.  No bleeding or discharge.   12/19/2017-endocervical curettings DIAGNOSIS:  A. ENDOCERVIX; CURETTAGE:  - ULCERATION, MARKED INFLAMMATION, AND STROMAL CYTOLOGIC CHANGES SUGGESTING RADIATION EFFECT.  - NEGATIVE FOR MALIGNANCY.  12/19/2017 Liquid Pap and HR HPV - Negative for intraepithelial lesion and malignancy -HR HPV negative  Patient seen in GYN oncology clinic by Dr. Fransisca Connors on 12/26/2017.  On exam, vaginal agglutination broken down at apex right adnexal tenderness without masses but exam limited due to body habitus, cervix was firm to palpation with limited mobility and some thickening on the right posterior area.  Digital exam firmness and nodularity of the right cervix thought to possibly extend into parametrium noted.   On 01/09/2018 patient underwent EUA, vaginal/cervical biopsies, and pelvic Tru-Cut biopsies. A. VAGINAL FORNIX, RIGHT; BIOPSY:  - ULCERATION, MARKED INFLAMMATION, AND STROMAL CYTOLOGIC CHANGES CONSISTENT WITH  RADIATION EFFECT.  - NEGATIVE FOR MALIGNANCY.   B. RIGHT PELVIS; TRU-CUT BIOPSY:  - FIBROADIPOSE TISSUE.  - NEGATIVE  FOR MALIGNANCY.   CT abdomen pelvis on 08/23/2018 showed no findings suggestive of metastatic disease in abdomen or pelvis.  Small umbilical hernia containing omental fat, right percutaneous nephrostomy tube placed without evidence of hydroureteronephrosis. Has occasional pulling pain in RLQ.   Past Medical History: Past Medical History:  Diagnosis Date  . Anxiety   . Cervical cancer ([place])    internal rad tx's and chemo tx's.   . Constipation   . Depression   . GERD (gastroesophageal reflux disease)   . Hemorrhoids   . Hypertension   . N&V (nausea and vomiting)   . PONV (postoperative nausea and vomiting)    PT RECEIVED SCOPLAMINE PATCH FOR 01-09-18 SURGERY AND SHE HAD NO N/V   Past Surgical History: Past Surgical History:  Procedure Laterality Date  . CESAREAN SECTION  1986  . CHOLECYSTECTOMY    . CYSTOSCOPY W/ RETROGRADES Right 03/25/2018   Procedure: CYSTOSCOPY WITH RETROGRADE PYELOGRAM;  Surgeon: Hollice Espy, MD;  Location: [facility] ORS;  Service: Urology;  Laterality: Right;  . CYSTOSCOPY W/ URETERAL STENT PLACEMENT Right 03/25/2018   Procedure: CYSTOSCOPY WITH STENT REPLACEMENT;  Surgeon: Hollice Espy, MD;  Location: [facility] ORS;  Service: Urology;  Laterality: Right;  . HERNIA REPAIR    . IR GENERIC HISTORICAL  02/23/2017   IR NEPHROSTOMY PLACEMENT RIGHT 02/23/2017 [name], MD [facility]-INTERV RAD  . IR NEPHROSTOGRAM RIGHT THRU EXISTING ACCESS  02/15/2018  . IR NEPHROSTOMY EXCHANGE RIGHT  04/18/2017  . IR NEPHROSTOMY EXCHANGE RIGHT  06/08/2017  . IR NEPHROSTOMY EXCHANGE RIGHT  08/17/2017  . IR NEPHROSTOMY EXCHANGE RIGHT  10/05/2017  . IR NEPHROSTOMY EXCHANGE RIGHT  12/28/2017  . IR NEPHROSTOMY PLACEMENT RIGHT  06/05/2018  . IR URETERAL STENT PERC REMOVAL MOD SED  06/05/2018  . IR URETERAL STENT PLACEMENT EXISTING ACCESS RIGHT  02/15/2018  . VULVA /PERINEUM BIOPSY N/A 01/09/2018   Procedure: VAGINAL  AND CERVIX BIOPSY;  Surgeon: [name], MD;  Location: [facility] ORS;  Service:  Gynecology;  Laterality: N/A;   Family History: Family History  Problem Relation Age of Onset  . Cancer Maternal Uncle   . Cancer Paternal Aunt   . Kidney cancer Neg Hx   . Bladder Cancer Neg Hx    Social History: Social History   Socioeconomic History  . Marital status: Single    Spouse name: Not on file  . Number of children: Not on file  . Years of education: Not on file  . Highest education level: Not on file  Occupational History  . Not on file  Social Needs  . Financial resource strain: Not on file  . Food insecurity:    Worry: Not on file    Inability: Not on file  . Transportation needs:    Medical: Not on file    Non-medical: Not on file  Tobacco Use  . Smoking status: Former Smoker    Types: Cigarettes    Last attempt to quit: 1990    Years since quitting: 30.1  . Smokeless tobacco: Never Used  Substance and Sexual Activity  . Alcohol use: No  . Drug use: No    Comment: stopped 3 weeks ago  . Sexual activity: Not Currently  Lifestyle  . Physical activity:    Days per week: Not on file    Minutes per session: Not on file  . Stress: Not on file  Relationships  . Social connections:    Talks on phone: Not on file    Gets together: Not on file    Attends religious service: Not on file    Active member of club or organization: Not on file    Attends meetings of clubs or organizations: Not on file    Relationship status: Not on file  . Intimate partner violence:    Fear of current or ex partner: Not on file    Emotionally abused: Not on file    Physically abused: Not on file    Forced sexual activity: Not on file  Other Topics Concern  . Not on file  Social History Narrative  . Not on file   Allergies: No Known Allergies  Current Medications: Current Outpatient Medications  Medication Sig Dispense Refill  . amLODipine (NORVASC) 2.5 MG tablet Take 1 tablet (2.5 mg total) by mouth daily. (Patient taking differently: Take 2.5 mg by mouth every  morning. ) 30 tablet 0  . sertraline (ZOLOFT) 50 MG tablet Take 50 mg by mouth daily.    . promethazine (PHENERGAN) 25 MG tablet TAKE 1 TABLET (25 MG TOTAL) BY MOUTH EVERY 6 (SIX) HOURS AS NEEDED FOR NAUSEA OR VOMITING.     No current facility-administered medications for this visit.    Review of Systems General:  Fatigue, weakness Skin: no complaints Eyes: no complaints HEENT: no complaints Breasts: no complaints Pulmonary: no complaints Cardiac: no complaints Gastrointestinal: abdominal pain, abdominal bloating/distention Genitourinary/Sexual: problems with bladder function Ob/Gyn: pelvic pain Musculoskeletal: back pain Hematology: no complaints Neurologic/Psych: no complaints   Objective:  Physical Examination:  Today's Vitals   01/22/19 1322  BP: 129/87  Pulse: 83  Resp: 18  Temp: (!) 97.4 F (36.3 C)  TempSrc: Tympanic  Weight: 268 lb (121.6 kg)   Body mass index is 44.6 kg/m.  ECOG Performance Status: 1 - Symptomatic but completely ambulatory  GENERAL: Patient is a well appearing female in no acute distress HEENT:  PERRL, neck supple with midline trachea. Thyroid without masses.  NODES:  No cervical, supraclavicular, axillary, or inguinal lymphadenopathy palpated.  LUNGS:  Clear to auscultation bilaterally.  No wheezes or rhonchi. HEART:  Regular rate and rhythm. No murmur appreciated. ABDOMEN:  Soft, nontender.  Positive, normoactive bowel sounds. Well healed surgical scars. MSK:  No focal spinal tenderness to palpation. Full range of motion bilaterally in the upper extremities. EXTREMITIES:  No peripheral edema.   SKIN:  Clear with no obvious rashes or skin changes. No nail dyscrasia. Nephrostomy tubes in place. NEURO:  Nonfocal. Well oriented.  Appropriate affect.  Pelvic: exam chaperoned by nurse;  Vulva: normal appearing vulva with no masses, tenderness or lesions; Vagina: normal vagina visually; Cervix: Not visualized and the upper vagina is agglutinated;   Uterus: not palpable due to habitus. Adnexa:tenderness on the right and no masses but limited exam due to habitus; RV, confirmed and there is some smooth thickening of the right parametria consistent with radiation effect which is stable compared to prior.  Lab Review Lab Results  Component Value Date   WBC 5.0 08/23/2018   HGB 13.4 08/23/2018   HCT 40.7 08/23/2018   MCV 82.9 08/23/2018   PLT 271 08/23/2018   BMP Latest Ref Rng & Units 08/23/2018 06/05/2018 02/15/2018  Glucose 70 - 99 mg/dL 94 92 92  BUN 6 - 20 mg/dL 24(H) 23(H) 19  Creatinine 0.44 - 1.00 mg/dL 1.39(H) 1.38(H) 1.23(H)  Sodium 135 - 145 mmol/L 138 139 139  Potassium 3.5 - 5.1 mmol/L 4.4 4.3 4.2  Chloride 98 - 111 mmol/L 106 107 108  CO2 22 - 32 mmol/L 25 24 22   Calcium 8.9 - 10.3 mg/dL 9.6 9.0 9.6   Assessment:  Patricia Rojas is a 53 y.o. female diagnosed with stage IIIb cervical squamous cell carcinoma s/p concurrent chemotherapy and radiation 03/2017 followed by vaginal brachytherapy at [facility].  Previously, CT showed cervical mass extending into the right parametrium with some right ureteral obstruction and hydronephrosis s/p R PCN.  Posttreatment PET concerning for treatment effect versus residual disease, symptoms and exam also concerning.  Endocervical curettage 12/2017 was negative.  EUA on 01/09/2018.  Vaginal biopsy and Tru-Cut right pelvic biopsy negative for malignancy, pathology consistent with inflammation. Normal CT scan 9/19. Symptoms concerning for abdominal/pelvic pain and pelvic exam is tender, differential diagnosis included disease progression versus UTI.   Persistent right ureteral stricture with PCN.  Dr Terance Hart planning for repair in 12/19 if she loses some more weight.   Medical co-morbidities complicating care: prior chemoradiation, BMI 42.88, Plan:   Problem List Items Addressed This Visit      Genitourinary   Malignant neoplasm of cervix ([place]) - Primary     Symptoms concerning for abdominal/pelvic  pain and pelvic exam is tender, differential diagnosis included disease progression versus UTI. We will obtain imaging (PET/CT) to assess for recurrence as well as UA and urine culture with sensitivity.   She will RTC in 3-4 months for follow up.    She will also follow up with Dr Terance Hart for management of right ureteral stricture probably related to radiation. Would only recommend proceeding if imaging negative for disease. Of note given the issues she has had with the PCN she may opt to remove the PCN and lose right kidney function.    I personally had a face to face interaction and evaluated the patient jointly with the NP, Ms. Beckey Rutter.  I have reviewed her history and available records and have performed the key portions of the physical exam including HEENT, general, lymph node survey, abdominal exam, pelvic exam with my findings confirming those documented above by the APP.  I have discussed the case with the APP and the patient.  I agree with the above documentation, assessment and plan which was fully formulated by me.  Counseling was completed by me.   I personally saw the patient and performed a substantive portion of this encounter in conjunction with the listed APP as documented above.  A total of 25 minutes were spent with the patient/family today; >50% was spent in education, counseling and coordination of care for cervical cancer.   Ang[place] Gaetana Michaelis, MD

## 2019-01-22 NOTE — Patient Instructions (Signed)
We will arrange PET scan and schedule follow up for results. I will call you with both of these appointments

## 2019-01-23 ENCOUNTER — Telehealth: Payer: Self-pay

## 2019-01-23 NOTE — Telephone Encounter (Signed)
Results of U/A and Cx sent to Dr. Rolanda Lundborg and Lilia Argue PA at [facility] at request of Dr. Theora Gianotti to send to her urologist.

## 2019-01-25 LAB — URINE CULTURE

## 2019-01-27 ENCOUNTER — Telehealth: Payer: Self-pay

## 2019-01-27 NOTE — Telephone Encounter (Signed)
Final urine culture result sent to Lilia Argue PA, urology at [facility], for review and management.

## 2019-01-29 ENCOUNTER — Telehealth: Payer: Self-pay

## 2019-01-29 LAB — PAP LB AND HPV HIGH-RISK: HPV, high-risk: NEGATIVE

## 2019-01-29 NOTE — Telephone Encounter (Signed)
[facility] has called in script for Augmentin, 01/24/19. Pet was denied by insurance. Per Dr. Theora Gianotti we can order the CT per insurance guidelines. [name] has a CT already scheduled for March. I have spoken to [place] and we can move CT to next week.

## 2019-01-30 ENCOUNTER — Other Ambulatory Visit: Payer: Self-pay | Admitting: *Deleted

## 2019-01-30 DIAGNOSIS — C538 Malignant neoplasm of overlapping sites of cervix uteri: Secondary | ICD-10-CM

## 2019-01-31 ENCOUNTER — Telehealth: Payer: Self-pay

## 2019-01-31 NOTE — Telephone Encounter (Signed)
Second voicemail left with Patricia Rojas to return call for CT appointment details. First message left 01/30/19. Her pap smear results are also back and would like to give her those results.  NEGATIVE FOR INTRAEPITHELIAL LESION OR MALIGNANCY HPV Negative     Oncology Nurse Navigator Documentation  Navigator Location: [facility]-Med Onc (01/31/19 1100)   )Navigator Encounter Type: Telephone (01/31/19 1100) Telephone: Patricia Rojas Call;Appt Confirmation/Clarification;Diagnostic Results (01/31/19 1100)                   Patient Visit Type: GynOnc;MedOnc (01/31/19 1100)                              Time Spent with Patient: 15 (01/31/19 1100)

## 2019-02-03 ENCOUNTER — Telehealth: Payer: Self-pay

## 2019-02-03 NOTE — Telephone Encounter (Signed)
Received voicemail from Ms. Medley. She received her updated appointments via my chart.  Pap results on her my chart (when released).

## 2019-02-06 ENCOUNTER — Inpatient Hospital Stay: Payer: Medicaid Other

## 2019-02-06 ENCOUNTER — Ambulatory Visit
Admission: RE | Admit: 2019-02-06 | Discharge: 2019-02-06 | Disposition: A | Discharge status: Home / Self Care | Payer: Medicaid Other | Source: Ambulatory Visit | Attending: Oncology | Admitting: Oncology

## 2019-02-06 DIAGNOSIS — C538 Malignant neoplasm of overlapping sites of cervix uteri: Secondary | ICD-10-CM

## 2019-02-06 DIAGNOSIS — C539 Malignant neoplasm of cervix uteri, unspecified: Secondary | ICD-10-CM | POA: Diagnosis not present

## 2019-02-06 LAB — BASIC METABOLIC PANEL
Anion gap: 7 (ref 5–15)
BUN: 24 mg/dL — ABNORMAL HIGH (ref 6–20)
CALCIUM: 9.1 mg/dL (ref 8.9–10.3)
CO2: 23 mmol/L (ref 22–32)
Chloride: 106 mmol/L (ref 98–111)
Creatinine, Ser: 1.47 mg/dL — ABNORMAL HIGH (ref 0.44–1.00)
GFR calc Af Amer: 47 mL/min — ABNORMAL LOW (ref >60)
GFR calc non Af Amer: 41 mL/min — ABNORMAL LOW (ref >60)
GLUCOSE: 95 mg/dL (ref 70–99)
Potassium: 3.9 mmol/L (ref 3.5–5.1)
Sodium: 136 mmol/L (ref 135–145)

## 2019-02-06 LAB — CBC WITH DIFFERENTIAL/PLATELET
Abs Immature Granulocytes: 0 10*3/uL (ref 0.00–0.07)
Basophils Absolute: 0 10*3/uL (ref 0.0–0.1)
Basophils Relative: 1 %
EOS PCT: 5 %
Eosinophils Absolute: 0.3 10*3/uL (ref 0.0–0.5)
HCT: 42 % (ref 36.0–46.0)
Hemoglobin: 13.6 g/dL (ref 12.0–15.0)
Immature Granulocytes: 0 %
Lymphocytes Relative: 25 %
Lymphs Abs: 1.5 10*3/uL (ref 0.7–4.0)
MCH: 26.8 pg (ref 26.0–34.0)
MCHC: 32.4 g/dL (ref 30.0–36.0)
MCV: 82.7 fL (ref 80.0–100.0)
Monocytes Absolute: 0.7 10*3/uL (ref 0.1–1.0)
Monocytes Relative: 10 %
Neutro Abs: 3.7 10*3/uL (ref 1.7–7.7)
Neutrophils Relative %: 59 %
Platelets: 248 10*3/uL (ref 150–400)
RBC: 5.08 MIL/uL (ref 3.87–5.11)
RDW: 14 % (ref 11.5–15.5)
WBC: 6.3 10*3/uL (ref 4.0–10.5)
nRBC: 0 % (ref 0.0–0.2)

## 2019-02-06 MED ORDER — IOHEXOL 300 MG/ML  SOLN
75.0000 mL | Freq: Once | INTRAMUSCULAR | Status: AC | PRN
  Administered 2019-02-06: 75 mL via INTRAVENOUS

## 2019-02-10 NOTE — Progress Notes (Signed)
Patricia Rojas  Telephone:(336) [phone] Fax:(336) [phone]  ID: Patricia Rojas OB: 1966-07-24  MR#: [id number]  WCB#:[id number]  Patient Care Team: [name], MD as PCP - General (Infectious Diseases) Clent Jacks, RN as Registered Nurse  CHIEF COMPLAINT: Squamous cell carcinoma of the cervix.  INTERVAL HISTORY: Patient returns to clinic today for further evaluation and discussion of her imaging results.  She continues to have repeated infections and issues with her nephrostomy tube.  She also has chronic pelvic pain.  She otherwise feels well.  She has no neurologic complaints. She denies any recent fevers or illnesses. She has no chest pain or shortness of breath.  She denies any nausea, vomiting, constipation, or diarrhea.  She has no urinary complaints.  Patient offers no further specific complaints today.  REVIEW OF SYSTEMS:   Review of Systems  Constitutional: Negative.  Negative for fever, malaise/fatigue and weight loss.  Respiratory: Negative.  Negative for cough and shortness of breath.   Cardiovascular: Negative.  Negative for chest pain and leg swelling.  Gastrointestinal: Negative.  Negative for abdominal pain, constipation, diarrhea and nausea.  Genitourinary: Negative.  Negative for flank pain.  Musculoskeletal: Negative.  Negative for back pain.  Skin: Negative.  Negative for rash.  Neurological: Negative.  Negative for sensory change, focal weakness, weakness and headaches.  Psychiatric/Behavioral: Negative.  Negative for depression. The patient is not nervous/anxious.     As per HPI. Otherwise, a complete review of systems is negative.  PAST MEDICAL HISTORY: Past Medical History:  Diagnosis Date  . Anxiety   . Cervical cancer ([place]) 2018   internal rad tx's and chemo tx's.   . Constipation   . Depression   . GERD (gastroesophageal reflux disease)   . Hemorrhoids   . Hypertension   . N&V (nausea and vomiting)   . PONV (postoperative  nausea and vomiting)    PT RECEIVED SCOPLAMINE PATCH FOR 01-09-18 SURGERY AND SHE HAD NO N/V    PAST SURGICAL HISTORY: Past Surgical History:  Procedure Laterality Date  . CESAREAN SECTION  1986  . CHOLECYSTECTOMY    . CYSTOSCOPY W/ RETROGRADES Right 03/25/2018   Procedure: CYSTOSCOPY WITH RETROGRADE PYELOGRAM;  Surgeon: Hollice Espy, MD;  Location: [facility] ORS;  Service: Urology;  Laterality: Right;  . CYSTOSCOPY W/ URETERAL STENT PLACEMENT Right 03/25/2018   Procedure: CYSTOSCOPY WITH STENT REPLACEMENT;  Surgeon: Hollice Espy, MD;  Location: [facility];  Service: Urology;  Laterality: Right;  . HERNIA REPAIR    . IR GENERIC HISTORICAL  02/23/2017   IR NEPHROSTOMY PLACEMENT RIGHT 02/23/2017 [name], MD [facility]  . IR NEPHROSTOGRAM RIGHT THRU EXISTING ACCESS  02/15/2018  . IR NEPHROSTOMY EXCHANGE RIGHT  04/18/2017  . IR NEPHROSTOMY EXCHANGE RIGHT  06/08/2017  . IR NEPHROSTOMY EXCHANGE RIGHT  08/17/2017  . IR NEPHROSTOMY EXCHANGE RIGHT  10/05/2017  . IR NEPHROSTOMY EXCHANGE RIGHT  12/28/2017  . IR NEPHROSTOMY PLACEMENT RIGHT  06/05/2018  . IR URETERAL STENT PERC REMOVAL MOD SED  06/05/2018  . IR URETERAL STENT PLACEMENT EXISTING ACCESS RIGHT  02/15/2018  . VULVA /PERINEUM BIOPSY N/A 01/09/2018   Procedure: VAGINAL  AND CERVIX BIOPSY;  Surgeon: [name], MD;  Location: [facility] OR[facility];  Service: Gynecology;  Laterality: N/A;    FAMILY HISTORY: Family History  Problem Relation Age of Onset  . Cancer Maternal Uncle   . Cancer Paternal Aunt   . Kidney cancer Neg Hx   . Bladder Cancer Neg Hx     ADVANCED  DIRECTIVES (Y/N):  N  HEALTH MAINTENANCE: Social History   Tobacco Use  . Smoking status: Former Smoker    Types: Cigarettes    Last attempt to quit: 1990    Years since quitting: 30.1  . Smokeless tobacco: Never Used  Substance Use Topics  . Alcohol use: No  . Drug use: No    Comment: stopped 3 weeks ago     Colonoscopy:  PAP:  Bone density:  Lipid panel:  No  Known Allergies  Current Outpatient Medications  Medication Sig Dispense Refill  . amLODipine (NORVASC) 2.5 MG tablet Take 1 tablet (2.5 mg total) by mouth daily. (Patient taking differently: Take 2.5 mg by mouth every Rojas. ) 30 tablet 0  . promethazine (PHENERGAN) 25 MG tablet TAKE 1 TABLET (25 MG TOTAL) BY MOUTH EVERY 6 (SIX) HOURS AS NEEDED FOR NAUSEA OR VOMITING.    . sertraline (ZOLOFT) 50 MG tablet Take 50 mg by mouth daily.     No current facility-administered medications for this visit.     OBJECTIVE: Vitals:   02/12/19 1012  BP: 129/87  Pulse: 83  Temp: 97.9 F (36.6 C)     Body mass index is 44.6 kg/m.    ECOG FS:0 - Asymptomatic  General: Well-developed, well-nourished, no acute distress. Eyes: Pink conjunctiva, anicteric sclera. HEENT: Normocephalic, moist mucous membranes. Lungs: Clear to auscultation bilaterally. Heart: Regular rate and rhythm. No rubs, murmurs, or gallops. Abdomen: Soft, nontender, nondistended. No organomegaly noted, normoactive bowel sounds. Musculoskeletal: No edema, cyanosis, or clubbing. Neuro: Alert, answering all questions appropriately. Cranial nerves grossly intact. Skin: No rashes or petechiae noted. Psych: Normal affect.  LAB RESULTS:  Lab Results  Component Value Date   NA 136 02/06/2019   K 3.9 02/06/2019   CL 106 02/06/2019   CO2 23 02/06/2019   GLUCOSE 95 02/06/2019   BUN 24 (H) 02/06/2019   CREATININE 1.47 (H) 02/06/2019   CALCIUM 9.1 02/06/2019   PROT 8.1 08/23/2018   ALBUMIN 4.3 08/23/2018   AST 24 08/23/2018   ALT 16 08/23/2018   ALKPHOS 100 08/23/2018   BILITOT 0.8 08/23/2018   GFRNONAA 41 (L) 02/06/2019   GFRAA 47 (L) 02/06/2019    Lab Results  Component Value Date   WBC 6.3 02/06/2019   NEUTROABS 3.7 02/06/2019   HGB 13.6 02/06/2019   HCT 42.0 02/06/2019   MCV 82.7 02/06/2019   PLT 248 02/06/2019     STUDIES: Ct Abdomen Pelvis W Contrast  Result Date: 02/06/2019 CLINICAL DATA:  Cervical  cancer. EXAM: CT ABDOMEN AND PELVIS WITH CONTRAST TECHNIQUE: Multidetector CT imaging of the abdomen and pelvis was performed using the standard protocol following bolus administration of intravenous contrast. CONTRAST:  44m OMNIPAQUE IOHEXOL 300 MG/ML  SOLN COMPARISON:  08/23/2018 FINDINGS: Lower chest: Unremarkable Hepatobiliary: No suspicious focal abnormality within the liver parenchyma. Gallbladder surgically absent. No intrahepatic or extrahepatic biliary dilation. Pancreas: No focal mass lesion. No dilatation of the main duct. No intraparenchymal cyst. No peripancreatic edema. Spleen: No splenomegaly. No focal mass lesion. Adrenals/Urinary Tract: No adrenal nodule or mass. Similar appearance right percutaneous nephrostomy tube. Left kidney unremarkable No evidence for hydroureter. The urinary bladder appears normal for the degree of distention. Stomach/Bowel: Stomach is unremarkable. No gastric wall thickening. No evidence of outlet obstruction. Duodenum is normally positioned as is the ligament of Treitz. No small bowel wall thickening. No small bowel dilatation. 3 cm segment of small bowel intussusception identified in the left abdomen (image 41/2) without proximal small bowel  dilatation or associated perienteric edema/inflammation. No small bowel wall thickening. The terminal ileum is normal. The appendix is normal. No gross colonic mass. No colonic wall thickening. Diverticuli are seen scattered along the entire length of the colon without CT findings of diverticulitis. Vascular/Lymphatic: There is abdominal aortic atherosclerosis without aneurysm. There is no gastrohepatic or hepatoduodenal ligament lymphadenopathy. No intraperitoneal or retroperitoneal lymphadenopathy. No pelvic sidewall lymphadenopathy. Reproductive: The uterus is unremarkable.  There is no adnexal mass. Other: No intraperitoneal free fluid. Musculoskeletal: Small umbilical hernia contains only fat. Pelvic floor laxity evident. No  worrisome lytic or sclerotic osseous abnormality. IMPRESSION: 1. Stable exam. No new or progressive interval findings to suggest recurrent or metastatic disease. 2. Short segment small bowel intussusception left abdomen without complicating features. This is probably a transient process of no clinical significance. If the patient develops symptoms referable to this region, follow-up CT could be performed to further evaluate. 3. Stable right percutaneous nephrostomy tube. 4.  Aortic Atherosclerois (ICD10-170.0) Electronically Signed   By: Misty Stanley M.D.   On: 02/06/2019 18:50    ASSESSMENT: Squamous cell carcinoma of the cervix, PDL-1 13%.  PLAN:    1. Squamous cell carcinoma of the cervix: Patient completed concurrent chemotherapy and XRT in April 2018. Brachytherapy at [facility] was completed shortly thereafter.  CT scan results from February 06, 2019 reviewed independently and report as above with no obvious evidence of recurrent or metastatic disease.  No intervention is needed at this time.  Continue follow-up with gynecology oncology as scheduled.  Return to clinic in 6 months with repeat imaging and further evaluation.  2.  Ureteral stricture: Patient continues to have nephrostomy tube.  Continue evaluation and treatment per urology.   3.  Pain: Chronic.  Patient is no longer taking narcotics. 4.  Renal insufficiency: Nephrostomy tube in place.  Creatinine is 1.47 today which is approximately her baseline.   Patient expressed understanding and was in agreement with this plan. She also understands that She can call clinic at any time with any questions, concerns, or complaints.   Cancer Staging Malignant neoplasm of cervix [facility]) Staging form: Corpus Uteri - Carcinoma and Carcinosarcoma, AJCC 8th Edition - Clinical stage from 02/18/2017: FIGO Stage IIIC1 (cT3b, cN1, cM0) - Signed by Lloyd Huger, MD on 02/18/2017   Lloyd Huger, MD   02/13/2019 11:08 AM

## 2019-02-12 ENCOUNTER — Inpatient Hospital Stay: Payer: Medicaid Other | Attending: Oncology | Admitting: Oncology

## 2019-02-12 ENCOUNTER — Other Ambulatory Visit: Payer: Self-pay

## 2019-02-12 ENCOUNTER — Encounter: Payer: Self-pay | Admitting: Oncology

## 2019-02-12 VITALS — BP 129/87 | HR 83 | Temp 97.9°F | Ht 65.0 in | Wt 268.0 lb

## 2019-02-12 DIAGNOSIS — R109 Unspecified abdominal pain: Secondary | ICD-10-CM | POA: Diagnosis not present

## 2019-02-12 DIAGNOSIS — Z936 Other artificial openings of urinary tract status: Secondary | ICD-10-CM | POA: Diagnosis not present

## 2019-02-12 DIAGNOSIS — Z87891 Personal history of nicotine dependence: Secondary | ICD-10-CM | POA: Diagnosis not present

## 2019-02-12 DIAGNOSIS — R102 Pelvic and perineal pain: Secondary | ICD-10-CM | POA: Diagnosis not present

## 2019-02-12 DIAGNOSIS — G8929 Other chronic pain: Secondary | ICD-10-CM | POA: Diagnosis not present

## 2019-02-12 DIAGNOSIS — N289 Disorder of kidney and ureter, unspecified: Secondary | ICD-10-CM | POA: Diagnosis not present

## 2019-02-12 DIAGNOSIS — C538 Malignant neoplasm of overlapping sites of cervix uteri: Secondary | ICD-10-CM | POA: Diagnosis not present

## 2019-02-12 NOTE — Progress Notes (Signed)
Patient is here today to follow up on her Malignant neoplasm of overlapping sites of cervix. Patient stated that she continues to have pain on her right lower quadrant and right flank pain. However, patient only takes Tylenol as needed since pain medications made her constipated and made her feel much worse.

## 2019-02-18 ENCOUNTER — Other Ambulatory Visit: Payer: Medicaid Other

## 2019-02-18 ENCOUNTER — Ambulatory Visit: Payer: Medicaid Other

## 2019-02-25 ENCOUNTER — Ambulatory Visit: Payer: Medicaid Other | Admitting: Oncology

## 2019-03-17 ENCOUNTER — Telehealth: Payer: Self-pay

## 2019-03-17 ENCOUNTER — Other Ambulatory Visit: Payer: Self-pay | Admitting: *Deleted

## 2019-03-17 MED ORDER — SULFAMETHOXAZOLE-TRIMETHOPRIM 800-160 MG PO TABS: 1.0000 | ORAL_TABLET | Freq: Two times a day (BID) | ORAL | 0 refills | Status: DC

## 2019-03-17 NOTE — Telephone Encounter (Signed)
Called and notified Patricia Rojas that antibiotic was sent in. Educated on med and dosage. Instructed her to call us back if she does not improve. Verbalized understanding.

## 2019-03-17 NOTE — Telephone Encounter (Signed)
Sure, baring any allergies she can have bactrim BID x5 days.

## 2019-03-17 NOTE — Telephone Encounter (Signed)
Received voicemail from Ms. Fairclough. She feels like she is having recurrent urinary infection from nephrostomy tube. She is asking if anyone can prescribe her an antibiotic. All of her upcoming appointments with other providers have been cancelled due to pandemic.

## 2019-03-17 NOTE — Telephone Encounter (Signed)
Prescription sent to patient's pharmacy.

## 2019-09-08 ENCOUNTER — Other Ambulatory Visit: Payer: Self-pay

## 2019-09-08 ENCOUNTER — Inpatient Hospital Stay: Payer: Medicare Other | Attending: Oncology

## 2019-09-08 ENCOUNTER — Ambulatory Visit
Admission: RE | Admit: 2019-09-08 | Discharge: 2019-09-08 | Disposition: A | Discharge status: Home / Self Care | Payer: Medicare Other | Source: Ambulatory Visit | Attending: Oncology | Admitting: Oncology

## 2019-09-08 DIAGNOSIS — C538 Malignant neoplasm of overlapping sites of cervix uteri: Secondary | ICD-10-CM | POA: Insufficient documentation

## 2019-09-08 LAB — CBC WITH DIFFERENTIAL/PLATELET
Abs Immature Granulocytes: 0.01 10*3/uL (ref 0.00–0.07)
Basophils Absolute: 0.1 10*3/uL (ref 0.0–0.1)
Basophils Relative: 1 %
Eosinophils Absolute: 0.5 10*3/uL (ref 0.0–0.5)
Eosinophils Relative: 8 %
HCT: 41.1 % (ref 36.0–46.0)
Hemoglobin: 12.9 g/dL (ref 12.0–15.0)
Immature Granulocytes: 0 %
Lymphocytes Relative: 27 %
Lymphs Abs: 1.5 10*3/uL (ref 0.7–4.0)
MCH: 26.2 pg (ref 26.0–34.0)
MCHC: 31.4 g/dL (ref 30.0–36.0)
MCV: 83.4 fL (ref 80.0–100.0)
Monocytes Absolute: 0.5 10*3/uL (ref 0.1–1.0)
Monocytes Relative: 9 %
Neutro Abs: 3.2 10*3/uL (ref 1.7–7.7)
Neutrophils Relative %: 55 %
Platelets: 251 10*3/uL (ref 150–400)
RBC: 4.93 MIL/uL (ref 3.87–5.11)
RDW: 14.5 % (ref 11.5–15.5)
WBC: 5.7 10*3/uL (ref 4.0–10.5)
nRBC: 0 % (ref 0.0–0.2)

## 2019-09-08 LAB — BASIC METABOLIC PANEL
Anion gap: 3 — ABNORMAL LOW (ref 5–15)
BUN: 21 mg/dL — ABNORMAL HIGH (ref 6–20)
CO2: 23 mmol/L (ref 22–32)
Calcium: 8.6 mg/dL — ABNORMAL LOW (ref 8.9–10.3)
Chloride: 108 mmol/L (ref 98–111)
Creatinine, Ser: 1.44 mg/dL — ABNORMAL HIGH (ref 0.44–1.00)
GFR calc Af Amer: 48 mL/min — ABNORMAL LOW (ref >60)
GFR calc non Af Amer: 41 mL/min — ABNORMAL LOW (ref >60)
Glucose, Bld: 92 mg/dL (ref 70–99)
Potassium: 4.2 mmol/L (ref 3.5–5.1)
Sodium: 134 mmol/L — ABNORMAL LOW (ref 135–145)

## 2019-09-08 MED ORDER — IOHEXOL 300 MG/ML  SOLN
75.0000 mL | Freq: Once | INTRAMUSCULAR | Status: AC | PRN
  Administered 2019-09-08: 11:00:00 75 mL via INTRAVENOUS

## 2019-09-09 ENCOUNTER — Ambulatory Visit: Payer: Medicaid Other | Admitting: Oncology

## 2019-09-16 ENCOUNTER — Ambulatory Visit: Payer: Medicaid Other | Admitting: Oncology

## 2019-09-19 ENCOUNTER — Ambulatory Visit: Payer: Medicaid Other | Admitting: Oncology

## 2019-09-22 ENCOUNTER — Encounter: Payer: Self-pay | Admitting: Oncology

## 2019-09-22 ENCOUNTER — Other Ambulatory Visit: Payer: Self-pay

## 2019-09-22 NOTE — Progress Notes (Signed)
Patricia Rojas  Telephone:(336) [phone] Fax:(336) [phone]  ID: Patricia Rojas OB: [birth year]  MR#: [id number]  UUE#:[id number]  Patient Care Team: [name], MD as PCP - General (Infectious Diseases) Clent Jacks, RN as Registered Nurse  CHIEF COMPLAINT: Squamous cell carcinoma of the cervix.  INTERVAL HISTORY: Patient returns to clinic today for further evaluation and discussion of her imaging results.  She continues to have tenderness at the site of her nephrostomy tube, but otherwise feels well.  She has no neurologic complaints. She denies any recent fevers or illnesses.  She denies any chest pain, shortness of breath, cough, or hemoptysis.  She denies any nausea, vomiting, constipation, or diarrhea.  She has no urinary complaints.  Patient offers no further specific complaints today.  REVIEW OF SYSTEMS:   Review of Systems  Constitutional: Negative.  Negative for fever, malaise/fatigue and weight loss.  Respiratory: Negative.  Negative for cough and shortness of breath.   Cardiovascular: Negative.  Negative for chest pain and leg swelling.  Gastrointestinal: Negative.  Negative for abdominal pain, constipation, diarrhea and nausea.  Genitourinary: Negative.  Negative for flank pain.  Musculoskeletal: Negative.  Negative for back pain.  Skin: Negative.  Negative for rash.  Neurological: Negative.  Negative for sensory change, focal weakness, weakness and headaches.  Psychiatric/Behavioral: Negative.  Negative for depression. The patient is not nervous/anxious.     As per HPI. Otherwise, a complete review of systems is negative.  PAST MEDICAL HISTORY: Past Medical History:  Diagnosis Date   Anxiety    Cervical cancer ([place]) 2018   internal rad tx's and chemo tx's.    Constipation    Depression    GERD (gastroesophageal reflux disease)    Hemorrhoids    Hypertension    N&V (nausea and vomiting)    PONV (postoperative nausea and vomiting)     PT RECEIVED SCOPLAMINE PATCH FOR 01-09-18 SURGERY AND SHE HAD NO N/V    PAST SURGICAL HISTORY: Past Surgical History:  Procedure Laterality Date   CESAREAN SECTION  1986   CHOLECYSTECTOMY     CYSTOSCOPY W/ RETROGRADES Right 03/25/2018   Procedure: CYSTOSCOPY WITH RETROGRADE PYELOGRAM;  Surgeon: Hollice Espy, MD;  Location: [facility];  Service: Urology;  Laterality: Right;   CYSTOSCOPY W/ URETERAL STENT PLACEMENT Right 03/25/2018   Procedure: CYSTOSCOPY WITH STENT REPLACEMENT;  Surgeon: Hollice Espy, MD;  Location: [facility];  Service: Urology;  Laterality: Right;   HERNIA REPAIR     IR GENERIC HISTORICAL  02/23/2017   IR NEPHROSTOMY PLACEMENT RIGHT 02/23/2017 [name], MD [facility]   IR NEPHROSTOGRAM RIGHT THRU EXISTING ACCESS  02/15/2018   IR NEPHROSTOMY EXCHANGE RIGHT  04/18/2017   IR NEPHROSTOMY EXCHANGE RIGHT  06/08/2017   IR NEPHROSTOMY EXCHANGE RIGHT  08/17/2017   IR NEPHROSTOMY EXCHANGE RIGHT  10/05/2017   IR NEPHROSTOMY EXCHANGE RIGHT  12/28/2017   IR NEPHROSTOMY PLACEMENT RIGHT  06/05/2018   IR URETERAL STENT PERC REMOVAL MOD SED  06/05/2018   IR URETERAL STENT PLACEMENT EXISTING ACCESS RIGHT  02/15/2018   VULVA Milagros Loll BIOPSY N/A 01/09/2018   Procedure: VAGINAL  AND CERVIX BIOPSY;  Surgeon: [name], MD;  Location: [facility] OR[facility];  Service: Gynecology;  Laterality: N/A;    FAMILY HISTORY: Family History  Problem Relation Age of Onset   Cancer Maternal Uncle    Cancer Paternal Aunt    Kidney cancer Neg Hx    Bladder Cancer Neg Hx     ADVANCED DIRECTIVES (Y/N):  N  HEALTH MAINTENANCE: Social History   Tobacco Use   Smoking status: Former Smoker    Types: Cigarettes    Quit date: 1990    Years since quitting: 30.8   Smokeless tobacco: Never Used  Substance Use Topics   Alcohol use: No   Drug use: No    Comment: stopped 3 weeks ago     Colonoscopy:  PAP:  Bone density:  Lipid panel:  No Known Allergies  Current  Outpatient Medications  Medication Sig Dispense Refill   amLODipine (NORVASC) 2.5 MG tablet Take 1 tablet (2.5 mg total) by mouth daily. (Patient taking differently: Take 2.5 mg by mouth every Rojas. ) 30 tablet 0   doxycycline (VIBRA-TABS) 100 MG tablet Take 100 mg by mouth 2 (two) times daily.     promethazine (PHENERGAN) 25 MG tablet TAKE 1 TABLET (25 MG TOTAL) BY MOUTH EVERY 6 (SIX) HOURS AS NEEDED FOR NAUSEA OR VOMITING.     sertraline (ZOLOFT) 50 MG tablet Take 50 mg by mouth daily.     No current facility-administered medications for this visit.     OBJECTIVE: Vitals:   09/23/19 1335  BP: 131/82  Pulse: 82  Resp: 18  Temp: 98.4 F (36.9 C)     Body mass index is 46.33 kg/m.    ECOG FS:0 - Asymptomatic  General: Well-developed, well-nourished, no acute distress. Eyes: Pink conjunctiva, anicteric sclera. HEENT: Normocephalic, moist mucous membranes. Lungs: Clear to auscultation bilaterally. Heart: Regular rate and rhythm. No rubs, murmurs, or gallops. Abdomen: Soft, nontender, nondistended. No organomegaly noted, normoactive bowel sounds. Musculoskeletal: No edema, cyanosis, or clubbing. Neuro: Alert, answering all questions appropriately. Cranial nerves grossly intact. Skin: No rashes or petechiae noted. Psych: Normal affect.  LAB RESULTS:  Lab Results  Component Value Date   NA 134 (L) 09/08/2019   K 4.2 09/08/2019   CL 108 09/08/2019   CO2 23 09/08/2019   GLUCOSE 92 09/08/2019   BUN 21 (H) 09/08/2019   CREATININE 1.44 (H) 09/08/2019   CALCIUM 8.6 (L) 09/08/2019   PROT 8.1 08/23/2018   ALBUMIN 4.3 08/23/2018   AST 24 08/23/2018   ALT 16 08/23/2018   ALKPHOS 100 08/23/2018   BILITOT 0.8 08/23/2018   GFRNONAA 41 (L) 09/08/2019   GFRAA 48 (L) 09/08/2019    Lab Results  Component Value Date   WBC 5.7 09/08/2019   NEUTROABS 3.2 09/08/2019   HGB 12.9 09/08/2019   HCT 41.1 09/08/2019   MCV 83.4 09/08/2019   PLT 251 09/08/2019     STUDIES: Ct  Abdomen Pelvis W Contrast  Result Date: 09/08/2019 CLINICAL DATA:  Restaging cervical cancer treated with chemotherapy and radiation therapy. EXAM: CT ABDOMEN AND PELVIS WITH CONTRAST TECHNIQUE: Multidetector CT imaging of the abdomen and pelvis was performed using the standard protocol following bolus administration of intravenous contrast. CONTRAST:  72m OMNIPAQUE IOHEXOL 300 MG/ML  SOLN COMPARISON:  02/06/2019. FINDINGS: Lower chest: Faint 2 mm left lower lobe nodule (4/5), unchanged. Heart size normal. No pericardial or pleural effusion. Distal periesophageal lymph nodes are not enlarged by CT size criteria. Hepatobiliary: Liver is unremarkable. Cholecystectomy. No biliary ductal dilatation. Pancreas: Negative. Spleen: Negative. Adrenals/Urinary Tract: Right adrenal gland is unremarkable. Slight thickening of medial limb left adrenal gland, unchanged. Renal cortical thinning on the right. A percutaneous nephrostomy tube terminates in the right renal pelvis. No hydronephrosis. Phleboliths are seen adjacent to the distal right ureter. Left kidney is unremarkable. Left ureter is decompressed. Bladder is low in volume Stomach/Bowel: Stomach is  unremarkable. Previously seen small bowel intussusception in the left abdomen appears to have resolved in the interval. However, there may be residual segmental wall thickening (2/25-39). Small bowel, appendix and colon are otherwise unremarkable. Vascular/Lymphatic: Atherosclerotic calcification of the aorta without aneurysm. No pathologically enlarged lymph nodes. Reproductive: Uterus is visualized.  No adnexal mass. Other: No free fluid. Umbilical and bilateral inguinal hernias contain fat. Supraumbilical ventral hernia repair. Mesenteries and peritoneum are unremarkable. Musculoskeletal: No worrisome lytic or sclerotic lesions. IMPRESSION: 1. No evidence of metastatic disease. 2. Right-sided percutaneous nephrostomy tube without hydronephrosis. 3. Previously seen  left-sided small-bowel intussusception appears to have resolved in the interval but there may be residual segmental wall thickening. 4.  Aortic atherosclerosis (ICD10-170.0). Electronically Signed   By: Lorin Picket M.D.   On: 09/08/2019 11:12    ASSESSMENT: Squamous cell carcinoma of the cervix, PDL-1 13%.  PLAN:    1. Squamous cell carcinoma of the cervix: Patient completed concurrent chemotherapy and XRT in April 2018. Brachytherapy at [facility] was completed shortly thereafter.  Patient's most recent imaging was CT scanning on September 08, 2019 was reviewed independently and reported as above with no obvious evidence of recurrent or metastatic disease.  No intervention is needed at this time.  Return to clinic in 6 months with repeat imaging and further evaluation.  At this point, patient will be greater than 3 years removed from completing her treatment and likely can be switched to a yearly evaluation and imaging.   2.  Ureteral stricture: Patient continues to have nephrostomy tube.  Continue evaluation and treatment per urology.  Patient reports she has a follow-up appointment in the next several weeks. 3.  Pain: Chronic and unchanged.  Patient is no longer taking narcotics. 4.  Renal insufficiency: Chronic and unchanged.  Patient's most recent creatinine is 1.44. Nephrostomy tube in place.   Patient expressed understanding and was in agreement with this plan. She also understands that She can call clinic at any time with any questions, concerns, or complaints.   Cancer Staging Malignant neoplasm of cervix [facility]) Staging form: Corpus Uteri - Carcinoma and Carcinosarcoma, AJCC 8th Edition - Clinical stage from 02/18/2017: FIGO Stage IIIC1 (cT3b, cN1, cM0) - Signed by Lloyd Huger, MD on 02/18/2017   Lloyd Huger, MD   09/25/2019 6:43 AM

## 2019-09-22 NOTE — Progress Notes (Signed)
Called patient to prechart for office visit.  Patient denies any new concerns

## 2019-09-23 ENCOUNTER — Inpatient Hospital Stay: Payer: Medicare Other | Attending: Oncology | Admitting: Oncology

## 2019-09-23 ENCOUNTER — Other Ambulatory Visit: Payer: Self-pay

## 2019-09-23 VITALS — BP 131/82 | HR 82 | Temp 98.4°F | Resp 18 | Wt 278.4 lb

## 2019-09-23 DIAGNOSIS — Z87891 Personal history of nicotine dependence: Secondary | ICD-10-CM | POA: Diagnosis not present

## 2019-09-23 DIAGNOSIS — N189 Chronic kidney disease, unspecified: Secondary | ICD-10-CM | POA: Insufficient documentation

## 2019-09-23 DIAGNOSIS — C538 Malignant neoplasm of overlapping sites of cervix uteri: Secondary | ICD-10-CM | POA: Diagnosis not present

## 2019-09-23 DIAGNOSIS — N3592 Unspecified urethral stricture, female: Secondary | ICD-10-CM | POA: Insufficient documentation

## 2019-09-23 DIAGNOSIS — C539 Malignant neoplasm of cervix uteri, unspecified: Secondary | ICD-10-CM | POA: Insufficient documentation

## 2019-09-23 DIAGNOSIS — Z936 Other artificial openings of urinary tract status: Secondary | ICD-10-CM | POA: Diagnosis not present

## 2019-09-23 DIAGNOSIS — G8929 Other chronic pain: Secondary | ICD-10-CM | POA: Insufficient documentation

## 2020-02-16 ENCOUNTER — Telehealth: Payer: Self-pay

## 2020-02-16 ENCOUNTER — Encounter: Payer: Self-pay | Admitting: Family Medicine

## 2020-02-16 ENCOUNTER — Other Ambulatory Visit: Payer: Self-pay

## 2020-02-16 DIAGNOSIS — Z1211 Encounter for screening for malignant neoplasm of colon: Secondary | ICD-10-CM

## 2020-02-16 NOTE — Telephone Encounter (Signed)
Gastroenterology Pre-Procedure Review  Request Date: Tuesday 03/23/20 Requesting Physician: Dr. Vicente Males  PATIENT REVIEW QUESTIONS: The patient responded to the following health history questions as indicated:    1. Are you having any GI issues? no 2. Do you have a personal history of Polyps? no 3. Do you have a family history of Colon Cancer or Polyps? no 4. Diabetes Mellitus? no 5. Joint replacements in the past 12 months?no 6. Major health problems in the past 3 months?no 7. Any artificial heart valves, MVP, or defibrillator?no    MEDICATIONS & ALLERGIES:    Patient reports the following regarding taking any anticoagulation/antiplatelet therapy:   Plavix, Coumadin, Eliquis, Xarelto, Lovenox, Pradaxa, Brilinta, or Effient? no Aspirin? no  Patient confirms/reports the following medications:  Current Outpatient Medications  Medication Sig Dispense Refill  . amLODipine (NORVASC) 2.5 MG tablet Take 1 tablet (2.5 mg total) by mouth daily. (Patient taking differently: Take 2.5 mg by mouth every morning. ) 30 tablet 0  . doxycycline (VIBRA-TABS) 100 MG tablet Take 100 mg by mouth 2 (two) times daily.    . promethazine (PHENERGAN) 25 MG tablet TAKE 1 TABLET (25 MG TOTAL) BY MOUTH EVERY 6 (SIX) HOURS AS NEEDED FOR NAUSEA OR VOMITING.    . sertraline (ZOLOFT) 50 MG tablet Take 50 mg by mouth daily.     No current facility-administered medications for this visit.    Patient confirms/reports the following allergies:  No Known Allergies  No orders of the defined types were placed in this encounter.   AUTHORIZATION INFORMATION Primary Insurance: 1D#: Group #:  Secondary Insurance: 1D#: Group #:  SCHEDULE INFORMATION: Date: Tuesday 03/23/20 Time: Location:[facility]

## 2020-03-17 ENCOUNTER — Telehealth: Payer: Self-pay | Admitting: Gastroenterology

## 2020-03-17 NOTE — Telephone Encounter (Signed)
Patient called to cancel her colonoscopy for 03-30-20 & would like to r/s to the following week. She is having surgery & it's just a little  Much to under go in the same week.

## 2020-03-18 NOTE — Telephone Encounter (Signed)
Patients call has been returned.  She would like to reschedule after her colonoscopy until after her surgery.  Wannetta Sender has been notified of cancellation.  Thanks,  [place], [place]

## 2020-03-19 NOTE — Progress Notes (Signed)
[place]  [phone] Fax:(336) [phone]  ID: Patricia Rojas OB: 03/31/1966  MR#: [id number]  HTD#:[id number]  Patient Care Team: Ranae Plumber, PA as PCP - General (Family Medicine) Clent Jacks, RN as Registered Nurse  CHIEF COMPLAINT: Squamous cell carcinoma of the cervix.  INTERVAL HISTORY: Patient returns to clinic today for routine 23-monthevaluation and discussion of her imaging results.  She currently feels well and is asymptomatic.  She has no neurologic complaints. She denies any recent fevers or illnesses.  She denies any chest pain, shortness of breath, cough, or hemoptysis.  She denies any nausea, vomiting, constipation, or diarrhea.  She has no urinary complaints.  Patient offers no specific complaints today.  REVIEW OF SYSTEMS:   Review of Systems  Constitutional: Negative.  Negative for fever, malaise/fatigue and weight loss.  Respiratory: Negative.  Negative for cough and shortness of breath.   Cardiovascular: Negative.  Negative for chest pain and leg swelling.  Gastrointestinal: Negative.  Negative for abdominal pain, constipation, diarrhea and nausea.  Genitourinary: Negative.  Negative for flank pain.  Musculoskeletal: Negative.  Negative for back pain.  Skin: Negative.  Negative for rash.  Neurological: Negative.  Negative for sensory change, focal weakness, weakness and headaches.  Psychiatric/Behavioral: Negative.  Negative for depression. The patient is not nervous/anxious.     As per HPI. Otherwise, a complete review of systems is negative.  PAST MEDICAL HISTORY: Past Medical History:  Diagnosis Date  . Anxiety   . [place]ervical [place]   internal rad tx's and chemo tx's.   . Constipation   . Depression   . GERD (gastroesophageal reflux disease)   . Hemorrhoids   . Hypertension   . N&V (nausea and vomiting)   . PONV (postoperative nausea and vomiting)    PT RECEIVED SCOPLAMINE PATCH FOR 01-09-18 SURGERY AND  SHE HAD NO N/V    PAST SURGICAL HISTORY: Past Surgical History:  Procedure Laterality Date  . CESAREAN SECTION  1986  . CHOLECYSTECTOMY    . CYSTOSCOPY W/ RETROGRADES Right 03/25/2018   Procedure: CYSTOSCOPY WITH RETROGRADE PYELOGRAM;  Surgeon: BHollice Espy MD;  Location: [facility] ORS;  Service: Urology;  Laterality: Right;  . CYSTOSCOPY W/ URETERAL STENT PLACEMENT Right 03/25/2018   Procedure: CYSTOSCOPY WITH STENT REPLACEMENT;  Surgeon: BHollice Espy MD;  Location: [facility] OR[facility];  Service: Urology;  Laterality: Right;  . HERNIA REPAIR    . IR GENERIC HISTORICAL  02/23/2017   IR NEPHROSTOMY PLACEMENT RIGHT 02/23/2017 DArne Cleveland MD [facility]INTERV RAD  . IR NEPHROSTOGRAM RIGHT THRU EXISTING ACCESS  02/15/2018  . IR NEPHROSTOMY EXCHANGE RIGHT  04/18/2017  . IR NEPHROSTOMY EXCHANGE RIGHT  06/08/2017  . IR NEPHROSTOMY EXCHANGE RIGHT  08/17/2017  . IR NEPHROSTOMY EXCHANGE RIGHT  10/05/2017  . IR NEPHROSTOMY EXCHANGE RIGHT  12/28/2017  . IR NEPHROSTOMY PLACEMENT RIGHT  06/05/2018  . IR URETERAL STENT PERC REMOVAL MOD SED  06/05/2018  . IR URETERAL STENT PLACEMENT EXISTING ACCESS RIGHT  02/15/2018  . VULVA /PERINEUM BIOPSY N/A 01/09/2018   Procedure: VAGINAL  AND CERVIX BIOPSY;  Surgeon: BMellody Drown MD;  Location: [facility] ORS;  Service: Gynecology;  Laterality: N/A;    FAMILY HISTORY: Family History  Problem Relation Age of Onset  . Cancer Maternal Uncle   . Cancer Paternal Aunt   . Kidney cancer Neg Hx   . Bladder Cancer Neg Hx     ADVANCED DIRECTIVES (Y/N):  N  HEALTH MAINTENANCE: Social History   Tobacco Use  .  Smoking status: Former Smoker    Types: Cigarettes    Quit date: 1990    Years since quitting: 31.3  . Smokeless tobacco: Never Used  Substance Use Topics  . Alcohol use: No  . Drug use: No    Comment: stopped 3 weeks ago     Colonoscopy:  PAP:  Bone density:  Lipid panel:  No Known Allergies  Current Outpatient Medications  Medication Sig Dispense Refill  .  amLODipine (NORVASC) 2.5 MG tablet Take 1 tablet (2.5 mg total) by mouth daily. (Patient taking differently: Take 2.5 mg by mouth every Rojas. ) 30 tablet 0  . atorvastatin (LIPITOR) 10 MG tablet Take 10 mg by mouth daily.    . sertraline (ZOLOFT) 50 MG tablet Take 50 mg by mouth daily.    Marland Kitchen doxycycline (VIBRA-TABS) 100 MG tablet Take 100 mg by mouth 2 (two) times daily.    . promethazine (PHENERGAN) 25 MG tablet TAKE 1 TABLET (25 MG TOTAL) BY MOUTH EVERY 6 (SIX) HOURS AS NEEDED FOR NAUSEA OR VOMITING.     No current facility-administered medications for this visit.    OBJECTIVE: Vitals:   03/23/20 1615  BP: 121/82  Pulse: 77  Resp: 20  Temp: (!) 96 F (35.6 C)  SpO2: 100%     Body mass index is 46.16 kg/m.    ECOG FS:0 - Asymptomatic  General: Well-developed, well-nourished, no acute distress. Eyes: Pink conjunctiva, anicteric sclera. HEENT: Normocephalic, moist mucous membranes. Lungs: No audible wheezing or coughing. Heart: Regular rate and rhythm. Abdomen: Soft, nontender, no obvious distention. Musculoskeletal: No edema, cyanosis, or clubbing. Neuro: Alert, answering all questions appropriately. Cranial nerves grossly intact. Skin: No rashes or petechiae noted. Psych: Normal affect.  LAB RESULTS:  Lab Results  Component Value Date   NA 140 03/23/2020   K 4.2 03/23/2020   CL 105 03/23/2020   CO2 26 03/23/2020   GLUCOSE 102 (H) 03/23/2020   BUN 20 03/23/2020   CREATININE 1.37 (H) 03/23/2020   CALCIUM 8.9 03/23/2020   PROT 8.1 08/23/2018   ALBUMIN 4.3 08/23/2018   AST 24 08/23/2018   ALT 16 08/23/2018   ALKPHOS 100 08/23/2018   BILITOT 0.8 08/23/2018   GFRNONAA 44 (L) 03/23/2020   GFRAA 51 (L) 03/23/2020    Lab Results  Component Value Date   WBC 5.7 09/08/2019   NEUTROABS 3.2 09/08/2019   HGB 12.9 09/08/2019   HCT 41.1 09/08/2019   MCV 83.4 09/08/2019   PLT 251 09/08/2019     STUDIES: CT Abdomen Pelvis W Contrast  Result Date:  03/23/2020 CLINICAL DATA:  Restaging cervical cancer. Prior chemotherapy and radiation therapy. EXAM: CT ABDOMEN AND PELVIS WITH CONTRAST TECHNIQUE: Multidetector CT imaging of the abdomen and pelvis was performed using the standard protocol following bolus administration of intravenous contrast. CONTRAST:  82m OMNIPAQUE IOHEXOL 350 MG/ML SOLN COMPARISON:  09/08/2019 FINDINGS: Lower chest: Mild subsegmental atelectasis medially in the right lower lobe Hepatobiliary: Cholecystectomy. No other significant hepatic findings. Pancreas: Unremarkable Spleen: Unremarkable Adrenals/Urinary Tract: Both adrenal glands appear normal. Mild right renal atrophy. Right nephrostomy tube in place. No hydronephrosis. Calcifications along the distal right ureter, probably phleboliths as suggested previously. Stomach/Bowel: Sigmoid diverticulosis without active diverticulitis. Vascular/Lymphatic: Aortoiliac atherosclerotic vascular disease. No findings of pathologic adenopathy. Reproductive: Unremarkable appearance of the uterus and ovaries. Other: No supplemental non-categorized findings. Musculoskeletal: Umbilical hernia contains adipose tissue. IMPRESSION: 1. No findings of recurrent malignancy. 2. Other imaging findings of potential clinical significance: Sigmoid diverticulosis. Umbilical hernia contains  adipose tissue. Mild right renal atrophy. Right nephrostomy tube in place. 3. Aortic atherosclerosis. Aortic Atherosclerosis (ICD10-I70.0). Electronically Signed   By: Van Clines M.D.   On: 03/23/2020 12:55    ASSESSMENT: Squamous cell carcinoma of the cervix, PDL-1 13%.  PLAN:    1. Squamous cell carcinoma of the cervix: Patient completed concurrent chemotherapy and XRT in April 2018. Brachytherapy at [facility] was completed shortly thereafter.  Patient's most recent imaging on March 23, 2020 reviewed independently and reported as above with no obvious evidence of recurrent or metastatic disease.  No  intervention is needed at this time.  Patient is now 3 years removed from completing treatment and can be transitioned to yearly imaging and evaluation until she is 5 years removed from treatment.  Return to clinic in 1 year with repeat CT scan and further evaluation.   2.  Ureteral stricture: Nephrostomy tube remains in place.  Continue follow-up with urology as scheduled. 3.  Pain: Patient did not complain of this today. 4.  Renal insufficiency: Chronic and unchanged.  Patient's most recent creatinine is 1.37.  Nephrostomy tube in place.   Patient expressed understanding and was in agreement with this plan. She also understands that She can call clinic at any time with any questions, concerns, or complaints.   Cancer Staging Malignant neoplasm of cervix [facility]) Staging form: Corpus Uteri - Carcinoma and Carcinosarcoma, AJCC 8th Edition - Clinical stage from 02/18/2017: FIGO Stage IIIC1 (cT3b, cN1, cM0) - Signed by Lloyd Huger, MD on 02/18/2017   Lloyd Huger, MD   03/25/2020 7:03 AM

## 2020-03-22 ENCOUNTER — Other Ambulatory Visit: Payer: Self-pay | Admitting: Emergency Medicine

## 2020-03-22 DIAGNOSIS — C538 Malignant neoplasm of overlapping sites of cervix uteri: Secondary | ICD-10-CM

## 2020-03-23 ENCOUNTER — Other Ambulatory Visit: Payer: Self-pay

## 2020-03-23 ENCOUNTER — Ambulatory Visit
Admission: RE | Admit: 2020-03-23 | Discharge: 2020-03-23 | Disposition: A | Discharge status: Home / Self Care | Payer: Medicare Other | Source: Ambulatory Visit | Attending: Oncology | Admitting: Oncology

## 2020-03-23 ENCOUNTER — Encounter: Payer: Self-pay | Admitting: Oncology

## 2020-03-23 ENCOUNTER — Inpatient Hospital Stay: Payer: Medicare Other | Attending: Oncology

## 2020-03-23 DIAGNOSIS — C538 Malignant neoplasm of overlapping sites of cervix uteri: Secondary | ICD-10-CM | POA: Diagnosis present

## 2020-03-23 DIAGNOSIS — C539 Malignant neoplasm of cervix uteri, unspecified: Secondary | ICD-10-CM | POA: Insufficient documentation

## 2020-03-23 DIAGNOSIS — Z87891 Personal history of nicotine dependence: Secondary | ICD-10-CM | POA: Insufficient documentation

## 2020-03-23 DIAGNOSIS — Z936 Other artificial openings of urinary tract status: Secondary | ICD-10-CM | POA: Diagnosis not present

## 2020-03-23 DIAGNOSIS — N289 Disorder of kidney and ureter, unspecified: Secondary | ICD-10-CM | POA: Insufficient documentation

## 2020-03-23 LAB — BASIC METABOLIC PANEL
Anion gap: 9 (ref 5–15)
BUN: 20 mg/dL (ref 6–20)
CO2: 26 mmol/L (ref 22–32)
Calcium: 8.9 mg/dL (ref 8.9–10.3)
Chloride: 105 mmol/L (ref 98–111)
Creatinine, Ser: 1.37 mg/dL — ABNORMAL HIGH (ref 0.44–1.00)
GFR calc Af Amer: 51 mL/min — ABNORMAL LOW (ref >60)
GFR calc non Af Amer: 44 mL/min — ABNORMAL LOW (ref >60)
Glucose, Bld: 102 mg/dL — ABNORMAL HIGH (ref 70–99)
Potassium: 4.2 mmol/L (ref 3.5–5.1)
Sodium: 140 mmol/L (ref 135–145)

## 2020-03-23 MED ORDER — IOHEXOL 350 MG/ML SOLN
75.0000 mL | Freq: Once | INTRAVENOUS | Status: AC | PRN
  Administered 2020-03-23: 75 mL via INTRAVENOUS

## 2020-03-24 ENCOUNTER — Inpatient Hospital Stay (HOSPITAL_BASED_OUTPATIENT_CLINIC_OR_DEPARTMENT_OTHER): Payer: Medicare Other | Admitting: Oncology

## 2020-03-24 ENCOUNTER — Encounter: Payer: Self-pay | Admitting: Oncology

## 2020-03-24 VITALS — BP 121/82 | HR 77 | Temp 96.0°F | Resp 20 | Wt 277.4 lb

## 2020-03-24 DIAGNOSIS — C538 Malignant neoplasm of overlapping sites of cervix uteri: Secondary | ICD-10-CM | POA: Diagnosis not present

## 2020-03-24 DIAGNOSIS — C539 Malignant neoplasm of cervix uteri, unspecified: Secondary | ICD-10-CM | POA: Diagnosis not present

## 2020-03-30 ENCOUNTER — Ambulatory Visit: Admit: 2020-03-30 | Payer: Medicare Other | Admitting: Gastroenterology

## 2020-03-30 SURGERY — COLONOSCOPY WITH PROPOFOL
Anesthesia: General

## 2021-03-24 ENCOUNTER — Other Ambulatory Visit: Payer: Self-pay

## 2021-03-24 ENCOUNTER — Inpatient Hospital Stay: Payer: Medicare Other | Attending: Oncology

## 2021-03-24 ENCOUNTER — Ambulatory Visit
Admission: RE | Admit: 2021-03-24 | Discharge: 2021-03-24 | Disposition: A | Discharge status: Home / Self Care | Payer: Medicare Other | Source: Ambulatory Visit | Attending: Oncology | Admitting: Oncology

## 2021-03-24 DIAGNOSIS — M545 Low back pain, unspecified: Secondary | ICD-10-CM | POA: Insufficient documentation

## 2021-03-24 DIAGNOSIS — C538 Malignant neoplasm of overlapping sites of cervix uteri: Secondary | ICD-10-CM | POA: Insufficient documentation

## 2021-03-24 DIAGNOSIS — R102 Pelvic and perineal pain: Secondary | ICD-10-CM | POA: Insufficient documentation

## 2021-03-24 DIAGNOSIS — Z87891 Personal history of nicotine dependence: Secondary | ICD-10-CM | POA: Insufficient documentation

## 2021-03-24 DIAGNOSIS — N3592 Unspecified urethral stricture, female: Secondary | ICD-10-CM | POA: Insufficient documentation

## 2021-03-24 DIAGNOSIS — C539 Malignant neoplasm of cervix uteri, unspecified: Secondary | ICD-10-CM | POA: Insufficient documentation

## 2021-03-24 DIAGNOSIS — Z9221 Personal history of antineoplastic chemotherapy: Secondary | ICD-10-CM | POA: Insufficient documentation

## 2021-03-24 DIAGNOSIS — Z923 Personal history of irradiation: Secondary | ICD-10-CM | POA: Insufficient documentation

## 2021-03-24 LAB — POCT I-STAT CREATININE: Creatinine, Ser: 1.4 mg/dL — ABNORMAL HIGH (ref 0.44–1.00)

## 2021-03-24 MED ORDER — IOHEXOL 300 MG/ML  SOLN
100.0000 mL | Freq: Once | INTRAMUSCULAR | Status: AC | PRN
  Administered 2021-03-24: 100 mL via INTRAVENOUS

## 2021-03-28 ENCOUNTER — Inpatient Hospital Stay (HOSPITAL_BASED_OUTPATIENT_CLINIC_OR_DEPARTMENT_OTHER): Payer: Medicare Other | Admitting: Oncology

## 2021-03-28 ENCOUNTER — Encounter: Payer: Self-pay | Admitting: Oncology

## 2021-03-28 ENCOUNTER — Other Ambulatory Visit: Payer: Self-pay

## 2021-03-28 VITALS — BP 102/64 | HR 92 | Temp 99.0°F | Resp 17 | Ht 65.0 in | Wt 287.8 lb

## 2021-03-28 DIAGNOSIS — C538 Malignant neoplasm of overlapping sites of cervix uteri: Secondary | ICD-10-CM | POA: Diagnosis not present

## 2021-03-28 DIAGNOSIS — N3592 Unspecified urethral stricture, female: Secondary | ICD-10-CM | POA: Diagnosis not present

## 2021-03-28 DIAGNOSIS — C539 Malignant neoplasm of cervix uteri, unspecified: Secondary | ICD-10-CM | POA: Diagnosis not present

## 2021-03-28 DIAGNOSIS — R102 Pelvic and perineal pain: Secondary | ICD-10-CM | POA: Diagnosis not present

## 2021-03-28 DIAGNOSIS — Z9221 Personal history of antineoplastic chemotherapy: Secondary | ICD-10-CM | POA: Diagnosis not present

## 2021-03-28 DIAGNOSIS — Z923 Personal history of irradiation: Secondary | ICD-10-CM | POA: Diagnosis not present

## 2021-03-28 DIAGNOSIS — M545 Low back pain, unspecified: Secondary | ICD-10-CM | POA: Diagnosis not present

## 2021-03-28 DIAGNOSIS — Z87891 Personal history of nicotine dependence: Secondary | ICD-10-CM | POA: Diagnosis not present

## 2021-03-28 MED ORDER — PROMETHAZINE HCL 25 MG PO TABS: ORAL_TABLET | ORAL | 2 refills | Status: DC

## 2021-03-28 MED ORDER — SULFAMETHOXAZOLE-TRIMETHOPRIM 800-160 MG PO TABS: 1.0000 | ORAL_TABLET | Freq: Two times a day (BID) | ORAL | 0 refills | Status: DC

## 2021-03-28 NOTE — Progress Notes (Signed)
[place]  Telephone:(336) [phone] Fax:(336) [phone]  ID: Patricia Rojas OB: Apr 13, 1966  MR#: [id number]  AOZ#:[id number]  Patient Care Team: Ranae Plumber, [place] as PCP - General (Family Medicine) Clent Jacks, RN as Registered Nurse  CHIEF COMPLAINT: Squamous cell carcinoma of the cervix.  INTERVAL HISTORY: Patient returns to clinic today for follow-up.  She was last seen in clinic on 03/24/2020.    In the interim, she has done well.  She receives annual CT abdomen/pelvis for surveillance for her cervical cancer.  Most recent was from 03/23/2020 which was negative for recurrent malignancy.  Incidental findings of sigmoid diverticulosis and umbilical hernia.   She presents back today to review most recent CT scan.  She continues to feel well.  States she developed some left-sided flank and pelvic pain after drinking contrast for her CT scan last week.  She has had fluctuating constipation and diarrhea with occasional nausea.  States nausea is fairly common for her and she normally takes Phenergan with relief.  She is asking for a refill of her antiemetics.  She is scheduled to have her nephrostomy tube replaced tomorrow.  Feels some better today.  REVIEW OF SYSTEMS:   Review of Systems  Constitutional: Positive for malaise/fatigue. Negative for chills, fever and weight loss.  HENT: Negative for congestion, ear pain and tinnitus.   Eyes: Negative.  Negative for blurred vision and double vision.  Respiratory: Negative.  Negative for cough, sputum production and shortness of breath.   Cardiovascular: Negative.  Negative for chest pain, palpitations and leg swelling.  Gastrointestinal: Positive for abdominal pain (pelvic pain), constipation, diarrhea and nausea. Negative for vomiting.  Genitourinary: Positive for flank pain. Negative for dysuria, frequency and urgency.       Nephrostomy tube in place- Clean, dry   Musculoskeletal: Negative for back pain and falls.  Skin:  Negative.  Negative for rash.  Neurological: Positive for weakness. Negative for sensory change, focal weakness and headaches.  Endo/Heme/Allergies: Negative.  Does not bruise/bleed easily.  Psychiatric/Behavioral: Negative for depression. The patient is nervous/anxious. The patient does not have insomnia.     As per HPI. Otherwise, a complete review of systems is negative.  PAST MEDICAL HISTORY: Past Medical History:  Diagnosis Date  . Anxiety   . Cervical cancer ([place]) 2018   internal rad tx's and chemo tx's.   . Constipation   . Depression   . GERD (gastroesophageal reflux disease)   . Hemorrhoids   . Hypertension   . N&V (nausea and vomiting)   . PONV (postoperative nausea and vomiting)    PT RECEIVED SCOPLAMINE PATCH FOR 01-09-18 SURGERY AND SHE HAD NO N/V    PAST SURGICAL HISTORY: Past Surgical History:  Procedure Laterality Date  . CESAREAN SECTION  1986  . CHOLECYSTECTOMY    . CYSTOSCOPY W/ RETROGRADES Right 03/25/2018   Procedure: CYSTOSCOPY WITH RETROGRADE PYELOGRAM;  Surgeon: Hollice Espy, MD;  Location: [facility] OR[facility];  Service: Urology;  Laterality: Right;  . CYSTOSCOPY W/ URETERAL STENT PLACEMENT Right 03/25/2018   Procedure: CYSTOSCOPY WITH STENT REPLACEMENT;  Surgeon: Hollice Espy, MD;  Location: [facility];  Service: Urology;  Laterality: Right;  . HERNIA REPAIR    . IR GENERIC HISTORICAL  02/23/2017   IR NEPHROSTOMY PLACEMENT RIGHT 02/23/2017 [name], MD [facility]-INTER[facility] RAD  . IR NEPHROSTOGRAM RIGHT THRU EXISTING ACCESS  02/15/2018  . IR NEPHROSTOMY EXCHANGE RIGHT  04/18/2017  . IR NEPHROSTOMY EXCHANGE RIGHT  06/08/2017  . IR NEPHROSTOMY EXCHANGE RIGHT  08/17/2017  .  IR NEPHROSTOMY EXCHANGE RIGHT  10/05/2017  . IR NEPHROSTOMY EXCHANGE RIGHT  12/28/2017  . IR NEPHROSTOMY PLACEMENT RIGHT  06/05/2018  . IR URETERAL STENT PERC REMOVAL MOD SED  06/05/2018  . IR URETERAL STENT PLACEMENT EXISTING ACCESS RIGHT  02/15/2018  . VULVA /PERINEUM BIOPSY N/A 01/09/2018   Procedure:  VAGINAL  AND CERVIX BIOPSY;  Surgeon: [name], MD;  Location: [facility] ORS;  Service: Gynecology;  Laterality: N/A;    FAMILY HISTORY: Family History  Problem Relation Age of Onset  . Cancer Maternal Uncle   . Cancer Paternal Aunt   . Kidney cancer Neg Hx   . Bladder Cancer Neg Hx     ADVANCED DIRECTIVES (Y/N):  N  HEALTH MAINTENANCE: Social History   Tobacco Use  . Smoking status: Former Smoker    Types: Cigarettes    Quit date: 1990    Years since quitting: 32.3  . Smokeless tobacco: Never Used  Vaping Use  . Vaping Use: Never used  Substance Use Topics  . Alcohol use: No  . Drug use: No    Comment: stopped 3 weeks ago     Colonoscopy:  PAP:  Bone density:  Lipid panel:  No Known Allergies  Current Outpatient Medications  Medication Sig Dispense Refill  . amLODipine (NORVASC) 2.5 MG tablet Take 1 tablet (2.5 mg total) by mouth daily. (Patient taking differently: Take 2.5 mg by mouth every Rojas.) 30 tablet 0  . atorvastatin (LIPITOR) 10 MG tablet Take 10 mg by mouth daily.    . sertraline (ZOLOFT) 50 MG tablet Take 50 mg by mouth daily.    Marland Kitchen sulfamethoxazole-trimethoprim (BACTRIM DS) 800-160 MG tablet Take 1 tablet by mouth 2 (two) times daily. 10 tablet 0  . promethazine (PHENERGAN) 25 MG tablet TAKE 1 TABLET (25 MG TOTAL) BY MOUTH EVERY 6 (SIX) HOURS AS NEEDED FOR NAUSEA OR VOMITING. 30 tablet 2   No current facility-administered medications for this visit.    OBJECTIVE: Vitals:   03/28/21 1349  BP: 102/64  Pulse: 92  Resp: 17  Temp: 99 F (37.2 C)  SpO2: 100%     Body mass index is 47.89 kg/m.    ECOG FS:0 - Asymptomatic  Physical Exam Constitutional:      Appearance: Normal appearance. She is obese.  HENT:     Head: Normocephalic and atraumatic.  Eyes:     Pupils: Pupils are equal, round, and reactive to light.  Cardiovascular:     Rate and Rhythm: Normal rate and regular rhythm.     Heart sounds: Normal heart sounds. No murmur  heard.   Pulmonary:     Effort: Pulmonary effort is normal.     Breath sounds: Normal breath sounds. No wheezing.  Abdominal:     General: Bowel sounds are normal. There is no distension.     Palpations: Abdomen is soft.     Tenderness: There is abdominal tenderness in the left lower quadrant.  Musculoskeletal:        General: Normal range of motion.     Cervical back: Normal range of motion.     Comments: Right lower back-nephrostomy tube  In place.   Skin:    General: Skin is warm and dry.     Findings: No rash.  Neurological:     Mental Status: She is alert and oriented to person, place, and time.  Psychiatric:        Judgment: Judgment normal.     LAB RESULTS:  Lab Results  Component Value Date   NA 140 03/23/2020   K 4.2 03/23/2020   CL 105 03/23/2020   CO2 26 03/23/2020   GLUCOSE 102 (H) 03/23/2020   BUN 20 03/23/2020   CREATININE 1.40 (H) 03/24/2021   CALCIUM 8.9 03/23/2020   PROT 8.1 08/23/2018   ALBUMIN 4.3 08/23/2018   AST 24 08/23/2018   ALT 16 08/23/2018   ALKPHOS 100 08/23/2018   BILITOT 0.8 08/23/2018   GFRNONAA 44 (L) 03/23/2020   GFRAA 51 (L) 03/23/2020    Lab Results  Component Value Date   WBC 5.7 09/08/2019   NEUTROABS 3.2 09/08/2019   HGB 12.9 09/08/2019   HCT 41.1 09/08/2019   MCV 83.4 09/08/2019   PLT 251 09/08/2019     STUDIES: CT Abdomen Pelvis W Contrast  Result Date: 03/25/2021 CLINICAL DATA:  Cervical cancer restaging, status post chemotherapy and radiation, chronic right percutaneous nephrostomy tube EXAM: CT ABDOMEN AND PELVIS WITH CONTRAST TECHNIQUE: Multidetector CT imaging of the abdomen and pelvis was performed using the standard protocol following bolus administration of intravenous contrast. CONTRAST:  116m OMNIPAQUE IOHEXOL 300 MG/ML SOLN, additional oral enteric contrast COMPARISON:  03/23/2020 FINDINGS: Lower chest: No acute abnormality. Hepatobiliary: No focal liver abnormality is seen. Status post cholecystectomy. No  biliary dilatation. Pancreas: Unremarkable. No pancreatic ductal dilatation or surrounding inflammatory changes. Spleen: Normal in size without significant abnormality. Adrenals/Urinary Tract: Adrenal glands are unremarkable. Redemonstrated right percutaneous nephrostomy with formed pigtail in the renal calices. Kidneys are otherwise normal, without renal calculi, solid lesion, or hydronephrosis. Bladder is unremarkable. Stomach/Bowel: Stomach is within normal limits. Appendix appears normal. No evidence of bowel wall thickening, distention, or inflammatory changes. Pancolonic diverticulosis, severe in the sigmoid Vascular/Lymphatic: Aortic atherosclerosis. Redemonstrated saccular aneurysm arising from the posterior right renal artery measuring approximately 1.0 cm (series 2, image 26). No enlarged abdominal or pelvic lymph nodes. Reproductive: No mass or other significant abnormality. Other: Fat containing umbilical hernia. Evidence of prior epigastric ventral hernia mesh repair. No abdominopelvic ascites. Musculoskeletal: No acute or significant osseous findings. IMPRESSION: 1. No evidence of recurrent or metastatic disease within the abdomen or pelvis. 2. Redemonstrated right percutaneous nephrostomy with formed pigtail in the renal calices. No hydronephrosis. 3. Unchanged saccular aneurysm arising from the posterior right renal artery measuring approximately 1.0 cm. Continued attention on follow-up. 4. Pancolonic diverticulosis, severe in the sigmoid colon. Aortic Atherosclerosis (ICD10-I70.0). Electronically Signed   By: AEddie CandleM.D.   On: 03/25/2021 11:50    ASSESSMENT: Squamous cell carcinoma of the cervix, PDL-1 13%.  PLAN:    1. Squamous cell carcinoma of the cervix:  -Completed concurrent chemo and XRT in April 2018. -[facility] -CT abdomen pelvis from 03/23/2020 was negative for recurrent or metastatic disease. -She was switched to yearly follow-ups.  -Had  repeat CT abdomen pelvis on 03/25/2021 which did not reveal any evidence of recurrent or metastatic disease.  Redemonstrated right percutaneous nephrostomy. -Recommend repeat CT scan in 1 year.  2.  Ureteral stricture:  -[place]  -Surgery has been delayed secondary to risk of complications due to her BMI.  Goal weight is 230 pounds. -Continue to manage with PCN with exchanges every 3 months. -Last PCN exchange was on 01/04/2021. -She is scheduled for nephrostomy exchange tomorrow.  3.  Left lower back and pelvic pain: -Unclear etiology.  She has some chronic low back and pelvic pain but feels it is slightly worse. -Creatinine from 03/24/2021 was 1.40.  Stable. -  Low-grade temp of 99.9 today. -Could be secondary to IV/oral contrast she had with her CT scan last week. -Recommend she increase fluid intake. -If symptoms do not improve would recommend urinalysis to rule out UTI.  Unable to produce in clinic today.  Disposition: -Call clinic if her symptoms worsen. -Repeat CT abdomen/pelvis in 1 year.  Orders placed.  We will get this scheduled.  Greater than 50% was spent in counseling and coordination of care with this patient including but not limited to discussion of the relevant topics above (See A&P) including, but not limited to diagnosis and management of acute and chronic medical conditions.   Patient expressed understanding and was in agreement with this plan. She also understands that She can call clinic at any time with any questions, concerns, or complaints.   Cancer Staging Malignant neoplasm of cervix [facility]) Staging form: Corpus Uteri - Carcinoma and Carcinosarcoma, AJCC 8th Edition - Clinical stage from 02/18/2017: FIGO Stage IIIC1 (cT3b, cN1, cM0) - Signed by Lloyd Huger, MD on 02/18/2017 Histologic grade (G): GX Histologic grading system: 3 grade system   Jacquelin Hawking, NP   03/28/2021 2:55 PM

## 2021-10-08 ENCOUNTER — Emergency Department
Admission: EM | Admit: 2021-10-08 | Discharge: 2021-10-08 | Disposition: A | Discharge status: Home / Self Care | Payer: Medicare Other | Attending: Emergency Medicine | Admitting: Emergency Medicine

## 2021-10-08 ENCOUNTER — Other Ambulatory Visit: Payer: Self-pay

## 2021-10-08 ENCOUNTER — Encounter: Payer: Self-pay | Admitting: Intensive Care

## 2021-10-08 DIAGNOSIS — Z87891 Personal history of nicotine dependence: Secondary | ICD-10-CM | POA: Insufficient documentation

## 2021-10-08 DIAGNOSIS — Z79899 Other long term (current) drug therapy: Secondary | ICD-10-CM | POA: Diagnosis not present

## 2021-10-08 DIAGNOSIS — R109 Unspecified abdominal pain: Secondary | ICD-10-CM | POA: Diagnosis present

## 2021-10-08 DIAGNOSIS — Z85828 Personal history of other malignant neoplasm of skin: Secondary | ICD-10-CM | POA: Insufficient documentation

## 2021-10-08 DIAGNOSIS — T83022A Displacement of nephrostomy catheter, initial encounter: Secondary | ICD-10-CM | POA: Insufficient documentation

## 2021-10-08 DIAGNOSIS — Y732 Prosthetic and other implants, materials and accessory gastroenterology and urology devices associated with adverse incidents: Secondary | ICD-10-CM | POA: Insufficient documentation

## 2021-10-08 DIAGNOSIS — I10 Essential (primary) hypertension: Secondary | ICD-10-CM | POA: Diagnosis not present

## 2021-10-08 LAB — CBC WITH DIFFERENTIAL/PLATELET
Abs Immature Granulocytes: 0.02 10*3/uL (ref 0.00–0.07)
Basophils Absolute: 0.1 10*3/uL (ref 0.0–0.1)
Basophils Relative: 1 %
Eosinophils Absolute: 0.5 10*3/uL (ref 0.0–0.5)
Eosinophils Relative: 8 %
HCT: 45.5 % (ref 36.0–46.0)
Hemoglobin: 14.5 g/dL (ref 12.0–15.0)
Immature Granulocytes: 0 %
Lymphocytes Relative: 29 %
Lymphs Abs: 2 10*3/uL (ref 0.7–4.0)
MCH: 27.2 pg (ref 26.0–34.0)
MCHC: 31.9 g/dL (ref 30.0–36.0)
MCV: 85.4 fL (ref 80.0–100.0)
Monocytes Absolute: 0.6 10*3/uL (ref 0.1–1.0)
Monocytes Relative: 8 %
Neutro Abs: 3.9 10*3/uL (ref 1.7–7.7)
Neutrophils Relative %: 54 %
Platelets: 212 10*3/uL (ref 150–400)
RBC: 5.33 MIL/uL — ABNORMAL HIGH (ref 3.87–5.11)
RDW: 14.6 % (ref 11.5–15.5)
WBC: 7 10*3/uL (ref 4.0–10.5)
nRBC: 0 % (ref 0.0–0.2)

## 2021-10-08 LAB — BASIC METABOLIC PANEL
Anion gap: 6 (ref 5–15)
BUN: 20 mg/dL (ref 6–20)
CO2: 27 mmol/L (ref 22–32)
Calcium: 9.3 mg/dL (ref 8.9–10.3)
Chloride: 107 mmol/L (ref 98–111)
Creatinine, Ser: 1.34 mg/dL — ABNORMAL HIGH (ref 0.44–1.00)
GFR, Estimated: 47 mL/min — ABNORMAL LOW (ref >60)
Glucose, Bld: 100 mg/dL — ABNORMAL HIGH (ref 70–99)
Potassium: 4.5 mmol/L (ref 3.5–5.1)
Sodium: 140 mmol/L (ref 135–145)

## 2021-10-08 MED ORDER — ONDANSETRON 4 MG PO TBDP
ORAL_TABLET | ORAL | Status: AC
  Administered 2021-10-08: 4 mg
  Filled 2021-10-08: qty 1

## 2021-10-08 MED ORDER — ONDANSETRON 4 MG PO TBDP
4.0000 mg | ORAL_TABLET | Freq: Three times a day (TID) | ORAL | 0 refills | Status: DC | PRN
Start: 2021-10-08 — End: 2021-12-09

## 2021-10-08 MED ORDER — OXYCODONE HCL 5 MG PO TABS: 5.0000 mg | ORAL_TABLET | ORAL | 0 refills | Status: AC | PRN

## 2021-10-08 MED ORDER — OXYCODONE HCL 5 MG PO TABS
5.0000 mg | ORAL_TABLET | Freq: Once | ORAL | Status: AC
  Administered 2021-10-08: 5 mg via ORAL
  Filled 2021-10-08: qty 1

## 2021-10-08 MED ORDER — ONDANSETRON 4 MG PO TBDP
4.0000 mg | ORAL_TABLET | Freq: Once | ORAL | Status: AC
  Filled 2021-10-08: qty 1

## 2021-10-08 MED ORDER — OXYCODONE-ACETAMINOPHEN 5-325 MG PO TABS
1.0000 | ORAL_TABLET | Freq: Once | ORAL | Status: AC
  Administered 2021-10-08: 1 via ORAL
  Filled 2021-10-08: qty 1

## 2021-10-08 MED ORDER — ACETAMINOPHEN 500 MG PO TABS
1000.0000 mg | ORAL_TABLET | Freq: Once | ORAL | Status: AC
  Administered 2021-10-08: 1000 mg via ORAL
  Filled 2021-10-08: qty 2

## 2021-10-08 NOTE — Discharge Instructions (Signed)
[facility]Please [facility] to schedule a replacement of your PCN.  Importantly if your pain is not controlled at home on oxycodone if you are unable to eat or drink or you developing worsening pain or importantly if you have any fever, please return to the emergency department.  I recommend that you go to [place] can have this taken care of immediately.

## 2021-10-08 NOTE — ED Provider Notes (Signed)
[facility]  ____________________________________________   Event Date/Time   First MD Initiated Contact with Patient 10/08/21 2050     (approximate)  I have reviewed the triage vital signs and the nursing notes.   HISTORY  Chief Complaint nephrostomy tube pulled out    HPI RENEA SCHOONMAKER is a 55 y.o. female pmh cervical cancer, chronic ureteral obstruction post radiation s/p PCN placement who presents after the nephrostomy tube was pulled out.  Patient just had the PCN replaced yesterday with IR at [facility].  Tells me that she has had these for the past 4 years gets them replaced about 3 months.  She does have some right-sided flank pain which she says she usually gets when the tube is out.  Denies nausea vomiting fevers or chills.  No urinary symptoms.         Past Medical History:  Diagnosis Date   Anxiety    Cervical cancer ([place]) 2018   internal rad tx's and chemo tx's.    Constipation    Depression    GERD (gastroesophageal reflux disease)    Hemorrhoids    Hypertension    N&V (nausea and vomiting)    PONV (postoperative nausea and vomiting)    PT RECEIVED SCOPLAMINE PATCH FOR 01-09-18 SURGERY AND SHE HAD NO N/V    Patient Active Problem List   Diagnosis Date Noted   Hydronephrosis, right 05/16/2018   Ureteral stricture 05/15/2018   Acute kidney injury ([place]) 09/10/2017   Intractable pain    UTI (urinary tract infection) 08/12/2017   Pyelonephritis 06/27/2017   Sepsis ([place]) 06/10/2017   Intractable nausea and vomiting 04/01/2017   Morbid obesity with BMI of 40.0-44.9, adult ([place]) 03/07/2017   Malignant neoplasm of overlapping sites of cervix ([place]) 03/06/2017   Goals of care, counseling/discussion 02/18/2017   Malignant neoplasm of cervix ([place]) 02/07/2017    Past Surgical History:  Procedure Laterality Date   [place] W/ RETROGRADES Right 03/25/2018   Procedure: CYSTOSCOPY WITH RETROGRADE  PYELOGRAM;  Surgeon: Hollice Espy, MD;  Location: [facility] ORS;  Service: Urology;  Laterality: Right;   CYSTOSCOPY W/ URETERAL STENT PLACEMENT Right 03/25/2018   Procedure: CYSTOSCOPY WITH STENT REPLACEMENT;  Surgeon: Hollice Espy, MD;  Location: [facility] ORS;  Service: Urology;  Laterality: Right;   HERNIA REPAIR     IR GENERIC HISTORICAL  02/23/2017   IR NEPHROSTOMY PLACEMENT RIGHT 02/23/2017 [name], MD [facility]-INTERV RAD   IR NEPHROSTOGRAM RIGHT THRU EXISTING ACCESS  02/15/2018   IR NEPHROSTOMY EXCHANGE RIGHT  04/18/2017   IR NEPHROSTOMY EXCHANGE RIGHT  06/08/2017   IR NEPHROSTOMY EXCHANGE RIGHT  08/17/2017   IR NEPHROSTOMY EXCHANGE RIGHT  10/05/2017   IR NEPHROSTOMY EXCHANGE RIGHT  12/28/2017   IR NEPHROSTOMY PLACEMENT RIGHT  06/05/2018   IR URETERAL STENT PERC REMOVAL MOD SED  06/05/2018   IR URETERAL STENT PLACEMENT EXISTING ACCESS RIGHT  02/15/2018   VULVA Milagros Loll BIOPSY N/A 01/09/2018   Procedure: VAGINAL  AND CERVIX BIOPSY;  Surgeon: [name], MD;  Location: [facility] ORS;  Service: Gynecology;  Laterality: N/A;    Prior to Admission medications   Medication Sig Start Date End Date Taking? Authorizing Provider  ondansetron (ZOFRAN ODT) 4 MG disintegrating tablet Take 1 tablet (4 mg total) by mouth every 8 (eight) hours as needed for nausea or vomiting. 10/08/21  Yes Rada Hay, MD  oxyCODONE (ROXICODONE) 5 MG immediate release tablet Take 1 tablet (5  mg total) by mouth every 4 (four) hours as needed for up to 2 days for severe pain. 10/08/21 10/10/21 Yes Rada Hay, MD  amLODipine (NORVASC) 2.5 MG tablet Take 1 tablet (2.5 mg total) by mouth daily. Patient taking differently: Take 2.5 mg by mouth every morning. 10/01/17   Lloyd Huger, MD  atorvastatin (LIPITOR) 10 MG tablet Take 10 mg by mouth daily. 02/23/20   [provider]  promethazine (PHENERGAN) 25 MG tablet TAKE 1 TABLET (25 MG TOTAL) BY MOUTH EVERY 6 (SIX) HOURS AS NEEDED FOR NAUSEA OR VOMITING.  03/28/21   Jacquelin Hawking, NP  sertraline (ZOLOFT) 50 MG tablet Take 50 mg by mouth daily.    [provider]  sulfamethoxazole-trimethoprim (BACTRIM DS) 800-160 MG tablet Take 1 tablet by mouth 2 (two) times daily. 03/28/21   Jacquelin Hawking, NP    Allergies Patient has no known allergies.  Family History  Problem Relation Age of Onset   Cancer Maternal Uncle    Cancer Paternal Aunt    Kidney cancer Neg Hx    Bladder Cancer Neg Hx     Social History Social History   Tobacco Use   Smoking status: Former    Types: Cigarettes    Quit date: 1990    Years since quitting: 32.8   Smokeless tobacco: Never  Vaping Use   Vaping Use: Never used  Substance Use Topics   Alcohol use: No   Drug use: No    Comment: stopped 3 weeks ago    Review of Systems   Review of Systems  Constitutional:  Negative for chills and fever.  Gastrointestinal:  Negative for abdominal pain, nausea and vomiting.  Genitourinary:  Positive for flank pain. Negative for dysuria.  All other systems reviewed and are negative.  Physical Exam Updated Vital Signs BP 112/74 (BP Location: Right Arm)   Pulse 85   Temp 98.2 F (36.8 C) (Oral)   Resp 18   Ht 5\' 5"  (1.651 m)   Wt 127 kg   LMP 02/28/2017 (Approximate)   SpO2 98%   BMI 46.59 kg/m   Physical Exam Vitals and nursing note reviewed.  Constitutional:      General: She is not in acute distress.    Appearance: Normal appearance.  HENT:     Head: Normocephalic and atraumatic.  Eyes:     General: No scleral icterus.    Conjunctiva/sclera: Conjunctivae normal.  Pulmonary:     Effort: Pulmonary effort is normal. No respiratory distress.     Breath sounds: No stridor.  Musculoskeletal:        General: No deformity or signs of injury.     Cervical back: Normal range of motion.  Skin:    General: Skin is dry.     Coloration: Skin is not jaundiced or pale.  Neurological:     General: No focal deficit present.     Mental Status:  She is alert and oriented to person, place, and time. Mental status is at baseline.  Psychiatric:        Mood and Affect: Mood normal.        Behavior: Behavior normal.     LABS (all labs ordered are listed, but only abnormal results are displayed)  Labs Reviewed  CBC WITH DIFFERENTIAL/PLATELET - Abnormal; Notable for the following components:      Result Value   RBC 5.33 (*)    All other components within normal limits  BASIC METABOLIC PANEL - Abnormal;  Notable for the following components:   Glucose, Bld 100 (*)    Creatinine, Ser 1.34 (*)    GFR, Estimated 47 (*)    All other components within normal limits   ____________________________________________  EKG  N/a ____________________________________________  RADIOLOGY Almeta Monas, personally viewed and evaluated these images (plain radiographs) as part of my medical decision making, as well as reviewing the written report by the radiologist.  ED MD interpretation:  n/a    ____________________________________________   PROCEDURES  Procedure(s) performed (including Critical Care):  Procedures   ____________________________________________   INITIAL IMPRESSION / ASSESSMENT AND PLAN / ED COURSE     Patient is a 55 year old female who presents after her PCN was accidentally removed tonight.  She has chronic ureteral obstruction and is reliant on nephrostomy tubes at this point.  Gets them replaced every 3 months and has had them for about 4 years.  About 3 hours prior to arrival the tube came out.  She does have some discomfort in the right flank but has no other systemic symptoms including fevers chills nausea or vomiting.  Labs were obtained which showed a stable renal function no white count.  I discussed with the on-call urologist at [facility] about the timing of replacement and whether this needed to be done urgently or could wait until the next business day.  Dr. Rod Holler said that as long as there is no  symptoms of infection including fevers and if her pain is controlled then she can be discharged and have this done on Monday morning.  He did not feel that a urine sample would change management.  I discussed this with the patient and will prescribe a short course of oxycodone and Zofran.  We discussed strict return precautions for pain that is not controlled intractable nausea vomiting and any evidence of fever.  The patient understood.  I also advised that she just go to [facility] if these events arise so that she can be treated more expeditiously.  She also plans to call first thing Monday morning to get in for an appointment.      ____________________________________________   FINAL CLINICAL IMPRESSION(S) / ED DIAGNOSES  Final diagnoses:  Nephrostomy tube displaced [facility])     ED Discharge Orders          Ordered    oxyCODONE (ROXICODONE) 5 MG immediate release tablet  Every 4 hours PRN        10/08/21 2132    ondansetron (ZOFRAN ODT) 4 MG disintegrating tablet  Every 8 hours PRN        10/08/21 2132             Note:  This document was prepared using Dragon voice recognition software and may include unintentional dictation errors.    Rada Hay, MD 10/08/21 2239

## 2021-10-08 NOTE — ED Notes (Signed)
Pt reports having ride home.

## 2021-10-08 NOTE — ED Triage Notes (Signed)
Patients nephrostomy tube got pulled out around 4:20pm. PLaced 10/07/21

## 2021-10-08 NOTE — ED Notes (Signed)
Patient up to desk, complains of new onset 10/10 right side flank pain, states she was not having pain upon arrival.  EDP, Ahmadou Bolz notified.  See new orders.

## 2021-12-09 ENCOUNTER — Emergency Department
Admission: EM | Admit: 2021-12-09 | Discharge: 2021-12-09 | Disposition: A | Discharge status: Home / Self Care | Payer: 59 | Attending: Emergency Medicine | Admitting: Emergency Medicine

## 2021-12-09 ENCOUNTER — Emergency Department: Payer: 59

## 2021-12-09 ENCOUNTER — Other Ambulatory Visit: Payer: Self-pay

## 2021-12-09 ENCOUNTER — Telehealth: Payer: Self-pay | Admitting: Emergency Medicine

## 2021-12-09 ENCOUNTER — Encounter: Payer: Self-pay | Admitting: Intensive Care

## 2021-12-09 DIAGNOSIS — R0602 Shortness of breath: Secondary | ICD-10-CM | POA: Diagnosis present

## 2021-12-09 DIAGNOSIS — Z79899 Other long term (current) drug therapy: Secondary | ICD-10-CM | POA: Insufficient documentation

## 2021-12-09 DIAGNOSIS — Z87891 Personal history of nicotine dependence: Secondary | ICD-10-CM | POA: Diagnosis not present

## 2021-12-09 DIAGNOSIS — R062 Wheezing: Secondary | ICD-10-CM

## 2021-12-09 DIAGNOSIS — I10 Essential (primary) hypertension: Secondary | ICD-10-CM | POA: Insufficient documentation

## 2021-12-09 DIAGNOSIS — Z8541 Personal history of malignant neoplasm of cervix uteri: Secondary | ICD-10-CM | POA: Insufficient documentation

## 2021-12-09 DIAGNOSIS — Z20822 Contact with and (suspected) exposure to covid-19: Secondary | ICD-10-CM | POA: Diagnosis not present

## 2021-12-09 DIAGNOSIS — J40 Bronchitis, not specified as acute or chronic: Secondary | ICD-10-CM | POA: Diagnosis not present

## 2021-12-09 HISTORY — DX: Disorder of kidney and ureter, unspecified: N28.9

## 2021-12-09 LAB — CBC WITH DIFFERENTIAL/PLATELET
Abs Immature Granulocytes: 0.02 10*3/uL (ref 0.00–0.07)
Basophils Absolute: 0 10*3/uL (ref 0.0–0.1)
Basophils Relative: 0 %
Eosinophils Absolute: 0.1 10*3/uL (ref 0.0–0.5)
Eosinophils Relative: 1 %
HCT: 40.5 % (ref 36.0–46.0)
Hemoglobin: 13.1 g/dL (ref 12.0–15.0)
Immature Granulocytes: 0 %
Lymphocytes Relative: 9 %
Lymphs Abs: 0.9 10*3/uL (ref 0.7–4.0)
MCH: 26.6 pg (ref 26.0–34.0)
MCHC: 32.3 g/dL (ref 30.0–36.0)
MCV: 82.3 fL (ref 80.0–100.0)
Monocytes Absolute: 0.8 10*3/uL (ref 0.1–1.0)
Monocytes Relative: 8 %
Neutro Abs: 8.1 10*3/uL — ABNORMAL HIGH (ref 1.7–7.7)
Neutrophils Relative %: 82 %
Platelets: 225 10*3/uL (ref 150–400)
RBC: 4.92 MIL/uL (ref 3.87–5.11)
RDW: 14.4 % (ref 11.5–15.5)
WBC: 9.9 10*3/uL (ref 4.0–10.5)
nRBC: 0 % (ref 0.0–0.2)

## 2021-12-09 LAB — COMPREHENSIVE METABOLIC PANEL
ALT: 30 U/L (ref 0–44)
AST: 27 U/L (ref 15–41)
Albumin: 3.7 g/dL (ref 3.5–5.0)
Alkaline Phosphatase: 81 U/L (ref 38–126)
Anion gap: 9 (ref 5–15)
BUN: 16 mg/dL (ref 6–20)
CO2: 22 mmol/L (ref 22–32)
Calcium: 8.8 mg/dL — ABNORMAL LOW (ref 8.9–10.3)
Chloride: 103 mmol/L (ref 98–111)
Creatinine, Ser: 1.4 mg/dL — ABNORMAL HIGH (ref 0.44–1.00)
GFR, Estimated: 44 mL/min — ABNORMAL LOW (ref >60)
Glucose, Bld: 118 mg/dL — ABNORMAL HIGH (ref 70–99)
Potassium: 4.1 mmol/L (ref 3.5–5.1)
Sodium: 134 mmol/L — ABNORMAL LOW (ref 135–145)
Total Bilirubin: 1 mg/dL (ref 0.3–1.2)
Total Protein: 8.2 g/dL — ABNORMAL HIGH (ref 6.5–8.1)

## 2021-12-09 LAB — LACTIC ACID, PLASMA
Lactic Acid, Venous: 0.8 mmol/L (ref 0.5–1.9)
Lactic Acid, Venous: 1 mmol/L (ref 0.5–1.9)

## 2021-12-09 LAB — BRAIN NATRIURETIC PEPTIDE: B Natriuretic Peptide: 35.2 pg/mL (ref 0.0–100.0)

## 2021-12-09 LAB — PROTIME-INR
INR: 1.1 (ref 0.8–1.2)
Prothrombin Time: 14.1 seconds (ref 11.4–15.2)

## 2021-12-09 LAB — PROCALCITONIN: Procalcitonin: <0.1 ng/mL

## 2021-12-09 LAB — URINALYSIS, ROUTINE W REFLEX MICROSCOPIC
Bilirubin Urine: NEGATIVE
Glucose, UA: NEGATIVE mg/dL
Ketones, ur: NEGATIVE mg/dL
Nitrite: POSITIVE — AB
Protein, ur: 100 mg/dL — AB
Specific Gravity, Urine: 1.02 (ref 1.005–1.030)
WBC, UA: >50 WBC/hpf — ABNORMAL HIGH (ref 0–5)
pH: 5 (ref 5.0–8.0)

## 2021-12-09 LAB — RESP PANEL BY RT-PCR (FLU A&B, COVID) ARPGX2
Influenza A by PCR: NEGATIVE
Influenza B by PCR: NEGATIVE
SARS Coronavirus 2 by RT PCR: NEGATIVE

## 2021-12-09 LAB — D-DIMER, QUANTITATIVE: D-Dimer, Quant: 0.76 ug/mL-FEU — ABNORMAL HIGH (ref 0.00–0.50)

## 2021-12-09 LAB — APTT: aPTT: 29 seconds (ref 24–36)

## 2021-12-09 MED ORDER — IOHEXOL 350 MG/ML SOLN
75.0000 mL | Freq: Once | INTRAVENOUS | Status: AC | PRN
  Administered 2021-12-09: 12:00:00 75 mL via INTRAVENOUS

## 2021-12-09 MED ORDER — ONDANSETRON HCL 4 MG PO TABS: 4.0000 mg | ORAL_TABLET | Freq: Three times a day (TID) | ORAL | 0 refills | Status: DC | PRN

## 2021-12-09 MED ORDER — PREDNISONE 20 MG PO TABS
50.0000 mg | ORAL_TABLET | Freq: Once | ORAL | Status: AC
  Administered 2021-12-09: 15:00:00 50 mg via ORAL
  Filled 2021-12-09: qty 1

## 2021-12-09 MED ORDER — IPRATROPIUM-ALBUTEROL 0.5-2.5 (3) MG/3ML IN SOLN
9.0000 mL | Freq: Once | RESPIRATORY_TRACT | Status: AC
  Administered 2021-12-09: 15:00:00 9 mL via RESPIRATORY_TRACT
  Filled 2021-12-09: qty 3

## 2021-12-09 MED ORDER — ONDANSETRON 4 MG PO TBDP
ORAL_TABLET | ORAL | Status: AC
  Administered 2021-12-09: 16:00:00 4 mg via ORAL
  Filled 2021-12-09: qty 1

## 2021-12-09 MED ORDER — ONDANSETRON HCL 4 MG/2ML IJ SOLN
4.0000 mg | Freq: Once | INTRAMUSCULAR | Status: AC
  Administered 2021-12-09: 15:00:00 4 mg via INTRAVENOUS
  Filled 2021-12-09: qty 2

## 2021-12-09 MED ORDER — DOXYCYCLINE HYCLATE 100 MG PO CAPS: 100.0000 mg | ORAL_CAPSULE | Freq: Two times a day (BID) | ORAL | 0 refills | Status: AC

## 2021-12-09 MED ORDER — PREDNISONE 10 MG PO TABS: 40.0000 mg | ORAL_TABLET | Freq: Every day | ORAL | 0 refills | Status: AC

## 2021-12-09 MED ORDER — ONDANSETRON 4 MG PO TBDP: 4.0000 mg | ORAL_TABLET | Freq: Once | ORAL | Status: AC

## 2021-12-09 MED ORDER — SODIUM CHLORIDE 0.9 % IV SOLN
1.0000 g | Freq: Once | INTRAVENOUS | Status: AC
  Administered 2021-12-09: 15:00:00 1 g via INTRAVENOUS
  Filled 2021-12-09: qty 10

## 2021-12-09 MED ORDER — ALBUTEROL SULFATE HFA 108 (90 BASE) MCG/ACT IN AERS: 2.0000 | INHALATION_SPRAY | Freq: Four times a day (QID) | RESPIRATORY_TRACT | 2 refills | Status: DC | PRN

## 2021-12-09 MED ORDER — BENZONATATE 100 MG PO CAPS: 100.0000 mg | ORAL_CAPSULE | Freq: Three times a day (TID) | ORAL | 0 refills | Status: AC | PRN

## 2021-12-09 MED ORDER — LACTATED RINGERS IV BOLUS
1000.0000 mL | Freq: Once | INTRAVENOUS | Status: AC
  Administered 2021-12-09: 15:00:00 1000 mL via INTRAVENOUS

## 2021-12-09 MED ORDER — ACETAMINOPHEN 500 MG PO TABS
1000.0000 mg | ORAL_TABLET | Freq: Once | ORAL | Status: AC
  Administered 2021-12-09: 15:00:00 1000 mg via ORAL
  Filled 2021-12-09: qty 2

## 2021-12-09 NOTE — Telephone Encounter (Signed)
Contact pt shortly after discharge as immediately prior to discharge she was charted as significantly more tachycardic with HR of 135 than she had been over several hours of observation. Pt states she was able to pickup all her prescription medications and is feeling a little better. Advised that if she feels even a little worse or develops any new symptoms to call 911 or return to the ED immediately for re-evaluation. She will contact her PCP tomorrow to see if she can be seen for a f/u visit tomorrow.

## 2021-12-09 NOTE — ED Triage Notes (Addendum)
Patient c/o cold symptoms that started X2 weeks ago and then progressed to sob and emesis. A&O x4 in triage. Patient has right sided nephrostomy tube.

## 2021-12-09 NOTE — ED Provider Notes (Addendum)
[facility] Emergency Department Provider Note  ____________________________________________   Event Date/Time   First MD Initiated Contact with Patient 12/09/21 1103     (approximate)  I have reviewed the triage vital signs and the nursing notes.   HISTORY  Chief Complaint Shortness of Breath and Emesis   HPI Patricia Rojas is a 55 y.o. female pmh cervical cancer, chronic ureteral obstruction post radiation s/p PCN placement who presents for assessment of 2 to 3 weeks of cough congestion and shortness of breath that she feels is gotten much worse over the last 2 days.  He states that over the last day or so she started developing nonbloody nonbilious vomiting.  Denies any associated clear chest pain, abdominal pain, back pain, change urine urostomy tube output, rash or extremity pain.  No recent falls or injuries.  She does endorse overall malaise and decreased p.o. intake.  No other acute concerns at this time.         Past Medical History:  Diagnosis Date   Anxiety    Cervical cancer ([place]) 2018   internal rad tx's and chemo tx's.    Constipation    Depression    GERD (gastroesophageal reflux disease)    Hemorrhoids    Hypertension    N&V (nausea and vomiting)    PONV (postoperative nausea and vomiting)    PT RECEIVED SCOPLAMINE PATCH FOR 01-09-18 SURGERY AND SHE HAD NO N/V   Renal disorder     Patient Active Problem List   Diagnosis Date Noted   Hydronephrosis, right 05/16/2018   Ureteral stricture 05/15/2018   Acute kidney injury ([place]) 09/10/2017   Intractable pain    UTI (urinary tract infection) 08/12/2017   Pyelonephritis 06/27/2017   Sepsis ([place]) 06/10/2017   Intractable nausea and vomiting 04/01/2017   Morbid obesity with BMI of 40.0-44.9, adult ([place]) 03/07/2017   Malignant neoplasm of overlapping sites of cervix ([place]) 03/06/2017   Goals of care, counseling/discussion 02/18/2017   Malignant neoplasm of cervix ([place]) 02/07/2017     Past Surgical History:  Procedure Laterality Date   [place] W/ RETROGRADES Right 03/25/2018   Procedure: CYSTOSCOPY WITH RETROGRADE PYELOGRAM;  Surgeon: Hollice Espy, MD;  Location: [facility] ORS;  Service: Urology;  Laterality: Right;   CYSTOSCOPY W/ URETERAL STENT PLACEMENT Right 03/25/2018   Procedure: CYSTOSCOPY WITH STENT REPLACEMENT;  Surgeon: Hollice Espy, MD;  Location: [facility] ORS;  Service: Urology;  Laterality: Right;   HERNIA REPAIR     IR GENERIC HISTORICAL  02/23/2017   IR NEPHROSTOMY PLACEMENT RIGHT 02/23/2017 [name], MD [facility] RAD   IR NEPHROSTOGRAM RIGHT THRU EXISTING ACCESS  02/15/2018   IR NEPHROSTOMY EXCHANGE RIGHT  04/18/2017   IR NEPHROSTOMY EXCHANGE RIGHT  06/08/2017   IR NEPHROSTOMY EXCHANGE RIGHT  08/17/2017   IR NEPHROSTOMY EXCHANGE RIGHT  10/05/2017   IR NEPHROSTOMY EXCHANGE RIGHT  12/28/2017   IR NEPHROSTOMY PLACEMENT RIGHT  06/05/2018   IR URETERAL STENT PERC REMOVAL MOD SED  06/05/2018   IR URETERAL STENT PLACEMENT EXISTING ACCESS RIGHT  02/15/2018   VULVA Milagros Loll BIOPSY N/A 01/09/2018   Procedure: VAGINAL  AND CERVIX BIOPSY;  Surgeon: [name], MD;  Location: [facility] ORS;  Service: Gynecology;  Laterality: N/A;    Prior to Admission medications   Medication Sig Start Date End Date Taking? Authorizing Provider  albuterol (VENTOLIN HFA) 108 (90 Base) MCG/ACT inhaler Inhale 2 puffs into the lungs every 6 (six)  hours as needed for wheezing or shortness of breath. 12/09/21  Yes Lucrezia Starch, MD  benzonatate (TESSALON PERLES) 100 MG capsule Take 1 capsule (100 mg total) by mouth 3 (three) times daily as needed for up to 5 days for cough. 12/09/21 12/14/21 Yes Lucrezia Starch, MD  doxycycline (VIBRAMYCIN) 100 MG capsule Take 1 capsule (100 mg total) by mouth 2 (two) times daily for 10 days. 12/09/21 12/19/21 Yes Lucrezia Starch, MD  ondansetron (ZOFRAN) 4 MG tablet Take 1 tablet (4 mg total) by mouth every 8  (eight) hours as needed for up to 10 doses for nausea or vomiting. 12/09/21  Yes Lucrezia Starch, MD  predniSONE (DELTASONE) 10 MG tablet Take 4 tablets (40 mg total) by mouth daily for 4 days. 12/09/21 12/13/21 Yes Lucrezia Starch, MD  amLODipine (NORVASC) 2.5 MG tablet Take 1 tablet (2.5 mg total) by mouth daily. Patient taking differently: Take 2.5 mg by mouth every morning. 10/01/17   Lloyd Huger, MD  atorvastatin (LIPITOR) 10 MG tablet Take 10 mg by mouth daily. 02/23/20   [provider]  promethazine (PHENERGAN) 25 MG tablet TAKE 1 TABLET (25 MG TOTAL) BY MOUTH EVERY 6 (SIX) HOURS AS NEEDED FOR NAUSEA OR VOMITING. 03/28/21   Jacquelin Hawking, NP  sertraline (ZOLOFT) 50 MG tablet Take 50 mg by mouth daily.    [provider]  sulfamethoxazole-trimethoprim (BACTRIM DS) 800-160 MG tablet Take 1 tablet by mouth 2 (two) times daily. 03/28/21   Jacquelin Hawking, NP    Allergies Patient has no known allergies.  Family History  Problem Relation Age of Onset   Cancer Maternal Uncle    Cancer Paternal Aunt    Kidney cancer Neg Hx    Bladder Cancer Neg Hx     Social History Social History   Tobacco Use   Smoking status: Former    Types: Cigarettes    Quit date: 1990    Years since quitting: 33.0   Smokeless tobacco: Never  Vaping Use   Vaping Use: Never used  Substance Use Topics   Alcohol use: No   Drug use: No    Comment: stopped 3 weeks ago    Review of Systems  Review of Systems  Constitutional:  Negative for chills and fever.  HENT:  Negative for sore throat.   Eyes:  Negative for pain.  Respiratory:  Positive for cough and shortness of breath. Negative for stridor.   Cardiovascular:  Negative for chest pain.  Gastrointestinal:  Positive for nausea and vomiting.  Skin:  Negative for rash.  Neurological:  Negative for seizures, loss of consciousness and headaches.  Psychiatric/Behavioral:  Negative for suicidal ideas.   All other systems  reviewed and are negative.    ____________________________________________   PHYSICAL EXAM:  VITAL SIGNS: ED Triage Vitals  Enc Vitals Group     BP 12/09/21 0826 (!) 170/105     Pulse Rate 12/09/21 0826 (!) 117     Resp 12/09/21 0826 (!) 26     Temp 12/09/21 0826 99 F (37.2 C)     Temp Source 12/09/21 0826 Oral     SpO2 12/09/21 0826 (!) 86 %     Weight 12/09/21 0827 277 lb (125.6 kg)     Height 12/09/21 0827 5\' 5"  (1.651 m)     Head Circumference --      Peak Flow --      Pain Score 12/09/21 0827 8     Pain  Loc --      Pain Edu? --      Excl. in [place]? --    Vitals:   12/09/21 1500 12/09/21 1545  BP:  109/73  Pulse: (!) 118 (!) 135  Resp: (!) 22 (!) 23  Temp:  (!) 102.8 F (39.3 C)  SpO2: 99% 97%   Physical Exam Vitals and nursing note reviewed.  Constitutional:      General: She is not in acute distress.    Appearance: She is well-developed. She is obese. She is ill-appearing.  HENT:     Head: Normocephalic and atraumatic.     Right Ear: External ear normal.     Left Ear: External ear normal.     Nose: Nose normal.     Mouth/Throat:     Mouth: Mucous membranes are dry.  Eyes:     Conjunctiva/sclera: Conjunctivae normal.  Cardiovascular:     Rate and Rhythm: Regular rhythm. Tachycardia present.     Heart sounds: No murmur heard. Pulmonary:     Effort: Pulmonary effort is normal. Tachypnea present. No respiratory distress.     Breath sounds: Decreased breath sounds and wheezing present.  Abdominal:     Palpations: Abdomen is soft.     Tenderness: There is no abdominal tenderness.  Musculoskeletal:        General: No swelling.     Cervical back: Neck supple.  Skin:    General: Skin is warm and dry.     Capillary Refill: Capillary refill takes less than 2 seconds.  Neurological:     Mental Status: She is alert and oriented to person, place, and time.  Psychiatric:        Mood and Affect: Mood normal.    Nephrostomy tube insertion site in the right  flank is unremarkable skin around it.  There is yellow urine in the tubing. ____________________________________________   LABS (all labs ordered are listed, but only abnormal results are displayed)  Labs Reviewed  COMPREHENSIVE METABOLIC PANEL - Abnormal; Notable for the following components:      Result Value   Sodium 134 (*)    Glucose, Bld 118 (*)    Creatinine, Ser 1.40 (*)    Calcium 8.8 (*)    Total Protein 8.2 (*)    GFR, Estimated 44 (*)    All other components within normal limits  CBC WITH DIFFERENTIAL/PLATELET - Abnormal; Notable for the following components:   Neutro Abs 8.1 (*)    All other components within normal limits  URINALYSIS, ROUTINE W REFLEX MICROSCOPIC - Abnormal; Notable for the following components:   Color, Urine YELLOW (*)    APPearance HAZY (*)    Hgb urine dipstick SMALL (*)    Protein, ur 100 (*)    Nitrite POSITIVE (*)    Leukocytes,Ua MODERATE (*)    WBC, UA >50 (*)    Bacteria, UA MANY (*)    All other components within normal limits  D-DIMER, QUANTITATIVE (NOT AT [facility]) - Abnormal; Notable for the following components:   D-Dimer, Quant 0.76 (*)    All other components within normal limits  RESP PANEL BY RT-PCR (FLU A&B, COVID) ARPGX2  CULTURE, BLOOD (ROUTINE X 2)  CULTURE, BLOOD (ROUTINE X 2)  URINE CULTURE  LACTIC ACID, PLASMA  LACTIC ACID, PLASMA  PROTIME-INR  APTT  PROCALCITONIN  BRAIN NATRIURETIC PEPTIDE   ____________________________________________  EKG  ECG remarkable sinus tachycardia with a ventricular rate of 115, unremarkable intervals, normal axis, without clear evidence of  acute ischemia or significant arrhythmia. ____________________________________________  RADIOLOGY  ED MD interpretation: Chest x-ray shows scarring at the bases without clear focal consolidation, effusion, edema, pneumothorax or other clear acute thoracic process.  There is an age-indeterminate compression deformity of the upper thoracic vertebral  body.  CTA chest shows no evidence of a PE, pneumonia, effusion, edema, pneumothorax or other clear acute thoracic process.  Official radiology report(s): DG Chest 2 View  Result Date: 12/09/2021 CLINICAL DATA:  Productive cough, chest congestion, wheezing, shortness of breath EXAM: CHEST - 2 VIEW COMPARISON:  Chest radiograph 06/10/2017 FINDINGS: The cardiomediastinal silhouette is normal. Linear opacities in the left lung base are unchanged since 2018, likely reflecting scar. There is no focal consolidation or pulmonary edema. There is no pleural effusion or pneumothorax. There is mild compression deformity of an upper thoracic vertebral body, age indeterminate but new since 2018. IMPRESSION: 1. Unchanged scarring in the left base. No radiographic evidence of acute cardiopulmonary process. 2. Age-indeterminate mild compression deformity of an upper thoracic vertebral body, new since 2018. Electronically Signed   By: Valetta Mole M.D.   On: 12/09/2021 09:15   CT Angio Chest PE W and/or Wo Contrast  Result Date: 12/09/2021 CLINICAL DATA:  55 year old female with possible pulmonary embolism EXAM: CT ANGIOGRAPHY CHEST WITH CONTRAST TECHNIQUE: Multidetector CT imaging of the chest was performed using the standard protocol during bolus administration of intravenous contrast. Multiplanar CT image reconstructions and MIPs were obtained to evaluate the vascular anatomy. CONTRAST:  40mL OMNIPAQUE IOHEXOL 350 MG/ML SOLN COMPARISON:  No prior chest CT available for comparison FINDINGS: Cardiovascular: Heart: No cardiomegaly. No pericardial fluid/thickening. No significant coronary calcifications. Aorta: Unremarkable course, caliber, contour of the thoracic aorta. No aneurysm or dissection flap. No periaortic fluid. Mild atherosclerotic changes. Pulmonary arteries: No central, lobar, segmental, or proximal subsegmental filling defects. Mediastinum/Nodes: No mediastinal adenopathy. Unremarkable appearance of the  thoracic esophagus. Unremarkable appearance of the thoracic inlet and thyroid. Lungs/Pleura: Respiratory motion somewhat limits evaluation. Linear scarring/atelectasis within the lingula. No pleural effusion. No confluent airspace disease. No pneumothorax. Upper Abdomen: No acute. Musculoskeletal: No acute displaced fracture. Degenerative changes of the spine. Review of the MIP images confirms the above findings. IMPRESSION: CT angiogram is negative for pulmonary emboli. No acute finding of the chest CT. Electronically Signed   By: Corrie Mckusick D.O.   On: 12/09/2021 12:43    ____________________________________________   PROCEDURES  Procedure(s) performed (including Critical Care):  .1-3 Lead EKG Interpretation Performed by: Lucrezia Starch, MD Authorized by: Lucrezia Starch, MD     Interpretation: non-specific     ECG rate assessment: tachycardic     Rhythm: sinus rhythm     Ectopy: none     Conduction: normal     ____________________________________________   INITIAL IMPRESSION / ASSESSMENT AND PLAN / ED COURSE        Patient presents with above-stated history exam for evaluation of worsening cough congestion shortness of breath associated with vomiting.  On arrival patient is tachycardic and tachypneic and initially noted to be hypoxic in triage on room air at 86% which improved to the mid 90s initially on 6 L but rapidly weaned to 2 L.  Differential includes bronchitis, bacterial pneumonia, obstructive airway disease, PE, ACS, metabolic derangements, anemia and CHF.   ECG remarkable sinus tachycardia with a ventricular rate of 115, unremarkable intervals, normal axis, without clear evidence of acute ischemia or significant arrhythmia.  Given absence of any reported chest pain Evalose suspicion for ACS or  myocarditis.  Chest x-ray shows scarring at the bases without clear focal consolidation, effusion, edema, pneumothorax or other clear acute thoracic process.  There is an  age-indeterminate compression deformity of the upper thoracic vertebral body.    CTA obtained due to elevated D-dimer chest shows no evidence of a PE, pneumonia, effusion, edema, pneumothorax or other clear acute thoracic process.  BNP is double digits and overall patient does not appear grossly volume overloaded on imaging or exam.  Procalcitonin is undetectable.  COVID influenza PCR is negative.  Lactic acid nonelevated at 1.  CMP without significant electrolyte or metabolic derangements.  CBC without leukocytosis or acute anemia.  UA has some nitrites although this was obtained from patient and a prostate tube which suspect is chronically colonized patient is denying any symptoms from this area.  In addition she has no fever, leukocytosis, undetectable procalcitonin and normal lactic.  I do not believe she is septic I think her tachycardia tachypnea are from an acute bronchitis with some obstructive disease exacerbation.   Given wheezing concern for possible obstructive airway disease in the setting of bronchitis.  Patient dates she has not smoked in decades and denies any history of COPD or asthma.  Patient given a dose of Rocephin prednisone and DuoNeb breathing treatment.  On reassessment her wheezing is much improved.  She states her breathing is much better.  He did have some vomiting emergency room so we will give a course of Zofran as well as prednisone albuterol inhaler at home.  Will also cover with a course of rocephin for possible atypical PNA.  Instructed follow-up with PCP.      ____________________________________________   FINAL CLINICAL IMPRESSION(S) / ED DIAGNOSES  Final diagnoses:  Bronchitis  Wheezing    Medications  iohexol (OMNIPAQUE) 350 MG/ML injection 75 mL (75 mLs Intravenous Contrast Given 12/09/21 1225)  cefTRIAXone (ROCEPHIN) 1 g in sodium chloride 0.9 % 100 mL IVPB (1 g Intravenous New Bag/Given 12/09/21 1446)  lactated ringers bolus 1,000 mL (1,000 mLs  Intravenous New Bag/Given 12/09/21 1508)  ipratropium-albuterol (DUONEB) 0.5-2.5 (3) MG/3ML nebulizer solution 9 mL (9 mLs Nebulization Given 12/09/21 1507)  acetaminophen (TYLENOL) tablet 1,000 mg (1,000 mg Oral Given 12/09/21 1502)  predniSONE (DELTASONE) tablet 50 mg (50 mg Oral Given 12/09/21 1504)  ondansetron (ZOFRAN) injection 4 mg (4 mg Intravenous Given 12/09/21 1452)     ED Discharge Orders          Ordered    albuterol (VENTOLIN HFA) 108 (90 Base) MCG/ACT inhaler  Every 6 hours PRN        12/09/21 1426    predniSONE (DELTASONE) 10 MG tablet  Daily        12/09/21 1426    benzonatate (TESSALON PERLES) 100 MG capsule  3 times daily PRN        12/09/21 1426    ondansetron (ZOFRAN) 4 MG tablet  Every 8 hours PRN        12/09/21 1530    doxycycline (VIBRAMYCIN) 100 MG capsule  2 times daily        12/09/21 1551             Note:  This document was prepared using Dragon voice recognition software and may include unintentional dictation errors.    Lucrezia Starch, MD 12/09/21 1532    Lucrezia Starch, MD 12/09/21 1552    Lucrezia Starch, MD 12/09/21 [phone]

## 2021-12-09 NOTE — ED Notes (Signed)
Pt actively vomiting. Notified MD.

## 2021-12-11 LAB — URINE CULTURE: Culture: >=100000 — AB

## 2021-12-12 NOTE — Progress Notes (Signed)
Brief Pharmacy Note  Reviewed ED Culture Report. Patient presented with cough, congestion. She was afebrile, PCT negative, WBC normal and lactic 1.0. She denied any urinary symptoms. Urine culture drawn from chronic nephrostomy tube, suspect colonization as noted by EDP Smith at time patient seen. Also discussed with EDP Siadecki 12/12/21 who is in agreement that culture likely represents colonization and no need to treat given absence of symptoms and presented with respiratory concerns. She was sent home with doxycycline at time of initial evaluation.  Tawnya Crook, PharmD, BCPS Clinical Pharmacist 12/12/2021 4:52 PM

## 2021-12-14 LAB — CULTURE, BLOOD (ROUTINE X 2)
Culture: NO GROWTH
Culture: NO GROWTH
Special Requests: ADEQUATE

## 2022-03-22 ENCOUNTER — Telehealth: Payer: Self-pay | Admitting: *Deleted

## 2022-03-22 NOTE — Telephone Encounter (Signed)
Patient called asking when she is to see doctor again, When checking her chart, I see that she does not have doctor follow up scheduled and her last disposition did not include doctor follow up, oly lab and CT appointment. Please advise when she is to return to see doctor and enter a schedule order for this to be completed.  ? ?She was last seen 03/28/21 ?

## 2022-03-23 ENCOUNTER — Inpatient Hospital Stay: Payer: 59 | Attending: Oncology

## 2022-03-23 DIAGNOSIS — N3592 Unspecified urethral stricture, female: Secondary | ICD-10-CM | POA: Diagnosis not present

## 2022-03-23 DIAGNOSIS — Z8541 Personal history of malignant neoplasm of cervix uteri: Secondary | ICD-10-CM | POA: Insufficient documentation

## 2022-03-23 DIAGNOSIS — Z87891 Personal history of nicotine dependence: Secondary | ICD-10-CM | POA: Diagnosis not present

## 2022-03-23 DIAGNOSIS — Z79899 Other long term (current) drug therapy: Secondary | ICD-10-CM | POA: Insufficient documentation

## 2022-03-23 DIAGNOSIS — Z936 Other artificial openings of urinary tract status: Secondary | ICD-10-CM | POA: Diagnosis not present

## 2022-03-23 DIAGNOSIS — C538 Malignant neoplasm of overlapping sites of cervix uteri: Secondary | ICD-10-CM

## 2022-03-23 LAB — CBC WITH DIFFERENTIAL/PLATELET
Abs Immature Granulocytes: 0.02 10*3/uL (ref 0.00–0.07)
Basophils Absolute: 0.1 10*3/uL (ref 0.0–0.1)
Basophils Relative: 1 %
Eosinophils Absolute: 0.5 10*3/uL (ref 0.0–0.5)
Eosinophils Relative: 5 %
HCT: 42.9 % (ref 36.0–46.0)
Hemoglobin: 13.8 g/dL (ref 12.0–15.0)
Immature Granulocytes: 0 %
Lymphocytes Relative: 23 %
Lymphs Abs: 2.1 10*3/uL (ref 0.7–4.0)
MCH: 27 pg (ref 26.0–34.0)
MCHC: 32.2 g/dL (ref 30.0–36.0)
MCV: 83.8 fL (ref 80.0–100.0)
Monocytes Absolute: 0.9 10*3/uL (ref 0.1–1.0)
Monocytes Relative: 10 %
Neutro Abs: 5.7 10*3/uL (ref 1.7–7.7)
Neutrophils Relative %: 61 %
Platelets: 257 10*3/uL (ref 150–400)
RBC: 5.12 MIL/uL — ABNORMAL HIGH (ref 3.87–5.11)
RDW: 14.4 % (ref 11.5–15.5)
WBC: 9.2 10*3/uL (ref 4.0–10.5)
nRBC: 0 % (ref 0.0–0.2)

## 2022-03-23 LAB — COMPREHENSIVE METABOLIC PANEL
ALT: 16 U/L (ref 0–44)
AST: 23 U/L (ref 15–41)
Albumin: 3.9 g/dL (ref 3.5–5.0)
Alkaline Phosphatase: 99 U/L (ref 38–126)
Anion gap: 5 (ref 5–15)
BUN: 21 mg/dL — ABNORMAL HIGH (ref 6–20)
CO2: 23 mmol/L (ref 22–32)
Calcium: 8.8 mg/dL — ABNORMAL LOW (ref 8.9–10.3)
Chloride: 109 mmol/L (ref 98–111)
Creatinine, Ser: 1.18 mg/dL — ABNORMAL HIGH (ref 0.44–1.00)
GFR, Estimated: 55 mL/min — ABNORMAL LOW (ref >60)
Glucose, Bld: 94 mg/dL (ref 70–99)
Potassium: 4.3 mmol/L (ref 3.5–5.1)
Sodium: 137 mmol/L (ref 135–145)
Total Bilirubin: 0.5 mg/dL (ref 0.3–1.2)
Total Protein: 7.8 g/dL (ref 6.5–8.1)

## 2022-03-24 ENCOUNTER — Ambulatory Visit
Admission: RE | Admit: 2022-03-24 | Discharge: 2022-03-24 | Disposition: A | Discharge status: Home / Self Care | Payer: 59 | Source: Ambulatory Visit | Attending: Oncology | Admitting: Oncology

## 2022-03-24 ENCOUNTER — Other Ambulatory Visit: Payer: Self-pay

## 2022-03-24 DIAGNOSIS — C538 Malignant neoplasm of overlapping sites of cervix uteri: Secondary | ICD-10-CM | POA: Diagnosis not present

## 2022-03-24 MED ORDER — IOHEXOL 350 MG/ML SOLN
100.0000 mL | Freq: Once | INTRAVENOUS | Status: AC | PRN
  Administered 2022-03-24: 100 mL via INTRAVENOUS

## 2022-03-27 NOTE — Progress Notes (Signed)
?[place]  ?[phone] [phone] ? ?ID: [name] OB: 12/17/1965  MR#: [id number]  KMQ#:[id number] ? ?Patient Care Team: ?Ranae Plumber, [place] as PCP - General (Family Medicine) ?Clent Jacks, RN as Registered Nurse ? ?CHIEF COMPLAINT: Squamous cell carcinoma of the cervix. ? ?INTERVAL HISTORY: Patient returns to clinic today for routine yearly evaluation and discussion of her imaging results.  She currently feels well and is asymptomatic.  She continues to have a nephrostomy tube which is being exchanged in May. She has no neurologic complaints. She denies any recent fevers or illnesses.  She denies any chest pain, shortness of breath, cough, or hemoptysis.  She denies any nausea, vomiting, constipation, or diarrhea.  She has no urinary complaints.  Patient offers no further specific complaints today. ? ?REVIEW OF SYSTEMS:   ?Review of Systems  ?Constitutional: Negative.  Negative for fever, malaise/fatigue and weight loss.  ?Respiratory: Negative.  Negative for cough and shortness of breath.   ?Cardiovascular: Negative.  Negative for chest pain and leg swelling.  ?Gastrointestinal: Negative.  Negative for abdominal pain, constipation, diarrhea and nausea.  ?Genitourinary: Negative.  Negative for flank pain.  ?Musculoskeletal: Negative.  Negative for back pain.  ?Skin: Negative.  Negative for rash.  ?Neurological: Negative.  Negative for sensory change, focal weakness, weakness and headaches.  ?Psychiatric/Behavioral: Negative.  Negative for depression. The patient is not nervous/anxious.   ? ?As per HPI. Otherwise, a complete review of systems is negative. ? ?PAST MEDICAL HISTORY: ?Past Medical History:  ?Diagnosis Date  ? Anxiety   ? Cervical cancer ([place]) 2018  ? internal rad tx's and chemo tx's.   ? Constipation   ? Depression   ? GERD (gastroesophageal reflux disease)   ? Hemorrhoids   ? Hypertension   ? N&V (nausea and vomiting)   ? PONV (postoperative nausea  and vomiting)   ? PT RECEIVED SCOPLAMINE PATCH FOR 01-09-18 SURGERY AND SHE HAD NO N/V  ? Renal disorder   ? ? ?PAST SURGICAL HISTORY: ?Past Surgical History:  ?Procedure Laterality Date  ? [place]  ? CHOLECYSTECTOMY    ? CYSTOSCOPY W/ RETROGRADES Right 03/25/2018  ? Procedure: CYSTOSCOPY WITH RETROGRADE PYELOGRAM;  Surgeon: Hollice Espy, MD;  Location: [facility] OR[facility];  Service: Urology;  Laterality: Right;  ? CYSTOSCOPY W/ URETERAL STENT PLACEMENT Right 03/25/2018  ? Procedure: CYSTOSCOPY WITH STENT REPLACEMENT;  Surgeon: Hollice Espy, MD;  Location: [facility];  Service: Urology;  Laterality: Right;  ? HERNIA REPAIR    ? IR GENERIC HISTORICAL  02/23/2017  ? IR NEPHROSTOMY PLACEMENT RIGHT 02/23/2017 [name], MD [facility]INTER[facility] RAD  ? IR NEPHROSTOGRAM RIGHT THRU EXISTING ACCESS  02/15/2018  ? IR NEPHROSTOMY EXCHANGE RIGHT  04/18/2017  ? IR NEPHROSTOMY EXCHANGE RIGHT  06/08/2017  ? IR NEPHROSTOMY EXCHANGE RIGHT  08/17/2017  ? IR NEPHROSTOMY EXCHANGE RIGHT  10/05/2017  ? IR NEPHROSTOMY EXCHANGE RIGHT  12/28/2017  ? IR NEPHROSTOMY PLACEMENT RIGHT  06/05/2018  ? IR URETERAL STENT PERC REMOVAL MOD SED  06/05/2018  ? IR URETERAL STENT PLACEMENT EXISTING ACCESS RIGHT  02/15/2018  ? VULVA /PERINEUM BIOPSY N/A 01/09/2018  ? Procedure: VAGINAL  AND CERVIX BIOPSY;  Surgeon: [name], MD;  Location: [facility] OR[facility];  Service: Gynecology;  Laterality: N/A;  ? ? ?FAMILY HISTORY: ?Family History  ?Problem Relation Age of Onset  ? Cancer Maternal Uncle   ? Cancer Paternal Aunt   ? Kidney cancer Neg Hx   ? Bladder Cancer Neg Hx   ? ? ?  ADVANCED DIRECTIVES (Y/N):  N ? ?HEALTH MAINTENANCE: ?Social History  ? ?Tobacco Use  ? Smoking status: Former  ?  Types: Cigarettes  ?  Quit date: 31  ?  Years since quitting: 33.3  ? Smokeless tobacco: Never  ?Vaping Use  ? Vaping Use: Never used  ?Substance Use Topics  ? Alcohol use: No  ? Drug use: No  ?  Comment: stopped 3 weeks ago  ? ? ? Colonoscopy: ? PAP: ? Bone density: ? Lipid panel: ? ?No  Known Allergies ? ?Current Outpatient Medications  ?Medication Sig Dispense Refill  ? albuterol (VENTOLIN HFA) 108 (90 Base) MCG/ACT inhaler Inhale 2 puffs into the lungs every 6 (six) hours as needed for wheezing or shortness of breath. 8 g 2  ? amLODipine (NORVASC) 2.5 MG tablet Take 1 tablet (2.5 mg total) by mouth daily. (Patient taking differently: Take 2.5 mg by mouth every morning.) 30 tablet 0  ? atorvastatin (LIPITOR) 10 MG tablet Take 10 mg by mouth daily.    ? ondansetron (ZOFRAN) 4 MG tablet Take 1 tablet (4 mg total) by mouth every 8 (eight) hours as needed for up to 10 doses for nausea or vomiting. 10 tablet 0  ? promethazine (PHENERGAN) 25 MG tablet TAKE 1 TABLET (25 MG TOTAL) BY MOUTH EVERY 6 (SIX) HOURS AS NEEDED FOR NAUSEA OR VOMITING. 30 tablet 2  ? sertraline (ZOLOFT) 50 MG tablet Take 50 mg by mouth daily.    ? sulfamethoxazole-trimethoprim (BACTRIM DS) 800-160 MG tablet Take 1 tablet by mouth 2 (two) times daily. 10 tablet 0  ? ?No current facility-administered medications for this visit.  ? ? ?OBJECTIVE: ?There were no vitals filed for this visit. ?   There is no height or weight on file to calculate BMI.    ECOG FS:0 - Asymptomatic ? ?General: Well-developed, well-nourished, no acute distress. ?Eyes: Pink conjunctiva, anicteric sclera. ?HEENT: Normocephalic, moist mucous membranes. ?Lungs: No audible wheezing or coughing. ?Heart: Regular rate and rhythm. ?Abdomen: Soft, nontender, no obvious distention. ?Musculoskeletal: No edema, cyanosis, or clubbing. ?Neuro: Alert, answering all questions appropriately. Cranial nerves grossly intact. ?Skin: No rashes or petechiae noted. ?Psych: Normal affect. ? ?LAB RESULTS: ? ?Lab Results  ?Component Value Date  ? NA 137 03/23/2022  ? K 4.3 03/23/2022  ? CL 109 03/23/2022  ? CO2 23 03/23/2022  ? GLUCOSE 94 03/23/2022  ? BUN 21 (H) 03/23/2022  ? CREATININE 1.18 (H) 03/23/2022  ? CALCIUM 8.8 (L) 03/23/2022  ? PROT 7.8 03/23/2022  ? ALBUMIN 3.9 03/23/2022  ?  AST 23 03/23/2022  ? ALT 16 03/23/2022  ? ALKPHOS 99 03/23/2022  ? BILITOT 0.5 03/23/2022  ? GFRNONAA 55 (L) 03/23/2022  ? GFRAA 51 (L) 03/23/2020  ? ? ?Lab Results  ?Component Value Date  ? WBC 9.2 03/23/2022  ? NEUTROABS 5.7 03/23/2022  ? HGB 13.8 03/23/2022  ? HCT 42.9 03/23/2022  ? MCV 83.8 03/23/2022  ? PLT 257 03/23/2022  ? ? ? ?STUDIES: ?CT Abdomen Pelvis W Contrast ? ?Result Date: 03/25/2022 ?CLINICAL DATA:  Cervical cancer restaging, history of chemo and radiation; * Tracking Code: BO * EXAM: CT ABDOMEN AND PELVIS WITH CONTRAST TECHNIQUE: Multidetector CT imaging of the abdomen and pelvis was performed using the standard protocol following bolus administration of intravenous contrast. RADIATION DOSE REDUCTION: This exam was performed according to the departmental dose-optimization program which includes automated exposure control, adjustment of the mA and/or kV according to patient size and/or use of iterative reconstruction technique. CONTRAST:  141m OMNIPAQUE IOHEXOL 350 MG/ML SOLN COMPARISON:  CT abdomen and pelvis dated March 24, 2021 FINDINGS: Lower chest: No acute abnormality. Hepatobiliary: Focal fatty infiltration of the gallbladder process. No suspicious liver lesions. Gallbladder is surgically absent. Pancreas: Unremarkable. No pancreatic ductal dilatation or surrounding inflammatory changes. Spleen: Normal in size without focal abnormality. Adrenals/Urinary Tract: Bilateral adrenal glands are unremarkable. Right sided percutaneous nephrostomy tube. No evidence of hydronephrosis or nephrolithiasis. Bladder is unremarkable. Stomach/Bowel: Small hiatal hernia. Stomach is otherwise unremarkable. Normal appendix. Sigmoid colon diverticulosis. No wall thickening, inflammatory change or evidence of obstruction. Vascular/Lymphatic: Aortic atherosclerosis. Small right renal artery aneurysm measuring 9 mm on series 2, image 27, unchanged in size when compared to prior exam. See no enlarged abdominal or  pelvic lymph nodes. Reproductive: Uterus and bilateral adnexa are unremarkable. Other: Small fat containing umbilical hernia. No abdominopelvic ascites. Musculoskeletal: No acute or significant osseous fi

## 2022-03-28 ENCOUNTER — Inpatient Hospital Stay (HOSPITAL_BASED_OUTPATIENT_CLINIC_OR_DEPARTMENT_OTHER): Payer: 59 | Admitting: Oncology

## 2022-03-28 VITALS — BP 133/81 | HR 97 | Temp 97.5°F | Resp 18 | Ht 65.0 in | Wt 288.2 lb

## 2022-03-28 DIAGNOSIS — C538 Malignant neoplasm of overlapping sites of cervix uteri: Secondary | ICD-10-CM | POA: Diagnosis not present

## 2022-03-28 DIAGNOSIS — Z8541 Personal history of malignant neoplasm of cervix uteri: Secondary | ICD-10-CM | POA: Diagnosis not present

## 2022-06-09 ENCOUNTER — Ambulatory Visit: Payer: 59 | Admitting: Nurse Practitioner

## 2022-07-07 ENCOUNTER — Ambulatory Visit: Payer: 59 | Admitting: Nurse Practitioner

## 2022-07-14 ENCOUNTER — Ambulatory Visit: Payer: 59 | Admitting: Nurse Practitioner

## 2022-08-17 ENCOUNTER — Ambulatory Visit: Payer: 59 | Admitting: Nurse Practitioner

## 2022-09-08 ENCOUNTER — Ambulatory Visit: Payer: 59 | Admitting: Nurse Practitioner

## 2022-10-18 NOTE — Progress Notes (Signed)
New Patient Office Visit  Subjective    Patient ID: Patricia Rojas, female    DOB: 09/20/1966  Age: 56 y.o. MRN: [id number]  CC:  Chief Complaint  Patient presents with   Establish Care    Needs to start on all her meds. Needs tdap and flu shot     HPI IRAN KIEVIT presents to establish care.  Her previous PCP was at [facility].  She has history of cervical cancer,artificial opening of urinary tract, hypertension and morbid obesity.Patient has right nephrostomy tube placed about 5 years ago due to right ureteral stricture. She is followed for her nephrostomy tube exchange at [facility].   She is trying to lose weight and have lost 8 pounds in 4 months.  She was taking amlodipine, atorvastatin and albuterol.  She needs refill on medication.  No other concerns at present.   Outpatient Encounter Medications as of 10/19/2022  Medication Sig   albuterol (VENTOLIN HFA) 108 (90 Base) MCG/ACT inhaler Inhale 2 puffs into the lungs every 6 (six) hours as needed for wheezing or shortness of breath.   promethazine (PHENERGAN) 25 MG tablet TAKE 1 TABLET (25 MG TOTAL) BY MOUTH EVERY 6 (SIX) HOURS AS NEEDED FOR NAUSEA OR VOMITING.   [DISCONTINUED] amLODipine (NORVASC) 2.5 MG tablet Take 1 tablet (2.5 mg total) by mouth daily. (Patient taking differently: Take 2.5 mg by mouth every morning.)   amLODipine (NORVASC) 2.5 MG tablet Take 1 tablet (2.5 mg total) by mouth daily.   atorvastatin (LIPITOR) 10 MG tablet Take 10 mg by mouth daily. (Patient not taking: Reported on 10/19/2022)   [DISCONTINUED] ondansetron (ZOFRAN) 4 MG tablet Take 1 tablet (4 mg total) by mouth every 8 (eight) hours as needed for up to 10 doses for nausea or vomiting. (Patient not taking: Reported on 03/28/2022)   [DISCONTINUED] sertraline (ZOLOFT) 50 MG tablet Take 50 mg by mouth daily.   No facility-administered encounter medications on file as of 10/19/2022.    Past Medical History:  Diagnosis Date   Anxiety     Cervical cancer ([place]) 2018   internal rad tx's and chemo tx's.    Constipation    Depression    GERD (gastroesophageal reflux disease)    Hemorrhoids    Hypertension    N&V (nausea and vomiting)    PONV (postoperative nausea and vomiting)    PT RECEIVED SCOPLAMINE PATCH FOR 01-09-18 SURGERY AND SHE HAD NO N/V   Renal disorder     Past Surgical History:  Procedure Laterality Date   CESAREAN SECTION  1986   CHOLECYSTECTOMY     CYSTOSCOPY W/ RETROGRADES Right 03/25/2018   Procedure: CYSTOSCOPY WITH RETROGRADE PYELOGRAM;  Surgeon: Hollice Espy, MD;  Location: [facility];  Service: Urology;  Laterality: Right;   CYSTOSCOPY W/ URETERAL STENT PLACEMENT Right 03/25/2018   Procedure: CYSTOSCOPY WITH STENT REPLACEMENT;  Surgeon: Hollice Espy, MD;  Location: [facility];  Service: Urology;  Laterality: Right;   HERNIA REPAIR     IR GENERIC HISTORICAL  02/23/2017   IR NEPHROSTOMY PLACEMENT RIGHT 02/23/2017 [name], MD [facility]   IR NEPHROSTOGRAM RIGHT THRU EXISTING ACCESS  02/15/2018   IR NEPHROSTOMY EXCHANGE RIGHT  04/18/2017   IR NEPHROSTOMY EXCHANGE RIGHT  06/08/2017   IR NEPHROSTOMY EXCHANGE RIGHT  08/17/2017   IR NEPHROSTOMY EXCHANGE RIGHT  10/05/2017   IR NEPHROSTOMY EXCHANGE RIGHT  12/28/2017   IR NEPHROSTOMY PLACEMENT RIGHT  06/05/2018   IR URETERAL STENT PERC REMOVAL MOD SED  06/05/2018   IR URETERAL STENT PLACEMENT EXISTING ACCESS RIGHT  02/15/2018   VULVA Milagros Loll BIOPSY N/A 01/09/2018   Procedure: VAGINAL  AND CERVIX BIOPSY;  Surgeon: [name], MD;  Location: [facility];  Service: Gynecology;  Laterality: N/A;    Family History  Problem Relation Age of Onset   Cancer Maternal Uncle    Cancer Paternal Aunt    Kidney cancer Neg Hx    Bladder Cancer Neg Hx     Social History   Socioeconomic History   Marital status: Single    Spouse name: Not on file   Number of children: Not on file   Years of education: Not on file   Highest education level: Not on file   Occupational History   Not on file  Tobacco Use   Smoking status: Former    Types: Cigarettes    Quit date: 46    Years since quitting: 33.8   Smokeless tobacco: Never  Vaping Use   Vaping Use: Never used  Substance and Sexual Activity   Alcohol use: No   Drug use: Yes    Types: Marijuana    Comment: stopped 3 weeks ago   Sexual activity: Not Currently  Other Topics Concern   Not on file  Social History Narrative   Not on file   Social Determinants of Health   Financial Resource Strain: Not on file  Food Insecurity: Not on file  Transportation Needs: Not on file  Physical Activity: Not on file  Stress: Not on file  Social Connections: Not on file  Intimate Partner Violence: Not on file    Review of Systems  Constitutional: Negative.   HENT: Negative.    Eyes: Negative.   Respiratory:  Negative for cough and shortness of breath.   Cardiovascular:  Negative for chest pain and leg swelling.  Gastrointestinal: Negative.   Genitourinary:  Negative for hematuria and urgency.  Musculoskeletal:  Positive for back pain (from the tube placement).  Skin:  Positive for rash.  Neurological: Negative.   Psychiatric/Behavioral:  Negative for depression, substance abuse and suicidal ideas.         Objective    BP 128/86   Pulse 80   Temp 97.8 F (36.6 C) (Temporal)   Resp 16   Ht '5\' 4"'$  (1.626 m)   Wt 280 lb 8 oz (127.2 kg)   LMP 12/18/2016   SpO2 97%   BMI 48.15 kg/m   Physical Exam Constitutional:      Appearance: Normal appearance. She is obese.  HENT:     Head: Normocephalic and atraumatic.     Right Ear: Tympanic membrane normal.     Left Ear: Tympanic membrane normal.     Mouth/Throat:     Mouth: Mucous membranes are moist.     Dentition: Abnormal dentition.  Eyes:     Pupils: Pupils are equal, round, and reactive to light.  Cardiovascular:     Rate and Rhythm: Normal rate and regular rhythm.     Pulses: Normal pulses.     Heart sounds:     No  gallop.  Pulmonary:     Effort: Pulmonary effort is normal.     Breath sounds: No wheezing.  Abdominal:     General: There is distension.     Palpations: Abdomen is soft.     Hernia: No hernia is present.  Genitourinary:    Comments: Right nephrostomy tube placement Musculoskeletal:        General: Normal range  of motion.     Right lower leg: No edema.     Left lower leg: No edema.  Skin:    General: Skin is warm.     Findings: Rash present.  Neurological:     General: No focal deficit present.     Mental Status: She is alert and oriented to person, place, and time. Mental status is at baseline.  Psychiatric:        Mood and Affect: Mood normal.        Behavior: Behavior normal.        Thought Content: Thought content normal.        Judgment: Judgment normal.         Assessment & Plan:   Problem List Items Addressed This Visit       Cardiovascular and Mediastinum   Primary hypertension - Primary    Patient BP 128/86 in the office today. Advised her to follow a low sodium and heart healthy diet. Continue amlodipine 2.5 mg daily.       Relevant Medications   amLODipine (NORVASC) 2.5 MG tablet   Other Relevant Orders   CBC with Differential/Platelet (Completed)   COMPLETE METABOLIC PANEL WITH GFR (Completed)   TSH (Completed)   Lipid panel (Completed)     Other   Morbid obesity ([place])    Body mass index is 48.15 kg/m. Advised her to lose weight. Advised patient to avoid trans fat, fatty and fried food. Follow a regular physical activity schedule. Referral sent for nutrition counseling.        Relevant Orders   CBC with Differential/Platelet (Completed)   COMPLETE METABOLIC PANEL WITH GFR (Completed)   TSH (Completed)   Lipid panel (Completed)   Amb ref to Medical Nutrition Therapy-MNT   Other artificial openings of urinary tract status ([place])    She is followed by [facility] interventional radiology. Nephrostomy tube exchange every 3 months.      Other  Visit Diagnoses     Encounter for screening mammogram for malignant neoplasm of breast       Relevant Orders   MM 3D SCREEN BREAST BILATERAL   Screening for colon cancer       Relevant Orders   Ambulatory referral to Gastroenterology   Scratch       Relevant Orders   Tdap vaccine greater than or equal to 7yo IM (Completed)   Flu vaccine need       Relevant Orders   Flu Vaccine QUAD 6+ mos PF IM (Fluarix Quad PF) (Completed)   Need for Tdap vaccination       Relevant Orders   Tdap vaccine greater than or equal to 7yo IM (Completed)       No follow-ups on file.   Theresia Lo, NP

## 2022-10-19 ENCOUNTER — Ambulatory Visit (INDEPENDENT_AMBULATORY_CARE_PROVIDER_SITE_OTHER): Payer: 59 | Admitting: Nurse Practitioner

## 2022-10-19 ENCOUNTER — Encounter: Payer: Self-pay | Admitting: Nurse Practitioner

## 2022-10-19 VITALS — BP 128/86 | HR 80 | Temp 97.8°F | Resp 16 | Ht 64.0 in | Wt 280.5 lb

## 2022-10-19 DIAGNOSIS — Z23 Encounter for immunization: Secondary | ICD-10-CM | POA: Diagnosis not present

## 2022-10-19 DIAGNOSIS — I10 Essential (primary) hypertension: Secondary | ICD-10-CM | POA: Diagnosis not present

## 2022-10-19 DIAGNOSIS — Z1231 Encounter for screening mammogram for malignant neoplasm of breast: Secondary | ICD-10-CM

## 2022-10-19 DIAGNOSIS — Z1211 Encounter for screening for malignant neoplasm of colon: Secondary | ICD-10-CM | POA: Diagnosis not present

## 2022-10-19 DIAGNOSIS — T148XXA Other injury of unspecified body region, initial encounter: Secondary | ICD-10-CM

## 2022-10-19 DIAGNOSIS — Z936 Other artificial openings of urinary tract status: Secondary | ICD-10-CM | POA: Insufficient documentation

## 2022-10-19 DIAGNOSIS — E46 Unspecified protein-calorie malnutrition: Secondary | ICD-10-CM | POA: Insufficient documentation

## 2022-10-19 LAB — CBC WITH DIFFERENTIAL/PLATELET
Absolute Monocytes: 509 cells/uL (ref 200–950)
Basophils Absolute: 80 cells/uL (ref 0–200)
Basophils Relative: 1.5 %
Eosinophils Absolute: 429 cells/uL (ref 15–500)
Eosinophils Relative: 8.1 %
HCT: 44 % (ref 35.0–45.0)
Hemoglobin: 14.5 g/dL (ref 11.7–15.5)
Lymphs Abs: 1738 cells/uL (ref 850–3900)
MCH: 27.5 pg (ref 27.0–33.0)
MCHC: 33 g/dL (ref 32.0–36.0)
MCV: 83.5 fL (ref 80.0–100.0)
MPV: 9.5 fL (ref 7.5–12.5)
Monocytes Relative: 9.6 %
Neutro Abs: 2544 cells/uL (ref 1500–7800)
Neutrophils Relative %: 48 %
Platelets: 264 10*3/uL (ref 140–400)
RBC: 5.27 10*6/uL — ABNORMAL HIGH (ref 3.80–5.10)
RDW: 13.8 % (ref 11.0–15.0)
Total Lymphocyte: 32.8 %
WBC: 5.3 10*3/uL (ref 3.8–10.8)

## 2022-10-19 LAB — COMPLETE METABOLIC PANEL WITH GFR
AG Ratio: 1.5 (calc) (ref 1.0–2.5)
ALT: 20 U/L (ref 6–29)
AST: 25 U/L (ref 10–35)
Albumin: 4.5 g/dL (ref 3.6–5.1)
Alkaline phosphatase (APISO): 107 U/L (ref 37–153)
BUN/Creatinine Ratio: 20 (calc) (ref 6–22)
BUN: 27 mg/dL — ABNORMAL HIGH (ref 7–25)
CO2: 23 mmol/L (ref 20–32)
Calcium: 9.8 mg/dL (ref 8.6–10.4)
Chloride: 106 mmol/L (ref 98–110)
Creat: 1.38 mg/dL — ABNORMAL HIGH (ref 0.50–1.03)
Globulin: 3.1 g/dL (calc) (ref 1.9–3.7)
Glucose, Bld: 86 mg/dL (ref 65–99)
Potassium: 4.7 mmol/L (ref 3.5–5.3)
Sodium: 140 mmol/L (ref 135–146)
Total Bilirubin: 0.4 mg/dL (ref 0.2–1.2)
Total Protein: 7.6 g/dL (ref 6.1–8.1)
eGFR: 45 mL/min/{1.73_m2} — ABNORMAL LOW (ref >=60)

## 2022-10-19 LAB — LIPID PANEL
Cholesterol: 202 mg/dL — ABNORMAL HIGH (ref <200)
HDL: 47 mg/dL — ABNORMAL LOW (ref >=50)
LDL Cholesterol (Calc): 130 mg/dL (calc) — ABNORMAL HIGH
Non-HDL Cholesterol (Calc): 155 mg/dL (calc) — ABNORMAL HIGH (ref <130)
Total CHOL/HDL Ratio: 4.3 (calc) (ref <5.0)
Triglycerides: 133 mg/dL (ref <150)

## 2022-10-19 LAB — TSH: TSH: 2.62 mIU/L (ref 0.40–4.50)

## 2022-10-19 MED ORDER — AMLODIPINE BESYLATE 2.5 MG PO TABS: 2.5000 mg | ORAL_TABLET | Freq: Every day | ORAL | 0 refills | Status: DC

## 2022-10-20 NOTE — Assessment & Plan Note (Signed)
She is followed by [facility] interventional radiology. Nephrostomy tube exchange every 3 months.

## 2022-10-20 NOTE — Assessment & Plan Note (Signed)
Body mass index is 48.15 kg/m. Advised her to lose weight. Advised patient to avoid trans fat, fatty and fried food. Follow a regular physical activity schedule. Referral sent for nutrition counseling.

## 2022-10-20 NOTE — Assessment & Plan Note (Signed)
Patient BP 128/86 in the office today. Advised her to follow a low sodium and heart healthy diet. Continue amlodipine 2.5 mg daily.

## 2022-11-16 ENCOUNTER — Encounter: Payer: Self-pay | Admitting: Nurse Practitioner

## 2022-11-16 ENCOUNTER — Ambulatory Visit (INDEPENDENT_AMBULATORY_CARE_PROVIDER_SITE_OTHER): Payer: 59 | Admitting: Nurse Practitioner

## 2022-11-16 ENCOUNTER — Other Ambulatory Visit: Payer: Self-pay | Admitting: Nurse Practitioner

## 2022-11-16 VITALS — BP 118/88 | HR 90 | Temp 97.6°F | Resp 97 | Ht 64.0 in | Wt 274.1 lb

## 2022-11-16 DIAGNOSIS — N289 Disorder of kidney and ureter, unspecified: Secondary | ICD-10-CM

## 2022-11-16 DIAGNOSIS — E785 Hyperlipidemia, unspecified: Secondary | ICD-10-CM | POA: Diagnosis not present

## 2022-11-16 MED ORDER — ROSUVASTATIN CALCIUM 5 MG PO TABS: 5.0000 mg | ORAL_TABLET | Freq: Every day | ORAL | 3 refills | Status: DC

## 2022-11-16 NOTE — Progress Notes (Signed)
Established Patient Office Visit  Subjective:  Patient ID: Patricia Rojas, female    DOB: 02-Jul-1966  Age: 56 y.o. MRN: [id number]  CC:  Chief Complaint  Patient presents with   Hypertension    1 month fu. Drops for ear wax was it RX or OTC?     HPI  Patricia Rojas presents for follow-up on blood work.  She is overall doing fine no new concerns at present.  She has lost 4 pounds since last visit.  HPI   Past Medical History:  Diagnosis Date   Anxiety    Cervical cancer ([place]) 2018   internal rad tx's and chemo tx's.    Constipation    Depression    GERD (gastroesophageal reflux disease)    Hemorrhoids    Hypertension    N&V (nausea and vomiting)    PONV (postoperative nausea and vomiting)    PT RECEIVED SCOPLAMINE PATCH FOR 01-09-18 SURGERY AND SHE HAD NO N/V   Renal disorder     Past Surgical History:  Procedure Laterality Date   CESAREAN SECTION  1986   CHOLECYSTECTOMY     CYSTOSCOPY W/ RETROGRADES Right 03/25/2018   Procedure: CYSTOSCOPY WITH RETROGRADE PYELOGRAM;  Surgeon: Hollice Espy, MD;  Location: [facility];  Service: Urology;  Laterality: Right;   CYSTOSCOPY W/ URETERAL STENT PLACEMENT Right 03/25/2018   Procedure: CYSTOSCOPY WITH STENT REPLACEMENT;  Surgeon: Hollice Espy, MD;  Location: [facility];  Service: Urology;  Laterality: Right;   HERNIA REPAIR     IR GENERIC HISTORICAL  02/23/2017   IR NEPHROSTOMY PLACEMENT RIGHT 02/23/2017 [name], MD [facility]   IR NEPHROSTOGRAM RIGHT THRU EXISTING ACCESS  02/15/2018   IR NEPHROSTOMY EXCHANGE RIGHT  04/18/2017   IR NEPHROSTOMY EXCHANGE RIGHT  06/08/2017   IR NEPHROSTOMY EXCHANGE RIGHT  08/17/2017   IR NEPHROSTOMY EXCHANGE RIGHT  10/05/2017   IR NEPHROSTOMY EXCHANGE RIGHT  12/28/2017   IR NEPHROSTOMY PLACEMENT RIGHT  06/05/2018   IR URETERAL STENT PERC REMOVAL MOD SED  06/05/2018   IR URETERAL STENT PLACEMENT EXISTING ACCESS RIGHT  02/15/2018   VULVA Milagros Loll BIOPSY N/A 01/09/2018   Procedure: VAGINAL  AND CERVIX  BIOPSY;  Surgeon: [name], MD;  Location: [facility];  Service: Gynecology;  Laterality: N/A;    Family History  Problem Relation Age of Onset   Cancer Maternal Uncle    Cancer Paternal Aunt    Kidney cancer Neg Hx    Bladder Cancer Neg Hx     Social History   Socioeconomic History   Marital status: Single    Spouse name: Not on file   Number of children: Not on file   Years of education: Not on file   Highest education level: Not on file  Occupational History   Not on file  Tobacco Use   Smoking status: Former    Types: Cigarettes    Quit date: 80    Years since quitting: 33.9   Smokeless tobacco: Never  Vaping Use   Vaping Use: Never used  Substance and Sexual Activity   Alcohol use: No   Drug use: Yes    Types: Marijuana    Comment: every night updated on 11/16/2022   Sexual activity: Not Currently  Other Topics Concern   Not on file  Social History Narrative   Not on file   Social Determinants of Health   Financial Resource Strain: Not on file  Food Insecurity: Not on file  Transportation Needs: Not on file  Physical Activity: Not on file  Stress: Not on file  Social Connections: Not on file  Intimate Partner Violence: Not on file     Outpatient Medications Prior to Visit  Medication Sig Dispense Refill   albuterol (VENTOLIN HFA) 108 (90 Base) MCG/ACT inhaler Inhale 2 puffs into the lungs every 6 (six) hours as needed for wheezing or shortness of breath. 8 g 2   promethazine (PHENERGAN) 25 MG tablet TAKE 1 TABLET (25 MG TOTAL) BY MOUTH EVERY 6 (SIX) HOURS AS NEEDED FOR NAUSEA OR VOMITING. 30 tablet 2   amLODipine (NORVASC) 2.5 MG tablet Take 1 tablet (2.5 mg total) by mouth daily. 30 tablet 0   atorvastatin (LIPITOR) 10 MG tablet Take 10 mg by mouth daily. (Patient not taking: Reported on 10/19/2022)     No facility-administered medications prior to visit.    No Known Allergies  ROS Review of Systems  Constitutional: Negative.   HENT:  Negative.    Eyes: Negative.   Respiratory: Negative.    Cardiovascular:  Negative for leg swelling.  Gastrointestinal: Negative.   Genitourinary:        Nephrostomy tube  Musculoskeletal: Negative.   Neurological: Negative.   Psychiatric/Behavioral: Negative.        Objective:    Physical Exam Constitutional:      Appearance: Normal appearance. She is obese.  HENT:     Right Ear: Tympanic membrane normal.     Left Ear: Tympanic membrane normal.     Mouth/Throat:     Mouth: Mucous membranes are moist.  Eyes:     Conjunctiva/sclera: Conjunctivae normal.     Pupils: Pupils are equal, round, and reactive to light.  Cardiovascular:     Rate and Rhythm: Normal rate and regular rhythm.     Pulses: Normal pulses.     Heart sounds: Normal heart sounds.  Pulmonary:     Effort: Pulmonary effort is normal. No respiratory distress.     Breath sounds: Normal breath sounds. No stridor.  Abdominal:     General: Bowel sounds are normal. There is distension.     Palpations: Abdomen is soft. There is no mass.     Tenderness: There is no abdominal tenderness.  Musculoskeletal:        General: Normal range of motion.  Skin:    General: Skin is warm.  Neurological:     General: No focal deficit present.     Mental Status: She is alert and oriented to person, place, and time. Mental status is at baseline.  Psychiatric:        Mood and Affect: Mood normal.        Behavior: Behavior normal.        Thought Content: Thought content normal.        Judgment: Judgment normal.     BP 118/88   Pulse 90   Temp 97.6 F (36.4 C) (Temporal)   Resp (!) 97   Ht _0  (1.626 m)   Wt 274 lb 1.6 oz (124.3 kg)   LMP 12/18/2016   BMI 47.05 kg/m  Wt Readings from Last 3 Encounters:  11/16/22 274 lb 1.6 oz (124.3 kg)  10/19/22 280 lb 8 oz (127.2 kg)  03/28/22 288 lb 3.2 oz (130.7 kg)     Health Maintenance  Topic Date Due   Medicare Annual Wellness (AWV)  Never done   Hepatitis C  Screening  Never done   COLONOSCOPY (Pts 45-37yr Insurance coverage will need to be confirmed)  Never done   MAMMOGRAM  Never done   PAP SMEAR-Modifier  01/22/2022   COVID-19 Vaccine (1) 12/02/2022 (Originally 05/03/1971)   Zoster Vaccines- Shingrix (1 of 2) 01/19/2023 (Originally 05/02/1985)   DTaP/Tdap/Td (2 - Td or Tdap) 10/19/2032   INFLUENZA VACCINE  Completed   HIV Screening  Completed   HPV VACCINES  Aged Out    There are no preventive care reminders to display for this patient.  Lab Results  Component Value Date   TSH 2.62 10/19/2022   Lab Results  Component Value Date   WBC 5.3 10/19/2022   HGB 14.5 10/19/2022   HCT 44.0 10/19/2022   MCV 83.5 10/19/2022   PLT 264 10/19/2022   Lab Results  Component Value Date   NA 140 10/19/2022   K 4.7 10/19/2022   CO2 23 10/19/2022   GLUCOSE 86 10/19/2022   BUN 27 (H) 10/19/2022   CREATININE 1.38 (H) 10/19/2022   BILITOT 0.4 10/19/2022   ALKPHOS 99 03/23/2022   AST 25 10/19/2022   ALT 20 10/19/2022   PROT 7.6 10/19/2022   ALBUMIN 3.9 03/23/2022   CALCIUM 9.8 10/19/2022   ANIONGAP 5 03/23/2022   EGFR 45 (L) 10/19/2022   Lab Results  Component Value Date   CHOL 202 (H) 10/19/2022   Lab Results  Component Value Date   HDL 47 (L) 10/19/2022   Lab Results  Component Value Date   LDLCALC 130 (H) 10/19/2022   Lab Results  Component Value Date   TRIG 133 10/19/2022   Lab Results  Component Value Date   CHOLHDL 4.3 10/19/2022   No results found for: "HGBA1C"    Assessment & Plan:   Problem List Items Addressed This Visit       Genitourinary   Renal insufficiency    Her creatinine 1.38, BUN 27, e GFR 45 on 10/19/2022 and her previous creatinine 1.6, BUN creatinine ratio 9, eGFR 38 on 10/10/2021. Will repeat lab in 3 months.        Other   Dyslipidemia - Primary    She has elevated total cholesterol and LDL and low HDL. Encouraged her to avoid fatty and fried food. Perform at least 150 minutes of  physical activity in a week. Continue rosuvastatin 5 mg for cardiovascular risk reduction.      Relevant Medications   rosuvastatin (CRESTOR) 5 MG tablet     Meds ordered this encounter  Medications   rosuvastatin (CRESTOR) 5 MG tablet    Sig: Take 1 tablet (5 mg total) by mouth daily.    Dispense:  90 tablet    Refill:  3     Follow-up: No follow-ups on file.    Theresia Lo, NP

## 2022-11-20 ENCOUNTER — Encounter: Payer: Self-pay | Admitting: Nurse Practitioner

## 2022-11-20 DIAGNOSIS — E785 Hyperlipidemia, unspecified: Secondary | ICD-10-CM | POA: Insufficient documentation

## 2022-11-20 DIAGNOSIS — N289 Disorder of kidney and ureter, unspecified: Secondary | ICD-10-CM | POA: Insufficient documentation

## 2022-11-20 NOTE — Assessment & Plan Note (Addendum)
She has elevated total cholesterol and LDL and low HDL. Encouraged her to avoid fatty and fried food. Perform at least 150 minutes of physical activity in a week. Continue rosuvastatin 5 mg for cardiovascular risk reduction.

## 2022-11-20 NOTE — Assessment & Plan Note (Addendum)
Her creatinine 1.38, BUN 27, e GFR 45 on 10/19/2022 and her previous creatinine 1.6, BUN creatinine ratio 9, eGFR 38 on 10/10/2021. Will repeat lab in 3 months.

## 2022-12-13 ENCOUNTER — Other Ambulatory Visit: Payer: Self-pay | Admitting: *Deleted

## 2022-12-13 ENCOUNTER — Telehealth: Payer: Self-pay | Admitting: *Deleted

## 2022-12-13 DIAGNOSIS — Z1211 Encounter for screening for malignant neoplasm of colon: Secondary | ICD-10-CM

## 2022-12-13 MED ORDER — PEG 3350-KCL-NABCB-NACL-NASULF 236 G PO SOLR: 4000.0000 mL | Freq: Once | ORAL | 0 refills | Status: AC

## 2022-12-13 NOTE — Telephone Encounter (Signed)
Gastroenterology Pre-Procedure Review  Request Date: 01/12/2023 Requesting Physician: Dr. Vicente Males  PATIENT REVIEW QUESTIONS: The patient responded to the following health history questions as indicated:    1. Are you having any GI issues? no 2. Do you have a personal history of Polyps? no 3. Do you have a family history of Colon Cancer or Polyps? no 4. Diabetes Mellitus? no 5. Joint replacements in the past 12 months?no 6. Major health problems in the past 3 months?no 7. Any artificial heart valves, MVP, or defibrillator?no    MEDICATIONS & ALLERGIES:    Patient reports the following regarding taking any anticoagulation/antiplatelet therapy:   Plavix, Coumadin, Eliquis, Xarelto, Lovenox, Pradaxa, Brilinta, or Effient? no Aspirin? no  Patient confirms/reports the following medications:  Current Outpatient Medications  Medication Sig Dispense Refill   polyethylene glycol (GOLYTELY) 236 g solution Take 4,000 mLs by mouth once for 1 dose. 4000 mL 0   albuterol (VENTOLIN HFA) 108 (90 Base) MCG/ACT inhaler Inhale 2 puffs into the lungs every 6 (six) hours as needed for wheezing or shortness of breath. 8 g 2   amLODipine (NORVASC) 2.5 MG tablet TAKE 1 TABLET BY MOUTH EVERY DAY 90 tablet 1   promethazine (PHENERGAN) 25 MG tablet TAKE 1 TABLET (25 MG TOTAL) BY MOUTH EVERY 6 (SIX) HOURS AS NEEDED FOR NAUSEA OR VOMITING. 30 tablet 2   rosuvastatin (CRESTOR) 5 MG tablet Take 1 tablet (5 mg total) by mouth daily. 90 tablet 3   No current facility-administered medications for this visit.    Patient confirms/reports the following allergies:  No Known Allergies  No orders of the defined types were placed in this encounter.   AUTHORIZATION INFORMATION Primary Insurance: 1D#: Group #:  Secondary Insurance: 1D#: Group #:  SCHEDULE INFORMATION: Date: 01/12/2023 Time: Location: [place]

## 2022-12-21 LAB — HM MAMMOGRAPHY

## 2023-01-01 ENCOUNTER — Ambulatory Visit: Payer: 59 | Admitting: Dietician

## 2023-01-10 ENCOUNTER — Telehealth: Payer: Self-pay | Admitting: Gastroenterology

## 2023-01-10 NOTE — Telephone Encounter (Signed)
Pt has prep questions would like a call back [phone]

## 2023-01-10 NOTE — Telephone Encounter (Signed)
Colonoscopy instructions reviewed with patient.  She did not realize her instructions were in the envelope.  Discussed how and when to start prep, and dietary instructions.  Asked her to call me back if she is unclear on any of this information.  Thanks, [place], [place]

## 2023-01-11 ENCOUNTER — Encounter: Payer: Self-pay | Admitting: Gastroenterology

## 2023-01-12 ENCOUNTER — Encounter
Admission: RE | Disposition: A | Discharge status: Home / Self Care | Payer: Self-pay | Source: Home / Self Care | Attending: Gastroenterology

## 2023-01-12 ENCOUNTER — Ambulatory Visit: Payer: 59 | Admitting: Registered Nurse

## 2023-01-12 ENCOUNTER — Ambulatory Visit
Admission: RE | Admit: 2023-01-12 | Discharge: 2023-01-12 | Disposition: A | Discharge status: Home / Self Care | Payer: 59 | Attending: Gastroenterology | Admitting: Gastroenterology

## 2023-01-12 ENCOUNTER — Encounter: Payer: Self-pay | Admitting: Gastroenterology

## 2023-01-12 DIAGNOSIS — Z87891 Personal history of nicotine dependence: Secondary | ICD-10-CM | POA: Diagnosis not present

## 2023-01-12 DIAGNOSIS — Z1211 Encounter for screening for malignant neoplasm of colon: Secondary | ICD-10-CM | POA: Diagnosis present

## 2023-01-12 DIAGNOSIS — K640 First degree hemorrhoids: Secondary | ICD-10-CM | POA: Insufficient documentation

## 2023-01-12 DIAGNOSIS — K219 Gastro-esophageal reflux disease without esophagitis: Secondary | ICD-10-CM | POA: Insufficient documentation

## 2023-01-12 DIAGNOSIS — K5730 Diverticulosis of large intestine without perforation or abscess without bleeding: Secondary | ICD-10-CM | POA: Diagnosis not present

## 2023-01-12 DIAGNOSIS — D124 Benign neoplasm of descending colon: Secondary | ICD-10-CM | POA: Diagnosis not present

## 2023-01-12 DIAGNOSIS — I10 Essential (primary) hypertension: Secondary | ICD-10-CM | POA: Diagnosis not present

## 2023-01-12 DIAGNOSIS — Z8541 Personal history of malignant neoplasm of cervix uteri: Secondary | ICD-10-CM | POA: Insufficient documentation

## 2023-01-12 DIAGNOSIS — D126 Benign neoplasm of colon, unspecified: Secondary | ICD-10-CM

## 2023-01-12 DIAGNOSIS — F1290 Cannabis use, unspecified, uncomplicated: Secondary | ICD-10-CM | POA: Insufficient documentation

## 2023-01-12 HISTORY — PX: COLONOSCOPY WITH PROPOFOL: SHX5780

## 2023-01-12 SURGERY — COLONOSCOPY WITH PROPOFOL
Anesthesia: General

## 2023-01-12 MED ORDER — STERILE WATER FOR IRRIGATION IR SOLN
Status: DC | PRN
  Administered 2023-01-12 (×2): 60 mL

## 2023-01-12 MED ORDER — PROPOFOL 10 MG/ML IV BOLUS
INTRAVENOUS | Status: DC | PRN
  Administered 2023-01-12: 100 mg via INTRAVENOUS

## 2023-01-12 MED ORDER — PROPOFOL 500 MG/50ML IV EMUL
INTRAVENOUS | Status: DC | PRN
  Administered 2023-01-12: 150 ug/kg/min via INTRAVENOUS

## 2023-01-12 MED ORDER — PROPOFOL 1000 MG/100ML IV EMUL
INTRAVENOUS | Status: AC
  Filled 2023-01-12: qty 100

## 2023-01-12 MED ORDER — LIDOCAINE HCL (CARDIAC) PF 100 MG/5ML IV SOSY
PREFILLED_SYRINGE | INTRAVENOUS | Status: DC | PRN
  Administered 2023-01-12: 40 mg via INTRAVENOUS

## 2023-01-12 MED ORDER — LIDOCAINE HCL (PF) 2 % IJ SOLN
INTRAMUSCULAR | Status: AC
  Filled 2023-01-12: qty 5

## 2023-01-12 MED ORDER — DEXMEDETOMIDINE HCL IN NACL 200 MCG/50ML IV SOLN
INTRAVENOUS | Status: DC | PRN
  Administered 2023-01-12: 8 ug via INTRAVENOUS

## 2023-01-12 MED ORDER — SODIUM CHLORIDE 0.9 % IV SOLN
INTRAVENOUS | Status: DC
  Administered 2023-01-12: 20 mL/h via INTRAVENOUS

## 2023-01-12 NOTE — Anesthesia Preprocedure Evaluation (Signed)
Anesthesia Evaluation  Patient identified by MRN, date of birth, ID band  Reviewed: Allergy & Precautions, NPO status , Patient's Chart, lab work & pertinent test results  History of Anesthesia Complications (+) PONV and history of anesthetic complications  Airway Mallampati: II       Dental  (+) Chipped, Dental Advidsory Given   Pulmonary neg pulmonary ROS, neg sleep apnea, neg COPD, former smoker          Cardiovascular hypertension, Pt. on medications (-) angina (-) Past MI and (-) CHF (-) dysrhythmias (-) Valvular Problems/Murmurs     Neuro/Psych neg Seizures PSYCHIATRIC DISORDERS Anxiety Depression       GI/Hepatic Neg liver ROS,GERD  Medicated,,  Endo/Other  neg diabetes    Renal/GU Renal disease     Musculoskeletal   Abdominal   Peds  Hematology   Anesthesia Other Findings Past Medical History: No date: Anxiety 2018: Cervical cancer ([place])     Comment:  internal rad tx's and chemo tx's.  No date: Constipation No date: Depression No date: GERD (gastroesophageal reflux disease) No date: Hemorrhoids No date: Hypertension No date: N&V (nausea and vomiting) No date: PONV (postoperative nausea and vomiting)     Comment:  PT RECEIVED SCOPLAMINE PATCH FOR 01-09-18 SURGERY AND SHE              HAD NO N/V No date: Renal disorder   Reproductive/Obstetrics negative OB ROS                             Anesthesia Physical Anesthesia Plan  ASA: 3  Anesthesia Plan: General   Post-op Pain Management:    Induction: Intravenous  PONV Risk Score and Plan: 4 or greater and Propofol infusion and TIVA  Airway Management Planned: Natural Airway and Nasal Cannula  Additional Equipment:   Intra-op Plan:   Post-operative Plan:   Informed Consent: I have reviewed the patients History and Physical, chart, labs and discussed the procedure including the risks, benefits and alternatives for the  proposed anesthesia with the patient or authorized representative who has indicated his/her understanding and acceptance.       Plan Discussed with:   Anesthesia Plan Comments:         Anesthesia Quick Evaluation

## 2023-01-12 NOTE — H&P (Signed)
Jonathon Bellows, MD [address], [place], [place], [place], [address] [address], [place], [place], [place], [address] Phone: [phone]  Fax: [phone]  Primary Care Physician:  Theresia Lo, NP   Pre-Procedure History & Physical: HPI:  Patricia Rojas is a 57 y.o. female is here for an colonoscopy.   Past Medical History:  Diagnosis Date   Anxiety    Cervical cancer ([place]) 2018   internal rad tx's and chemo tx's.    Constipation    Depression    GERD (gastroesophageal reflux disease)    Hemorrhoids    Hypertension    N&V (nausea and vomiting)    PONV (postoperative nausea and vomiting)    PT RECEIVED SCOPLAMINE PATCH FOR 01-09-18 SURGERY AND SHE HAD NO N/V   Renal disorder     Past Surgical History:  Procedure Laterality Date   CESAREAN SECTION  1986   CHOLECYSTECTOMY     CYSTOSCOPY W/ RETROGRADES Right 03/25/2018   Procedure: CYSTOSCOPY WITH RETROGRADE PYELOGRAM;  Surgeon: Hollice Espy, MD;  Location: [facility];  Service: Urology;  Laterality: Right;   CYSTOSCOPY W/ URETERAL STENT PLACEMENT Right 03/25/2018   Procedure: CYSTOSCOPY WITH STENT REPLACEMENT;  Surgeon: Hollice Espy, MD;  Location: [facility];  Service: Urology;  Laterality: Right;   HERNIA REPAIR     IR GENERIC HISTORICAL  02/23/2017   IR NEPHROSTOMY PLACEMENT RIGHT 02/23/2017 [name], MD [facility]   IR NEPHROSTOGRAM RIGHT THRU EXISTING ACCESS  02/15/2018   IR NEPHROSTOMY EXCHANGE RIGHT  04/18/2017   IR NEPHROSTOMY EXCHANGE RIGHT  06/08/2017   IR NEPHROSTOMY EXCHANGE RIGHT  08/17/2017   IR NEPHROSTOMY EXCHANGE RIGHT  10/05/2017   IR NEPHROSTOMY EXCHANGE RIGHT  12/28/2017   IR NEPHROSTOMY PLACEMENT RIGHT  06/05/2018   IR URETERAL STENT PERC REMOVAL MOD SED  06/05/2018   IR URETERAL STENT PLACEMENT EXISTING ACCESS RIGHT  02/15/2018   VULVA Milagros Loll BIOPSY N/A 01/09/2018   Procedure: VAGINAL  AND CERVIX BIOPSY;  Surgeon: [name], MD;  Location: [facility];  Service: Gynecology;   Laterality: N/A;    Prior to Admission medications   Medication Sig Start Date End Date Taking? Authorizing Provider  albuterol (VENTOLIN HFA) 108 (90 Base) MCG/ACT inhaler Inhale 2 puffs into the lungs every 6 (six) hours as needed for wheezing or shortness of breath. 12/09/21  Yes Lucrezia Starch, MD  amLODipine (NORVASC) 2.5 MG tablet TAKE 1 TABLET BY MOUTH EVERY DAY 11/16/22  Yes Theresia Lo, NP  promethazine (PHENERGAN) 25 MG tablet TAKE 1 TABLET (25 MG TOTAL) BY MOUTH EVERY 6 (SIX) HOURS AS NEEDED FOR NAUSEA OR VOMITING. 03/28/21  Yes Jacquelin Hawking, NP  rosuvastatin (CRESTOR) 5 MG tablet Take 1 tablet (5 mg total) by mouth daily. 11/16/22  Yes Theresia Lo, NP    Allergies as of 12/13/2022   (No Known Allergies)    Family History  Problem Relation Age of Onset   Cancer Maternal Uncle    Cancer Paternal Aunt    Kidney cancer Neg Hx    Bladder Cancer Neg Hx     Social History   Socioeconomic History   Marital status: Single    Spouse name: Not on file   Number of children: Not on file   Years of education: Not on file   Highest education level: Not on file  Occupational History   Not on file  Tobacco Use   Smoking status: Former    Types: Cigarettes  Quit date: 46    Years since quitting: 34.1   Smokeless tobacco: Never  Vaping Use   Vaping Use: Never used  Substance and Sexual Activity   Alcohol use: No   Drug use: Yes    Types: Marijuana    Comment: every night updated on 11/16/2022   Sexual activity: Not Currently  Other Topics Concern   Not on file  Social History Narrative   Not on file   Social Determinants of Health   Financial Resource Strain: Not on file  Food Insecurity: Not on file  Transportation Needs: Not on file  Physical Activity: Not on file  Stress: Not on file  Social Connections: Not on file  Intimate Partner Violence: Not on file    Review of Systems: See HPI, otherwise negative ROS  Physical Exam: BP (!)  156/105   Pulse 81   Temp 97.9 F (36.6 C) (Temporal)   Resp 16   Ht '5\' 5"'$  (1.651 m)   Wt 102.7 kg   LMP 12/18/2016   SpO2 98%   BMI 37.69 kg/m  General:   Alert,  pleasant and cooperative in NAD Head:  Normocephalic and atraumatic. Neck:  Supple; no masses or thyromegaly. Lungs:  Clear throughout to auscultation, normal respiratory effort.    Heart:  +S1, +S2, Regular rate and rhythm, No edema. Abdomen:  Soft, nontender and nondistended. Normal bowel sounds, without guarding, and without rebound.   Neurologic:  Alert and  oriented x4;  grossly normal neurologically.  Impression/Plan: Patricia Rojas is here for an colonoscopy to be performed for Screening colonoscopy average risk   Risks, benefits, limitations, and alternatives regarding  colonoscopy have been reviewed with the patient.  Questions have been answered.  All parties agreeable.   Jonathon Bellows, MD  01/12/2023, 7:49 AM

## 2023-01-12 NOTE — Anesthesia Procedure Notes (Signed)
Date/Time: 01/12/2023 8:00 AM  Performed by: Doreen Salvage, CRNAPre-anesthesia Checklist: Patient identified, Emergency Drugs available, Suction available and Patient being monitored Patient Re-evaluated:Patient Re-evaluated prior to induction Oxygen Delivery Method: Nasal cannula Induction Type: IV induction Dental Injury: Teeth and Oropharynx as per pre-operative assessment  Comments: Nasal cannula with etCO2 monitoring

## 2023-01-12 NOTE — Transfer of Care (Signed)
Immediate Anesthesia Transfer of Care Note  Patient: Ardene A Nault  Procedure(s) Performed: Procedure(s): COLONOSCOPY WITH PROPOFOL (N/A)  Patient Location: PACU and Endoscopy Unit  Anesthesia Type:General  Level of Consciousness: sedated  Airway & Oxygen Therapy: Patient Spontanous Breathing and Patient connected to nasal cannula oxygen  Post-op Assessment: Report given to RN and Post -op Vital signs reviewed and stable  Post vital signs: Reviewed and stable  Last Vitals:  Vitals:   01/12/23 0650 01/12/23 0818  BP: (!) 156/105 108/67  Pulse: 81 78  Resp: 16 19  Temp: 36.6 C   SpO2: 34% 19%    Complications: No apparent anesthesia complications

## 2023-01-12 NOTE — Op Note (Signed)
[facility] Gastroenterology Patient Name: Patricia Rojas Procedure Date: 01/12/2023 7:53 AM MRN: [id number] Account #: [id number] Date of Birth: 28-Dec-1965 Admit Type: Outpatient Age: 57 Room: [facility] ENDO ROOM 4 Gender: Female Note Status: Finalized Instrument Name: Park Meo [id number] Procedure:             Colonoscopy Indications:           Screening for colorectal malignant neoplasm Providers:             Jonathon Bellows MD, MD Medicines:             Monitored Anesthesia Care Complications:         No immediate complications. Procedure:             Pre-Anesthesia Assessment:                        - Prior to the procedure, a History and Physical was                         performed, and patient medications, allergies and                         sensitivities were reviewed. The patient's tolerance                         of previous anesthesia was reviewed.                        - The risks and benefits of the procedure and the                         sedation options and risks were discussed with the                         patient. All questions were answered and informed                         consent was obtained.                        - ASA Grade Assessment: II - A patient with mild                         systemic disease.                        After obtaining informed consent, the colonoscope was                         passed under direct vision. Throughout the procedure,                         the patient's blood pressure, pulse, and oxygen                         saturations were monitored continuously. The                         Colonoscope was introduced through the anus and  advanced to the the cecum, identified by the                         appendiceal orifice. The colonoscopy was performed                         with ease. The patient tolerated the procedure well.                         The quality of the bowel preparation was  good. The                         ileocecal valve, appendiceal orifice, and rectum were                         photographed. Findings:      The perianal and digital rectal examinations were normal.      A 5 mm polyp was found in the descending colon. The polyp was sessile.       The polyp was removed with a cold snare. Resection and retrieval were       complete.      Multiple medium-mouthed diverticula were found in the entire colon.      Non-bleeding internal hemorrhoids were found during retroflexion. The       hemorrhoids were large and Grade I (internal hemorrhoids that do not       prolapse).      The exam was otherwise without abnormality on direct and retroflexion       views. Impression:            - One 5 mm polyp in the descending colon, removed with                         a cold snare. Resected and retrieved.                        - Diverticulosis in the entire examined colon.                        - Non-bleeding internal hemorrhoids.                        - The examination was otherwise normal on direct and                         retroflexion views. Recommendation:        - Discharge patient to home (with escort).                        - Resume previous diet.                        - Continue present medications.                        - Await pathology results.                        - Repeat colonoscopy for surveillance based on  pathology results. Procedure Code(s):     --- Professional ---                        [phone], Colonoscopy, flexible; with removal of                         tumor(s), polyp(s), or other lesion(s) by snare                         technique Diagnosis Code(s):     --- Professional ---                        Z12.11, Encounter for screening for malignant neoplasm                         of colon                        D12.4, Benign neoplasm of descending colon                        K57.30, Diverticulosis of large  intestine without                         perforation or abscess without bleeding                        K64.0, First degree hemorrhoids CPT copyright 2022 American [employer] Association. All rights reserved. The codes documented in this report are preliminary and upon coder review may  be revised to meet current compliance requirements. Jonathon Bellows, MD Jonathon Bellows MD, MD 01/12/2023 8:14:55 AM This report has been signed electronically. Number of Addenda: 0 Note Initiated On: 01/12/2023 7:53 AM Scope Withdrawal Time: 0 hours 8 minutes 34 seconds  Total Procedure Duration: 0 hours 16 minutes 3 seconds  Estimated Blood Loss:  Estimated blood loss: none.      [facility]

## 2023-01-15 ENCOUNTER — Encounter: Payer: Self-pay | Admitting: Gastroenterology

## 2023-01-15 LAB — SURGICAL PATHOLOGY

## 2023-02-01 NOTE — Anesthesia Postprocedure Evaluation (Signed)
Anesthesia Post Note  Patient: Patricia Rojas  Procedure(s) Performed: COLONOSCOPY WITH PROPOFOL  Patient location during evaluation: Endoscopy Anesthesia Type: General Level of consciousness: awake and alert Pain management: pain level controlled Vital Signs Assessment: post-procedure vital signs reviewed and stable Respiratory status: spontaneous breathing, nonlabored ventilation, respiratory function stable and patient connected to nasal cannula oxygen Cardiovascular status: blood pressure returned to baseline and stable Postop Assessment: no apparent nausea or vomiting Anesthetic complications: no   No notable events documented.   Last Vitals:  Vitals:   01/12/23 0836 01/12/23 0839  BP: (!) 157/55   Pulse:  75  Resp:    Temp: (!) 36.1 C   SpO2:      Last Pain:  Vitals:   01/13/23 1204  TempSrc:   PainSc: 0-No pain                 Martha Clan

## 2023-05-19 ENCOUNTER — Other Ambulatory Visit: Payer: Self-pay | Admitting: Nurse Practitioner

## 2023-06-03 ENCOUNTER — Other Ambulatory Visit: Payer: Self-pay | Admitting: Nurse Practitioner

## 2023-06-05 NOTE — Telephone Encounter (Signed)
Need follow up

## 2023-06-13 MED ORDER — AMLODIPINE BESYLATE 2.5 MG PO TABS: 2.5000 mg | ORAL_TABLET | Freq: Every day | ORAL | 3 refills | Status: DC

## 2023-10-09 ENCOUNTER — Other Ambulatory Visit: Payer: Self-pay | Admitting: Nurse Practitioner

## 2023-10-12 ENCOUNTER — Encounter: Payer: Self-pay | Admitting: Pharmacist

## 2023-10-12 NOTE — Progress Notes (Signed)
Pharmacy Quality Measure Review  This patient is appearing on a report for being at risk of failing the adherence measure for cholesterol (statin) medications this calendar year.   Medication: Rosuvastatin 5 mg daily Last fill date: 07/07/23 for 90 day supply  Insurance report was not up to date. No action needed at this time.  Patient has refilled this medication as of 09/28/23, prior to adherence deadline.

## 2023-12-08 ENCOUNTER — Emergency Department: Payer: 59

## 2023-12-08 ENCOUNTER — Other Ambulatory Visit: Payer: Self-pay

## 2023-12-08 ENCOUNTER — Emergency Department: Payer: 59 | Admitting: Anesthesiology

## 2023-12-08 ENCOUNTER — Ambulatory Visit
Admission: EM | Admit: 2023-12-08 | Discharge: 2023-12-08 | Disposition: A | Discharge status: Home / Self Care | Payer: 59 | Attending: Emergency Medicine | Admitting: Emergency Medicine

## 2023-12-08 ENCOUNTER — Encounter
Admission: EM | Disposition: A | Discharge status: Home / Self Care | Payer: Self-pay | Source: Home / Self Care | Attending: Emergency Medicine

## 2023-12-08 DIAGNOSIS — K5730 Diverticulosis of large intestine without perforation or abscess without bleeding: Secondary | ICD-10-CM | POA: Diagnosis not present

## 2023-12-08 DIAGNOSIS — K460 Unspecified abdominal hernia with obstruction, without gangrene: Secondary | ICD-10-CM

## 2023-12-08 DIAGNOSIS — K56699 Other intestinal obstruction unspecified as to partial versus complete obstruction: Secondary | ICD-10-CM | POA: Diagnosis not present

## 2023-12-08 DIAGNOSIS — I498 Other specified cardiac arrhythmias: Secondary | ICD-10-CM | POA: Insufficient documentation

## 2023-12-08 DIAGNOSIS — K430 Incisional hernia with obstruction, without gangrene: Secondary | ICD-10-CM | POA: Insufficient documentation

## 2023-12-08 DIAGNOSIS — F419 Anxiety disorder, unspecified: Secondary | ICD-10-CM | POA: Insufficient documentation

## 2023-12-08 DIAGNOSIS — I700 Atherosclerosis of aorta: Secondary | ICD-10-CM | POA: Insufficient documentation

## 2023-12-08 DIAGNOSIS — Z87891 Personal history of nicotine dependence: Secondary | ICD-10-CM | POA: Insufficient documentation

## 2023-12-08 DIAGNOSIS — Z860101 Personal history of adenomatous and serrated colon polyps: Secondary | ICD-10-CM | POA: Diagnosis not present

## 2023-12-08 DIAGNOSIS — I10 Essential (primary) hypertension: Secondary | ICD-10-CM | POA: Insufficient documentation

## 2023-12-08 DIAGNOSIS — E66813 Obesity, class 3: Secondary | ICD-10-CM | POA: Insufficient documentation

## 2023-12-08 DIAGNOSIS — K219 Gastro-esophageal reflux disease without esophagitis: Secondary | ICD-10-CM | POA: Insufficient documentation

## 2023-12-08 DIAGNOSIS — Z6841 Body Mass Index (BMI) 40.0 and over, adult: Secondary | ICD-10-CM | POA: Diagnosis not present

## 2023-12-08 DIAGNOSIS — K56609 Unspecified intestinal obstruction, unspecified as to partial versus complete obstruction: Secondary | ICD-10-CM

## 2023-12-08 DIAGNOSIS — Z79899 Other long term (current) drug therapy: Secondary | ICD-10-CM | POA: Insufficient documentation

## 2023-12-08 DIAGNOSIS — Z8541 Personal history of malignant neoplasm of cervix uteri: Secondary | ICD-10-CM | POA: Diagnosis not present

## 2023-12-08 HISTORY — PX: INSERTION OF MESH: SHX5868

## 2023-12-08 HISTORY — PX: XI ROBOTIC ASSISTED VENTRAL HERNIA: SHX6789

## 2023-12-08 HISTORY — DX: Unspecified abdominal hernia with obstruction, without gangrene: K46.0

## 2023-12-08 LAB — URINALYSIS, ROUTINE W REFLEX MICROSCOPIC
Bacteria, UA: NONE SEEN
Bilirubin Urine: NEGATIVE
Glucose, UA: NEGATIVE mg/dL
Hgb urine dipstick: NEGATIVE
Ketones, ur: NEGATIVE mg/dL
Leukocytes,Ua: NEGATIVE
Nitrite: NEGATIVE
Protein, ur: 30 mg/dL — AB
Specific Gravity, Urine: 1.023 (ref 1.005–1.030)
pH: 5 (ref 5.0–8.0)

## 2023-12-08 LAB — CBC
HCT: 46.3 % — ABNORMAL HIGH (ref 36.0–46.0)
Hemoglobin: 14.9 g/dL (ref 12.0–15.0)
MCH: 27.6 pg (ref 26.0–34.0)
MCHC: 32.2 g/dL (ref 30.0–36.0)
MCV: 85.9 fL (ref 80.0–100.0)
Platelets: 212 10*3/uL (ref 150–400)
RBC: 5.39 MIL/uL — ABNORMAL HIGH (ref 3.87–5.11)
RDW: 13.9 % (ref 11.5–15.5)
WBC: 5.1 10*3/uL (ref 4.0–10.5)
nRBC: 0 % (ref 0.0–0.2)

## 2023-12-08 LAB — COMPREHENSIVE METABOLIC PANEL
ALT: 20 U/L (ref 0–44)
AST: 24 U/L (ref 15–41)
Albumin: 4.1 g/dL (ref 3.5–5.0)
Alkaline Phosphatase: 90 U/L (ref 38–126)
Anion gap: 10 (ref 5–15)
BUN: 27 mg/dL — ABNORMAL HIGH (ref 6–20)
CO2: 23 mmol/L (ref 22–32)
Calcium: 9.3 mg/dL (ref 8.9–10.3)
Chloride: 109 mmol/L (ref 98–111)
Creatinine, Ser: 1.23 mg/dL — ABNORMAL HIGH (ref 0.44–1.00)
GFR, Estimated: 51 mL/min — ABNORMAL LOW (ref >60)
Glucose, Bld: 107 mg/dL — ABNORMAL HIGH (ref 70–99)
Potassium: 4.2 mmol/L (ref 3.5–5.1)
Sodium: 142 mmol/L (ref 135–145)
Total Bilirubin: 0.9 mg/dL (ref <1.2)
Total Protein: 8.2 g/dL — ABNORMAL HIGH (ref 6.5–8.1)

## 2023-12-08 LAB — LACTIC ACID, PLASMA: Lactic Acid, Venous: 1.2 mmol/L (ref 0.5–1.9)

## 2023-12-08 LAB — LIPASE, BLOOD: Lipase: 42 U/L (ref 11–51)

## 2023-12-08 SURGERY — REPAIR, HERNIA, VENTRAL, ROBOT-ASSISTED
Anesthesia: General | Site: Abdomen

## 2023-12-08 MED ORDER — ROCURONIUM BROMIDE 100 MG/10ML IV SOLN
INTRAVENOUS | Status: DC | PRN
  Administered 2023-12-08: 70 mg via INTRAVENOUS

## 2023-12-08 MED ORDER — SODIUM CHLORIDE 0.9 % IV SOLN
INTRAVENOUS | Status: AC
  Filled 2023-12-08: qty 2

## 2023-12-08 MED ORDER — PROPOFOL 10 MG/ML IV BOLUS
INTRAVENOUS | Status: AC
  Filled 2023-12-08: qty 20

## 2023-12-08 MED ORDER — MEPERIDINE HCL 25 MG/ML IJ SOLN
INTRAMUSCULAR | Status: AC
  Filled 2023-12-08: qty 1

## 2023-12-08 MED ORDER — MIDAZOLAM HCL 2 MG/2ML IJ SOLN
INTRAMUSCULAR | Status: AC
  Filled 2023-12-08: qty 2

## 2023-12-08 MED ORDER — IOHEXOL 300 MG/ML  SOLN
100.0000 mL | Freq: Once | INTRAMUSCULAR | Status: AC | PRN
  Administered 2023-12-08: 100 mL via INTRAVENOUS

## 2023-12-08 MED ORDER — DEXAMETHASONE SODIUM PHOSPHATE 10 MG/ML IJ SOLN
INTRAMUSCULAR | Status: DC | PRN
  Administered 2023-12-08: 10 mg via INTRAVENOUS

## 2023-12-08 MED ORDER — IBUPROFEN 800 MG PO TABS: 800.0000 mg | ORAL_TABLET | Freq: Three times a day (TID) | ORAL | 0 refills | Status: DC | PRN

## 2023-12-08 MED ORDER — SEVOFLURANE IN SOLN
RESPIRATORY_TRACT | Status: AC
  Filled 2023-12-08: qty 250

## 2023-12-08 MED ORDER — ONDANSETRON HCL 4 MG/2ML IJ SOLN
INTRAMUSCULAR | Status: AC
  Filled 2023-12-08: qty 2

## 2023-12-08 MED ORDER — FENTANYL CITRATE (PF) 100 MCG/2ML IJ SOLN
INTRAMUSCULAR | Status: DC | PRN
  Administered 2023-12-08 (×2): 50 ug via INTRAVENOUS

## 2023-12-08 MED ORDER — ONDANSETRON HCL 4 MG/2ML IJ SOLN
4.0000 mg | Freq: Once | INTRAMUSCULAR | Status: AC
  Administered 2023-12-08: 4 mg via INTRAVENOUS
  Filled 2023-12-08: qty 2

## 2023-12-08 MED ORDER — SCOPOLAMINE 1 MG/3DAYS TD PT72
MEDICATED_PATCH | TRANSDERMAL | Status: AC
  Filled 2023-12-08: qty 1

## 2023-12-08 MED ORDER — OXYCODONE HCL 5 MG PO TABS
5.0000 mg | ORAL_TABLET | Freq: Once | ORAL | Status: AC | PRN
  Administered 2023-12-08: 5 mg via ORAL

## 2023-12-08 MED ORDER — OXYCODONE HCL 5 MG/5ML PO SOLN: 5.0000 mg | Freq: Once | ORAL | Status: AC | PRN

## 2023-12-08 MED ORDER — LACTATED RINGERS IV SOLN: INTRAVENOUS | Status: DC | PRN

## 2023-12-08 MED ORDER — DROPERIDOL 2.5 MG/ML IJ SOLN
0.6250 mg | Freq: Once | INTRAMUSCULAR | Status: AC | PRN
  Administered 2023-12-08: 0.625 mg via INTRAVENOUS

## 2023-12-08 MED ORDER — ACETAMINOPHEN 10 MG/ML IV SOLN
INTRAVENOUS | Status: DC | PRN
  Administered 2023-12-08: 1000 mg via INTRAVENOUS

## 2023-12-08 MED ORDER — FENTANYL CITRATE (PF) 100 MCG/2ML IJ SOLN
INTRAMUSCULAR | Status: AC
  Filled 2023-12-08: qty 2

## 2023-12-08 MED ORDER — MEPERIDINE HCL 25 MG/ML IJ SOLN
6.2500 mg | INTRAMUSCULAR | Status: DC | PRN
  Administered 2023-12-08: 6.25 mg via INTRAVENOUS

## 2023-12-08 MED ORDER — OXYCODONE HCL 5 MG PO TABS
ORAL_TABLET | ORAL | Status: AC
  Filled 2023-12-08: qty 1

## 2023-12-08 MED ORDER — PROMETHAZINE HCL 25 MG/ML IJ SOLN
12.5000 mg | Freq: Once | INTRAMUSCULAR | Status: AC
  Administered 2023-12-08: 12.5 mg via INTRAVENOUS
  Filled 2023-12-08: qty 0.5

## 2023-12-08 MED ORDER — SCOPOLAMINE 1 MG/3DAYS TD PT72
1.0000 | MEDICATED_PATCH | Freq: Once | TRANSDERMAL | Status: DC
  Administered 2023-12-08: 1.5 mg via TRANSDERMAL

## 2023-12-08 MED ORDER — LIDOCAINE HCL (CARDIAC) PF 100 MG/5ML IV SOSY
PREFILLED_SYRINGE | INTRAVENOUS | Status: DC | PRN
  Administered 2023-12-08: 100 mg via INTRAVENOUS

## 2023-12-08 MED ORDER — HYDROMORPHONE HCL 1 MG/ML IJ SOLN
1.0000 mg | Freq: Once | INTRAMUSCULAR | Status: AC
  Administered 2023-12-08: 1 mg via INTRAVENOUS
  Filled 2023-12-08: qty 1

## 2023-12-08 MED ORDER — FENTANYL CITRATE PF 50 MCG/ML IJ SOSY: 50.0000 ug | PREFILLED_SYRINGE | INTRAMUSCULAR | Status: DC | PRN

## 2023-12-08 MED ORDER — FENTANYL CITRATE PF 50 MCG/ML IJ SOSY
75.0000 ug | PREFILLED_SYRINGE | Freq: Once | INTRAMUSCULAR | Status: AC
  Administered 2023-12-08: 75 ug via INTRAVENOUS
  Filled 2023-12-08: qty 2

## 2023-12-08 MED ORDER — FENTANYL CITRATE (PF) 100 MCG/2ML IJ SOLN
25.0000 ug | INTRAMUSCULAR | Status: DC | PRN
  Administered 2023-12-08 (×2): 50 ug via INTRAVENOUS

## 2023-12-08 MED ORDER — SUGAMMADEX SODIUM 200 MG/2ML IV SOLN
INTRAVENOUS | Status: DC | PRN
  Administered 2023-12-08: 240 mg via INTRAVENOUS

## 2023-12-08 MED ORDER — SODIUM CHLORIDE 0.9 % IV SOLN
INTRAVENOUS | Status: DC | PRN
  Administered 2023-12-08: 2 g via INTRAVENOUS

## 2023-12-08 MED ORDER — ACETAMINOPHEN 10 MG/ML IV SOLN: 1000.0000 mg | Freq: Once | INTRAVENOUS | Status: DC | PRN

## 2023-12-08 MED ORDER — DEXAMETHASONE SODIUM PHOSPHATE 10 MG/ML IJ SOLN
INTRAMUSCULAR | Status: AC
  Filled 2023-12-08: qty 1

## 2023-12-08 MED ORDER — BUPIVACAINE-EPINEPHRINE 0.25% -1:200000 IJ SOLN
INTRAMUSCULAR | Status: DC | PRN
  Administered 2023-12-08: 10 mL
  Administered 2023-12-08: 20 mL

## 2023-12-08 MED ORDER — 0.9 % SODIUM CHLORIDE (POUR BTL) OPTIME
TOPICAL | Status: DC | PRN
  Administered 2023-12-08: 500 mL

## 2023-12-08 MED ORDER — DROPERIDOL 2.5 MG/ML IJ SOLN
INTRAMUSCULAR | Status: AC
  Filled 2023-12-08: qty 2

## 2023-12-08 MED ORDER — PROPOFOL 10 MG/ML IV BOLUS
INTRAVENOUS | Status: DC | PRN
  Administered 2023-12-08: 150 mg via INTRAVENOUS
  Administered 2023-12-08: 25 ug/kg/min via INTRAVENOUS

## 2023-12-08 MED ORDER — MIDAZOLAM HCL 2 MG/2ML IJ SOLN
INTRAMUSCULAR | Status: DC | PRN
  Administered 2023-12-08: 2 mg via INTRAVENOUS

## 2023-12-08 MED ORDER — OXYCODONE-ACETAMINOPHEN 5-325 MG PO TABS: 1.0000 | ORAL_TABLET | ORAL | 0 refills | Status: DC | PRN

## 2023-12-08 MED ORDER — ACETAMINOPHEN 10 MG/ML IV SOLN
INTRAVENOUS | Status: AC
  Filled 2023-12-08: qty 100

## 2023-12-08 MED ORDER — ONDANSETRON HCL 4 MG/2ML IJ SOLN
INTRAMUSCULAR | Status: DC | PRN
  Administered 2023-12-08: 4 mg via INTRAVENOUS

## 2023-12-08 SURGICAL SUPPLY — 52 items
BINDER ABDOMINAL 12 ML 46-62 (SOFTGOODS) IMPLANT
BINDER ABDOMINAL 9 SM 30-45 (SOFTGOODS) IMPLANT
CHLORAPREP W/TINT 26 (MISCELLANEOUS) ×2 IMPLANT
COVER TIP SHEARS 8 DVNC (MISCELLANEOUS) ×2 IMPLANT
COVER WAND RF STERILE (DRAPES) ×2 IMPLANT
DERMABOND ADVANCED .7 DNX12 (GAUZE/BANDAGES/DRESSINGS) ×2 IMPLANT
DRAPE ARM DVNC X/XI (DISPOSABLE) ×6 IMPLANT
DRAPE COLUMN DVNC XI (DISPOSABLE) ×2 IMPLANT
ELECT REM PT RETURN 9FT ADLT (ELECTROSURGICAL) ×2
ELECTRODE REM PT RTRN 9FT ADLT (ELECTROSURGICAL) ×2 IMPLANT
FORCEPS BPLR R/ABLATION 8 DVNC (INSTRUMENTS) ×2 IMPLANT
GLOVE ORTHO TXT STRL SZ7.5 (GLOVE) ×4 IMPLANT
GOWN STRL REUS W/ TWL LRG LVL3 (GOWN DISPOSABLE) ×2 IMPLANT
GOWN STRL REUS W/ TWL XL LVL3 (GOWN DISPOSABLE) ×4 IMPLANT
GRASPER SUT TROCAR 14GX15 (MISCELLANEOUS) IMPLANT
GRASPER TIP-UP FEN DVNC XI (INSTRUMENTS) IMPLANT
IRRIGATION STRYKERFLOW (MISCELLANEOUS) IMPLANT
IRRIGATOR STRYKERFLOW (MISCELLANEOUS)
IV NS IRRIG 3000ML ARTHROMATIC (IV SOLUTION) IMPLANT
KIT PINK PAD W/HEAD ARE REST (MISCELLANEOUS) ×2 IMPLANT
KIT PINK PAD W/HEAD ARM REST (MISCELLANEOUS) ×2 IMPLANT
KIT TURNOVER KIT A (KITS) ×2 IMPLANT
MANIFOLD NEPTUNE II (INSTRUMENTS) ×2 IMPLANT
MESH VENTRALIGHT ST 4.5IN (Mesh General) IMPLANT
NDL DRIVE SUT CUT DVNC (INSTRUMENTS) ×2 IMPLANT
NDL HYPO 22X1.5 SAFETY MO (MISCELLANEOUS) ×2 IMPLANT
NDL INSUFFLATION 14GA 120MM (NEEDLE) ×2 IMPLANT
NEEDLE DRIVE SUT CUT DVNC (INSTRUMENTS) ×2 IMPLANT
NEEDLE HYPO 22X1.5 SAFETY MO (MISCELLANEOUS) ×2 IMPLANT
NEEDLE INSUFFLATION 14GA 120MM (NEEDLE) ×2 IMPLANT
NS IRRIG 500ML POUR BTL (IV SOLUTION) ×2 IMPLANT
PACK LAP CHOLECYSTECTOMY (MISCELLANEOUS) ×2 IMPLANT
SCISSORS METZENBAUM CVD 33 (INSTRUMENTS) IMPLANT
SCISSORS MNPLR CVD DVNC XI (INSTRUMENTS) ×2 IMPLANT
SEAL UNIV 5-12 XI (MISCELLANEOUS) ×6 IMPLANT
SET TUBE SMOKE EVAC HIGH FLOW (TUBING) ×2 IMPLANT
SLEEVE SCD COMPRESS KNEE MED (STOCKING) IMPLANT
SOL ELECTROSURG ANTI STICK (MISCELLANEOUS) ×2
SOLUTION ELECTROSURG ANTI STCK (MISCELLANEOUS) ×2 IMPLANT
SPIKE FLUID TRANSFER (MISCELLANEOUS) ×2 IMPLANT
SUT MNCRL 4-0 27XMFL (SUTURE) ×2
SUT STRATA 2-0 23CM CT-2 (SUTURE) ×4 IMPLANT
SUT STRATAFIX 0 PDS+ CT-2 23 (SUTURE) ×2
SUT STRATAFIX SPIRAL PDS+ 0 30 (SUTURE) ×2 IMPLANT
SUT VIC AB 3-0 SH 27X BRD (SUTURE) IMPLANT
SUT VICRYL 0 UR6 27IN ABS (SUTURE) ×2 IMPLANT
SUT VLOC 180 0 9IN GS21 (SUTURE) IMPLANT
SUTURE MNCRL 4-0 27XMF (SUTURE) ×2 IMPLANT
SUTURE STRATFX 0 PDS+ CT-2 23 (SUTURE) IMPLANT
TRAP FLUID SMOKE EVACUATOR (MISCELLANEOUS) ×2 IMPLANT
TROCAR Z-THREAD FIOS 11X100 BL (TROCAR) ×2 IMPLANT
WATER STERILE IRR 500ML POUR (IV SOLUTION) ×2 IMPLANT

## 2023-12-08 NOTE — Transfer of Care (Signed)
Immediate Anesthesia Transfer of Care Note  Patient: Patricia Rojas  Procedure(s) Performed: XI ROBOTIC ASSISTED VENTRAL HERNIA (Abdomen) INSERTION OF MESH  Patient Location: PACU  Anesthesia Type:General  Level of Consciousness: drowsy and patient cooperative  Airway & Oxygen Therapy: Patient Spontanous Breathing and Patient connected to face mask oxygen  Post-op Assessment: Report given to RN and Post -op Vital signs reviewed and stable  Post vital signs: Reviewed and stable  Last Vitals:  Vitals Value Taken Time  BP 139/88 12/08/23 1414  Temp 36.4 C 12/08/23 1411  Pulse 100 12/08/23 1414  Resp 13 12/08/23 1414  SpO2 100 % 12/08/23 1414  Vitals shown include unfiled device data.  Last Pain:  Vitals:   12/08/23 1411  TempSrc:   PainSc: Asleep         Complications: No notable events documented.

## 2023-12-08 NOTE — ED Notes (Signed)
EKG will be performed. Pt pale, vomiting, diaphoretic.

## 2023-12-08 NOTE — ED Notes (Signed)
EDMD at bedside

## 2023-12-08 NOTE — ED Notes (Addendum)
Pt is vomiting. Pt also noted to be diaphoretic. Pt endorses abdominal pain. States nephrostomy tube is not blocked.

## 2023-12-08 NOTE — ED Triage Notes (Signed)
Pt presents to ER with c/o mid-left abd pain that woke pt from her sleep this morning.  Pt endorses some vomiting, but denies any diarrhea.  Pt states last BM was this morning and was normal for her.  Pt denies hx of abd problems previously, but states she does have a nephrostomy tube on her right side.   Pt is otherwise A&O x4 and in NAD at this time.

## 2023-12-08 NOTE — Op Note (Signed)
Robotic assisted laparoscopic anterior abdominal hernia repair, IPOM, initial, incarcerated.  final measured fascial defect 4 cm.    Pre-operative Diagnosis: Anterior abdominal (incisional) hernia, incarcerated with SBO   Post-operative Diagnosis: same.    Surgeon:  Campbell Lerner, M.D., FACS   Anesthesia: Gen. with endotracheal tube   Findings:  4 CM fascial defect.  SB reduced after anesthesia induction, visualized well and noted to be completely viable without defect, no evidence of ischemia  Estimated Blood Loss: 25 mL   Complications: none           Procedure Details  The patient was seen again in the Holding Room. The benefits, complications, treatment options, and expected outcomes were discussed with the patient. The risks of bleeding, infection, recurrence of symptoms, failure to resolve symptoms, bowel injury, mesh placement, mesh infection, any of which could require further surgery were reviewed with the patient. The likelihood of improving the patient's symptoms with return to their baseline status is good.  The patient and/or family concurred with the proposed plan, giving informed consent.  The patient was taken to Operating Room, identified and the procedure verified.  A Time Out was held and the above information confirmed.   Prior to the induction of general anesthesia, antibiotic prophylaxis was administered. VTE prophylaxis was in place. General endotracheal anesthesia was then administered and tolerated well. After the induction, the abdomen was prepped with Chloraprep and draped in the sterile fashion. The patient was positioned in the supine position.  After local infiltration of quarter percent Marcaine with epinephrine, stab incision was made left upper quadrant.  Just below the costal margin approximately midclavicular line the Veress needle is passed with sensation of the layers to penetrate the abdominal wall and into the peritoneum.  Saline drop test is confirmed  peritoneal placement.  Insufflation is initiated with carbon dioxide to pressures of 15 mmHg. Marcaine quarter percent  was used to inject all the incision sites. We used a left upper quadrant subcostal incision and using an [employer] FIOS 11 mm trocar, it was inserted with direct visualization and pneumoperitoneum maintained.  No hemodynamic compromise, no evidence of intraperitoneal injury.   2 additional 8 mm ports were placed under direct visualization on the left lateral abdomen.  I visualized the hernia and there was no contents within a midline para-umbilical hernia, and significant omental adhesion to the RUQ prior hernia repair.  The robot was brought to the surgical field and docked in the standard fashion.  We made sure that all instrumentation was kept under direct vision at all times and there was no collision between the arms.  I scrubbed out and went to the console. Falciform and adjacent preperitoneal adipose and umbilical ligaments and hernia sac were taken down with electrocautery to allow adequate mesh placement to be secured to the fascial tissue. Confirm and measured that the defect was 4 cm.  .  Using a 0 Stratafix suture we closed the ventral defect primarily reapproximating the lateral perimeter to the midline.  At this time I went ahead and inserted the Stratafix ST 11 cm diameter circular mesh. I used the same suture to secure the mesh centering it to the fascial closure.    The mesh was secured circumferentially to the abdominal wall using 2-0 V-lock in the standard fashion. Incorporating the perimeter flap. The mesh appeared well secured against the  abdominal wall. A second look laparoscopy revealed no evidence of intra-abdominal injury.  A 3-0 Vicryl partially closed the peritoneal flap over  the mesh covering the majority of the perimeter.  All the needles were removed under direct visualization.  The instruments were removed and the robot was undocked.    The  laparoscopic ports were removed under direct visualization and the pneumoperitoneum was deflated. Incisions were closed with  4-0 Monocryl   Dermabond was used to coat the skin.  Patient tolerated procedure well and there were no immediate complications. Needle and laparotomy counts were correct   Abdominal binder applied.  Campbell Lerner M.D., [facility] 12/08/2023 2:14 PM

## 2023-12-08 NOTE — ED Notes (Signed)
Pt asking how long wait will be. Informed pt unable to give wait times. Provided wheelchair for pt to sit.

## 2023-12-08 NOTE — Anesthesia Procedure Notes (Signed)
Procedure Name: Intubation Date/Time: 12/08/2023 11:51 AM  Performed by: Omer Jack, CRNAPre-anesthesia Checklist: Patient identified, Patient being monitored, Timeout performed, Emergency Drugs available and Suction available Patient Re-evaluated:Patient Re-evaluated prior to induction Oxygen Delivery Method: Circle system utilized Preoxygenation: Pre-oxygenation with 100% oxygen Induction Type: IV induction, Rapid sequence and Cricoid Pressure applied Ventilation: Mask ventilation without difficulty Laryngoscope Size: 3 and McGrath Grade View: Grade I Tube type: Oral Tube size: 6.5 mm Number of attempts: 1 Airway Equipment and Method: Stylet Placement Confirmation: ETT inserted through vocal cords under direct vision, positive ETCO2 and breath sounds checked- equal and bilateral Secured at: 21 cm Tube secured with: Tape Dental Injury: Teeth and Oropharynx as per pre-operative assessment

## 2023-12-08 NOTE — ED Provider Notes (Signed)
[facility] Provider Note    Event Date/Time   First MD Initiated Contact with Patient 12/08/23 [phone]     (approximate)   History   Abdominal Pain   HPI  Patricia Rojas is a 57 y.o. female past medical history significant for hypertension, hyperlipidemia cervical cancer, nephrostomy tube on the right side, who presents to the emergency department with severe abdominal pain.  Patient states that she started having severe abdominal pain that woke her up from sleep.  Felt a bulge in her abdomen and has had multiple episodes of nausea and vomiting since that time.  Known hernia but states that she has been able to reduce it in the past.  Feels a large bulge and severe pain around her umbilicus.     Physical Exam   Triage Vital Signs: ED Triage Vitals  Encounter Vitals Group     BP 12/08/23 0643 (!) 150/103     Systolic BP Percentile --      Diastolic BP Percentile --      Pulse Rate 12/08/23 0643 74     Resp 12/08/23 0643 18     Temp 12/08/23 0643 97.8 F (36.6 C)     Temp Source 12/08/23 0643 Oral     SpO2 12/08/23 0643 97 %     Weight 12/08/23 0642 262 lb (118.8 kg)     Height 12/08/23 0642 5\' 5"  (1.651 m)     Head Circumference --      Peak Flow --      Pain Score 12/08/23 0642 10     Pain Loc --      Pain Education --      Exclude from Growth Chart --     Most recent vital signs: Vitals:   12/08/23 0930 12/08/23 1000  BP: (!) 141/71 131/76  Pulse: 81 62  Resp: 18 15  Temp:    SpO2: 97% 93%    Physical Exam Constitutional:      General: She is in acute distress.     Appearance: She is well-developed.  HENT:     Head: Atraumatic.  Eyes:     Conjunctiva/sclera: Conjunctivae normal.  Cardiovascular:     Rate and Rhythm: Regular rhythm.  Pulmonary:     Effort: No respiratory distress.  Abdominal:     General: There is no distension.     Tenderness: There is abdominal tenderness in the periumbilical area.     Comments: Obvious  periumbilical hernia, unable to reduce  Musculoskeletal:        General: Normal range of motion.     Cervical back: Normal range of motion.  Skin:    General: Skin is warm.  Neurological:     Mental Status: She is alert. Mental status is at baseline.     IMPRESSION / MDM / ASSESSMENT AND PLAN / ED COURSE  I reviewed the triage vital signs and the nursing notes.  Differential diagnosis concerning for incarcerated hernia/strangulated hernia, bowel obstruction  EKG  I, Corena Herter, the attending physician, personally viewed and interpreted this ECG.   Rate: Normal  Rhythm: Normal sinus  Axis: Normal  Intervals: Normal  ST&T Change: None  No tachycardic or bradycardic dysrhythmias while on cardiac telemetry.  RADIOLOGY I independently reviewed imaging, my interpretation of imaging: CT scan concerning for incisional hernia in the umbilicus with bowel.  Signs of bowel obstruction.  LABS (all labs ordered are listed, but only abnormal results are displayed) Labs interpreted as -  Labs Reviewed  CBC - Abnormal; Notable for the following components:      Result Value   RBC 5.39 (*)    HCT 46.3 (*)    All other components within normal limits  COMPREHENSIVE METABOLIC PANEL - Abnormal; Notable for the following components:   Glucose, Bld 107 (*)    BUN 27 (*)    Creatinine, Ser 1.23 (*)    Total Protein 8.2 (*)    GFR, Estimated 51 (*)    All other components within normal limits  URINALYSIS, ROUTINE W REFLEX MICROSCOPIC - Abnormal; Notable for the following components:   Color, Urine YELLOW (*)    APPearance HAZY (*)    Protein, ur 30 (*)    All other components within normal limits  LIPASE, BLOOD  LACTIC ACID, PLASMA  LACTIC ACID, PLASMA     MDM   Clinical Course as of 12/08/23 1024  Sat Dec 08, 2023  2536 Concern for incarcerated hernia.  Consulted general surgery after failed attempt to reduce the hernia, discussed with Dr. Claudine Mouton who asked for CT scan  and will evaluate the patient in the emergency department with likely need to go to the OR. [SM]    Clinical Course User Index [SM] Corena Herter, MD   Given multiple doses of IV pain medication.  Attempted to reduce the hernia but was unsuccessful.  Given antiemetics.  After discussion with Dr. Claudine Mouton plan to go to the OR for incarcerated hernia.  PROCEDURES:  Critical Care performed: yes  .Critical Care  Performed by: Corena Herter, MD Authorized by: Corena Herter, MD   Critical care provider statement:    Critical care time (minutes):  30   Critical care time was exclusive of:  Separately billable procedures and treating other patients   Critical care was necessary to treat or prevent imminent or life-threatening deterioration of the following conditions: Emergent OR.   Critical care was time spent personally by me on the following activities:  Development of treatment plan with patient or surrogate, discussions with consultants, evaluation of patient's response to treatment, examination of patient, ordering and review of laboratory studies, ordering and review of radiographic studies, ordering and performing treatments and interventions, pulse oximetry, re-evaluation of patient's condition and review of old charts   Care discussed with: admitting provider     Patient's presentation is most consistent with acute presentation with potential threat to life or bodily function.   MEDICATIONS ORDERED IN ED: Medications  fentaNYL (SUBLIMAZE) injection 50 mcg (has no administration in time range)  ondansetron (ZOFRAN) injection 4 mg (4 mg Intravenous Given 12/08/23 0827)  ondansetron (ZOFRAN) injection 4 mg (4 mg Intravenous Given 12/08/23 0854)  fentaNYL (SUBLIMAZE) injection 75 mcg (75 mcg Intravenous Given 12/08/23 0854)  HYDROmorphone (DILAUDID) injection 1 mg (1 mg Intravenous Given 12/08/23 0904)  promethazine (PHENERGAN) 12.5 mg in sodium chloride 0.9 % 50 mL IVPB (0 mg  Intravenous Stopped 12/08/23 1012)  iohexol (OMNIPAQUE) 300 MG/ML solution 100 mL (100 mLs Intravenous Contrast Given 12/08/23 0924)  HYDROmorphone (DILAUDID) injection 1 mg (1 mg Intravenous Given 12/08/23 1012)    FINAL CLINICAL IMPRESSION(S) / ED DIAGNOSES   Final diagnoses:  Incarcerated hernia  SBO (small bowel obstruction) (HCC)     Rx / DC Orders   ED Discharge Orders     None        Note:  This document was prepared using Dragon voice recognition software and may include unintentional dictation errors.   Corena Herter, MD 12/08/23  1024  

## 2023-12-08 NOTE — H&P (Signed)
Patient ID: Patricia Rojas, female   DOB: [birth year], 57 y.o.   MRN: [id number]  Chief Complaint: Abdominal pain, associated nausea vomiting.  History of Present Illness Patricia PADELFORD is a 57 y.o. female with a known hernia with sporadic pain exacerbated by coughing and sneezing.  Was in her usual state of health through last evening, awoke this morning with greater abdominal pain, followed by nausea and vomiting.  She felt the pain came first and likely was vomiting secondary to it.  The pain was quite severe.  Never to this degree in the past.  She had a normal bowel movement this morning.  She denies melena hematochezia or coffee-ground emesis.  She denies frank bilious emesis, did have some yellow golden discoloration of emesis.  She did not eat this morning.  Prior surgeries include an open cholecystectomy, and an open mesh repair of a right upper quadrant incisional hernia.  She felt some chills, but denies fever.  Past Medical History Past Medical History:  Diagnosis Date   Anxiety    Cervical cancer (HCC) 2018   internal rad tx's and chemo tx's.    Constipation    Depression    GERD (gastroesophageal reflux disease)    Hemorrhoids    Hypertension    N&V (nausea and vomiting)    PONV (postoperative nausea and vomiting)    PT RECEIVED SCOPLAMINE PATCH FOR 01-09-18 SURGERY AND SHE HAD NO N/V   Renal disorder       Past Surgical History:  Procedure Laterality Date   CESAREAN SECTION  1986   CHOLECYSTECTOMY     COLONOSCOPY WITH PROPOFOL N/A 01/12/2023   Procedure: COLONOSCOPY WITH PROPOFOL;  Surgeon: Wyline Mood, MD;  Location: [facility] ENDOSCOPY;  Service: Gastroenterology;  Laterality: N/A;   CYSTOSCOPY W/ RETROGRADES Right 03/25/2018   Procedure: CYSTOSCOPY WITH RETROGRADE PYELOGRAM;  Surgeon: Vanna Scotland, MD;  Location: [facility] ORS;  Service: Urology;  Laterality: Right;   CYSTOSCOPY W/ URETERAL STENT PLACEMENT Right 03/25/2018   Procedure: CYSTOSCOPY WITH STENT REPLACEMENT;  Surgeon:  Vanna Scotland, MD;  Location: [facility] ORS;  Service: Urology;  Laterality: Right;   HERNIA REPAIR     IR GENERIC HISTORICAL  02/23/2017   IR NEPHROSTOMY PLACEMENT RIGHT 02/23/2017 Oley Balm, MD [facility]-INTERV RAD   IR NEPHROSTOGRAM RIGHT THRU EXISTING ACCESS  02/15/2018   IR NEPHROSTOMY EXCHANGE RIGHT  04/18/2017   IR NEPHROSTOMY EXCHANGE RIGHT  06/08/2017   IR NEPHROSTOMY EXCHANGE RIGHT  08/17/2017   IR NEPHROSTOMY EXCHANGE RIGHT  10/05/2017   IR NEPHROSTOMY EXCHANGE RIGHT  12/28/2017   IR NEPHROSTOMY PLACEMENT RIGHT  06/05/2018   IR URETERAL STENT PERC REMOVAL MOD SED  06/05/2018   IR URETERAL STENT PLACEMENT EXISTING ACCESS RIGHT  02/15/2018   VULVA Ples Specter BIOPSY N/A 01/09/2018   Procedure: VAGINAL  AND CERVIX BIOPSY;  Surgeon: Leida Lauth, MD;  Location: [facility] ORS;  Service: Gynecology;  Laterality: N/A;    No Known Allergies  Current Facility-Administered Medications  Medication Dose Route Frequency Provider Last Rate Last Admin   fentaNYL (SUBLIMAZE) injection 50 mcg  50 mcg Intravenous Q30 min PRN Mumma, Carollee Herter, MD       scopolamine (TRANSDERM-SCOP) 1 MG/3DAYS 1.5 mg  1 patch Transdermal Once Foye Deer, MD       Current Outpatient Medications  Medication Sig Dispense Refill   albuterol (VENTOLIN HFA) 108 (90 Base) MCG/ACT inhaler Inhale 2 puffs into the lungs every 6 (six) hours as needed for wheezing or shortness of breath. 8 g  2   amLODipine (NORVASC) 2.5 MG tablet Take 1 tablet (2.5 mg total) by mouth daily. 30 tablet 3   promethazine (PHENERGAN) 25 MG tablet TAKE 1 TABLET (25 MG TOTAL) BY MOUTH EVERY 6 (SIX) HOURS AS NEEDED FOR NAUSEA OR VOMITING. 30 tablet 2   rosuvastatin (CRESTOR) 5 MG tablet Take 1 tablet (5 mg total) by mouth daily. 90 tablet 3    Family History Family History  Problem Relation Age of Onset   Cancer Maternal Uncle    Cancer Paternal Aunt    Kidney cancer Neg Hx    Bladder Cancer Neg Hx       Social History Social History   Tobacco  Use   Smoking status: Former    Current packs/day: 0.00    Types: Cigarettes    Quit date: 1990    Years since quitting: 35.0   Smokeless tobacco: Never  Vaping Use   Vaping status: Never Used  Substance Use Topics   Alcohol use: No   Drug use: Yes    Types: Marijuana    Comment: every night updated on 11/16/2022        Review of Systems  Constitutional:  Positive for chills. Negative for fever and weight loss.  HENT: Negative.    Eyes: Negative.   Respiratory: Negative.    Cardiovascular: Negative.   Gastrointestinal:  Positive for abdominal pain, nausea and vomiting. Negative for blood in stool, constipation, diarrhea and melena.  Genitourinary: Negative.   Skin: Negative.   Neurological: Negative.   Psychiatric/Behavioral: Negative.    All other systems reviewed and are negative.    Physical Exam Blood pressure 122/67, pulse (!) 58, temperature 97.9 F (36.6 C), temperature source Oral, resp. rate 14, height 5\' 5"  (1.651 m), weight 118.8 kg, last menstrual period 12/18/2016, SpO2 97%. Last Weight  Most recent update: 12/08/2023  6:42 AM    Weight  118.8 kg (262 lb)             CONSTITUTIONAL: Well developed, and nourished, appropriately responsive and aware without distress.   EYES: Sclera non-icteric.   EARS, NOSE, MOUTH AND THROAT:  The oropharynx is clear. Oral mucosa is pink and moist.  Hearing is intact to voice.  NECK: Trachea is midline, and there is no jugular venous distension.  LYMPH NODES:  Lymph nodes in the neck are not appreciated. RESPIRATORY:  Lungs are clear, and breath sounds are equal bilaterally. Normal respiratory effort without pathologic use of accessory muscles. CARDIOVASCULAR: Heart is regular in rate and rhythm.  Well perfused.  GI: The abdomen is notable for a tender supraumbilical mass, not reducible, otherwise soft, nontender, and nondistended.  I did not appreciate hepatosplenomegaly.  MUSCULOSKELETAL:  Symmetrical muscle tone  appreciated in all four extremities.    SKIN: Skin turgor is normal. No pathologic skin lesions appreciated.  NEUROLOGIC:  Motor and sensation appear grossly normal.  Cranial nerves are grossly without defect. PSYCH:  Alert and oriented to person, place and time. Affect is appropriate for situation.  Data Reviewed I have personally reviewed what is currently available of the patient's imaging, recent labs and medical records.   Labs:     Latest Ref Rng & Units 12/08/2023    6:45 AM 10/19/2022    9:16 AM 03/23/2022    2:44 PM  CBC  WBC 4.0 - 10.5 K/uL 5.1  5.3  9.2   Hemoglobin 12.0 - 15.0 g/dL 21.3  08.6  57.8   Hematocrit 36.0 - 46.0 % 46.3  44.0  42.9   Platelets 150 - 400 K/uL 212  264  257       Latest Ref Rng & Units 12/08/2023    6:45 AM 10/19/2022    9:16 AM 03/23/2022    2:44 PM  CMP  Glucose 70 - 99 mg/dL 098  86  94   BUN 6 - 20 mg/dL 27  27  21    Creatinine 0.44 - 1.00 mg/dL 1.19  1.47  8.29   Sodium 135 - 145 mmol/L 142  140  137   Potassium 3.5 - 5.1 mmol/L 4.2  4.7  4.3   Chloride 98 - 111 mmol/L 109  106  109   CO2 22 - 32 mmol/L 23  23  23    Calcium 8.9 - 10.3 mg/dL 9.3  9.8  8.8   Total Protein 6.5 - 8.1 g/dL 8.2  7.6  7.8   Total Bilirubin <1.2 mg/dL 0.9  0.4  0.5   Alkaline Phos 38 - 126 U/L 90   99   AST 15 - 41 U/L 24  25  23    ALT 0 - 44 U/L 20  20  16     Venous lactate 1.2.   Imaging: Radiological images reviewed:   Within last 24 hrs: CT ABDOMEN PELVIS W CONTRAST Result Date: 12/08/2023 CLINICAL DATA:  57 year old female with history of mid left abdominal pain. Suspected hernia. EXAM: CT ABDOMEN AND PELVIS WITH CONTRAST TECHNIQUE: Multidetector CT imaging of the abdomen and pelvis was performed using the standard protocol following bolus administration of intravenous contrast. RADIATION DOSE REDUCTION: This exam was performed according to the departmental dose-optimization program which includes automated exposure control, adjustment of the mA and/or  kV according to patient size and/or use of iterative reconstruction technique. CONTRAST:  OMNIPAQUE IOHEXOL 300 MG/ML  SOLN COMPARISON:  CT of the abdomen and pelvis 03/24/2022. FINDINGS: Lower chest: Unremarkable. Hepatobiliary: No suspicious cystic or solid hepatic lesions. No intra or extrahepatic biliary ductal dilatation. Status post cholecystectomy. Pancreas: No pancreatic mass. No pancreatic ductal dilatation. No pancreatic or peripancreatic fluid collections or inflammatory changes. Spleen: Unremarkable. Adrenals/Urinary Tract: Percutaneous nephrostomy tube with tip reformed in the right renal pelvis. 2 mm nonobstructive calculus in the interpolar collecting system of the right kidney. Mild atrophy of the right kidney. No hydroureteronephrosis. Left kidney and bilateral adrenal glands are normal in appearance. Urinary bladder is unremarkable in appearance. Stomach/Bowel: The appearance of the stomach is normal. The patient's periumbilical ventral hernia has enlarged compared to the prior study, and now contains a short segment of mid small bowel. Adjacent to the hernia there are some mildly dilated loops of small bowel measuring up to 3.3 cm in diameter with air-fluid levels. Small bowel distal to this appears relatively decompressed. No pathologic dilatation of the colon. Numerous colonic diverticuli are noted, without surrounding inflammatory changes to suggest an acute diverticulitis at this time. Some gas and stool is noted throughout the colon and distal rectum. Normal appendix. Vascular/Lymphatic: Atherosclerosis in the abdominal aorta and pelvic vasculature. No definite lymphadenopathy noted in the abdomen or pelvis. Reproductive: Uterus and ovaries are unremarkable in appearance. Other: Postoperative changes of ventral hernia repair are noted in the epigastric region. No significant volume of ascites. No pneumoperitoneum. Musculoskeletal: There are no aggressive appearing lytic or blastic  lesions noted in the visualized portions of the skeleton. IMPRESSION: 1. Enlargement of paraumbilical ventral hernia which contains a short segment of mid small bowel, beyond which the distal small bowel is nearly  completely decompressed, and preceding there are some mildly dilated loops of small bowel with air-fluid levels. Findings are concerning for probable early or partial small bowel obstruction. Surgical consultation is recommended. 2. Colonic diverticulosis without evidence of acute diverticulitis at this time. 3. Percutaneous nephrostomy tube stable in position in the right renal collecting system. No right-sided hydroureteronephrosis. 4. 2 mm nonobstructive calculus in the interpolar collecting system of the right kidney. 5. Aortic atherosclerosis. 6. Additional incidental findings, as above. Electronically Signed   By: Trudie Reed M.D.   On: 12/08/2023 10:05    Assessment    Incarcerated incisional hernia/anterior abdominal wall hernia with small bowel involvement and clinical and imaging evidence of small bowel obstruction. Patient Active Problem List   Diagnosis Date Noted   Adenomatous polyp of colon 01/12/2023   Renal insufficiency 11/20/2022   Dyslipidemia 11/20/2022   Other artificial openings of urinary tract status (HCC) 10/19/2022   Primary hypertension 10/19/2022   Hydronephrosis, right 05/16/2018   Ureteral stricture 05/15/2018   Acute kidney injury (HCC) 09/10/2017   Intractable pain    UTI (urinary tract infection) 08/12/2017   Pyelonephritis 06/27/2017   Sepsis (HCC) 06/10/2017   Intractable nausea and vomiting 04/01/2017   Morbid obesity (HCC) 03/07/2017   Malignant neoplasm of overlapping sites of cervix (HCC) 03/06/2017   Screening for colon cancer 02/18/2017   Malignant neoplasm of cervix (HCC) 02/07/2017    Plan      XI ROBOTIC ASSISTED VENTRAL HERNIA: Repair of incarcerated anterior abdominal wall hernia, possible fascial defect of 3+ centimeters. I  discussed possibility of incarceration, strangulation, enlargement in size over time, and the need for emergency surgery in the face of these.  Also reviewed the techniques of reduction should incarceration occur, and when unsuccessful to present to the ED.  Also discussed that surgery risks include recurrence which can be up to 30% in the case of complex hernias, use of prosthetic materials (mesh) and the increased risk of infection and the possible need for re-operation and removal of mesh, possibility of post-op SBO or ileus, and the risks of general anesthetic including heart attack, stroke, sudden death or some reaction to anesthetic medications. The patient, and those present, appear to understand the risks, any and all questions were answered to the patient's satisfaction.  No guarantees were ever expressed or implied.  Face-to-face time spent with the patient and accompanying care providers(if present) was 30 minutes, with more than 50% of the time spent counseling, educating, and coordinating care of the patient.    These notes generated with voice recognition software. I apologize for typographical errors.  Campbell Lerner M.D., FACS 12/08/2023, 11:25 AM

## 2023-12-08 NOTE — ED Notes (Signed)
Transported to CT 

## 2023-12-08 NOTE — Anesthesia Preprocedure Evaluation (Addendum)
Anesthesia Evaluation  Patient identified by MRN, date of birth, ID band Patient awake    Reviewed: Allergy & Precautions, H&P , NPO status , Patient's Chart, lab work & pertinent test results  History of Anesthesia Complications (+) PONV and history of anesthetic complications  Airway Mallampati: IV  TM Distance: >3 FB Neck ROM: full    Dental  (+) Missing, Poor Dentition   Pulmonary former smoker   Pulmonary exam normal        Cardiovascular hypertension, Normal cardiovascular exam     Neuro/Psych  PSYCHIATRIC DISORDERS Anxiety     negative neurological ROS     GI/Hepatic Neg liver ROS,GERD  ,,  Endo/Other    Class 3 obesity  Renal/GU Renal diseaseR sided nephrostomy tube     Musculoskeletal   Abdominal  (+) + obese Abdomen: tender.   Peds  Hematology negative hematology ROS (+)   Anesthesia Other Findings incarcerated incisional hernia with vomiting this am before CT scan. CT scan showed stomach with minimal fluid. Bedside gastric ultrasound in pre-op showed minimal fluid in the antrum. Pt denies nausea currently. Will proceed with RSI.   Past Medical History: No date: Anxiety 2018: Cervical cancer (HCC)     Comment:  internal rad tx's and chemo tx's.  No date: Constipation No date: Depression No date: GERD (gastroesophageal reflux disease) No date: Hemorrhoids No date: Hypertension No date: N&V (nausea and vomiting) No date: PONV (postoperative nausea and vomiting)     Comment:  PT RECEIVED SCOPLAMINE PATCH FOR 01-09-18 SURGERY AND SHE              HAD NO N/V No date: Renal disorder  Past Surgical History: 1986: CESAREAN SECTION No date: CHOLECYSTECTOMY 01/12/2023: COLONOSCOPY WITH PROPOFOL; N/A     Comment:  Procedure: COLONOSCOPY WITH PROPOFOL;  Surgeon: Wyline Mood, MD;  Location: [facility] ENDOSCOPY;  Service:               Gastroenterology;  Laterality: N/A; 03/25/2018: CYSTOSCOPY W/  RETROGRADES; Right     Comment:  Procedure: CYSTOSCOPY WITH RETROGRADE PYELOGRAM;                Surgeon: Vanna Scotland, MD;  Location: [facility] ORS;                Service: Urology;  Laterality: Right; 03/25/2018: CYSTOSCOPY W/ URETERAL STENT PLACEMENT; Right     Comment:  Procedure: CYSTOSCOPY WITH STENT REPLACEMENT;  Surgeon:               Vanna Scotland, MD;  Location: [facility] ORS;  Service:               Urology;  Laterality: Right; No date: HERNIA REPAIR 02/23/2017: IR GENERIC HISTORICAL     Comment:  IR NEPHROSTOMY PLACEMENT RIGHT 02/23/2017 Oley Balm,              MD [facility]INTER[facility] RAD 02/15/2018: IR NEPHROSTOGRAM RIGHT THRU EXISTING ACCESS 04/18/2017: IR NEPHROSTOMY EXCHANGE RIGHT 06/08/2017: IR NEPHROSTOMY EXCHANGE RIGHT 08/17/2017: IR NEPHROSTOMY EXCHANGE RIGHT 10/05/2017: IR NEPHROSTOMY EXCHANGE RIGHT 12/28/2017: IR NEPHROSTOMY EXCHANGE RIGHT 06/05/2018: IR NEPHROSTOMY PLACEMENT RIGHT 06/05/2018: IR URETERAL STENT PERC REMOVAL MOD SED 02/15/2018: IR URETERAL STENT PLACEMENT EXISTING ACCESS RIGHT 01/09/2018: VULVA Ples Specter BIOPSY; N/A     Comment:  Procedure: VAGINAL  AND CERVIX BIOPSY;  Surgeon:               Leida Lauth, MD;  Location: [facility] ORS;  Service:               Gynecology;  Laterality: N/A;  BMI    Body Mass Index: 43.60 kg/m      Reproductive/Obstetrics negative OB ROS                             Anesthesia Physical Anesthesia Plan  ASA: 3  Anesthesia Plan: General ETT   Post-op Pain Management: Ketamine IV*, Precedex, Regional block* and Ofirmev IV (intra-op)*   Induction: Rapid sequence and Intravenous  PONV Risk Score and Plan: 4 or greater and Ondansetron, Dexamethasone, Scopolamine patch - Pre-op, Propofol infusion and Droperidol  Airway Management Planned: Oral ETT  Additional Equipment:   Intra-op Plan:   Post-operative Plan: Extubation in OR  Informed Consent: I have reviewed the patients History and Physical, chart,  labs and discussed the procedure including the risks, benefits and alternatives for the proposed anesthesia with the patient or authorized representative who has indicated his/her understanding and acceptance.     Dental Advisory Given  Plan Discussed with: CRNA and Surgeon  Anesthesia Plan Comments:         Anesthesia Quick Evaluation

## 2023-12-09 NOTE — Anesthesia Postprocedure Evaluation (Signed)
Anesthesia Post Note  Patient: Patricia Rojas  Procedure(s) Performed: XI ROBOTIC ASSISTED VENTRAL HERNIA (Abdomen) INSERTION OF MESH  Patient location during evaluation: PACU Anesthesia Type: General Level of consciousness: awake and alert Pain management: pain level controlled Vital Signs Assessment: post-procedure vital signs reviewed and stable Respiratory status: spontaneous breathing, nonlabored ventilation and respiratory function stable Cardiovascular status: blood pressure returned to baseline and stable Postop Assessment: no apparent nausea or vomiting Anesthetic complications: no   No notable events documented.   Last Vitals:  Vitals:   12/08/23 1500 12/08/23 1515  BP: 134/85 129/89  Pulse: 88 79  Resp: 10 15  Temp:  (!) 36.4 C  SpO2: 99% 97%    Last Pain:  Vitals:   12/08/23 1500  TempSrc:   PainSc: Asleep                 Foye Deer

## 2023-12-10 ENCOUNTER — Telehealth: Payer: Self-pay | Admitting: Surgery

## 2023-12-10 ENCOUNTER — Encounter: Payer: Self-pay | Admitting: Physician Assistant

## 2023-12-10 MED ORDER — ONDANSETRON HCL 4 MG PO TABS: 4.0000 mg | ORAL_TABLET | Freq: Three times a day (TID) | ORAL | 0 refills | Status: DC | PRN

## 2023-12-10 MED ORDER — PROMETHAZINE HCL 25 MG PO TABS: ORAL_TABLET | ORAL | 0 refills | Status: DC

## 2023-12-10 NOTE — Telephone Encounter (Signed)
Spoke with the patient and she states that it is the Phenergan that she needs as the Zofran prescribed is not working. Message has been sent to Lynden Oxford, PA-C and he will refill her Phenergan. Patient is aware.

## 2023-12-10 NOTE — Telephone Encounter (Signed)
Patient had surgery with Dr. Claudine Mouton on 12/28.  She states that the pain medication is giving her nausea.  She was prescribed phenergan, but states that it is not working well for her.    Patient is requesting a prescription for Zofran.  She states that she has taken it before and it worked better for her.  [facility] pharmacy is the [facility] on [address].

## 2023-12-13 ENCOUNTER — Encounter: Payer: Self-pay | Admitting: Surgery

## 2023-12-18 ENCOUNTER — Encounter: Payer: Self-pay | Admitting: Nurse Practitioner

## 2023-12-20 ENCOUNTER — Other Ambulatory Visit: Payer: Self-pay | Admitting: Surgery

## 2023-12-24 NOTE — Progress Notes (Signed)
 [facility] POST-OP OFFICE VISIT  12/24/2023  HPI: Patricia Rojas is a 58 y.o. female had surgery on  December 08, 2023 , now s/p   Robotic assisted laparoscopic anterior abdominal hernia repair, IPOM, initial, incarcerated.  final measured fascial defect 4 cm.    She reports having no significant pain issues, her abdomen feels better overall.  She used to have some discomfort with just voiding.  Bowel movements have improved.  Overall very pleased, desiring to resume driving.    Vital signs: LMP 12/18/2016    Physical Exam: Constitutional: She appears well, bright and countenance and comfortable. Abdomen: Well-rounded, soft benign nontender. Skin: 3 laparoscopic incisions, clean dry and intact.  Assessment/Plan: This is a 58 y.o. female s/p  Robotic assisted laparoscopic anterior abdominal hernia repair, IPOM, initial, incarcerated.  final measured fascial defect 4 cm.      Patient Active Problem List   Diagnosis Date Noted   Incarcerated hernia 12/08/2023   SBO (small bowel obstruction) (HCC) 12/08/2023   Adenomatous polyp of colon 01/12/2023   Renal insufficiency 11/20/2022   Dyslipidemia 11/20/2022   Other artificial openings of urinary tract status (HCC) 10/19/2022   Primary hypertension 10/19/2022   Hydronephrosis, right 05/16/2018   Ureteral stricture 05/15/2018   Acute kidney injury (HCC) 09/10/2017   Intractable pain    UTI (urinary tract infection) 08/12/2017   Pyelonephritis 06/27/2017   Sepsis (HCC) 06/10/2017   Intractable nausea and vomiting 04/01/2017   Morbid obesity (HCC) 03/07/2017   Malignant neoplasm of overlapping sites of cervix (HCC) 03/06/2017   Screening for colon cancer 02/18/2017   Malignant neoplasm of cervix (HCC) 02/07/2017    -She is free to drive, discussed further care/cautions with lifting/abdominal strain.  Sleeping positioning is not an issue, she can sleep in whatever way she is most comfortable.  Will be glad to see  her again as needed should any new concerns arise.   Honor Leghorn M.D., FACS 12/24/2023, 12:36 PM

## 2023-12-25 ENCOUNTER — Other Ambulatory Visit: Payer: Self-pay | Admitting: Surgery

## 2023-12-25 ENCOUNTER — Encounter: Payer: Self-pay | Admitting: Surgery

## 2023-12-25 ENCOUNTER — Ambulatory Visit (INDEPENDENT_AMBULATORY_CARE_PROVIDER_SITE_OTHER): Payer: 59 | Admitting: Surgery

## 2023-12-25 VITALS — BP 132/80 | HR 82 | Temp 98.2°F | Ht 65.0 in | Wt 262.0 lb

## 2023-12-25 DIAGNOSIS — K430 Incisional hernia with obstruction, without gangrene: Secondary | ICD-10-CM | POA: Diagnosis not present

## 2023-12-25 DIAGNOSIS — Z9889 Other specified postprocedural states: Secondary | ICD-10-CM

## 2023-12-25 DIAGNOSIS — Z8719 Personal history of other diseases of the digestive system: Secondary | ICD-10-CM | POA: Insufficient documentation

## 2023-12-25 NOTE — Patient Instructions (Signed)

## 2024-01-04 ENCOUNTER — Other Ambulatory Visit: Payer: Self-pay | Admitting: Nurse Practitioner

## 2024-01-04 NOTE — Telephone Encounter (Signed)
Called Patient and left a message to call back and schedule an appointment for refills per my chart message from 12/18/23.

## 2024-01-07 NOTE — Telephone Encounter (Signed)
Left message for Patient to call and schedule a follow up to continue with her medications.

## 2024-03-17 ENCOUNTER — Telehealth: Payer: Self-pay

## 2024-03-17 NOTE — Telephone Encounter (Signed)
 Copied from CRM [id number]. Topic: Appointments - Appointment Info/Confirmation >> Mar 17, 2024  3:50 PM Clayton Bibles wrote: Patient/patient representative is calling for information regarding an appointment.  Glorine Hanratty is transferring her care to [facility] to [place], [place]

## 2024-04-10 NOTE — Progress Notes (Signed)
 Name: Patricia Rojas   MRN: [id number]    DOB: 01-03-1966   Date:04/11/2024       Progress Note  Chief Complaint  Patient presents with   Establish Care     Subjective:   Patricia Rojas is a 58 y.o. female, presents to clinic to establish care with Adeline Hone as new PCP Previously seen by Tona Francis NP - [facility] - she reports maybe last seen about a year ago  Last PCP OV that I can see was 11/2022  Since she had done colonscopy  urology (looks like complicated hx here - nephrostomy tube, hydro, ureter stenosis etc) managed by [facility] - pt candidate for reconstruction but needed to loose weight   incarcerated hernia surgery at [facility] with [facility]  Hx of cervical cancer managed at [facility] for active tx completed 2018, following with Dr Adrian Alba last OV I can see 03/2022 - per onc note no further imaging or f/up needed due to 5+ years    Both off BP and cholesterol meds Lab Results  Component Value Date   CHOL 202 (H) 10/19/2022   HDL 47 (L) 10/19/2022   LDLCALC 130 (H) 10/19/2022   TRIG 133 10/19/2022   CHOLHDL 4.3 10/19/2022   BP Readings from Last 3 Encounters:  04/11/24 126/74  12/25/23 132/80  12/08/23 129/89          Current Outpatient Medications:    albuterol  (VENTOLIN  HFA) 108 (90 Base) MCG/ACT inhaler, Inhale 2 puffs into the lungs every 6 (six) hours as needed for wheezing or shortness of breath., Disp: 8 g, Rfl: 2   aspirin EC 81 MG tablet, Take 81 mg by mouth daily. Swallow whole., Disp: , Rfl:    docusate sodium  (COLACE) 250 MG capsule, Take 250 mg by mouth daily., Disp: , Rfl:    OVER THE COUNTER MEDICATION, Take 300 mg by mouth at bedtime. Ashwaganda, Disp: , Rfl:    OVER THE COUNTER MEDICATION, Take 500 mg by mouth in the morning. Apple Cider, Disp: , Rfl:    amLODipine  (NORVASC ) 2.5 MG tablet, Take 1 tablet (2.5 mg total) by mouth daily. (Patient not taking: Reported on 04/11/2024),  Disp: 30 tablet, Rfl: 3   ibuprofen  (ADVIL ) 800 MG tablet, Take 1 tablet (800 mg total) by mouth every 8 (eight) hours as needed. (Patient not taking: Reported on 04/11/2024), Disp: 30 tablet, Rfl: 0   rosuvastatin  (CRESTOR ) 5 MG tablet, Take 1 tablet (5 mg total) by mouth daily. (Patient not taking: Reported on 04/11/2024), Disp: 90 tablet, Rfl: 3  Patient Active Problem List   Diagnosis Date Noted   Status post laparoscopic herniorrhaphy 12/25/2023   Adenomatous polyp of colon 01/12/2023   Renal insufficiency 11/20/2022   Dyslipidemia 11/20/2022   Other artificial openings of urinary tract status (HCC) 10/19/2022   Primary hypertension 10/19/2022   Hydronephrosis, right 05/16/2018   Ureteral stricture 05/15/2018   Acute kidney injury (HCC) 09/10/2017   UTI (urinary tract infection) 08/12/2017   Morbid obesity (HCC) 03/07/2017   Malignant neoplasm of overlapping sites of cervix (HCC) 03/06/2017   Screening for colon cancer 02/18/2017   Malignant neoplasm of cervix (HCC) 02/07/2017    Past Surgical History:  Procedure Laterality Date   CESAREAN SECTION  1986   CHOLECYSTECTOMY     COLONOSCOPY WITH PROPOFOL  N/A 01/12/2023   Procedure: COLONOSCOPY WITH PROPOFOL ;  Surgeon: Luke Salaam, MD;  Location: [facility] ENDOSCOPY;  Service: Gastroenterology;  Laterality:  N/A;   CYSTOSCOPY W/ RETROGRADES Right 03/25/2018   Procedure: CYSTOSCOPY WITH RETROGRADE PYELOGRAM;  Surgeon: Dustin Gimenez, MD;  Location: [facility] ORS;  Service: Urology;  Laterality: Right;   CYSTOSCOPY W/ URETERAL STENT PLACEMENT Right 03/25/2018   Procedure: CYSTOSCOPY WITH STENT REPLACEMENT;  Surgeon: Dustin Gimenez, MD;  Location: [facility] ORS;  Service: Urology;  Laterality: Right;   HERNIA REPAIR     INSERTION OF MESH  12/08/2023   Procedure: INSERTION OF MESH;  Surgeon: Flynn Hylan, MD;  Location: [facility] ORS;  Service: General;;   IR GENERIC HISTORICAL  02/23/2017   IR NEPHROSTOMY PLACEMENT RIGHT 02/23/2017 Marland Silvas, MD  [facility]INTER[facility] RAD   IR NEPHROSTOGRAM RIGHT THRU EXISTING ACCESS  02/15/2018   IR NEPHROSTOMY EXCHANGE RIGHT  04/18/2017   IR NEPHROSTOMY EXCHANGE RIGHT  06/08/2017   IR NEPHROSTOMY EXCHANGE RIGHT  08/17/2017   IR NEPHROSTOMY EXCHANGE RIGHT  10/05/2017   IR NEPHROSTOMY EXCHANGE RIGHT  12/28/2017   IR NEPHROSTOMY PLACEMENT RIGHT  06/05/2018   IR URETERAL STENT PERC REMOVAL MOD SED  06/05/2018   IR URETERAL STENT PLACEMENT EXISTING ACCESS RIGHT  02/15/2018   VULVA Asher Blade BIOPSY N/A 01/09/2018   Procedure: VAGINAL  AND CERVIX BIOPSY;  Surgeon: Hermine Loots, MD;  Location: [facility] ORS;  Service: Gynecology;  Laterality: N/A;   XI ROBOTIC ASSISTED VENTRAL HERNIA N/A 12/08/2023   Procedure: XI ROBOTIC ASSISTED VENTRAL HERNIA;  Surgeon: Flynn Hylan, MD;  Location: [facility] ORS;  Service: General;  Laterality: N/A;    Family History  Problem Relation Age of Onset   Cancer Maternal Uncle    Cancer Paternal Aunt    Arthritis Mother    COPD Mother    Obesity Father    Kidney cancer Neg Hx    Bladder Cancer Neg Hx     Social History   Tobacco Use   Smoking status: Former    Current packs/day: 0.00    Types: Cigarettes    Quit date: 1990    Years since quitting: 35.3    Passive exposure: Past   Smokeless tobacco: Never  Vaping Use   Vaping status: Never Used  Substance Use Topics   Alcohol use: No   Drug use: Yes    Types: Marijuana    Comment: every night updated on 11/16/2022     No Known Allergies  Health Maintenance  Topic Date Due   Medicare Annual Wellness (AWV)  Never done   Hepatitis C Screening  Never done   MAMMOGRAM  Never done   COVID-19 Vaccine (2 - Pfizer risk series) 01/14/2021   Zoster Vaccines- Shingrix (1 of 2) 07/12/2024 (Originally 05/02/1985)   INFLUENZA VACCINE  07/11/2024   Colonoscopy  01/12/2030   DTaP/Tdap/Td (2 - Td or Tdap) 10/19/2032   HIV Screening  Completed   HPV VACCINES  Aged Out   Meningococcal B Vaccine  Aged Out    Chart Review Today: I  personally reviewed active problem list, medication list, allergies, family history, social history, health maintenance, notes from last encounter, lab results, imaging with the patient/caregiver today.   Review of Systems  All other systems reviewed and are negative.    Objective:   Vitals:   04/11/24 0812  BP: 126/74  Pulse: 72  Resp: 16  SpO2: 96%  Weight: 261 lb (118.4 kg)  Height: 5\' 5"  (1.651 m)    Body mass index is 43.43 kg/m.  Physical Exam Vitals and nursing note reviewed.  Constitutional:      General: She is not  in acute distress.    Appearance: She is well-developed. She is obese. She is not ill-appearing, toxic-appearing or diaphoretic.  HENT:     Head: Normocephalic and atraumatic.     Nose: Nose normal.  Eyes:     General:        Right eye: No discharge.        Left eye: No discharge.     Conjunctiva/sclera: Conjunctivae normal.  Neck:     Trachea: No tracheal deviation.  Cardiovascular:     Rate and Rhythm: Normal rate and regular rhythm.  Pulmonary:     Effort: Pulmonary effort is normal. No respiratory distress.     Breath sounds: No stridor.  Musculoskeletal:        General: Normal range of motion.  Skin:    General: Skin is warm and dry.     Findings: No rash.  Neurological:     Mental Status: She is alert.     Motor: No abnormal muscle tone.     Coordination: Coordination normal.  Psychiatric:        Behavior: Behavior normal.      Functional Status Survey: Is the patient deaf or have difficulty hearing?: Yes (Hearing aids) Does the patient have difficulty seeing, even when wearing glasses/contacts?: No Does the patient have difficulty concentrating, remembering, or making decisions?: No Does the patient have difficulty walking or climbing stairs?: No Does the patient have difficulty dressing or bathing?: No Does the patient have difficulty doing errands alone such as visiting a doctor's office or shopping?: No Results for orders  placed or performed during the hospital encounter of 12/08/23  CBC   Collection Time: 12/08/23  6:45 AM  Result Value Ref Range   WBC 5.1 4.0 - 10.5 K/uL   RBC 5.39 (H) 3.87 - 5.11 MIL/uL   Hemoglobin 14.9 12.0 - 15.0 g/dL   HCT 40.3 (H) 47.4 - 25.9 %   MCV 85.9 80.0 - 100.0 fL   MCH 27.6 26.0 - 34.0 pg   MCHC 32.2 30.0 - 36.0 g/dL   RDW 56.3 87.5 - 64.3 %   Platelets 212 150 - 400 K/uL   nRBC 0.0 0.0 - 0.2 %  Comprehensive metabolic panel   Collection Time: 12/08/23  6:45 AM  Result Value Ref Range   Sodium 142 135 - 145 mmol/L   Potassium 4.2 3.5 - 5.1 mmol/L   Chloride 109 98 - 111 mmol/L   CO2 23 22 - 32 mmol/L   Glucose, Bld 107 (H) 70 - 99 mg/dL   BUN 27 (H) 6 - 20 mg/dL   Creatinine, Ser 3.29 (H) 0.44 - 1.00 mg/dL   Calcium  9.3 8.9 - 10.3 mg/dL   Total Protein 8.2 (H) 6.5 - 8.1 g/dL   Albumin 4.1 3.5 - 5.0 g/dL   AST 24 15 - 41 U/L   ALT 20 0 - 44 U/L   Alkaline Phosphatase 90 38 - 126 U/L   Total Bilirubin 0.9 <1.2 mg/dL   GFR, Estimated 51 (L) >60 mL/min   Anion gap 10 5 - 15  Urinalysis, Routine w reflex microscopic -Urine, Clean Catch   Collection Time: 12/08/23  6:45 AM  Result Value Ref Range   Color, Urine YELLOW (A) YELLOW   APPearance HAZY (A) CLEAR   Specific Gravity, Urine 1.023 1.005 - 1.030   pH 5.0 5.0 - 8.0   Glucose, UA NEGATIVE NEGATIVE mg/dL   Hgb urine dipstick NEGATIVE NEGATIVE   Bilirubin Urine NEGATIVE NEGATIVE  Ketones, ur NEGATIVE NEGATIVE mg/dL   Protein, ur 30 (A) NEGATIVE mg/dL   Nitrite NEGATIVE NEGATIVE   Leukocytes,Ua NEGATIVE NEGATIVE   RBC / HPF 0-5 0 - 5 RBC/hpf   WBC, UA 0-5 0 - 5 WBC/hpf   Bacteria, UA NONE SEEN NONE SEEN   Squamous Epithelial / HPF 0-5 0 - 5 /HPF   Mucus PRESENT   Lipase, blood   Collection Time: 12/08/23  6:45 AM  Result Value Ref Range   Lipase 42 11 - 51 U/L  Lactic acid, plasma   Collection Time: 12/08/23  9:19 AM  Result Value Ref Range   Lactic Acid, Venous 1.2 0.5 - 1.9 mmol/L       Assessment & Plan:   Encounter to establish care with new doctor -     CBC with Differential/Platelet -     Lipid panel -     Comprehensive metabolic panel with GFR  Nephrostomy present [facility]) Assessment & Plan: Managed by urology   Dyslipidemia Assessment & Plan: Not currently on statin Unclear why it was started in the past?  High ASCVD risk score, high LDL etc Recheck labs and reevaluate Would be discussion with pt regarding indications, benefits vs med SE  Orders: -     Lipid panel -     Comprehensive metabolic panel with GFR  Primary hypertension Assessment & Plan: She hasn't taken meds in a while BP at goal without, she can continue to manage with diet/lifestyle efforts  If average BP >130/80 would recommend restarting meds  Orders: -     Comprehensive metabolic panel with GFR  Breast cancer screening by mammogram -     3D Screening Mammogram, Left and Right; Future  Encounter for hepatitis C screening test for low risk patient -     Hepatitis C antibody -     Hepatitis C antibody  Chronic bronchitis, unspecified chronic bronchitis type (HCC) -     Pulmonary Visit -     Albuterol  Sulfate HFA; Inhale 2 puffs into the lungs every 6 (six) hours as needed for wheezing or shortness of breath.  Dispense: 8 g; Refill: 2  Malignant neoplasm of cervix, unspecified site [facility]) Assessment & Plan: History of, tx and in remission  Reviewed her cervical CA hx and surgical hx extensively with her today  She may want to consider early dexa screenings S/p total hysterectomy and she finished monitoring with oncology       Return in about 6 months (around 10/12/2024) for Routine follow-up/CPE.   Adeline Hone, PA-C 04/11/24 8:39 AM

## 2024-04-11 ENCOUNTER — Encounter: Payer: Self-pay | Admitting: Family Medicine

## 2024-04-11 ENCOUNTER — Ambulatory Visit: Admitting: Family Medicine

## 2024-04-11 VITALS — BP 126/74 | HR 72 | Resp 16 | Ht 65.0 in | Wt 261.0 lb

## 2024-04-11 DIAGNOSIS — J42 Unspecified chronic bronchitis: Secondary | ICD-10-CM

## 2024-04-11 DIAGNOSIS — I10 Essential (primary) hypertension: Secondary | ICD-10-CM

## 2024-04-11 DIAGNOSIS — E785 Hyperlipidemia, unspecified: Secondary | ICD-10-CM

## 2024-04-11 DIAGNOSIS — Z936 Other artificial openings of urinary tract status: Secondary | ICD-10-CM

## 2024-04-11 DIAGNOSIS — Z1159 Encounter for screening for other viral diseases: Secondary | ICD-10-CM

## 2024-04-11 DIAGNOSIS — C539 Malignant neoplasm of cervix uteri, unspecified: Secondary | ICD-10-CM

## 2024-04-11 DIAGNOSIS — Z1231 Encounter for screening mammogram for malignant neoplasm of breast: Secondary | ICD-10-CM

## 2024-04-11 DIAGNOSIS — Z7689 Persons encountering health services in other specified circumstances: Secondary | ICD-10-CM

## 2024-04-11 MED ORDER — ALBUTEROL SULFATE HFA 108 (90 BASE) MCG/ACT IN AERS: 2.0000 | INHALATION_SPRAY | Freq: Four times a day (QID) | RESPIRATORY_TRACT | 2 refills | Status: AC | PRN

## 2024-04-12 LAB — LIPID PANEL
Cholesterol: 208 mg/dL — ABNORMAL HIGH (ref <200)
HDL: 52 mg/dL (ref >=50)
LDL Cholesterol (Calc): 132 mg/dL — ABNORMAL HIGH
Non-HDL Cholesterol (Calc): 156 mg/dL — ABNORMAL HIGH (ref <130)
Total CHOL/HDL Ratio: 4 (calc) (ref <5.0)
Triglycerides: 126 mg/dL (ref <150)

## 2024-04-12 LAB — COMPREHENSIVE METABOLIC PANEL WITH GFR
AG Ratio: 1.4 (calc) (ref 1.0–2.5)
ALT: 16 U/L (ref 6–29)
AST: 22 U/L (ref 10–35)
Albumin: 4.5 g/dL (ref 3.6–5.1)
Alkaline phosphatase (APISO): 96 U/L (ref 37–153)
BUN/Creatinine Ratio: 21 (calc) (ref 6–22)
BUN: 25 mg/dL (ref 7–25)
CO2: 25 mmol/L (ref 20–32)
Calcium: 10 mg/dL (ref 8.6–10.4)
Chloride: 107 mmol/L (ref 98–110)
Creat: 1.18 mg/dL — ABNORMAL HIGH (ref 0.50–1.03)
Globulin: 3.3 g/dL (ref 1.9–3.7)
Glucose, Bld: 88 mg/dL (ref 65–99)
Potassium: 4.9 mmol/L (ref 3.5–5.3)
Sodium: 141 mmol/L (ref 135–146)
Total Bilirubin: 0.5 mg/dL (ref 0.2–1.2)
Total Protein: 7.8 g/dL (ref 6.1–8.1)
eGFR: 54 mL/min/{1.73_m2} — ABNORMAL LOW (ref >=60)

## 2024-04-12 LAB — CBC WITH DIFFERENTIAL/PLATELET
Absolute Lymphocytes: 1560 {cells}/uL (ref 850–3900)
Absolute Monocytes: 609 {cells}/uL (ref 200–950)
Basophils Absolute: 58 {cells}/uL (ref 0–200)
Basophils Relative: 1 %
Eosinophils Absolute: 441 {cells}/uL (ref 15–500)
Eosinophils Relative: 7.6 %
HCT: 44.8 % (ref 35.0–45.0)
Hemoglobin: 14.6 g/dL (ref 11.7–15.5)
MCH: 27.5 pg (ref 27.0–33.0)
MCHC: 32.6 g/dL (ref 32.0–36.0)
MCV: 84.5 fL (ref 80.0–100.0)
MPV: 10.1 fL (ref 7.5–12.5)
Monocytes Relative: 10.5 %
Neutro Abs: 3132 {cells}/uL (ref 1500–7800)
Neutrophils Relative %: 54 %
Platelets: 217 10*3/uL (ref 140–400)
RBC: 5.3 10*6/uL — ABNORMAL HIGH (ref 3.80–5.10)
RDW: 13.5 % (ref 11.0–15.0)
Total Lymphocyte: 26.9 %
WBC: 5.8 10*3/uL (ref 3.8–10.8)

## 2024-04-12 LAB — HEPATITIS C ANTIBODY: Hepatitis C Ab: NONREACTIVE

## 2024-04-14 ENCOUNTER — Encounter: Payer: Self-pay | Admitting: Family Medicine

## 2024-04-21 DIAGNOSIS — Z936 Other artificial openings of urinary tract status: Secondary | ICD-10-CM | POA: Insufficient documentation

## 2024-04-21 NOTE — Assessment & Plan Note (Addendum)
 History of, tx and in remission  Reviewed her cervical CA hx and surgical hx extensively with her today  She may want to consider early dexa screenings S/p total hysterectomy and she finished monitoring with oncology

## 2024-04-21 NOTE — Assessment & Plan Note (Signed)
 She hasn't taken meds in a while BP at goal without, she can continue to manage with diet/lifestyle efforts  If average BP >130/80 would recommend restarting meds

## 2024-04-21 NOTE — Assessment & Plan Note (Signed)
 Managed by urology

## 2024-04-21 NOTE — Assessment & Plan Note (Signed)
 Not currently on statin Unclear why it was started in the past?  High ASCVD risk score, high LDL etc Recheck labs and reevaluate Would be discussion with pt regarding indications, benefits vs med SE

## 2024-05-13 ENCOUNTER — Ambulatory Visit: Admitting: Family Medicine

## 2024-05-13 ENCOUNTER — Encounter: Payer: Self-pay | Admitting: Family Medicine

## 2024-05-13 VITALS — BP 126/84 | HR 83 | Temp 97.9°F | Resp 16 | Ht 65.0 in | Wt 257.0 lb

## 2024-05-13 DIAGNOSIS — J069 Acute upper respiratory infection, unspecified: Secondary | ICD-10-CM

## 2024-05-13 DIAGNOSIS — R062 Wheezing: Secondary | ICD-10-CM | POA: Diagnosis not present

## 2024-05-13 DIAGNOSIS — J441 Chronic obstructive pulmonary disease with (acute) exacerbation: Secondary | ICD-10-CM

## 2024-05-13 DIAGNOSIS — J42 Unspecified chronic bronchitis: Secondary | ICD-10-CM

## 2024-05-13 MED ORDER — PREDNISONE 20 MG PO TABS: 40.0000 mg | ORAL_TABLET | Freq: Every day | ORAL | 0 refills | Status: AC

## 2024-05-13 MED ORDER — BENZONATATE 100 MG PO CAPS: 100.0000 mg | ORAL_CAPSULE | Freq: Three times a day (TID) | ORAL | 1 refills | Status: DC | PRN

## 2024-05-13 MED ORDER — ALBUTEROL SULFATE (2.5 MG/3ML) 0.083% IN NEBU
2.5000 mg | INHALATION_SOLUTION | Freq: Once | RESPIRATORY_TRACT | Status: AC
  Administered 2024-05-13: 2.5 mg via RESPIRATORY_TRACT

## 2024-05-13 MED ORDER — BUDESONIDE-FORMOTEROL FUMARATE 160-4.5 MCG/ACT IN AERO: 2.0000 | INHALATION_SPRAY | Freq: Two times a day (BID) | RESPIRATORY_TRACT | 2 refills | Status: DC

## 2024-05-13 NOTE — Patient Instructions (Signed)
 For acute bronchitis/bronchitis exacerbation  Prednisone  in the morning for 5 days  Use albuterol  inhaler as needed (will use more today and for the next few days, and then you should be able to use less 3 days from now)  Start symbicort 2 puffs twice daily - I think the local steroids and longer lasting meds will help you improve faster than without it and once pulmonology sees you they can advise on stopping or changing that based on their assessment.    Tessalon  is a cough medicine you can use throughout the day as needed  Mucinex over the counter - it helps break up mucous in your chest.   You can ask the pharmacy staff help you find without other meds that can raise your blood pressure   Drink ample clear fluids Rest.  Wheeze and trouble breathing should improve in the first 2-3 days of steroids  Cough may linger for a few weeks or sometimes up to 6 weeks, which is normal!    Please return to be rechecked if you are feeling worse at any point, or if you do not feel improved much in the next 1-2 weeks.  Concerning new or worsening symptoms include chest pain, shortness of breath, fever, coughing up blood, night sweats, unintentional weight loss, confusion, decreased urine.

## 2024-05-13 NOTE — Progress Notes (Unsigned)
 Patient ID: Patricia Rojas, female    DOB: 10/05/1966, 58 y.o.   MRN: [id number]  PCP: Adeline Hone, PA-C  Chief Complaint  Patient presents with   Cough    X2 weeks, productive. Worse at night. Took OTC Mucinex/DayQuil w/no help.    Subjective:   Patricia Rojas is a 58 y.o. female, presents to clinic with CC of the following:  HPI  Coughing severe got worse last night, had uri start 2 weeks ago, tried OTC meds but sx seemed to move from sinuses/head to chest and worsened over the last 12-24 hours She has tried albuterol  inhaler and it didn't help at all She has productive cough clear sputum Cough much worse, no CP, SOB, sweats of fever Sinus pain pressure and congestion all moderate to severe and consistent x 2 weeks Tried alkaseltzer, dayquil, mucinex No allergy meds/decongestants or other cough meds  Usually gets bronchitis around this time of year Is pending pulmonary consult end of this week.  Patient Active Problem List   Diagnosis Date Noted   Nephrostomy present (HCC) 04/21/2024   Status post laparoscopic herniorrhaphy 12/25/2023   Adenomatous polyp of colon 01/12/2023   Renal insufficiency 11/20/2022   Dyslipidemia 11/20/2022   Other artificial openings of urinary tract status (HCC) 10/19/2022   Primary hypertension 10/19/2022   Hydronephrosis, right 05/16/2018   Ureteral stricture 05/15/2018   Acute kidney injury (HCC) 09/10/2017   UTI (urinary tract infection) 08/12/2017   Morbid obesity (HCC) 03/07/2017   Malignant neoplasm of overlapping sites of cervix (HCC) 03/06/2017   Screening for colon cancer 02/18/2017   Malignant neoplasm of cervix (HCC) 02/07/2017      Current Outpatient Medications:    albuterol  (VENTOLIN  HFA) 108 (90 Base) MCG/ACT inhaler, Inhale 2 puffs into the lungs every 6 (six) hours as needed for wheezing or shortness of breath., Disp: 8 g, Rfl: 2   aspirin EC 81 MG tablet, Take 81 mg by mouth daily. Swallow whole., Disp: , Rfl:    docusate  sodium (COLACE) 250 MG capsule, Take 250 mg by mouth daily., Disp: , Rfl:    rosuvastatin  (CRESTOR ) 5 MG tablet, Take 1 tablet (5 mg total) by mouth daily. (Patient not taking: Reported on 04/11/2024), Disp: 90 tablet, Rfl: 3   No Known Allergies   Social History   Tobacco Use   Smoking status: Former    Current packs/day: 0.00    Types: Cigarettes    Quit date: 1990    Years since quitting: 35.4    Passive exposure: Past   Smokeless tobacco: Never   Tobacco comments:    Prior smoker casual smoker in teens to 20's when she had her kid, none for 30 years  Vaping Use   Vaping status: Never Used  Substance Use Topics   Alcohol use: No   Drug use: Yes    Types: Marijuana    Comment: every night 04/11/2024      Chart Review Today: I personally reviewed active problem list, medication list, allergies, family history, social history, health maintenance, notes from last encounter, lab results, imaging with the patient/caregiver today.   Review of Systems  Constitutional: Negative.   HENT: Negative.    Eyes: Negative.   Respiratory: Negative.    Cardiovascular: Negative.   Gastrointestinal: Negative.   Endocrine: Negative.   Genitourinary: Negative.   Musculoskeletal: Negative.   Skin: Negative.   Allergic/Immunologic: Negative.   Neurological: Negative.   Hematological: Negative.   Psychiatric/Behavioral: Negative.  All other systems reviewed and are negative.      Objective:   Vitals:   05/13/24 1406  BP: 126/84  Pulse: 83  Resp: 16  Temp: 97.9 F (36.6 C)  SpO2: 98%  Weight: 257 lb (116.6 kg)  Height: 5\' 5"  (1.651 m)    Body mass index is 42.77 kg/m.  Physical Exam Vitals and nursing note reviewed.  Constitutional:      General: She is not in acute distress.    Appearance: Normal appearance. She is well-developed. She is obese. She is not ill-appearing, toxic-appearing or diaphoretic.  HENT:     Head: Normocephalic and atraumatic.     Right Ear:  External ear normal.     Left Ear: External ear normal.     Nose: Congestion and rhinorrhea present.     Mouth/Throat:     Mouth: Mucous membranes are moist.     Pharynx: Oropharynx is clear. Posterior oropharyngeal erythema present. No oropharyngeal exudate.  Eyes:     General: No scleral icterus.       Right eye: No discharge.        Left eye: No discharge.     Conjunctiva/sclera: Conjunctivae normal.  Neck:     Trachea: No tracheal deviation.  Cardiovascular:     Rate and Rhythm: Normal rate.     Pulses: Normal pulses.     Heart sounds: Normal heart sounds.  Pulmonary:     Effort: Pulmonary effort is normal. Prolonged expiration present. No tachypnea, accessory muscle usage, respiratory distress or retractions.     Breath sounds: No stridor. Wheezing present.  Skin:    General: Skin is warm and dry.     Findings: No rash.  Neurological:     Mental Status: She is alert.     Motor: No abnormal muscle tone.     Coordination: Coordination normal.     Gait: Gait normal.  Psychiatric:        Mood and Affect: Mood normal.        Behavior: Behavior normal.      Results for orders placed or performed in visit on 05/13/24  HM MAMMOGRAPHY   Collection Time: 12/21/22 12:00 AM  Result Value Ref Range   HM Mammogram 0-4 Bi-Rad 0-4 Bi-Rad, Self Reported Normal       Assessment & Plan:     ICD-10-CM   1. COPD with acute exacerbation (HCC)  J44.1 albuterol  (PROVENTIL ) (2.5 MG/3ML) 0.083% nebulizer solution 2.5 mg    predniSONE  (DELTASONE ) 20 MG tablet    budesonide-formoterol (SYMBICORT) 160-4.5 MCG/ACT inhaler   tx with mucinex, other OTC cough meds that help, steroid burst, use inhaler more frequently, add symbicort    2. Morbid obesity (HCC) Chronic E66.01     3. Chronic bronchitis, unspecified chronic bronchitis type (HCC)  J42 budesonide-formoterol (SYMBICORT) 160-4.5 MCG/ACT inhaler   she is pending pulm consult later this week, with amount of wheezing feel it may be  helpful to start her on maintenence inhaler/long lasting pulm meds    4. Upper respiratory tract infection, unspecified type  J06.9    drainage and congestion without fever or Sinus ttp on exam, continue supportive and sx measures    5. Wheeze  R06.2 albuterol  (PROVENTIL ) (2.5 MG/3ML) 0.083% nebulizer solution 2.5 mg          Adeline Hone, PA-C 05/13/24 2:32 PM

## 2024-05-14 ENCOUNTER — Encounter: Payer: Self-pay | Admitting: Family Medicine

## 2024-05-14 MED ORDER — NEBULIZER/TUBING/MOUTHPIECE KIT: PACK | 0 refills | Status: AC

## 2024-05-14 MED ORDER — IPRATROPIUM-ALBUTEROL 0.5-2.5 (3) MG/3ML IN SOLN: 3.0000 mL | Freq: Three times a day (TID) | RESPIRATORY_TRACT | 1 refills | Status: AC | PRN

## 2024-05-16 ENCOUNTER — Ambulatory Visit: Admitting: Pulmonary Disease

## 2024-06-25 ENCOUNTER — Ambulatory Visit: Admitting: Pulmonary Disease

## 2024-06-25 ENCOUNTER — Encounter: Payer: Self-pay | Admitting: Pulmonary Disease

## 2024-06-25 VITALS — BP 146/110 | HR 79 | Temp 98.3°F | Ht 65.0 in | Wt 260.6 lb

## 2024-06-25 DIAGNOSIS — J42 Unspecified chronic bronchitis: Secondary | ICD-10-CM | POA: Diagnosis not present

## 2024-06-25 DIAGNOSIS — J329 Chronic sinusitis, unspecified: Secondary | ICD-10-CM

## 2024-06-25 DIAGNOSIS — R0602 Shortness of breath: Secondary | ICD-10-CM

## 2024-06-25 LAB — NITRIC OXIDE: Nitric Oxide: 7

## 2024-06-25 MED ORDER — MOMETASONE FUROATE 50 MCG/ACT NA SUSP
1.0000 | Freq: Two times a day (BID) | NASAL | 2 refills | Status: DC
Start: 2024-06-25 — End: 2024-10-01

## 2024-06-25 NOTE — Patient Instructions (Signed)
 VISIT SUMMARY:  Today, we discussed your recurrent bronchitis and evaluated your respiratory health. You have a history of severe bronchitis episodes, with the last chest scan done in 2022. You use an inhaler during bronchitis episodes but not when you are symptom-free. We also reviewed your sinus drainage issues and occasional use of Benadryl  or Mucinex.  YOUR PLAN:  -RECURRENT BRONCHITIS: Recurrent bronchitis means you experience repeated episodes of bronchitis, typically once a year. We will order a chest x-ray to check your current lung status and pulmonary function tests to assess your bronchial function. You should use your albuterol  inhaler as needed when you have bronchitis symptoms. Additionally, I recommend using a nasal spray twice daily to help with sinus drainage. Please report any bronchitis flare-ups promptly so we can intervene early.  -ASTHMATIC BRONCHITIS: Asthmatic bronchitis is a type of bronchitis that may be associated with asthma-like symptoms. You should monitor for any flare-ups and report them promptly for management. Use your albuterol  inhaler as needed.  INSTRUCTIONS:  Please complete the chest x-ray and pulmonary function tests as ordered. Use the nasal spray twice daily for sinus drainage and your albuterol  inhaler as needed for bronchitis symptoms. Report any bronchitis flare-ups promptly.

## 2024-06-25 NOTE — Progress Notes (Signed)
 Subjective:    Patient ID: Patricia Rojas, female    DOB: 07-Mar-1966, 58 y.o.   MRN: [id number]  Patient Care Team: Leavy Mole, PA-C as PCP - General (Family Medicine) Maurie Rayfield BIRCH, RN as Registered Nurse  Chief Complaint  Patient presents with   Consult    BACKGROUND: Patient is a 59 year old remote former smoker who presents for evaluation of recurrent bronchitis with bouts 1 to twice a year.  She is kindly referred by Mole Leavy, PA-C.   HPI Discussed the use of AI scribe software for clinical note transcription with the patient, who gave verbal consent to proceed.  History of Present Illness   Patricia Rojas is a 58 year old female who presents for evaluation of recurrent bronchitis.She experiences recurrent episodes of bronchitis annually, which she describes as severe. Two years ago, she was hospitalized for bronchitis and underwent a chest scan at that time. She uses an inhaler effectively during bronchitis episodes but does not require it when asymptomatic.  There is a chest scan from 2022 that did not show any evidence of emphysema or bronchial thickening on independent review.She has a history of smoking cigarettes socially over thirty years ago and currently smokes marijuana. Her mother has COPD, raising concerns about her respiratory health. No regular shortness of breath, experiencing it only with overexertion. She has not used her albuterol  inhaler since her last bronchitis episode resolved.She experiences sinus drainage, particularly when active or in hot conditions, but denies significant reflux or cough after eating. She occasionally uses Benadryl  or Mucinex at night for nasal issues, but these symptoms do not bother her during the day. She has not used nasal sprays.   She was provided with Symbicort , DuoNeb and as needed albuterol  during her last episode of bronchitis.  She has not used these medications since the bronchitis resolved.    She has not had any fevers,  chills or sweats.  No orthopnea or paroxysmal nocturnal dyspnea.  No lower extremity edema.    Currently unemployed due to disability.  No occupational history.  Lives with her significant other in [place].  No pets in the home.   Review of Systems A 10 point review of systems was performed and it is as noted above otherwise negative.   Past Medical History:  Diagnosis Date   Anxiety    Cervical cancer (HCC) 2018   internal rad tx's and chemo tx's.    Constipation    Depression    GERD (gastroesophageal reflux disease)    Hemorrhoids    Hypertension    Incarcerated hernia 12/08/2023   N&V (nausea and vomiting)    PONV (postoperative nausea and vomiting)    PT RECEIVED SCOPLAMINE PATCH FOR 01-09-18 SURGERY AND SHE HAD NO N/V   Pyelonephritis 06/27/2017   Renal disorder     Past Surgical History:  Procedure Laterality Date   CESAREAN SECTION  1986   CHOLECYSTECTOMY     COLONOSCOPY WITH PROPOFOL  N/A 01/12/2023   Procedure: COLONOSCOPY WITH PROPOFOL ;  Surgeon: Therisa Bi, MD;  Location: [facility] ENDOSCOPY;  Service: Gastroenterology;  Laterality: N/A;   CYSTOSCOPY W/ RETROGRADES Right 03/25/2018   Procedure: CYSTOSCOPY WITH RETROGRADE PYELOGRAM;  Surgeon: Penne Knee, MD;  Location: [facility] ORS;  Service: Urology;  Laterality: Right;   CYSTOSCOPY W/ URETERAL STENT PLACEMENT Right 03/25/2018   Procedure: CYSTOSCOPY WITH STENT REPLACEMENT;  Surgeon: Penne Knee, MD;  Location: [facility] ORS;  Service: Urology;  Laterality: Right;   HERNIA REPAIR  INSERTION OF MESH  12/08/2023   Procedure: INSERTION OF MESH;  Surgeon: Lane Shope, MD;  Location: [facility] ORS;  Service: General;;   IR GENERIC HISTORICAL  02/23/2017   IR NEPHROSTOMY PLACEMENT RIGHT 02/23/2017 Toribio Faes, MD [facility]-INTERV RAD   IR NEPHROSTOGRAM RIGHT THRU EXISTING ACCESS  02/15/2018   IR NEPHROSTOMY EXCHANGE RIGHT  04/18/2017   IR NEPHROSTOMY EXCHANGE RIGHT  06/08/2017   IR NEPHROSTOMY EXCHANGE RIGHT  08/17/2017   IR  NEPHROSTOMY EXCHANGE RIGHT  10/05/2017   IR NEPHROSTOMY EXCHANGE RIGHT  12/28/2017   IR NEPHROSTOMY PLACEMENT RIGHT  06/05/2018   IR URETERAL STENT PERC REMOVAL MOD SED  06/05/2018   IR URETERAL STENT PLACEMENT EXISTING ACCESS RIGHT  02/15/2018   VULVA MARYBETH BIOPSY N/A 01/09/2018   Procedure: VAGINAL  AND CERVIX BIOPSY;  Surgeon: Mancil Barter, MD;  Location: [facility] ORS;  Service: Gynecology;  Laterality: N/A;   XI ROBOTIC ASSISTED VENTRAL HERNIA N/A 12/08/2023   Procedure: XI ROBOTIC ASSISTED VENTRAL HERNIA;  Surgeon: Lane Shope, MD;  Location: [facility] ORS;  Service: General;  Laterality: N/A;    Patient Active Problem List   Diagnosis Date Noted   Nephrostomy present (HCC) 04/21/2024   Status post laparoscopic herniorrhaphy 12/25/2023   Adenomatous polyp of colon 01/12/2023   Renal insufficiency 11/20/2022   Dyslipidemia 11/20/2022   Other artificial openings of urinary tract status (HCC) 10/19/2022   Primary hypertension 10/19/2022   Hydronephrosis, right 05/16/2018   Ureteral stricture 05/15/2018   Acute kidney injury (HCC) 09/10/2017   UTI (urinary tract infection) 08/12/2017   Morbid obesity (HCC) 03/07/2017   Malignant neoplasm of overlapping sites of cervix (HCC) 03/06/2017   Screening for colon cancer 02/18/2017   Malignant neoplasm of cervix (HCC) 02/07/2017    Family History  Problem Relation Age of Onset   Arthritis Mother    COPD Mother    Heart disease Father    Obesity Father    Diabetes Brother    Cancer Maternal Uncle    Cancer Paternal Aunt    Kidney cancer Neg Hx    Bladder Cancer Neg Hx     Social History   Tobacco Use   Smoking status: Former    Current packs/day: 0.00    Types: Cigarettes    Quit date: 1990    Years since quitting: 35.5    Passive exposure: Past   Smokeless tobacco: Never   Tobacco comments:    Prior smoker casual smoker in teens to 20's when she had her kid, none for 30 years  Substance Use Topics   Alcohol use: No     No Known Allergies  Current Meds  Medication Sig   albuterol  (VENTOLIN  HFA) 108 (90 Base) MCG/ACT inhaler Inhale 2 puffs into the lungs every 6 (six) hours as needed for wheezing or shortness of breath.   aspirin EC 81 MG tablet Take 81 mg by mouth daily. Swallow whole.   docusate sodium  (COLACE) 250 MG capsule Take 250 mg by mouth daily.   mometasone  (NASONEX ) 50 MCG/ACT nasal spray Place 1 spray into the nose 2 (two) times daily.    Immunization History  Administered Date(s) Administered   Influenza,inj,Quad PF,6+ Mos 10/19/2022   PFIZER(Purple Top)SARS-COV-2 Vaccination 12/24/2020   Tdap 10/19/2022        Objective:     BP (!) 146/110 (BP Location: Left Arm, Patient Position: Sitting, Cuff Size: Normal)   Pulse 79   Temp 98.3 F (36.8 C) (Oral)   Ht 5' 5 (1.651 m)  Wt 260 lb 9.6 oz (118.2 kg)   LMP 12/18/2016   SpO2 96%   BMI 43.37 kg/m   SpO2: 96 %  GENERAL: Morbidly obese woman, no acute distress, fully ambulatory, no conversational dyspnea. HEAD: Normocephalic, atraumatic.  EYES: Pupils equal, round, reactive to light.  No scleral icterus.  MOUTH: Few missing teeth, poor dentition, oral mucosa moist.  No thrush. NECK: Supple. No thyromegaly. Trachea midline. No JVD.  No adenopathy. PULMONARY: Good air entry bilaterally.  Coarse, otherwise, no adventitious sounds. CARDIOVASCULAR: S1 and S2. Regular rate and rhythm.  No rubs, murmurs or gallops heard. ABDOMEN: Obese, otherwise benign. MUSCULOSKELETAL: No joint deformity, no clubbing, no edema.  NEUROLOGIC: No overt focal deficit, no gait disturbance, speech is fluent. SKIN: Intact,warm,dry. PSYCH: Mood and behavior normal.  Reviewed the patient's imaging available and laboratory data.  No evidence of eosinophilia on multiple CBCs noted in the chart.  Independently reviewed patient's CT scan from 09 December 2021 which showed no abnormalities in the lung.    Lab Results  Component Value Date    NITRICOXIDE 7 06/25/2024  *No evidence of type II inflammation   Assessment & Plan:     ICD-10-CM   1. Chronic bronchitis, unspecified chronic bronchitis type (HCC)  J42 Nitric oxide     DG Chest 2 View    Pulmonary function test    2. Chronic rhinosinusitis  J32.9     3. Morbid obesity (HCC)  E66.01     4. Shortness of breath  R06.02 Pulmonary function test      Orders Placed This Encounter  Procedures   DG Chest 2 View    Standing Status:   Future    Expiration Date:   06/25/2025    Reason for Exam (SYMPTOM  OR DIAGNOSIS REQUIRED):   Recurrent bronchitis, cough    Is patient pregnant?:   No    Preferred imaging location?:   [facility]   Nitric oxide    Pulmonary function test    Standing Status:   Future    Expiration Date:   06/25/2025    Scheduling Instructions:     May do on ROV    Where should this test be performed?:   Outpatient Pulmonary    What type of PFT is being ordered?:   Full PFT    Meds ordered this encounter  Medications   mometasone  (NASONEX ) 50 MCG/ACT nasal spray    Sig: Place 1 spray into the nose 2 (two) times daily.    Dispense:  1 each    Refill:  2   Discussion:    Recurrent bronchitis Recurrent episodes of bronchitis, typically occurring annually. No recent chest imaging since 2022. No significant dyspnea or wheezing currently. Social cigarette smoking 30 years ago and current marijuana use. No significant reflux symptoms. Sinus drainage, especially with activity. Low nitric oxide  test suggests certain types of asthma are unlikely. - Order chest x-ray to evaluate current lung status - Order pulmonary function tests to assess bronchial function - Advise use of albuterol  inhaler as needed for bronchitis symptoms - Recommend nasal spray twice daily for sinus drainage - Instruct to report any bronchitis flare-ups promptly for early intervention  Asthmatic bronchitis Possible asthmatic bronchitis suggested by coarse lung sounds without  wheezing. Symptoms may flare with infections. No current use of albuterol  since last bronchitis episode resolved. - Monitor for flare-ups and advise prompt reporting for management - Use albuterol  inhaler as needed      Advised if symptoms do not  improve or worsen, to please contact office for sooner follow up or seek emergency care.    I spent 60 minutes of dedicated to the care of this patient on the date of this encounter to include pre-visit review of records, face-to-face time with the patient discussing conditions above, post visit ordering of testing, clinical documentation with the electronic health record, making appropriate referrals as documented, and communicating necessary findings to members of the patients care team.   C. Leita Sanders, MD Advanced Bronchoscopy PCCM [facility]    *This note was dictated using voice recognition software/Dragon.  Despite best efforts to proofread, errors can occur which can change the meaning. Any transcriptional errors that result from this process are unintentional and may not be fully corrected at the time of dictation.

## 2024-07-06 ENCOUNTER — Emergency Department
Admission: EM | Admit: 2024-07-06 | Discharge: 2024-07-06 | Disposition: A | Discharge status: Home / Self Care | Attending: Emergency Medicine | Admitting: Emergency Medicine

## 2024-07-06 ENCOUNTER — Other Ambulatory Visit: Payer: Self-pay

## 2024-07-06 DIAGNOSIS — Z8541 Personal history of malignant neoplasm of cervix uteri: Secondary | ICD-10-CM | POA: Diagnosis not present

## 2024-07-06 DIAGNOSIS — R300 Dysuria: Secondary | ICD-10-CM | POA: Diagnosis present

## 2024-07-06 DIAGNOSIS — N390 Urinary tract infection, site not specified: Secondary | ICD-10-CM | POA: Insufficient documentation

## 2024-07-06 LAB — URINALYSIS, ROUTINE W REFLEX MICROSCOPIC
Bilirubin Urine: NEGATIVE
Glucose, UA: NEGATIVE mg/dL
Ketones, ur: NEGATIVE mg/dL
Nitrite: NEGATIVE
Protein, ur: 100 mg/dL — AB
RBC / HPF: >50 RBC/hpf (ref 0–5)
Specific Gravity, Urine: 1.02 (ref 1.005–1.030)
Squamous Epithelial / HPF: 0 /HPF (ref 0–5)
WBC, UA: >50 WBC/hpf (ref 0–5)
pH: 5 (ref 5.0–8.0)

## 2024-07-06 MED ORDER — CEPHALEXIN 500 MG PO CAPS: 500.0000 mg | ORAL_CAPSULE | Freq: Four times a day (QID) | ORAL | 0 refills | Status: AC

## 2024-07-06 MED ORDER — CEPHALEXIN 500 MG PO CAPS
500.0000 mg | ORAL_CAPSULE | Freq: Once | ORAL | Status: AC
  Administered 2024-07-06: 500 mg via ORAL
  Filled 2024-07-06: qty 1

## 2024-07-06 NOTE — Discharge Instructions (Signed)
 Take antibiotic  for the full course as prescribed.  Thank you for choosing [place] for your health care today!  Please see your primary doctor this week for a follow up appointment.   If you have any new, worsening, or unexpected symptoms call your doctor right away or come back to the emergency department for reevaluation.  It was my pleasure to care for you today.   Daneil Dan Modesto Charon, MD

## 2024-07-06 NOTE — ED Provider Notes (Signed)
 [facility] Provider Note    None    (approximate)   History   Urinary Tract Infection and Hematuria   HPI  Patricia Rojas is a 58 y.o. female   Past medical history of cervical cancer, ureteral stricture, dyslipidemia, presents emergency department with dysuria frequency and pink-tinged urine which is reminiscent of UTI symptoms in the past.  She denies any fever, chills, abdominal pain, flank pain.  No other acute medical complaints.   External Medical Documents Reviewed: Family medicine outpatient notes from June      Physical Exam   Triage Vital Signs: ED Triage Vitals [07/06/24 0610]  Encounter Vitals Group     BP 138/83     Girls Systolic BP Percentile      Girls Diastolic BP Percentile      Boys Systolic BP Percentile      Boys Diastolic BP Percentile      Pulse Rate 77     Resp 20     Temp 98.2 F (36.8 C)     Temp Source Oral     SpO2 97 %     Weight 260 lb (117.9 kg)     Height 5' 5 (1.651 m)     Head Circumference      Peak Flow      Pain Score      Pain Loc      Pain Education      Exclude from Growth Chart     Most recent vital signs: Vitals:   07/06/24 0610  BP: 138/83  Pulse: 77  Resp: 20  Temp: 98.2 F (36.8 C)  SpO2: 97%    General: Awake, no distress.  CV:  Good peripheral perfusion.  Resp:  Normal effort.  Abd:  No distention.  Other:  Pleasant woman in no acute distress, normal vital signs afebrile.  Soft benign abdominal exam to palpation all quadrants without rigidity or guarding no significant CVA tenderness   ED Results / Procedures / Treatments   Labs (all labs ordered are listed, but only abnormal results are displayed) Labs Reviewed  URINALYSIS, ROUTINE W REFLEX MICROSCOPIC - Abnormal; Notable for the following components:      Result Value   Color, Urine YELLOW (*)    APPearance CLOUDY (*)    Hgb urine dipstick LARGE (*)    Protein, ur 100 (*)    Leukocytes,Ua LARGE (*)    Bacteria, UA  FEW (*)    All other components within normal limits     I ordered and reviewed the above labs they are notable for urine with bacteria leukocytes and blood    PROCEDURES:  Critical Care performed: No  Procedures   MEDICATIONS ORDERED IN ED: Medications  cephALEXin  (KEFLEX ) capsule 500 mg (has no administration in time range)    IMPRESSION / MDM / ASSESSMENT AND PLAN / ED COURSE  I reviewed the triage vital signs and the nursing notes.                                Patient's presentation is most consistent with acute presentation with potential threat to life or bodily function.  Differential diagnosis includes, but is not limited to, lower urinary tract infection, pyelonephritis, sepsis, renal colic, obstructive uropathy, intra-abdominal infection or obstruction less likely    MDM:    Well-appearing patient with lower urinary tract cystitis/urethritis symptoms only, without urinalysis suggestive of urinary  tract infection.  Doubt sepsis or pyelonephritis given her overall well appearance no flank pain.  Previous urine culture showed pansensitive E. coli, will start with cephalexin  first dose in the Emergency Department and prescription for the same.  Looks well enough for outpatient management follow-up PMD.       FINAL CLINICAL IMPRESSION(S) / ED DIAGNOSES   Final diagnoses:  Lower urinary tract infectious disease     Rx / DC Orders   ED Discharge Orders          Ordered    cephALEXin  (KEFLEX ) 500 MG capsule  4 times daily        07/06/24 9360             Note:  This document was prepared using Dragon voice recognition software and may include unintentional dictation errors.    Cyrena Mylar, MD 07/06/24 [phone]

## 2024-07-06 NOTE — ED Triage Notes (Signed)
 Patient ambulatory to triage with complaints UTI symptoms including pink tinged, cloudy urine and frequency. Patient denies fevers, vomiting, or flank pain.

## 2024-07-06 NOTE — ED Notes (Signed)
Patient given discharge instructions including prescriptions x1 and importance of follow up appt with stated understanding. Patient stable and ambulatory with steady even gait on dispo.

## 2024-07-18 NOTE — Procedures (Signed)
 [facility] Brief Op Note  Pre-procedure Diagnosis: Ureteral stricture  Procedure: Right percutaneous nephrostomy tube exchange  Medications:  cefTRIAXone  (ROCEPHIN ) 1 g in sodium chloride  0.9 % 100 mL IVPB - Not documented*  *Total volume has not been documented. View each administration to see the amount administered. promethazine  (PHENERGAN ) injection - 25 mg midazolam  (VERSED ) 1 mg/mL injection - 1 mg fentaNYL  (PF) (SUBLIMAZE ) injection - 50 mcg (Totals for administrations occurring from 0841 to 0909 on 07/18/24)  Complications: None immediate.  EBL: minimal  Specimen Removed: No  Findings: 1.  Successful 10 French right percutaneous nephrostomy tube exchange.   2.  At the patient's request no retention suture was placed.  This has not been performed for several years and there have been no issues with tube dislodgment.  Post-procedure Diagnosis: Same  Plan: 1. Routine tube exchange in 12 weeks.  Interventional Fellow: Rankin Big; [id number]  Interventional Attending: Yvonna Rosella, MD

## 2024-07-18 NOTE — H&P (Signed)
 [facility] Pre-procedure H&P  Demographics:Patricia Rojas is a 58 y.o. female Procedure requested: Right PCN exchange Indication: Routine Sedation plan:  Moderate sedation Patient prior anesthesia or moderate sedation: Yes-Well tolerated Code status: Full code DNR suspended for procedure:: Not applicable NPO: Currently NPO Precautions: None No Known Allergies Patient allergic to iodinated contrast: No  Current Outpatient Medications  Medication Sig Dispense Refill  . acetaminophen  (TYLENOL ) 500 MG tablet Take by mouth as needed for Pain.    . ALPRAZolam  (XANAX ) 0.25 MG tablet Take 0.25 mg by mouth as needed. 1-2 tabs daily as needed    . amLODIPine  (NORVASC ) 2.5 MG tablet Take 2.5 mg by mouth once daily.    . promethazine  (PHENERGAN ) 25 MG suppository Place 25 mg rectally every 6 (six) hours as needed for Nausea.     No current facility-administered medications for this encounter.   Past Medical History:  Diagnosis Date  . Cervical cancer (CMS-HCC)   . History of motion sickness   . PONV (postoperative nausea and vomiting)    severe PONV   Patient Active Problem List  Diagnosis  . Malignant neoplasm of overlapping sites of cervix (CMS/HHS-HCC)  . Morbid obesity with BMI of 40.0-44.9 (HCC)  . Pyelonephritis  . Ureteral stricture  . Hydronephrosis, right, unspecified, unspecified   No pertinent findings on review of symptoms.  No data found. There were no vitals filed for this visit.  PHYSICAL EXAM:  General: No acute distress Mental Status Examination: Alert Respiratory Examination: Non-labored breathing Airway Examination: II (hard and soft palate, upper portion of tonsils and uvula visible) Cardiovascular: Acyanotic Neurologic: No gross deficit   No results for input(s): CBC, INR, PLT, CREATININE in the last 168 hours.  Prior Imaging: Relevant prior imaging reviewed.  Procedure: -- PCN exchange. Site has not been marked because it's not  applicable to this case. This will be completed today. This procedure has been fully reviewed with the patient, and written informed consent has been obtained. --Sedation: ASA Grade Assessment: 1 - A normal, healthy patient. Moderate sedation is appropriate for this patient at this time.  --Implants to be placed: Availability of relevant implants confirmed  Pre-Procedure Planning:  --Antibiotics: The patient will require Ceftriaxone  to be administered in VIR immediately prior to the procedure.  --Anticoagulation: N/A - Blood products: Transfusion not anticipated. - Code status: Full code - DNR suspended for procedure: Not applicable - NPO: Currently NPO  Post-procedure care: Outpatient to be discharged from IRU.  Plan above was discussed with VIR attending Yvonna Rosella MD    Patricia EARNIE LYELL, MD

## 2024-08-26 ENCOUNTER — Ambulatory Visit: Admitting: Pulmonary Disease

## 2024-08-26 ENCOUNTER — Ambulatory Visit
Admission: RE | Admit: 2024-08-26 | Discharge: 2024-08-26 | Disposition: A | Discharge status: Home / Self Care | Attending: Pulmonary Disease | Admitting: Pulmonary Disease

## 2024-08-26 ENCOUNTER — Encounter: Payer: Self-pay | Admitting: Pulmonary Disease

## 2024-08-26 ENCOUNTER — Ambulatory Visit
Admission: RE | Admit: 2024-08-26 | Discharge: 2024-08-26 | Disposition: A | Discharge status: Home / Self Care | Source: Ambulatory Visit | Attending: Pulmonary Disease | Admitting: Pulmonary Disease

## 2024-08-26 VITALS — BP 126/70 | HR 85 | Temp 97.6°F | Ht 65.0 in | Wt 265.3 lb

## 2024-08-26 DIAGNOSIS — J42 Unspecified chronic bronchitis: Secondary | ICD-10-CM

## 2024-08-26 DIAGNOSIS — J4540 Moderate persistent asthma, uncomplicated: Secondary | ICD-10-CM | POA: Diagnosis not present

## 2024-08-26 DIAGNOSIS — J441 Chronic obstructive pulmonary disease with (acute) exacerbation: Secondary | ICD-10-CM

## 2024-08-26 DIAGNOSIS — R0602 Shortness of breath: Secondary | ICD-10-CM

## 2024-08-26 DIAGNOSIS — J329 Chronic sinusitis, unspecified: Secondary | ICD-10-CM | POA: Diagnosis not present

## 2024-08-26 DIAGNOSIS — Z23 Encounter for immunization: Secondary | ICD-10-CM

## 2024-08-26 DIAGNOSIS — Z6841 Body Mass Index (BMI) 40.0 and over, adult: Secondary | ICD-10-CM

## 2024-08-26 LAB — PULMONARY FUNCTION TEST
DL/VA % pred: 112 %
DL/VA: 4.73 ml/min/mmHg/L
DLCO unc % pred: 107 %
DLCO unc: 22.55 ml/min/mmHg
FEF 25-75 Post: 3.02 L/s
FEF 25-75 Pre: 1.79 L/s
FEF2575-%Change-Post: 69 %
FEF2575-%Pred-Post: 121 %
FEF2575-%Pred-Pre: 71 %
FEV1-%Change-Post: 16 %
FEV1-%Pred-Post: 93 %
FEV1-%Pred-Pre: 79 %
FEV1-Post: 2.52 L
FEV1-Pre: 2.16 L
FEV1FVC-%Change-Post: 0 %
FEV1FVC-%Pred-Pre: 97 %
FEV6-%Change-Post: 15 %
FEV6-%Pred-Post: 96 %
FEV6-%Pred-Pre: 83 %
FEV6-Post: 3.25 L
FEV6-Pre: 2.82 L
FEV6FVC-%Change-Post: -1 %
FEV6FVC-%Pred-Post: 102 %
FEV6FVC-%Pred-Pre: 103 %
FVC-%Change-Post: 16 %
FVC-%Pred-Post: 94 %
FVC-%Pred-Pre: 80 %
FVC-Post: 3.28 L
FVC-Pre: 2.82 L
Post FEV1/FVC ratio: 77 %
Post FEV6/FVC ratio: 99 %
Pre FEV1/FVC ratio: 77 %
Pre FEV6/FVC Ratio: 100 %
RV % pred: 119 %
RV: 2.38 L
TLC % pred: 105 %
TLC: 5.48 L

## 2024-08-26 LAB — NITRIC OXIDE: Nitric Oxide: 6

## 2024-08-26 MED ORDER — BUDESONIDE-FORMOTEROL FUMARATE 160-4.5 MCG/ACT IN AERO
2.0000 | INHALATION_SPRAY | Freq: Two times a day (BID) | RESPIRATORY_TRACT | 6 refills | Status: AC
Start: 2024-08-26 — End: ?

## 2024-08-26 NOTE — Progress Notes (Signed)
 Subjective:    Patient ID: Patricia Rojas, female    DOB: 1966/06/02, 58 y.o.   MRN: [id number]  Patient Care Team: Leavy Mole, PA-C as PCP - General [facility])  Chief Complaint  Patient presents with   Follow-up    Occasional wheezing. Cough with yellow phlegm.    BACKGROUND/INTERVAL:Patient is a 58 year old remote former smoker who presents for follow-up of recurrent bronchitis with bouts once to twice a year.  She was initially evaluated on 25 June 2024.  She had PFTs today.  HPI Discussed the use of AI scribe software for clinical note transcription with the patient, who gave verbal consent to proceed.  History of Present Illness   Patricia Rojas is a 58 year old female with asthma who presents for follow-up on cough and wheezing.  She experiences cough and wheezing. She is currently using Symbicort  as a maintenance inhaler at a dose of two puffs twice daily and albuterol  as a rescue inhaler.  She mentions a recent episode of illness last week, which she suspects was bronchitis. During this time, she utilized breathing treatments and a nebulizer. However, she indicates that the nebulizer she purchased was inadequate and expresses a need for a more reliable machine.     She had PFTs today which were consistent with obstructive lung disease asthmatic type given the significant response to bronchodilators.  DATA 08/26/2024 PFTs: FEV1 2.16 L or 79% predicted, FVC 2.82 L or 80% predicted, FEV1/FVC 77%, significant bronchodilator response is noted.  Lung volumes show mild air trapping.  Diffusion capacity normal.  Consistent with mild obstruction of the asthmatic type.  Review of Systems A 10 point review of systems was performed and it is as noted above otherwise negative.   Patient Active Problem List   Diagnosis Date Noted   Nephrostomy present (HCC) 04/21/2024   Status post laparoscopic herniorrhaphy 12/25/2023   Adenomatous polyp of colon 01/12/2023   Renal insufficiency  11/20/2022   Dyslipidemia 11/20/2022   Other artificial openings of urinary tract status (HCC) 10/19/2022   Primary hypertension 10/19/2022   Hydronephrosis, right 05/16/2018   Ureteral stricture 05/15/2018   Acute kidney injury 09/10/2017   UTI (urinary tract infection) 08/12/2017   Morbid obesity (HCC) 03/07/2017   Malignant neoplasm of overlapping sites of cervix (HCC) 03/06/2017   Screening for colon cancer 02/18/2017   Malignant neoplasm of cervix (HCC) 02/07/2017    Social History   Tobacco Use   Smoking status: Former    Current packs/day: 0.00    Types: Cigarettes    Quit date: 1990    Years since quitting: 35.7    Passive exposure: Past   Smokeless tobacco: Never   Tobacco comments:    Prior smoker casual smoker in teens to 20's when she had her kid, none for 30 years  Substance Use Topics   Alcohol use: No    No Known Allergies  Current Meds  Medication Sig   albuterol  (VENTOLIN  HFA) 108 (90 Base) MCG/ACT inhaler Inhale 2 puffs into the lungs every 6 (six) hours as needed for wheezing or shortness of breath.   aspirin EC 81 MG tablet Take 81 mg by mouth daily. Swallow whole.   budesonide -formoterol  (SYMBICORT ) 160-4.5 MCG/ACT inhaler Inhale 2 puffs into the lungs in the morning and at bedtime. After use rinse out mouth (swish and spit)   docusate sodium  (COLACE) 250 MG capsule Take 250 mg by mouth daily.   ipratropium-albuterol  (DUONEB) 0.5-2.5 (3) MG/3ML SOLN Take 3 mLs  by nebulization 3 (three) times daily as needed (bronchitis exacerbations, wheeze, cough, SOB).   mometasone  (NASONEX ) 50 MCG/ACT nasal spray Place 1 spray into the nose 2 (two) times daily. (Patient taking differently: Place 1 spray into the nose as needed.)   Respiratory Therapy Supplies (NEBULIZER/TUBING/MOUTHPIECE) KIT Disp one nebulizer machine, tubing set and mouthpiece kit   [DISCONTINUED] budesonide -formoterol  (SYMBICORT ) 160-4.5 MCG/ACT inhaler Inhale 2 puffs into the lungs in the morning  and at bedtime. After use rinse out mouth (swish and spit) (Patient taking differently: Inhale 2 puffs into the lungs as needed. After use rinse out mouth (swish and spit))    Immunization History  Administered Date(s) Administered   Influenza, Seasonal, Injecte, Preservative Fre 08/26/2024   Influenza,inj,Quad PF,6+ Mos 10/19/2022   PFIZER(Purple Top)SARS-COV-2 Vaccination 12/24/2020   Tdap 10/19/2022        Objective:     BP 126/70   Pulse 85   Temp 97.6 F (36.4 C) (Temporal)   Ht 5' 5 (1.651 m)   Wt 265 lb 5.1 oz (120.3 kg)   LMP 12/18/2016   SpO2 98%   BMI 44.15 kg/m   SpO2: 98 %  GENERAL: Morbidly obese woman, no acute distress, fully ambulatory, no conversational dyspnea. HEAD: Normocephalic, atraumatic.  EYES: Pupils equal, round, reactive to light.  No scleral icterus.  MOUTH: Few missing teeth, poor dentition, oral mucosa moist.  No thrush. NECK: Supple. No thyromegaly. Trachea midline. No JVD.  No adenopathy. PULMONARY: Good air entry bilaterally.  Coarse, otherwise, no adventitious sounds. CARDIOVASCULAR: S1 and S2. Regular rate and rhythm.  No rubs, murmurs or gallops heard. ABDOMEN: Obese, otherwise benign. MUSCULOSKELETAL: No joint deformity, no clubbing, no edema.  NEUROLOGIC: No overt focal deficit, no gait disturbance, speech is fluent. SKIN: Intact,warm,dry. PSYCH: Mood and behavior normal.   Recent Results (from the past 2160 hours)  Pulmonary function test     Status: None (Preliminary result)   Collection Time: 08/26/24  2:04 PM  Result Value Ref Range   FVC-Pre 2.82 L   FVC-%Pred-Pre 80 %   FVC-Post 3.28 L   FVC-%Pred-Post 94 %   FVC-%Change-Post 16 %   FEV1-Pre 2.16 L   FEV1-%Pred-Pre 79 %   FEV1-Post 2.52 L   FEV1-%Pred-Post 93 %   FEV1-%Change-Post 16 %   FEV6-Pre 2.82 L   FEV6-%Pred-Pre 83 %   FEV6-Post 3.25 L   FEV6-%Pred-Post 96 %   FEV6-%Change-Post 15 %   Pre FEV1/FVC ratio 77 %   FEV1FVC-%Pred-Pre 97 %   Post FEV1/FVC  ratio 77 %   FEV1FVC-%Change-Post 0 %   Pre FEV6/FVC Ratio 100 %   FEV6FVC-%Pred-Pre 103 %   Post FEV6/FVC ratio 99 %   FEV6FVC-%Pred-Post 102 %   FEV6FVC-%Change-Post -1 %   FEF 25-75 Pre 1.79 L/sec   FEF2575-%Pred-Pre 71 %   FEF 25-75 Post 3.02 L/sec   FEF2575-%Pred-Post 121 %   FEF2575-%Change-Post 69 %   RV 2.38 L   RV % pred 119 %   TLC 5.48 L   TLC % pred 105 %   DLCO unc 22.55 ml/min/mmHg   DLCO unc % pred 107 %   DL/VA 5.26 ml/min/mmHg/L   DL/VA % pred 887 %  Nitric oxide      Status: None   Collection Time: 08/26/24  3:41 PM  Result Value Ref Range   Nitric Oxide  6   *Lab data discussed with patient.  Lab Results  Component Value Date   NITRICOXIDE 6 08/26/2024  *No significant presence  of type II inflammation noted.    Assessment & Plan:     ICD-10-CM   1. Shortness of breath  R06.02 Nitric oxide     AMB REFERRAL FOR DME    2. Moderate persistent asthma without complication  J45.40 AMB REFERRAL FOR DME    3. Chronic rhinosinusitis  J32.9     4. Morbid obesity (HCC)  E66.01     5. Need for influenza vaccination  Z23 Flu vaccine trivalent PF, 6mos and older(Flulaval,Afluria,Fluarix,Fluzone)    6. COPD with acute exacerbation (HCC)  J44.1 budesonide -formoterol  (SYMBICORT ) 160-4.5 MCG/ACT inhaler   tx with mucinex, other OTC cough meds that help, steroid burst, use inhaler more frequently, add symbicort     7. Chronic bronchitis, unspecified chronic bronchitis type (HCC)  J42 budesonide -formoterol  (SYMBICORT ) 160-4.5 MCG/ACT inhaler   she is pending pulm consult later this week, with amount of wheezing feel it may be helpful to start her on maintenence inhaler/long lasting pulm meds      Orders Placed This Encounter  Procedures   Flu vaccine trivalent PF, 6mos and older(Flulaval,Afluria,Fluarix,Fluzone)   AMB REFERRAL FOR DME    Referral Priority:   Routine    Referral Type:   Durable Medical Equipment Purchase    Number of Visits Requested:   1    Nitric oxide     Meds ordered this encounter  Medications   budesonide -formoterol  (SYMBICORT ) 160-4.5 MCG/ACT inhaler    Sig: Inhale 2 puffs into the lungs in the morning and at bedtime. After use rinse out mouth (swish and spit)    Dispense:  1 each    Refill:  6   Discussion:    Late-onset nonallergic (LONA) asthma Confirmed by a 16% improvement in lung function post-bronchodilator, indicating reversible airway obstruction. Inflammation levels are reduced. Symbicort  is the mainstay treatment, with albuterol  as a rescue inhaler. Emphasized the importance of regular use of Symbicort , two puffs twice daily, regardless of symptom presence. Discussed the need to rinse mouth after use. Bronchitis exacerbations have required nebulizer treatments. - Instruct to use Symbicort  two puffs twice daily. - Instruct to use albuterol  as a rescue inhaler. - Order a nebulizer machine through a medical supply company. - Check airway inflammation levels.  Obesity Contributing factor to respiratory issues. Weight loss recommended to improve lung function and overall health. - Recommend weight loss to improve respiratory function.     Advised if symptoms do not improve or worsen, to please contact office for sooner follow up or seek emergency care.    I spent 40 minutes of dedicated to the care of this patient on the date of this encounter to include pre-visit review of records, face-to-face time with the patient discussing conditions above, post visit ordering of testing, clinical documentation with the electronic health record, making appropriate referrals as documented, and communicating necessary findings to members of the patients care team.     C. Leita Sanders, MD Advanced [facility]ch[facility]    *This note was generated using voice recognition software/Dragon and/or AI transcription program.  Despite best efforts to proofread, errors can occur which can change the  meaning. Any transcriptional errors that result from this process are unintentional and may not be fully corrected at the time of dictation.

## 2024-08-26 NOTE — Patient Instructions (Signed)
 VISIT SUMMARY:  Today, we discussed your ongoing asthma symptoms, including cough and wheezing, and reviewed your current treatment plan. We also talked about your recent illness and the need for a more reliable nebulizer machine. Additionally, we addressed the impact of weight on your respiratory health.  YOUR PLAN:  -LATE-ONSET NONALLERGIC ASTHMA: This type of asthma is characterized by airway obstruction that improves with medication. You should continue using Symbicort , two puffs twice daily, and albuterol  as a rescue inhaler. It's important to rinse your mouth after using Symbicort . We will order a new nebulizer machine for you to use during bronchitis exacerbations and check your airway inflammation levels.  -OBESITY: Excess weight can contribute to respiratory issues. We recommend weight loss to help improve your lung function and overall health.  INSTRUCTIONS:  Please continue using Symbicort  as directed, two puffs twice daily, and albuterol  as needed for rescue. We will order a nebulizer machine for you. Additionally, focus on weight loss to improve your respiratory function. Follow up with us  to check your airway inflammation levels.

## 2024-08-26 NOTE — Progress Notes (Signed)
 Full PFT completed today ? ?

## 2024-08-26 NOTE — Patient Instructions (Signed)
 Full PFT completed today ? ?

## 2024-09-13 ENCOUNTER — Encounter: Payer: Self-pay | Admitting: Pulmonary Disease

## 2024-10-01 ENCOUNTER — Other Ambulatory Visit: Payer: Self-pay | Admitting: Pulmonary Disease

## 2024-10-17 ENCOUNTER — Encounter: Admitting: Family Medicine

## 2024-11-10 ENCOUNTER — Ambulatory Visit

## 2024-11-10 VITALS — Ht 65.0 in | Wt 265.0 lb

## 2024-11-10 DIAGNOSIS — Z0000 Encounter for general adult medical examination without abnormal findings: Secondary | ICD-10-CM | POA: Diagnosis not present

## 2024-11-10 NOTE — Patient Instructions (Addendum)
 Patricia Rojas,  Thank you for taking the time for your Medicare Wellness Visit. I appreciate your continued commitment to your health goals. Please review the care plan we discussed, and feel free to reach out if I can assist you further.  Please note that Annual Wellness Visits do not include a physical exam. Some assessments may be limited, especially if the visit was conducted virtually. If needed, we may recommend an in-person follow-up with your provider.  Ongoing Care Seeing your primary care provider every 3 to 6 months helps us  monitor your health and provide consistent, personalized care. Next office visit on 01/23/2025.  You are due for a Shingles vaccine and a Hep B vaccine, please discuss with provider during your up coming visit.  You are also due for a pneumonia vaccine, which you could discuss with your pulmonary provider during your next visit there.    Referrals If a referral was made during today's visit and you haven't received any updates within two weeks, please contact the referred provider directly to check on the status.  Recommended Screenings:  Health Maintenance  Topic Date Due   Medicare Annual Wellness Visit  Never done   Pneumococcal Vaccine for age over 22 (1 of 2 - PCV) Never done   Hepatitis B Vaccine (1 of 3 - 19+ 3-dose series) Never done   Zoster (Shingles) Vaccine (1 of 2) Never done   COVID-19 Vaccine (2 - Pfizer risk series) 01/14/2021   Breast Cancer Screening  12/21/2024   Colon Cancer Screening  01/12/2030   DTaP/Tdap/Td vaccine (2 - Td or Tdap) 10/19/2032   Flu Shot  Completed   Hepatitis C Screening  Completed   HIV Screening  Completed   HPV Vaccine  Aged Out   Meningitis B Vaccine  Aged Out       11/06/2024    8:52 AM  Advanced Directives  Does Patient Have a Medical Advance Directive? No    Vision: Annual vision screenings are recommended for early detection of glaucoma, cataracts, and diabetic retinopathy. These exams can also reveal  signs of chronic conditions such as diabetes and high blood pressure.  Dental: Annual dental screenings help detect early signs of oral cancer, gum disease, and other conditions linked to overall health, including heart disease and diabetes.  Please see the attached documents for additional preventive care recommendations.

## 2024-11-10 NOTE — Progress Notes (Signed)
 Chief Complaint  Patient presents with   Medicare Wellness     Subjective:   Patricia Rojas is a 58 y.o. female who presents for a Medicare Annual Wellness Visit.  Allergies (verified) Patient has no known allergies.   History: Past Medical History:  Diagnosis Date   Anxiety    Cervical cancer (HCC) 2018   internal rad tx's and chemo tx's.    Constipation    Depression    GERD (gastroesophageal reflux disease)    Hemorrhoids    Hypertension    Incarcerated hernia 12/08/2023   N&V (nausea and vomiting)    PONV (postoperative nausea and vomiting)    PT RECEIVED SCOPLAMINE PATCH FOR 01-09-18 SURGERY AND SHE HAD NO N/V   Pyelonephritis 06/27/2017   Renal disorder    Past Surgical History:  Procedure Laterality Date   CESAREAN SECTION  1986   CHOLECYSTECTOMY     COLONOSCOPY WITH PROPOFOL  N/A 01/12/2023   Procedure: COLONOSCOPY WITH PROPOFOL ;  Surgeon: Therisa Bi, MD;  Location: [facility];  Service: Gastroenterology;  Laterality: N/A;   CYSTOSCOPY W/ RETROGRADES Right 03/25/2018   Procedure: CYSTOSCOPY WITH RETROGRADE PYELOGRAM;  Surgeon: Penne Knee, MD;  Location: [facility];  Service: Urology;  Laterality: Right;   CYSTOSCOPY W/ URETERAL STENT PLACEMENT Right 03/25/2018   Procedure: CYSTOSCOPY WITH STENT REPLACEMENT;  Surgeon: Penne Knee, MD;  Location: [facility];  Service: Urology;  Laterality: Right;   HERNIA REPAIR     INSERTION OF MESH  12/08/2023   Procedure: INSERTION OF MESH;  Surgeon: Lane Shope, MD;  Location: [facility];  Service: General;;   IR GENERIC HISTORICAL  02/23/2017   IR NEPHROSTOMY PLACEMENT RIGHT 02/23/2017 Toribio Faes, MD [facility]   IR NEPHROSTOGRAM RIGHT THRU EXISTING ACCESS  02/15/2018   IR NEPHROSTOMY EXCHANGE RIGHT  04/18/2017   IR NEPHROSTOMY EXCHANGE RIGHT  06/08/2017   IR NEPHROSTOMY EXCHANGE RIGHT  08/17/2017   IR NEPHROSTOMY EXCHANGE RIGHT  10/05/2017   IR NEPHROSTOMY EXCHANGE RIGHT  12/28/2017   IR NEPHROSTOMY PLACEMENT  RIGHT  06/05/2018   IR URETERAL STENT PERC REMOVAL MOD SED  06/05/2018   IR URETERAL STENT PLACEMENT EXISTING ACCESS RIGHT  02/15/2018   VULVA MARYBETH BIOPSY N/A 01/09/2018   Procedure: VAGINAL  AND CERVIX BIOPSY;  Surgeon: Mancil Barter, MD;  Location: [facility];  Service: Gynecology;  Laterality: N/A;   XI ROBOTIC ASSISTED VENTRAL HERNIA N/A 12/08/2023   Procedure: XI ROBOTIC ASSISTED VENTRAL HERNIA;  Surgeon: Lane Shope, MD;  Location: [facility];  Service: General;  Laterality: N/A;   Family History  Problem Relation Age of Onset   Arthritis Mother    COPD Mother    Heart disease Father    Obesity Father    Diabetes Brother    Cancer Maternal Uncle    Cancer Paternal Aunt    Kidney cancer Neg Hx    Bladder Cancer Neg Hx    Social History   Occupational History   Occupation: Disablied  Tobacco Use   Smoking status: Former    Current packs/day: 0.00    Types: Cigarettes    Quit date: 1990    Years since quitting: 35.9    Passive exposure: Past   Smokeless tobacco: Never   Tobacco comments:    Prior smoker casual smoker in teens to 20's when she had her kid, none for 30 years  Vaping Use   Vaping status: Never Used  Substance and Sexual Activity   Alcohol use: No  Drug use: Yes    Types: Marijuana    Comment: every night 04/11/2024   Sexual activity: Not Currently   Tobacco Counseling Counseling given: Not Answered Tobacco comments: Prior smoker casual smoker in teens to 20's when she had her kid, none for 30 years  SDOH Screenings   Food Insecurity: Food Insecurity Present (11/06/2024)  Housing: High Risk (11/06/2024)  Transportation Needs: No Transportation Needs (11/06/2024)  Utilities: Not At Risk (04/11/2024)  Depression (PHQ2-9): Low Risk  (11/10/2024)  Financial Resource Strain: Medium Risk (11/06/2024)  Physical Activity: Insufficiently Active (11/06/2024)  Social Connections: Socially Isolated (11/06/2024)  Stress: Stress Concern Present (11/06/2024)   Tobacco Use: Medium Risk (11/10/2024)  Health Literacy: Adequate Health Literacy (04/11/2024)   See flowsheets for full screening details  Depression Screen PHQ 2 & 9 Depression Scale- Over the past 2 weeks, how often have you been bothered by any of the following problems? Little interest or pleasure in doing things: 0 Feeling down, depressed, or hopeless (PHQ Adolescent also includes...irritable): 0 PHQ-2 Total Score: 0 Trouble falling or staying asleep, or sleeping too much: 1 (staying asleep) Feeling tired or having little energy: 0 Poor appetite or overeating (PHQ Adolescent also includes...weight loss): 0 Feeling bad about yourself - or that you are a failure or have let yourself or your family down: 0 Trouble concentrating on things, such as reading the newspaper or watching television (PHQ Adolescent also includes...like school work): 0 Moving or speaking so slowly that other people could have noticed. Or the opposite - being so fidgety or restless that you have been moving around a lot more than usual: 0 Thoughts that you would be better off dead, or of hurting yourself in some way: 0 PHQ-9 Total Score: 1 If you checked off any problems, how difficult have these problems made it for you to do your work, take care of things at home, or get along with other people?: Not difficult at all  Depression Treatment Depression Interventions/Treatment : EYV7-0 Score <4 Follow-up Not Indicated     Goals Addressed               This Visit's Progress     Weight (lb) < 200 lb (90.7 kg) (pt-stated)   265 lb (120.2 kg)      Visit info / Clinical Intake: Medicare Wellness Visit Type:: Initial Annual Wellness Visit Persons participating in visit:: patient Medicare Wellness Visit Mode:: Video Because this visit was a virtual/telehealth visit:: unable to obtan vitals due to lack of equipment If Telephone or Video please confirm:: I connected with the patient using audio/video enabled  telemedicine application and verified that I am speaking with the correct person using two identifiers; I discussed the limitations of evaluation and management by telemedicine; The patient expressed understanding and agreed to proceed Patient Location:: home Provider Location:: Home Information given by:: patient Interpreter Needed?: No Pre-visit prep was completed: no AWV questionnaire completed by patient prior to visit?: yes Date:: 11/06/24 Living arrangements:: (!) lives alone Patient's Overall Health Status Rating: (!) fair Typical amount of pain: some Does pain affect daily life?: (!) yes (sitting too long and standing too long) Are you currently prescribed opioids?: no  Dietary Habits and Nutritional Risks How many meals a day?: 3 Eats fruit and vegetables daily?: (!) no Most meals are obtained by: preparing own meals In the last 2 weeks, have you had any of the following?: none Diabetic:: no  Functional Status Activities of Daily Living (to include ambulation/medication): (Patient-Rptd) Independent Ambulation:  Independent with device- listed below Home Assistive Devices/Equipment: Eyeglasses Medication Administration: Independent Home Management: (Patient-Rptd) Independent Manage your own finances?: yes Primary transportation is: driving Concerns about vision?: no *vision screening is required for WTM* Concerns about hearing?: (!) yes Uses hearing aids?: (!) yes (wears hearing aide in rt ear) Hear whispered voice?: yes  Fall Screening Falls in the past year?: (Patient-Rptd) 0 Number of falls in past year: (Patient-Rptd) 0 Was there an injury with Fall?: (Patient-Rptd) 0 Fall Risk Category Calculator: (Patient-Rptd) 0 Patient Fall Risk Level: (Patient-Rptd) Low Fall Risk  Fall Risk Patient at Risk for Falls Due to: No Fall Risks Fall risk Follow up: Falls evaluation completed; Falls prevention discussed  Home and Transportation Safety: All rugs have non-skid  backing?: yes All stairs or steps have railings?: (!) no (steps at front door) Grab bars in the bathtub or shower?: yes Have non-skid surface in bathtub or shower?: yes Good home lighting?: yes Regular seat belt use?: yes Hospital stays in the last year:: no  Cognitive Assessment Difficulty concentrating, remembering, or making decisions? : no Will 6CIT or Mini Cog be Completed: no 6CIT or Mini Cog Declined: patient alert, oriented, able to answer questions appropriately and recall recent events  Advance Directives (For Healthcare) Does Patient Have a Medical Advance Directive?: No  Reviewed/Updated  Reviewed/Updated: Reviewed All (Medical, Surgical, Family, Medications, Allergies, Care Teams, Patient Goals)        Objective:    Today's Vitals   11/10/24 1053  Weight: 265 lb (120.2 kg)  Height: 5' 5 (1.651 m)   Body mass index is 44.1 kg/m.  Current Medications (verified) Outpatient Encounter Medications as of 11/10/2024  Medication Sig   albuterol  (VENTOLIN  HFA) 108 (90 Base) MCG/ACT inhaler Inhale 2 puffs into the lungs every 6 (six) hours as needed for wheezing or shortness of breath.   aspirin EC 81 MG tablet Take 81 mg by mouth daily. Swallow whole.   budesonide -formoterol  (SYMBICORT ) 160-4.5 MCG/ACT inhaler Inhale 2 puffs into the lungs in the morning and at bedtime. After use rinse out mouth (swish and spit)   docusate sodium  (COLACE) 250 MG capsule Take 250 mg by mouth daily.   ipratropium-albuterol  (DUONEB) 0.5-2.5 (3) MG/3ML SOLN Take 3 mLs by nebulization 3 (three) times daily as needed (bronchitis exacerbations, wheeze, cough, SOB).   mometasone  (NASONEX ) 50 MCG/ACT nasal spray PLACE 1 SPRAY INTO THE NOSE 2 (TWO) TIMES DAILY.   Respiratory Therapy Supplies (NEBULIZER/TUBING/MOUTHPIECE) KIT Disp one nebulizer machine, tubing set and mouthpiece kit   No facility-administered encounter medications on file as of 11/10/2024.   Hearing/Vision screen Hearing  Screening - Comments:: wears hearing aide in rt ear Vision Screening - Comments:: Wears eyeglasses/[facility]/UTD Immunizations and Health Maintenance Health Maintenance  Topic Date Due   Pneumococcal Vaccine: 50+ Years (1 of 2 - PCV) Never done   Hepatitis B Vaccines 19-59 Average Risk (1 of 3 - 19+ 3-dose series) Never done   Zoster Vaccines- Shingrix (1 of 2) Never done   COVID-19 Vaccine (2 - Pfizer risk series) 01/14/2021   Mammogram  12/21/2024   Medicare Annual Wellness (AWV)  11/10/2025   Colonoscopy  01/12/2030   DTaP/Tdap/Td (2 - Td or Tdap) 10/19/2032   Influenza Vaccine  Completed   Hepatitis C Screening  Completed   HIV Screening  Completed   HPV VACCINES  Aged Out   Meningococcal B Vaccine  Aged Out        Assessment/Plan:  This is a routine wellness examination for  Patricia Rojas.  Patient Care Team: Leavy Mole, PA-C (Inactive) as PCP - General (Family Medicine) Pa, [facility])  I have personally reviewed and noted the following in the patient's chart:   Medical and social history Use of alcohol, tobacco or illicit drugs  Current medications and supplements including opioid prescriptions. Functional ability and status Nutritional status Physical activity Advanced directives List of other physicians Hospitalizations, surgeries, and ER visits in previous 12 months Vitals Screenings to include cognitive, depression, and falls Referrals and appointments  No orders of the defined types were placed in this encounter.  In addition, I have reviewed and discussed with patient certain preventive protocols, quality metrics, and best practice recommendations. A written personalized care plan for preventive services as well as general preventive health recommendations were provided to patient.   Deyvi Bonanno L Sidnee Gambrill, CMA   11/10/2024   Return in 1 year (on 11/10/2025).  After Visit Summary: (MyChart) Due to this being a telephonic visit, the after visit  summary with patients personalized plan was offered to patient via MyChart   Nurse Notes: Patient is due for a shingles vaccine, pneumonia vaccine and Hep B vaccine.  She had no other concerns to address today.

## 2024-11-14 ENCOUNTER — Encounter: Admitting: Family Medicine

## 2025-01-11 ENCOUNTER — Other Ambulatory Visit: Payer: Self-pay | Admitting: Pulmonary Disease

## 2025-01-23 ENCOUNTER — Encounter: Admitting: Internal Medicine

## 2025-02-13 ENCOUNTER — Encounter: Admitting: Nurse Practitioner
